# Patient Record
Sex: Male | Born: 1960 | Hispanic: No | Marital: Married | Smoking: Former smoker
Health system: Southern US, Community
[De-identification: ages and names within clinical notes are randomized; demographics above are authoritative.]

## PROBLEM LIST (undated history)

## (undated) ENCOUNTER — Emergency Department (HOSPITAL_COMMUNITY): Admission: EM | Payer: Medicare Other | Source: Home / Self Care

## (undated) ENCOUNTER — Emergency Department (HOSPITAL_COMMUNITY): Admission: EM | Payer: Self-pay | Source: Home / Self Care

## (undated) DIAGNOSIS — D649 Anemia, unspecified: Secondary | ICD-10-CM

## (undated) DIAGNOSIS — B029 Zoster without complications: Secondary | ICD-10-CM

## (undated) DIAGNOSIS — D560 Alpha thalassemia: Secondary | ICD-10-CM

## (undated) DIAGNOSIS — K2970 Gastritis, unspecified, without bleeding: Secondary | ICD-10-CM

## (undated) DIAGNOSIS — K047 Periapical abscess without sinus: Secondary | ICD-10-CM

## (undated) DIAGNOSIS — K219 Gastro-esophageal reflux disease without esophagitis: Secondary | ICD-10-CM

## (undated) DIAGNOSIS — I10 Essential (primary) hypertension: Secondary | ICD-10-CM

## (undated) DIAGNOSIS — I509 Heart failure, unspecified: Secondary | ICD-10-CM

## (undated) HISTORY — PX: NO PAST SURGERIES: SHX2092

## (undated) HISTORY — DX: Anemia, unspecified: D64.9

## (undated) HISTORY — DX: Alpha thalassemia: D56.0

## (undated) HISTORY — DX: Heart failure, unspecified: I50.9

## (undated) HISTORY — DX: Gastro-esophageal reflux disease without esophagitis: K21.9

---
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
## 2000-07-09 ENCOUNTER — Encounter: Payer: Self-pay | Admitting: Hematology & Oncology

## 2000-07-09 ENCOUNTER — Encounter: Admission: RE | Admit: 2000-07-09 | Discharge: 2000-07-09 | Payer: Self-pay | Admitting: Hematology & Oncology

## 2000-09-20 ENCOUNTER — Ambulatory Visit (HOSPITAL_COMMUNITY): Admission: RE | Admit: 2000-09-20 | Discharge: 2000-09-20 | Payer: Self-pay | Admitting: Hematology & Oncology

## 2000-09-20 ENCOUNTER — Encounter: Payer: Self-pay | Admitting: Hematology & Oncology

## 2002-07-21 ENCOUNTER — Emergency Department (HOSPITAL_COMMUNITY): Admission: EM | Admit: 2002-07-21 | Discharge: 2002-07-21 | Payer: Self-pay | Admitting: Emergency Medicine

## 2002-07-22 ENCOUNTER — Encounter: Payer: Self-pay | Admitting: Emergency Medicine

## 2003-07-05 ENCOUNTER — Encounter: Admission: RE | Admit: 2003-07-05 | Discharge: 2003-07-05 | Payer: Self-pay | Admitting: Family Medicine

## 2003-07-05 ENCOUNTER — Encounter: Payer: Self-pay | Admitting: Family Medicine

## 2003-07-09 ENCOUNTER — Encounter: Admission: RE | Admit: 2003-07-09 | Discharge: 2003-10-07 | Payer: Self-pay | Admitting: Family Medicine

## 2008-05-11 IMAGING — CR DG CHEST 2V
2 series · 2 of 2 positions shown · non-contrast
Comparison: None

CLINICAL DATA: Chest pain

CHEST - 2 VIEW

[w chest pa]
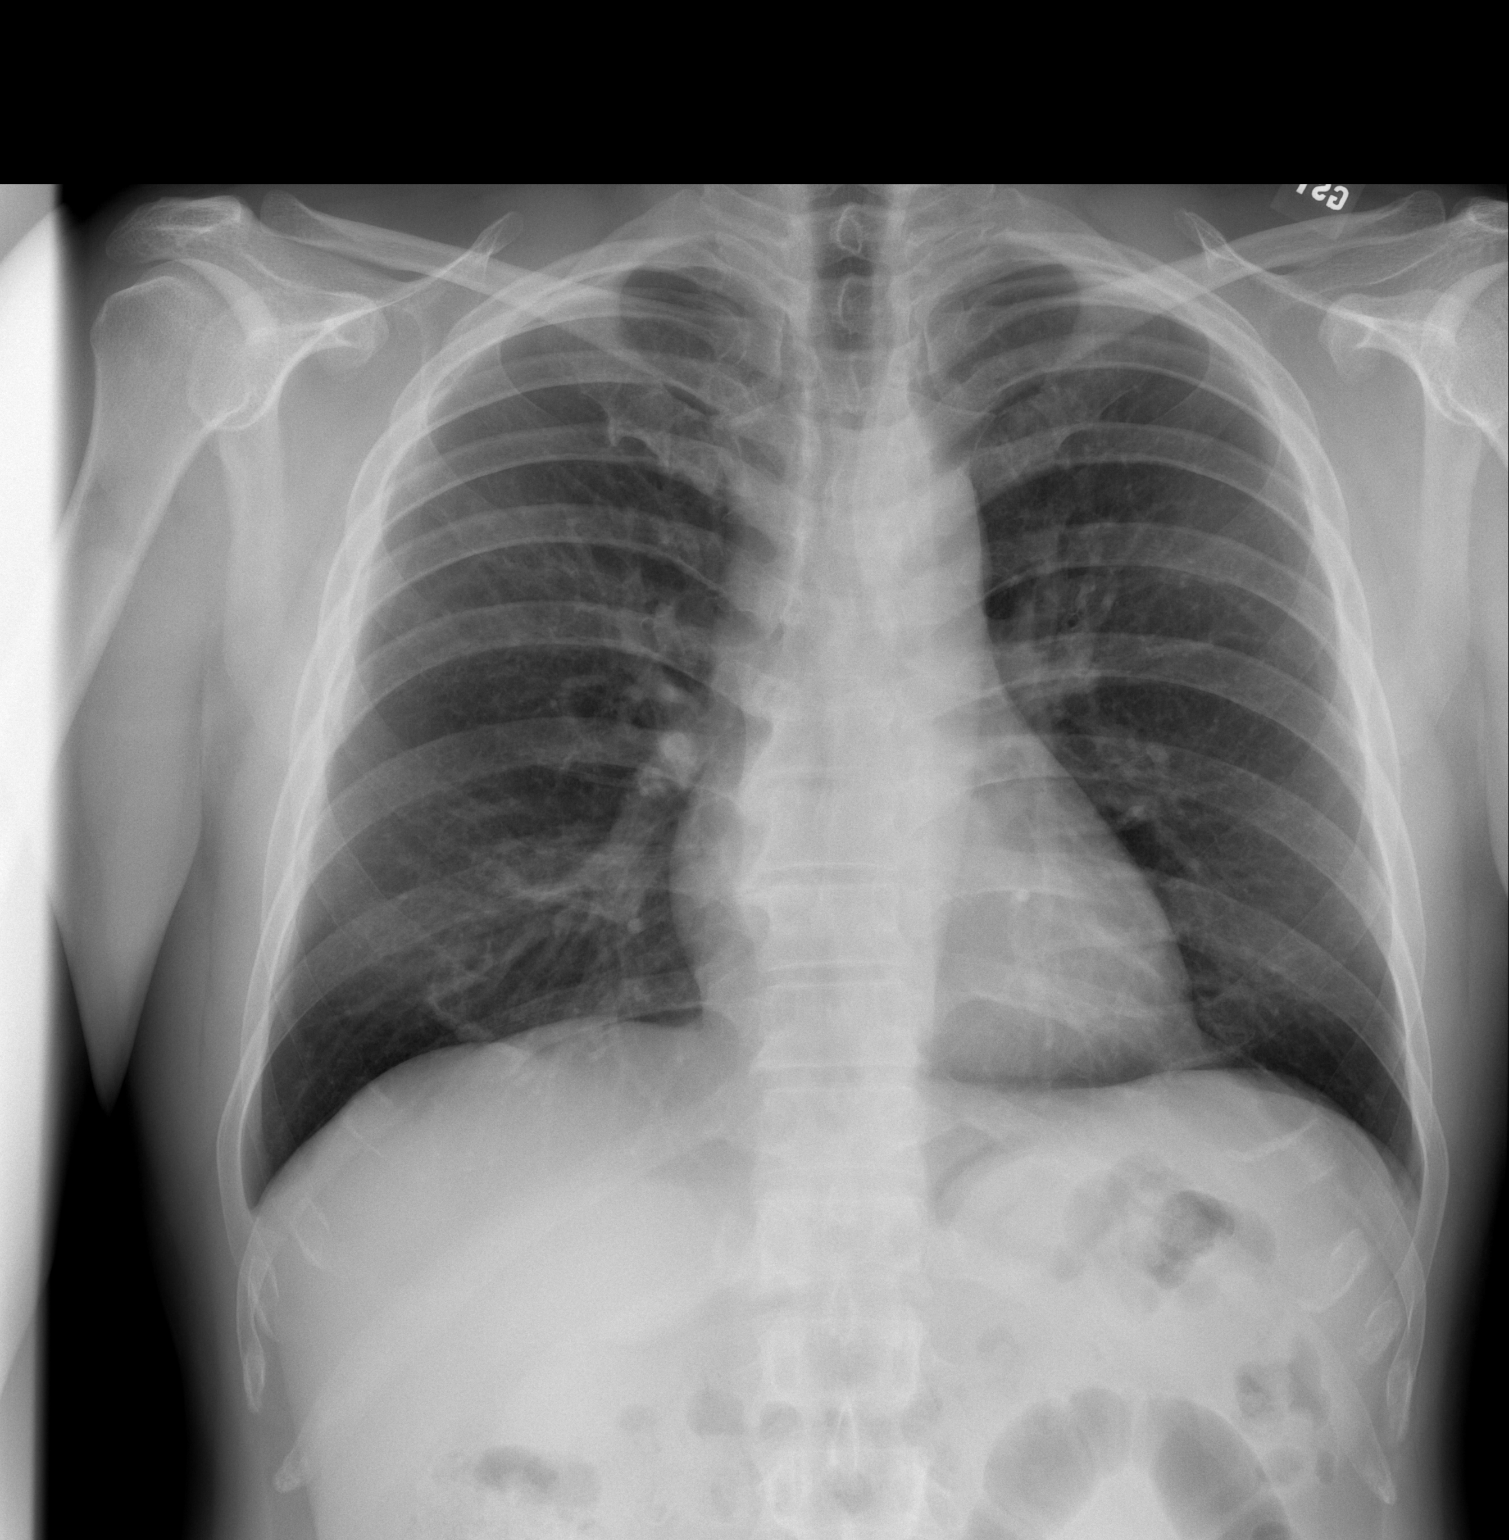

[w chest lat]
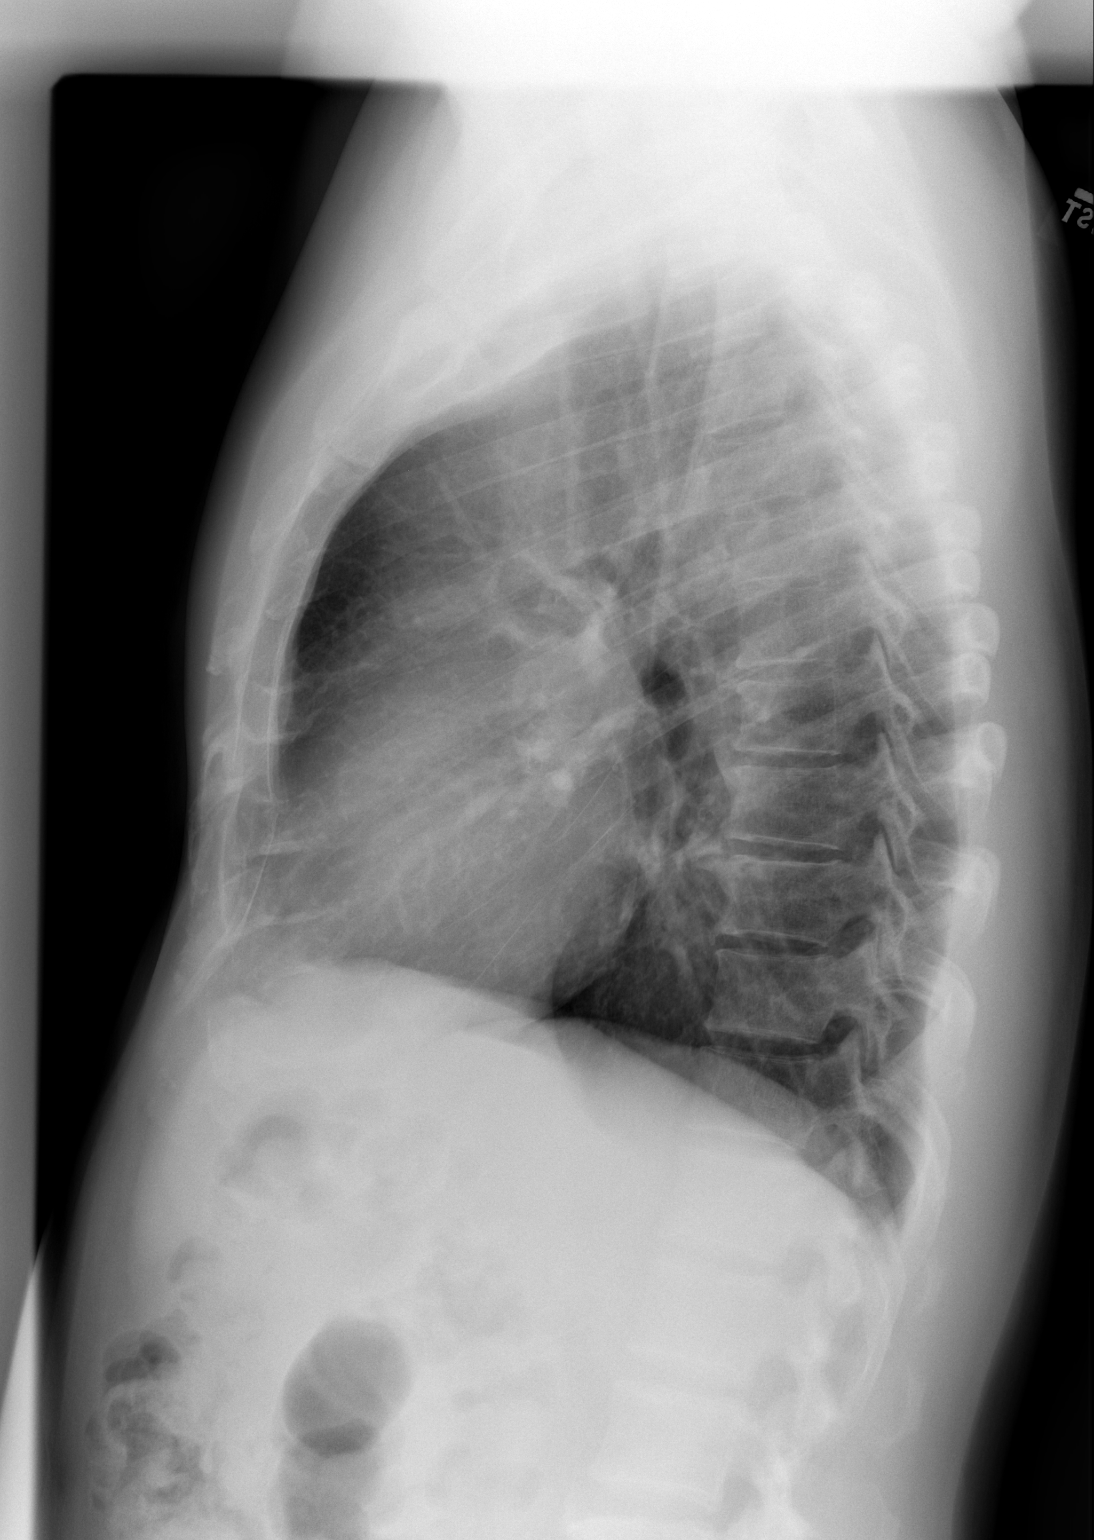

[2 of 2 positions shown; findings below may reference images not displayed]

FINDINGS: The lungs are clear.  The heart is normal.  The
mediastinum and hila are negative for adenopathy.  Mid thoracic
spine degenerative changes are noted.
IMPRESSION: Negative for acute cardiopulmonary process.

## 2008-08-12 ENCOUNTER — Emergency Department (HOSPITAL_COMMUNITY): Admission: EM | Admit: 2008-08-12 | Discharge: 2008-08-13 | Payer: Self-pay | Admitting: Emergency Medicine

## 2008-08-12 IMAGING — CR DG ABDOMEN 1V
1 series · 1 of 1 positions shown · non-contrast
Comparison: None

CLINICAL DATA: Abdominal pain with high blood pressure

ABDOMEN - 1 VIEW

[t abdomen supine]
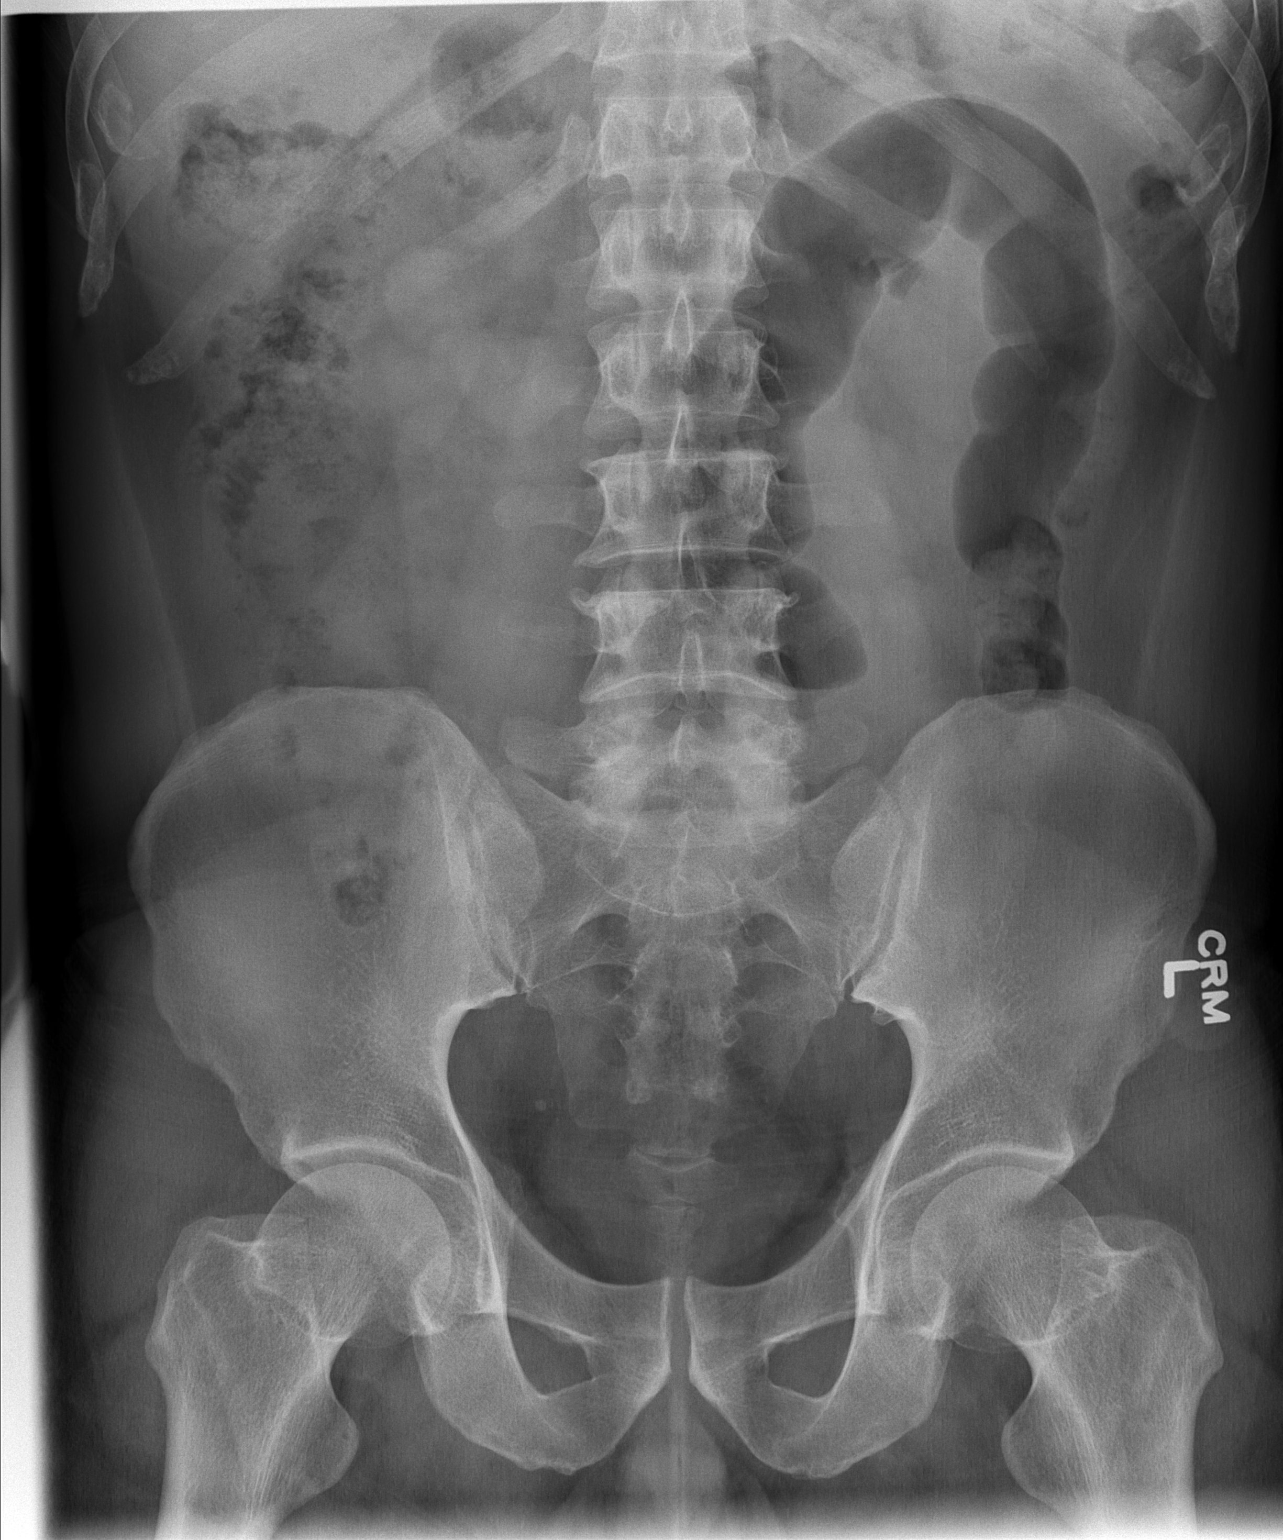

[1 of 1 positions shown; findings below may reference images not displayed]

FINDINGS: Nonobstructive bowel gas.  Calcification of the right
hemipelvis is likely a phlebolith.  Degenerative changes noted
within the lower lumbar spine, and the hips bilaterally
IMPRESSION: Normal bowel gas.

## 2010-08-22 ENCOUNTER — Emergency Department (HOSPITAL_COMMUNITY): Admission: EM | Admit: 2010-08-22 | Discharge: 2010-08-22 | Payer: Self-pay | Admitting: Emergency Medicine

## 2010-08-28 ENCOUNTER — Emergency Department (HOSPITAL_COMMUNITY): Admission: EM | Admit: 2010-08-28 | Discharge: 2010-08-28 | Payer: Self-pay | Admitting: Emergency Medicine

## 2010-08-28 IMAGING — US US ABDOMEN COMPLETE
1 series · 14 of 25 positions shown · non-contrast
Comparison: None.

CLINICAL DATA: Abdominal pain, vomiting

COMPLETE ABDOMINAL ULTRASOUND

[Series 1: us abdomen complete · 0.30mm/px · 14 of 76 slices shown]
[im 1/76]
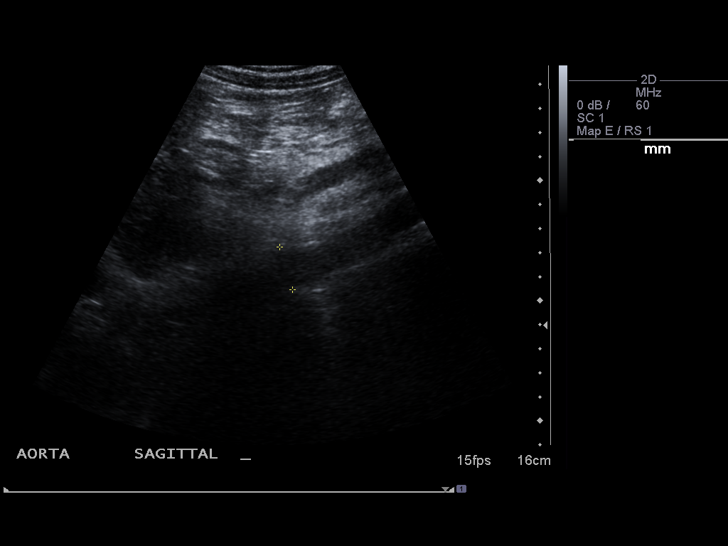
[im 7/76]
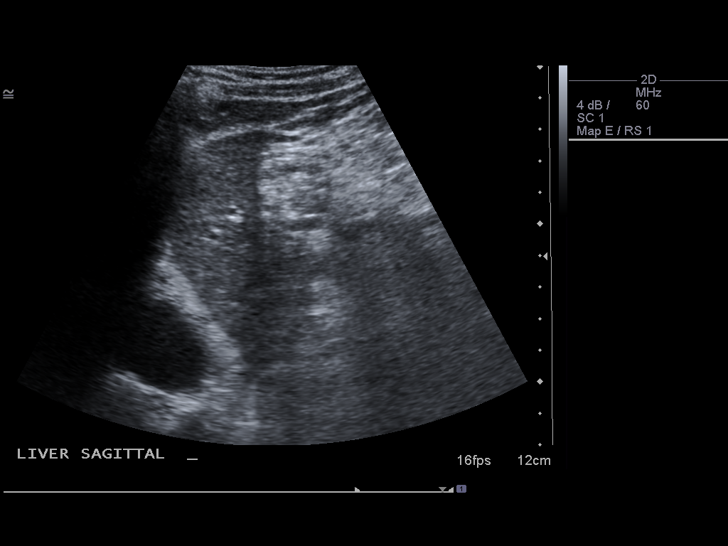
[im 13/76]
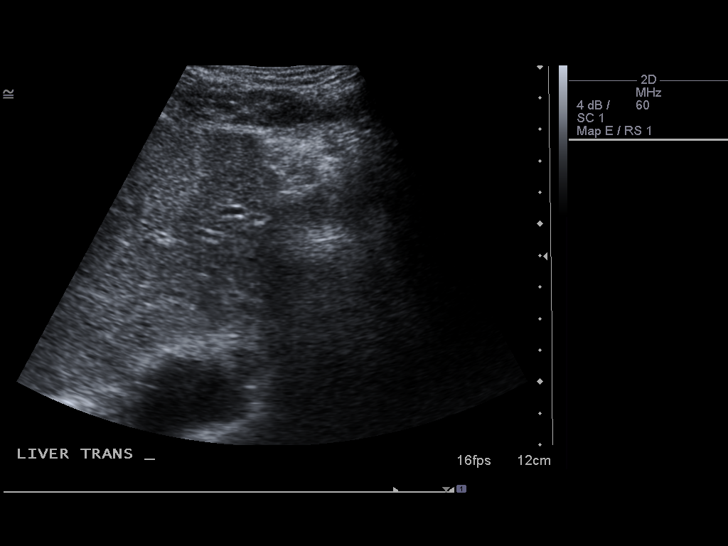
[im 19/76]
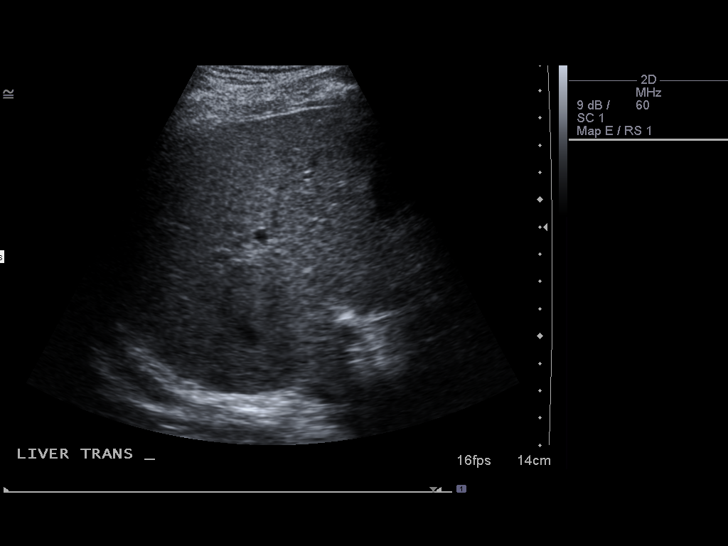
[im 26/76]
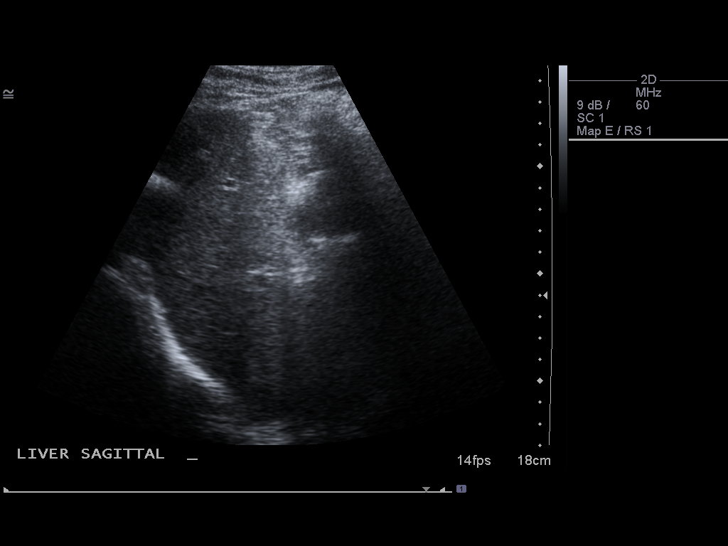
[im 29/76]
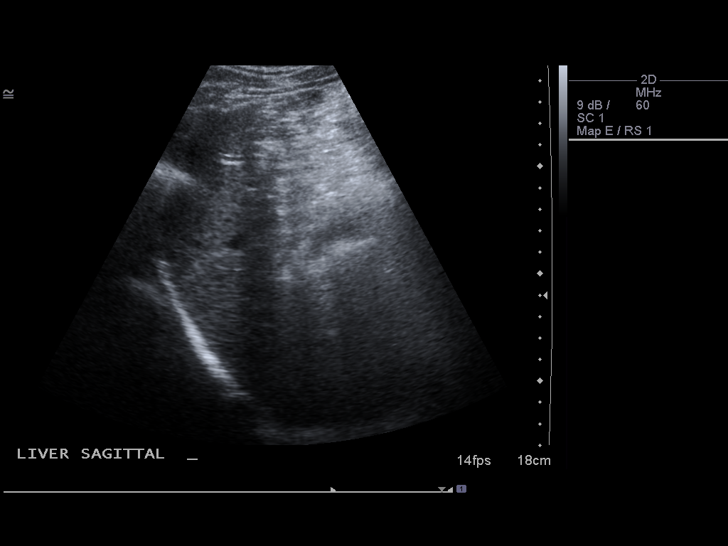
[im 35/76]
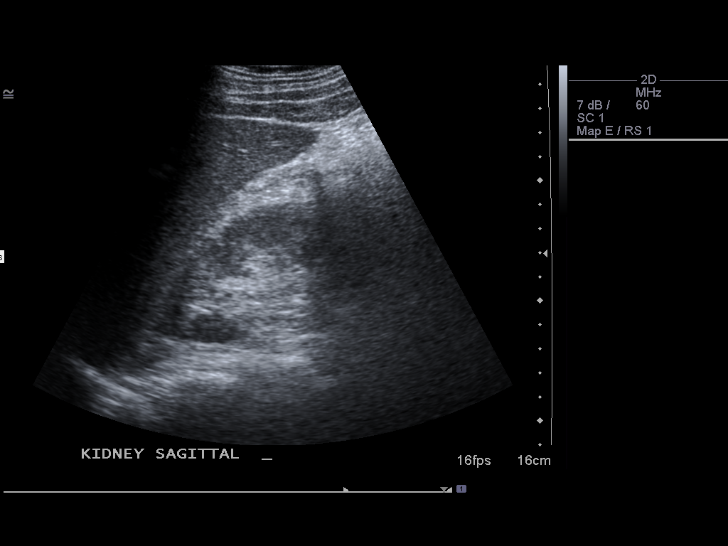
[im 41/76]
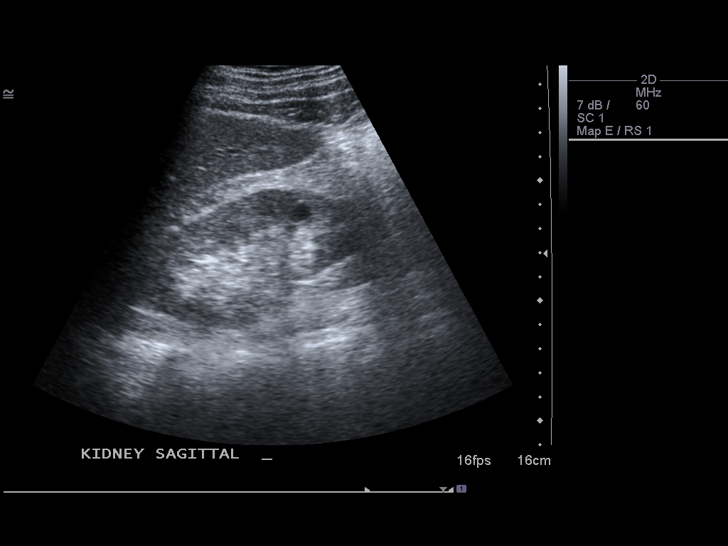
[im 47/76]
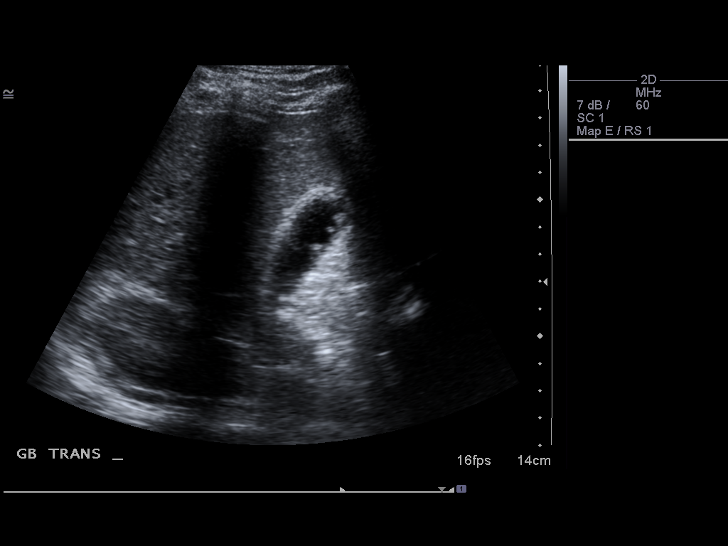
[im 51/76]
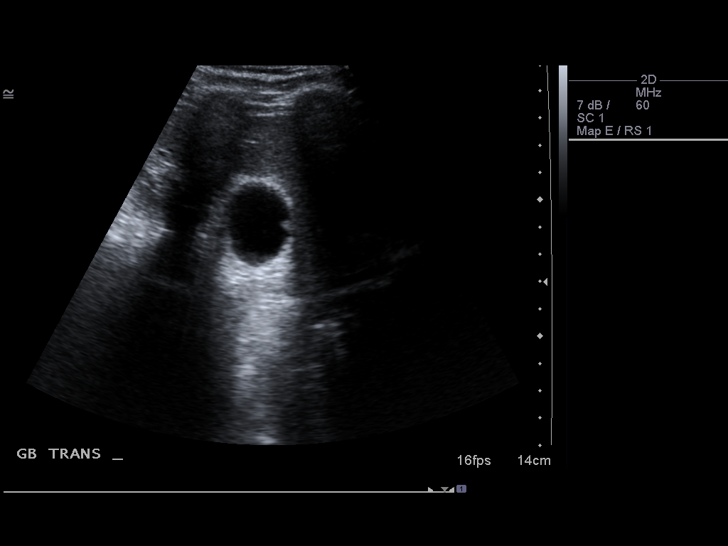
[im 57/76]
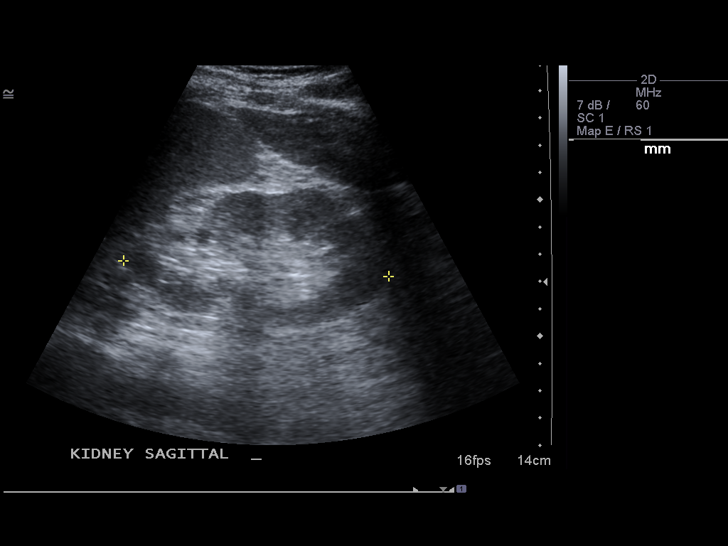
[im 63/76]
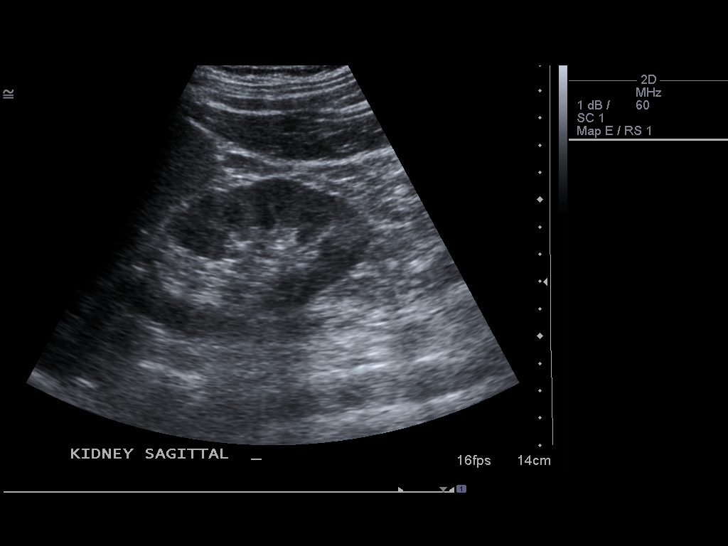
[im 69/76]
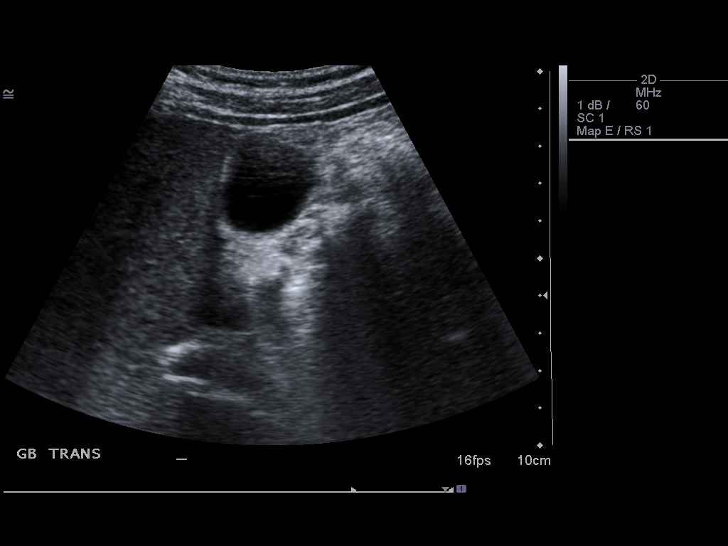
[im 76/76]
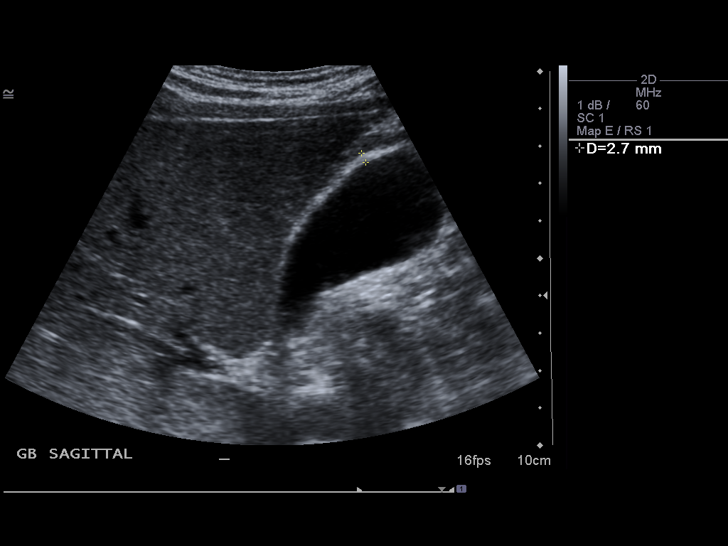

[14 of 25 positions shown; findings below may reference images not displayed]

FINDINGS: Gallbladder:  There are multiple gallbladder polyps present.  No
definite gallstones are seen and no gallbladder wall thickening or
pain is present.

Common bile duct:  The common bile duct is normal measuring 2.1 mm
in diameter.

Liver:  The liver has a normal echogenic pattern.  No ductal
dilatation is seen.

IVC:  Appears normal.

Pancreas:  No focal abnormality seen.

Spleen:  The spleen is normal measuring 7.0 cm sagittally.

Right Kidney:  No hydronephrosis is seen.  The right kidney
measures 10.0 cm sagittally.  A small cyst is noted of no more than
10 mm in diameter.

Left Kidney:  No hydronephrosis.  The left kidney measures 9.7 cm.

Abdominal aorta:  The abdominal aorta is normal in caliber.
IMPRESSION: 1.  No gallstones.  Multiple gallbladder polyps.
2.  No ductal dilatation.

## 2011-02-26 LAB — POCT CARDIAC MARKERS
CKMB, poc: 5.1 ng/mL (ref 1.0–8.0)
Myoglobin, poc: 138 ng/mL (ref 12–200)
Troponin i, poc: <0.05 ng/mL (ref 0.00–0.09)

## 2011-02-26 LAB — POCT I-STAT, CHEM 8
BUN: 19 mg/dL (ref 6–23)
Calcium, Ion: 1.14 mmol/L (ref 1.12–1.32)
Chloride: 108 meq/L (ref 96–112)
Creatinine, Ser: 1 mg/dL (ref 0.4–1.5)
Glucose, Bld: 107 mg/dL — ABNORMAL HIGH (ref 70–99)
HCT: 44 % (ref 39.0–52.0)
Hemoglobin: 15 g/dL (ref 13.0–17.0)
Potassium: 3.7 meq/L (ref 3.5–5.1)
Sodium: 139 meq/L (ref 135–145)
TCO2: 23 mmol/L (ref 0–100)

## 2011-02-26 LAB — URINALYSIS, ROUTINE W REFLEX MICROSCOPIC
Bilirubin Urine: NEGATIVE
Ketones, ur: NEGATIVE mg/dL
Nitrite: NEGATIVE
Protein, ur: NEGATIVE mg/dL
Urobilinogen, UA: 0.2 mg/dL (ref 0.0–1.0)

## 2011-02-26 LAB — COMPREHENSIVE METABOLIC PANEL
AST: 31 U/L (ref 0–37)
Albumin: 4 g/dL (ref 3.5–5.2)
Albumin: 3.3 g/dL — ABNORMAL LOW (ref 3.5–5.2)
Alkaline Phosphatase: 68 U/L (ref 39–117)
BUN: 11 mg/dL (ref 6–23)
BUN: 15 mg/dL (ref 6–23)
Calcium: 8.5 mg/dL (ref 8.4–10.5)
Chloride: 104 mEq/L (ref 96–112)
Creatinine, Ser: 0.98 mg/dL (ref 0.4–1.5)
Creatinine, Ser: 1.17 mg/dL (ref 0.4–1.5)
GFR calc Af Amer: >60 mL/min (ref >60)
Glucose, Bld: 147 mg/dL — ABNORMAL HIGH (ref 70–99)
Potassium: 4 mEq/L (ref 3.5–5.1)
Potassium: 3.9 mEq/L (ref 3.5–5.1)
Total Bilirubin: 0.9 mg/dL (ref 0.3–1.2)
Total Protein: 7.5 g/dL (ref 6.0–8.3)
Total Protein: 6.9 g/dL (ref 6.0–8.3)

## 2011-02-26 LAB — CBC
Platelets: 327 10*3/uL (ref 150–400)
RBC: 5.46 MIL/uL (ref 4.22–5.81)
RDW: 14.5 % (ref 11.5–15.5)
WBC: 14.7 10*3/uL — ABNORMAL HIGH (ref 4.0–10.5)

## 2011-02-26 LAB — PROTIME-INR
INR: 0.95 (ref 0.00–1.49)
Prothrombin Time: 12.9 seconds (ref 11.6–15.2)

## 2011-02-26 LAB — DIFFERENTIAL
Basophils Relative: 0 % (ref 0–1)
Eosinophils Absolute: 0.1 10*3/uL (ref 0.0–0.7)
Eosinophils Relative: 1 % (ref 0–5)
Lymphocytes Relative: 6 % — ABNORMAL LOW (ref 12–46)
Neutro Abs: 12.8 10*3/uL — ABNORMAL HIGH (ref 1.7–7.7)
Neutrophils Relative %: 87 % — ABNORMAL HIGH (ref 43–77)

## 2011-02-26 LAB — LACTIC ACID, PLASMA: Lactic Acid, Venous: 2.5 mmol/L — ABNORMAL HIGH (ref 0.5–2.2)

## 2011-02-26 LAB — LIPASE, BLOOD
Lipase: 43 U/L (ref 11–59)
Lipase: 20 U/L (ref 11–59)

## 2013-10-04 ENCOUNTER — Encounter (HOSPITAL_COMMUNITY): Payer: Self-pay | Admitting: Emergency Medicine

## 2013-10-04 ENCOUNTER — Emergency Department (HOSPITAL_COMMUNITY): Payer: No Typology Code available for payment source

## 2013-10-04 ENCOUNTER — Emergency Department (HOSPITAL_COMMUNITY)
Admission: EM | Admit: 2013-10-04 | Discharge: 2013-10-04 | Disposition: A | Discharge status: Home / Self Care | Payer: No Typology Code available for payment source | Attending: Emergency Medicine | Admitting: Emergency Medicine

## 2013-10-04 DIAGNOSIS — R51 Headache: Secondary | ICD-10-CM | POA: Insufficient documentation

## 2013-10-04 DIAGNOSIS — G8921 Chronic pain due to trauma: Secondary | ICD-10-CM | POA: Insufficient documentation

## 2013-10-04 DIAGNOSIS — R079 Chest pain, unspecified: Secondary | ICD-10-CM | POA: Insufficient documentation

## 2013-10-04 LAB — CBC
MCH: 22.3 pg — ABNORMAL LOW (ref 26.0–34.0)
MCHC: 33.6 g/dL (ref 30.0–36.0)
Platelets: 375 10*3/uL (ref 150–400)
RBC: 5.46 MIL/uL (ref 4.22–5.81)

## 2013-10-04 LAB — BASIC METABOLIC PANEL
BUN: 16 mg/dL (ref 6–23)
Calcium: 9 mg/dL (ref 8.4–10.5)
GFR calc Af Amer: >90 mL/min (ref >90)
GFR calc non Af Amer: >90 mL/min (ref >90)
Glucose, Bld: 122 mg/dL — ABNORMAL HIGH (ref 70–99)
Sodium: 136 mEq/L (ref 135–145)

## 2013-10-04 LAB — POCT I-STAT TROPONIN I
Troponin i, poc: 0 ng/mL (ref 0.00–0.08)
Troponin i, poc: 0 ng/mL (ref 0.00–0.08)

## 2013-10-04 IMAGING — CR DG CHEST 2V
2 series · 2 of 2 positions shown · non-contrast
Comparison: PA and lateral chest [DATE].

CLINICAL DATA: Chest pain and cough for 2 months.

EXAM:
CHEST  2 VIEW

[w chest pa]
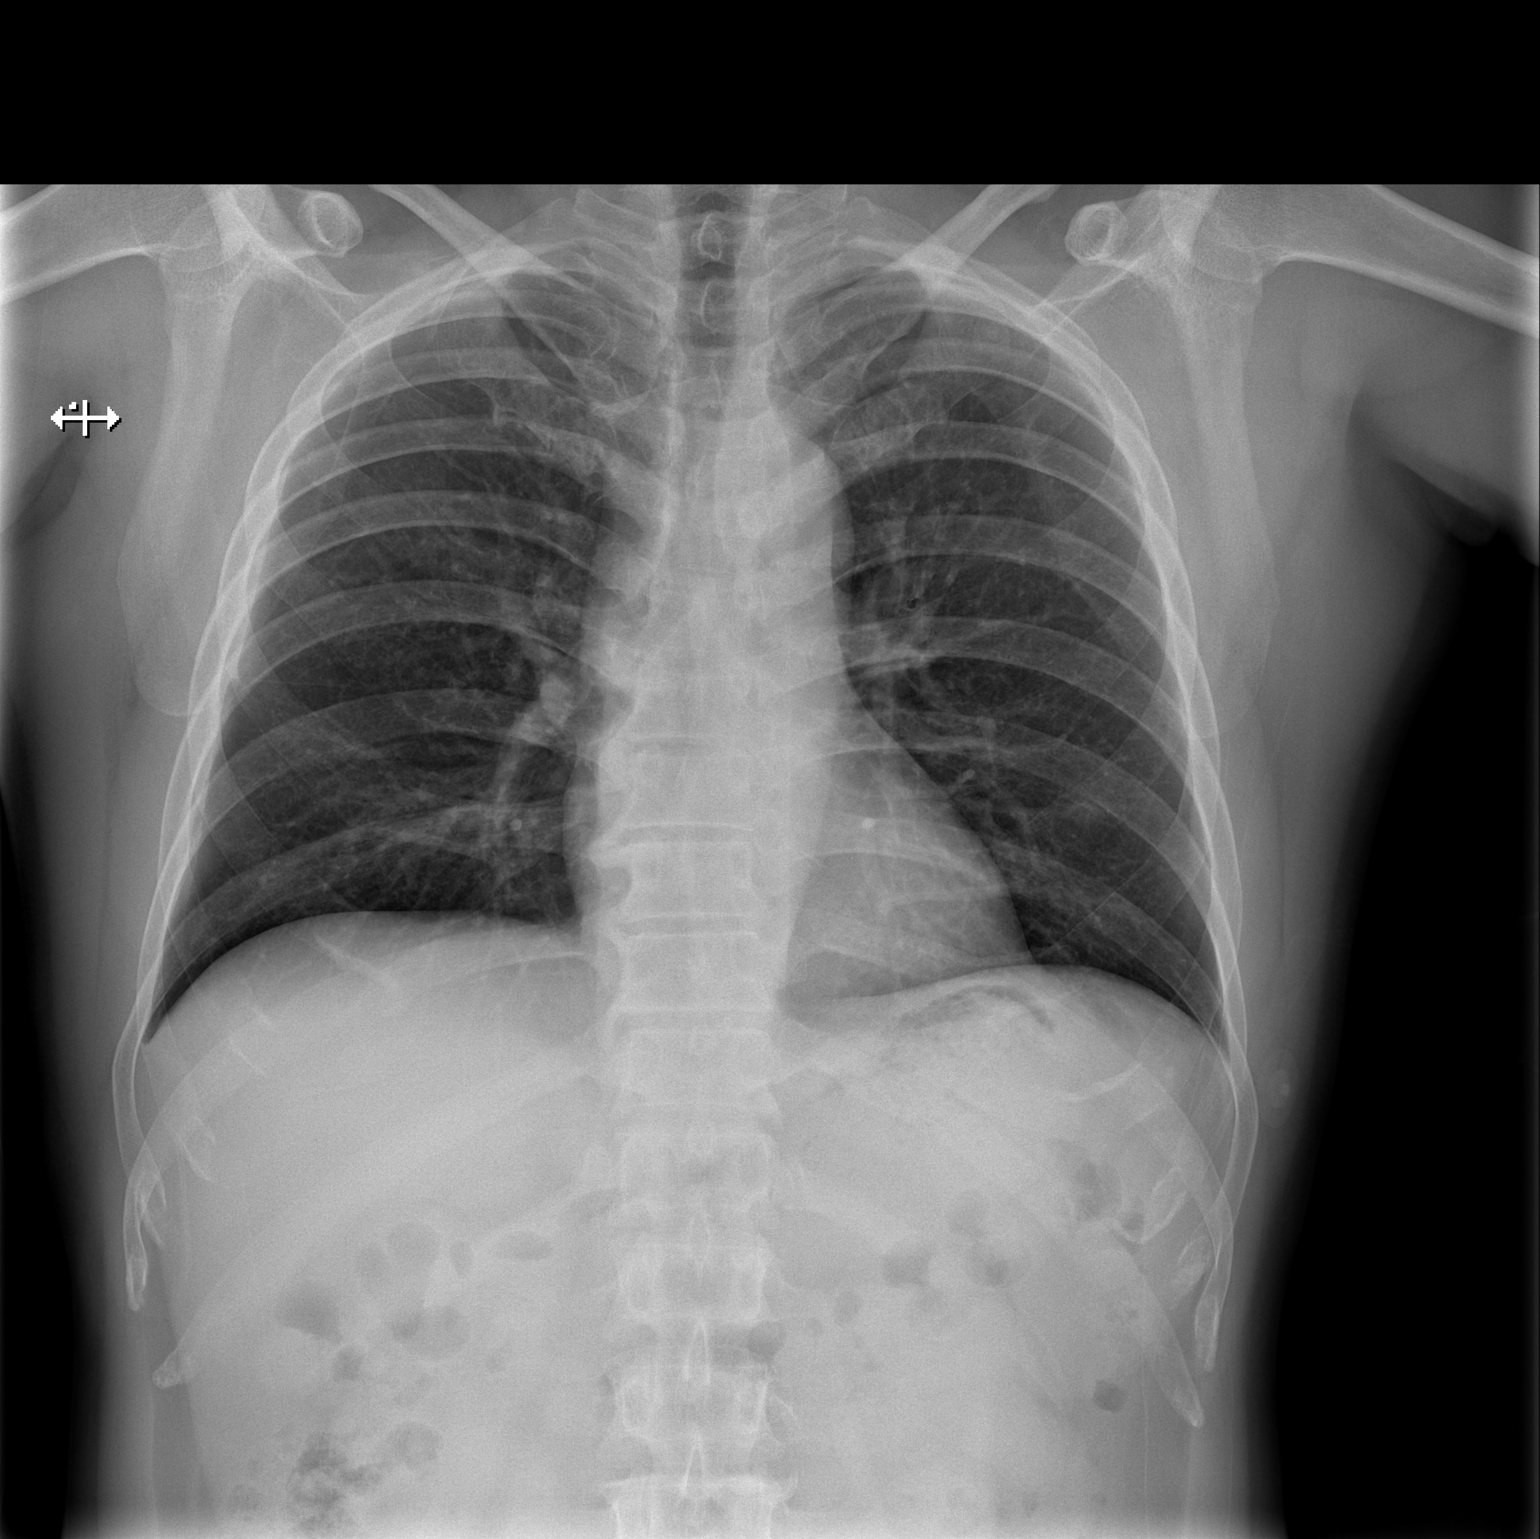

[w chest lat]
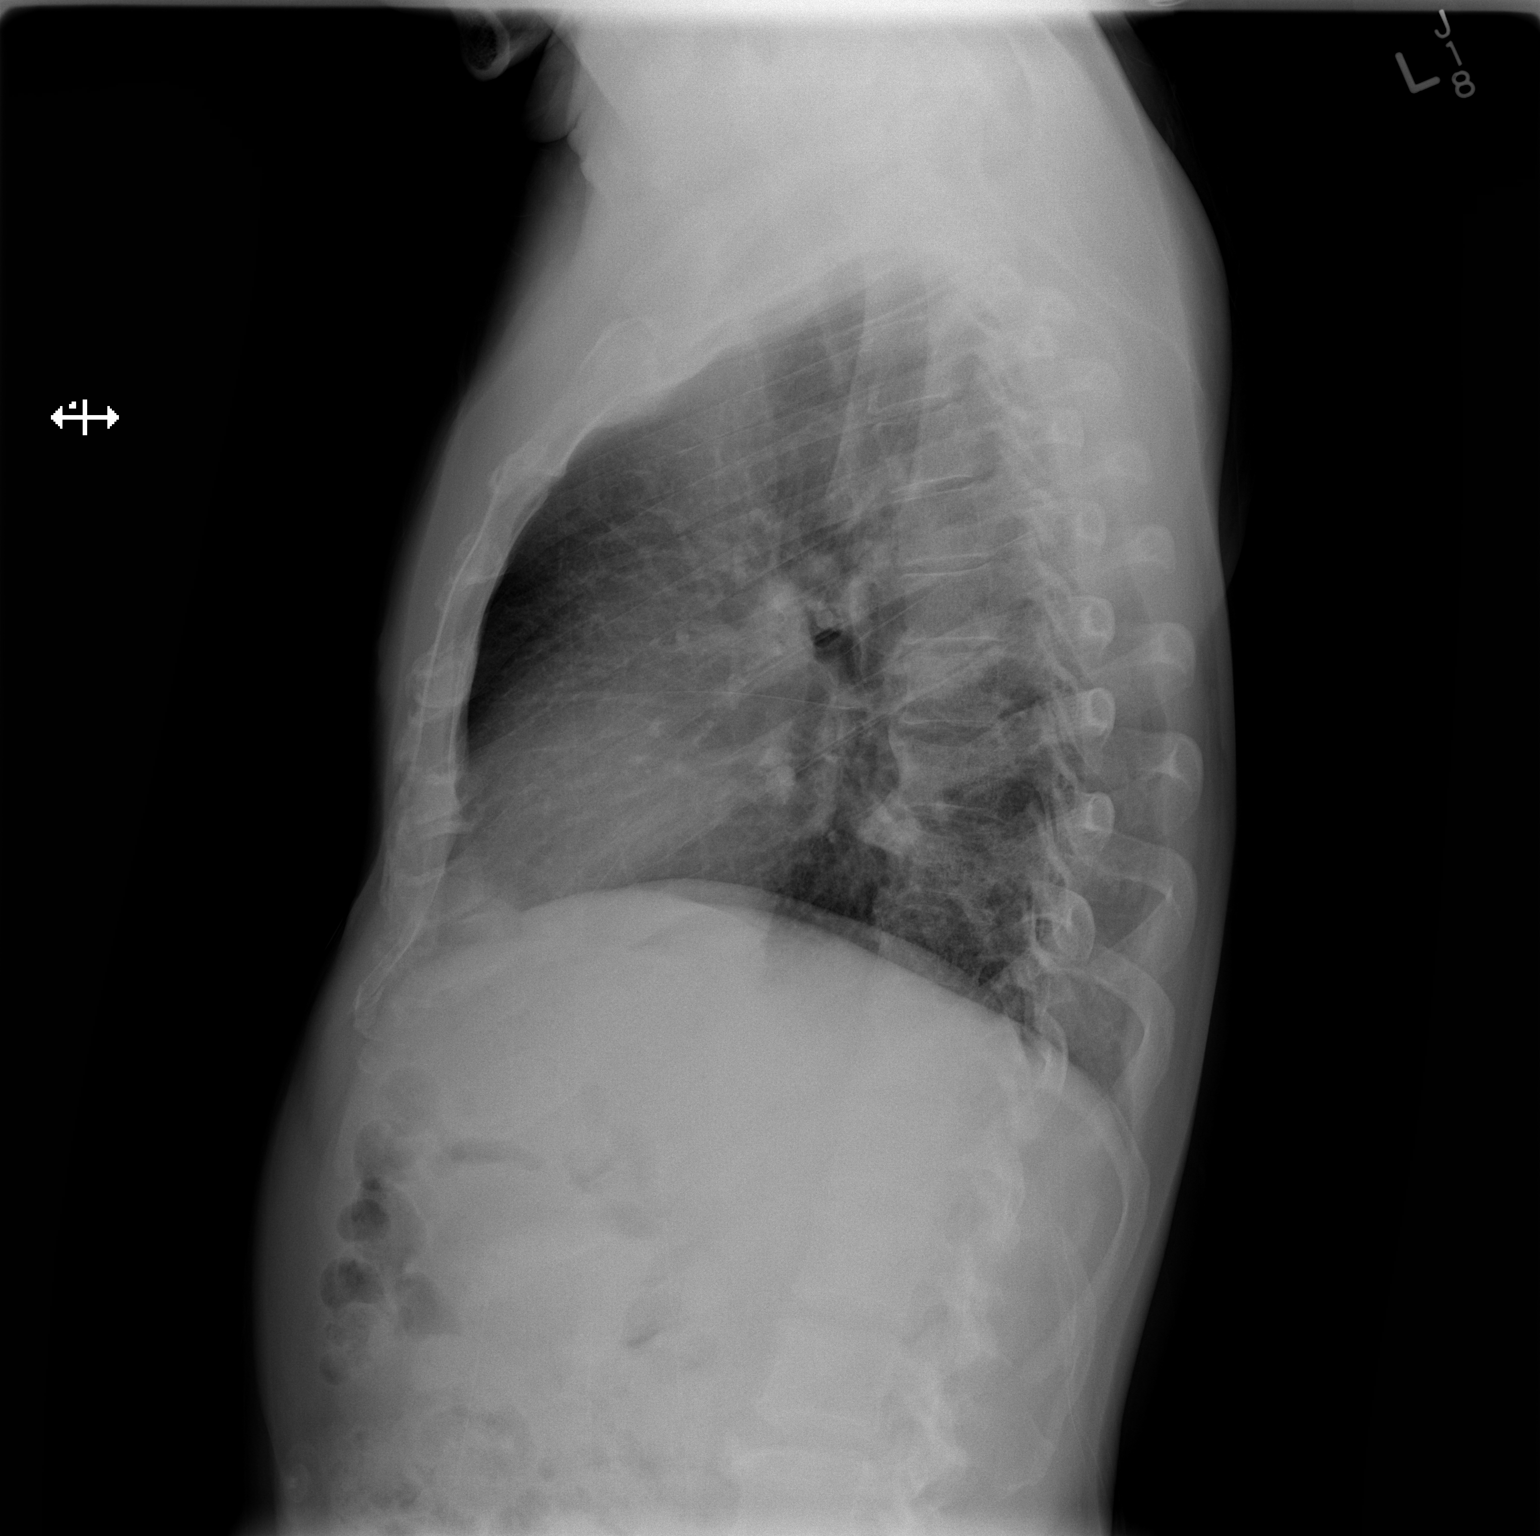

[2 of 2 positions shown; findings below may reference images not displayed]

FINDINGS: Heart size and mediastinal contours are within normal limits. Both
lungs are clear. Visualized skeletal structures are unremarkable.
IMPRESSION: Negative exam.

## 2013-10-04 MED ORDER — ACETAMINOPHEN 500 MG PO TABS
1000.0000 mg | ORAL_TABLET | Freq: Once | ORAL | Status: AC
  Administered 2013-10-04: 1000 mg via ORAL
  Filled 2013-10-04: qty 2

## 2013-10-04 MED ORDER — KETOROLAC TROMETHAMINE 30 MG/ML IJ SOLN
30.0000 mg | Freq: Once | INTRAMUSCULAR | Status: AC
  Administered 2013-10-04: 30 mg via INTRAVENOUS
  Filled 2013-10-04: qty 1

## 2013-10-04 MED ORDER — TRAMADOL HCL 50 MG PO TABS: 50.0000 mg | ORAL_TABLET | Freq: Four times a day (QID) | ORAL | Status: DC | PRN

## 2013-10-04 NOTE — ED Notes (Signed)
Pt brought back to room from triage via wheelchair; pt undressed, in gown, on monitor, continuous pulse oximetry and blood pressure cuff
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
## 2013-10-04 NOTE — Discharge Instructions (Signed)
It is very important that you obtain a primary care physician for further evaluation and management of your pain.  Please use the provided resources to obtain a follow-up visit.         RESOURCE GUIDE  Chronic Pain Problems: Contact [facility]  [phone] Patients need to be referred by their primary care doctor.  Insufficient Money for Medicine: Contact United Way:  call [phone]  No Primary Care Doctor: - Call Health Connect  [phone] - can help you locate a primary care doctor that  accepts your insurance, provides certain services, etc. - Physician Referral Service- [phone]  Agencies that provide inexpensive medical care: - [facility]  [phone] - [facility]  [phone] - [facility]  [phone] - Women's Clinic  [phone] - Planned Parenthood  [phone] - [facility]  [phone]  Medicaid-accepting [facility] Providers: - [facility]- [address]  [phone], Mon-Fri 9am-7pm, Sat 9am-1pm - [facility]- [address] [place], Suite [place]  [phone] - [facility] [place]- [address] [place]  [phone] [facility]- [address]  [phone] - Renaye Rakers- [address] [place], [address], [phone]  Only accepts [place] patients after they have their name  applied to their card  Self Pay (no insurance) in [facility]: - Sickle Cell Patients: [facility]  [address] [place], [phone] - [facility]- [address] [place]  [phone]       [facility] [place]- [address]       -     [facility]- see information above (Speak to [employer] if you do not have insurance)       -  [facility]- [address],  [phone]       -  [facility]- [address], [phone]       -  Dr Julio Sicks-   [address], [place], [phone]       -  [facility] - [address] [phone]       -  [facility]- [address], [phone], also [address], [phone]       -    [facility]- [address] [place], [phone], 1st & 3rd Saturday        every month, 10am-1pm  -    [facility]   [address], [place]   Phone:  [phone], Fax:  [phone].  Hours of Operation:  9 am - 6 pm, M-F.  -    [facility]   [address], [place]   Phone: [phone], Fax: [phone]. Hours of Operation:  8:30 am - 5:30 pm, M-F.  [facility] [place] [address] [place], [place] [address] [phone]  [facility] [address] [place], [place] [address] [phone]  1) Find a Doctor and Pay Out of Pocket Although you won't have to find out who is covered by your insurance plan, it is a good idea to ask around and get recommendations. You will then need to call the office and see if the doctor you have chosen will accept you as  a new patient and what types of options they offer for patients who are self-pay. Some doctors offer discounts or will set up payment plans for their patients who do not have insurance, but you will need to ask so you aren't surprised when you get to your appointment.  2) Contact Your Local Health Department Not all health departments have doctors that can see patients for sick visits, but many do, so it is worth a call to see if yours does. If you don't know where your local health department is, you can check in your phone book. The CDC also has a tool to help you locate your state's health department, and many state websites also have listings of all of their local health departments.  3) Find a Walk-in Clinic If your illness is not likely to be very severe or complicated, you may want to try a walk in clinic.  These are popping up all over the country in pharmacies, drugstores, and shopping centers. They're usually staffed by nurse practitioners or physician assistants that have been trained to treat common illnesses and complaints. They're usually fairly quick and inexpensive. However, if you have serious medical issues or chronic medical problems, these are probably not your best option  STD Testing - [facility] Department of [facility], STD Clinic, [address] [place], phone [phone] or [phone].  Monday - Friday, call for an appointment. [facility] Department of [employer], STD Clinic, [address], [place], phone [phone] or [phone].  Monday - Friday, call for an appointment.  Abuse/Neglect: [facility] Child Abuse Hotline [phone] [facility] Child Abuse Hotline [phone] (After Hours)  Emergency Shelter:  [facility] [phone]  Maternity Homes: - [facility] at the [place] [facility] [phone] - [facility] [phone]  MRSA Hotline #:   212-008-9975  Dental Assistance  If unable to pay or uninsured, contact:  [facility]. to become qualified for the adult dental clinic.  Patients with Medicaid: [facility] [phone] [address], [phone] [address], [phone]  If unable to pay, or uninsured, contact [facility] [phone] in [place], [phone] in [facility]) to become qualified for the adult dental clinic  [facility] [address] [place] Phone: [phone] [email or web address]  Other Proofreader Services: - [facility]- [address] [place], [place], [place], [address], [phone], Ext. [phone], 2nd and 4th Thursday of the month at 6:30am.  10 clients each day by appointment, can sometimes see walk-in patients if someone does not show for an appointment. [facility]- [address] [place], [place], [address], [phone] - [facility] [address] [place], [place], [address], [phone] - [facility]- [phone] [facility] Health Department- [phone] [facility] Health [phone]       Behavioral Health Resources in the [facility]  Intensive Outpatient Programs: [facility]      [address] [place], [place] [phone] Both a day and evening program       [facility] Outpatient     [address]        [place], [place] [address] [phone]         ADS: [facility] [address] [place] [phone]  [facility] Mental Health ACCESS LINE: [phone] or [phone]  [address] [place], [place] [address] [email or web address]  Behavioral Health Services  Substance Abuse Resources: - [facility]  6121226255 - [facility] [phone] - [facility] [phone] Floydene Flock [phone] - Residential & Outpatient Substance Abuse Program  [phone]  Psychological Services: [facility]  [phone] [facility]  [phone] - [facility], [phone] [place]. [address], [place], ACCESS LINE: [phone] or [phone], [email or web address]  Mobile Crisis Teams:                                        Therapeutic Alternatives         Mobile Crisis Care Unit [phone]             [facility] [address] [place]ette Otto [phone]                                         Interventionist [address] [place] [phone]  Self-Help/Support Groups: Mental Health Assoc. of [employer] of support groups 832-102-5211 (call for more info)  Narcotics Anonymous (NA) Caring Services [address] [place] - 2 meetings at this location  Residential Treatment Programs:  [facility]      [address]        [place]       [phone]         [facility] [address] [place] [address]18 [place], [place]  [address] [phone]  [facility]  [address] [place], [place] [address] [phone] Admissions: 8am-3pm M-F  [facility]     [address]        [place], [place] [address]       [phone]         [facility] [address] [place], [place] [phone]  [facility] [address] [place], [place] [phone]  [facility] - Intensive Outpatient      [address]     [place], [place]       [phone]         [facility] (Addiction Recovery Care [facility]     [address] [place], [place] [phone] or [phone]  Residential Treatment Services (RTS), Medicaid [address] [place], [place] [phone]  [facility] [address]                                               [address] [place] [phone]  [facility] Resources: [phone]               General Therapy                                                Angie Fava, PhD        [phone]  [place], [place] [address]         [phone]   Insurance  [facility]   [address] [place], [place] [address] [phone]  [facility] [address] [place], [place] [address] [phone] Insurance/Medicaid/sponsorship through [facility] and Families                                              [address]                                        [place], [place] [address]    Therapy/tele-psych/case         [phone]          [facility] [address], [place]  [address]  Adolescent/group home/case  management [phone]                                           Creola Corn PhD       General therapy       Insurance   [phone]         Dr. Lolly Mustache, Insurance, M-F [phone]  [facility]  [employer] [facility] Dept. [address]                 [address]         [address] [place] [address]  [place]ondell Reveal Phone:  [phone]                                  Phone:  [phone]                   Phone:  [phone]  [facility] Mental Health, [phone] - [facility] - [facility]- [phone]       -     [facility] in [place], [address]             [phone], Insurance  [facility] Child Abuse Hotline [phone] 119-7774 or [phone] (After Hours)

## 2013-10-04 NOTE — ED Notes (Signed)
MD updated pt on plan of care

## 2014-02-06 ENCOUNTER — Ambulatory Visit
Admission: RE | Admit: 2014-02-06 | Discharge: 2014-02-06 | Disposition: A | Discharge status: Home / Self Care | Payer: BC Managed Care – PPO | Source: Ambulatory Visit | Attending: Cardiovascular Disease | Admitting: Cardiovascular Disease

## 2014-02-06 ENCOUNTER — Other Ambulatory Visit: Payer: Self-pay | Admitting: Cardiovascular Disease

## 2014-02-06 DIAGNOSIS — R05 Cough: Secondary | ICD-10-CM

## 2014-02-06 DIAGNOSIS — R059 Cough, unspecified: Secondary | ICD-10-CM

## 2014-02-06 IMAGING — CR DG CHEST 2V
2 series · 2 of 2 positions shown · non-contrast
Comparison: DG CHEST 2 VIEW dated [DATE]

CLINICAL DATA: DG CHEST 2 VIEW dated [DATE]Cough, congestion

EXAM:
CHEST  2 VIEW

[w chest pa]
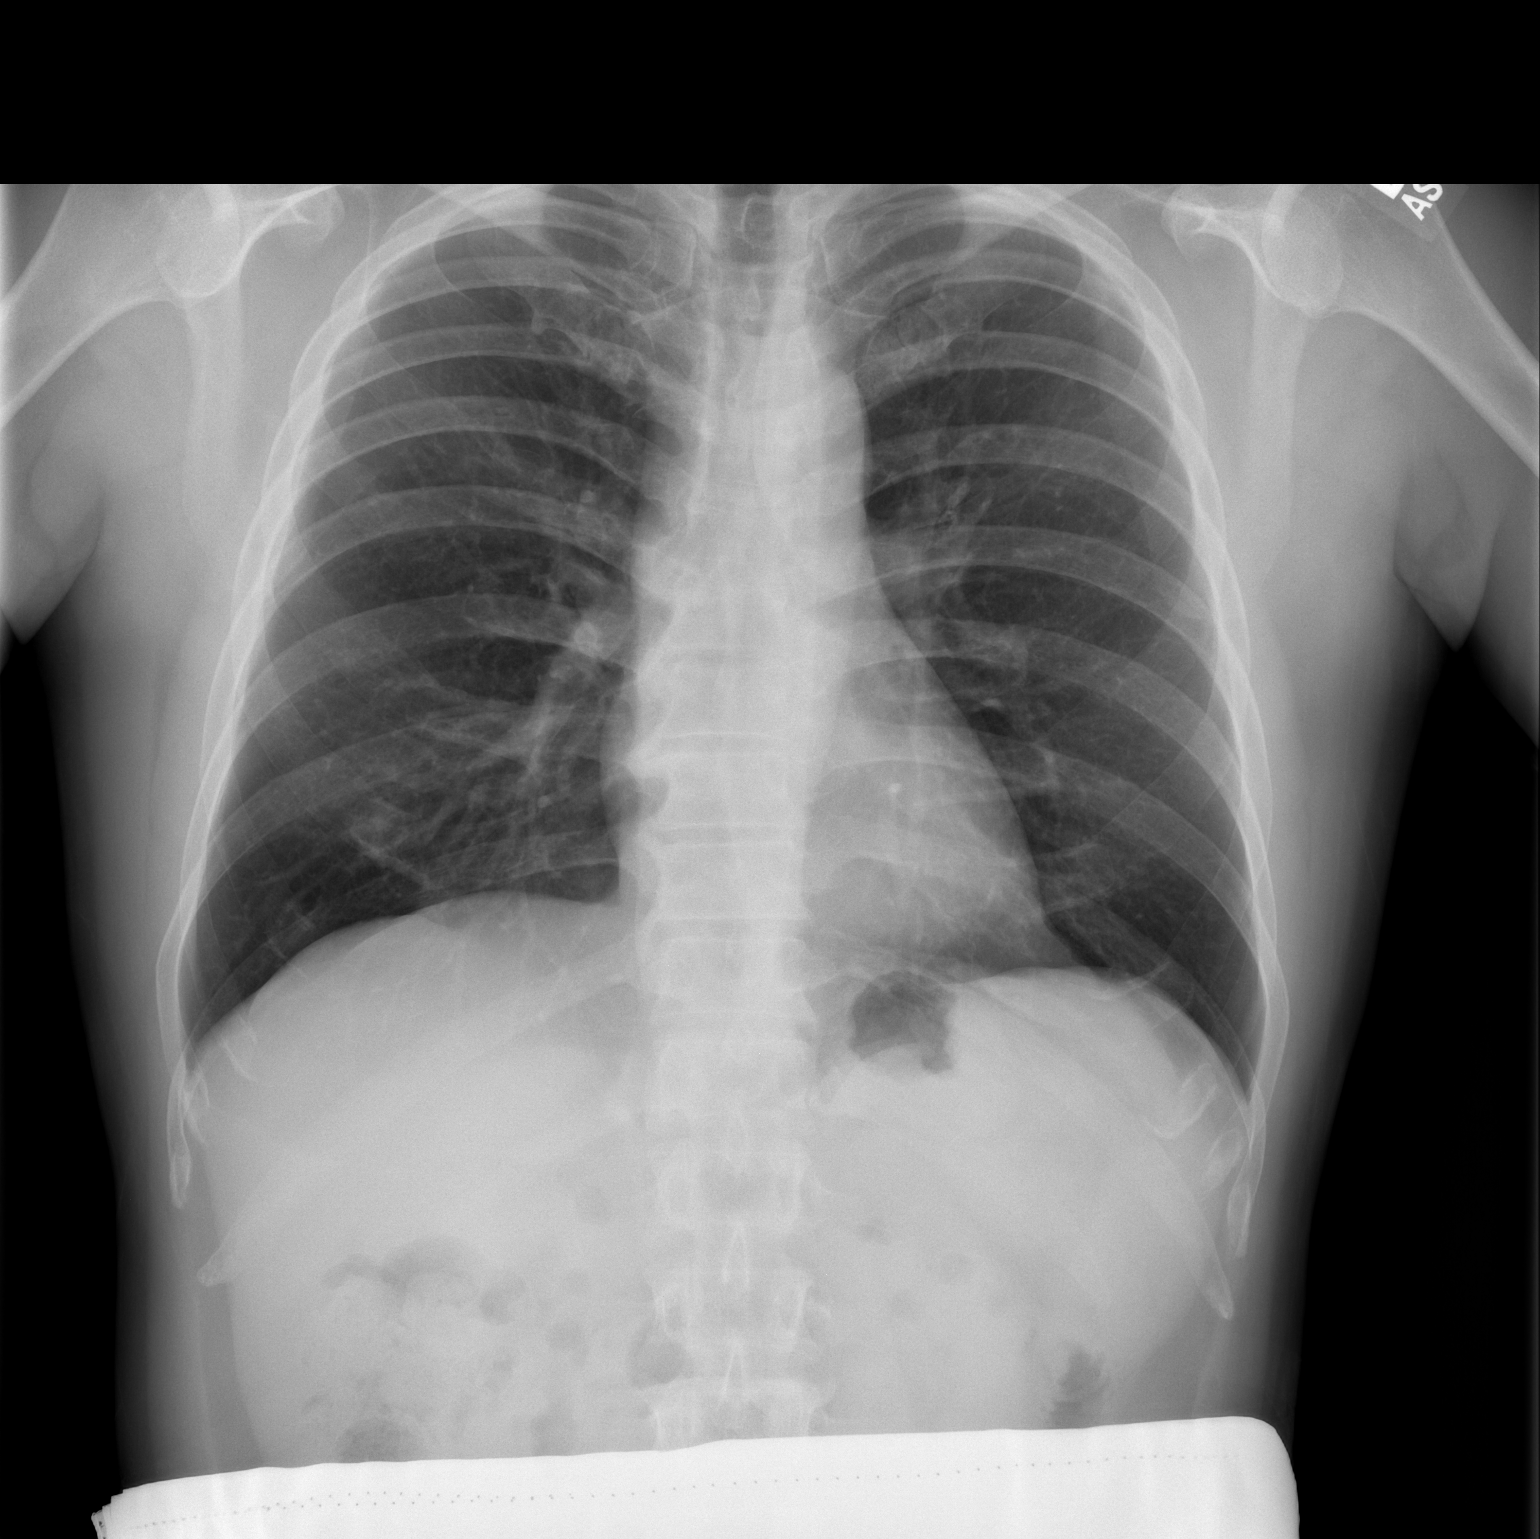

[w chest lat]
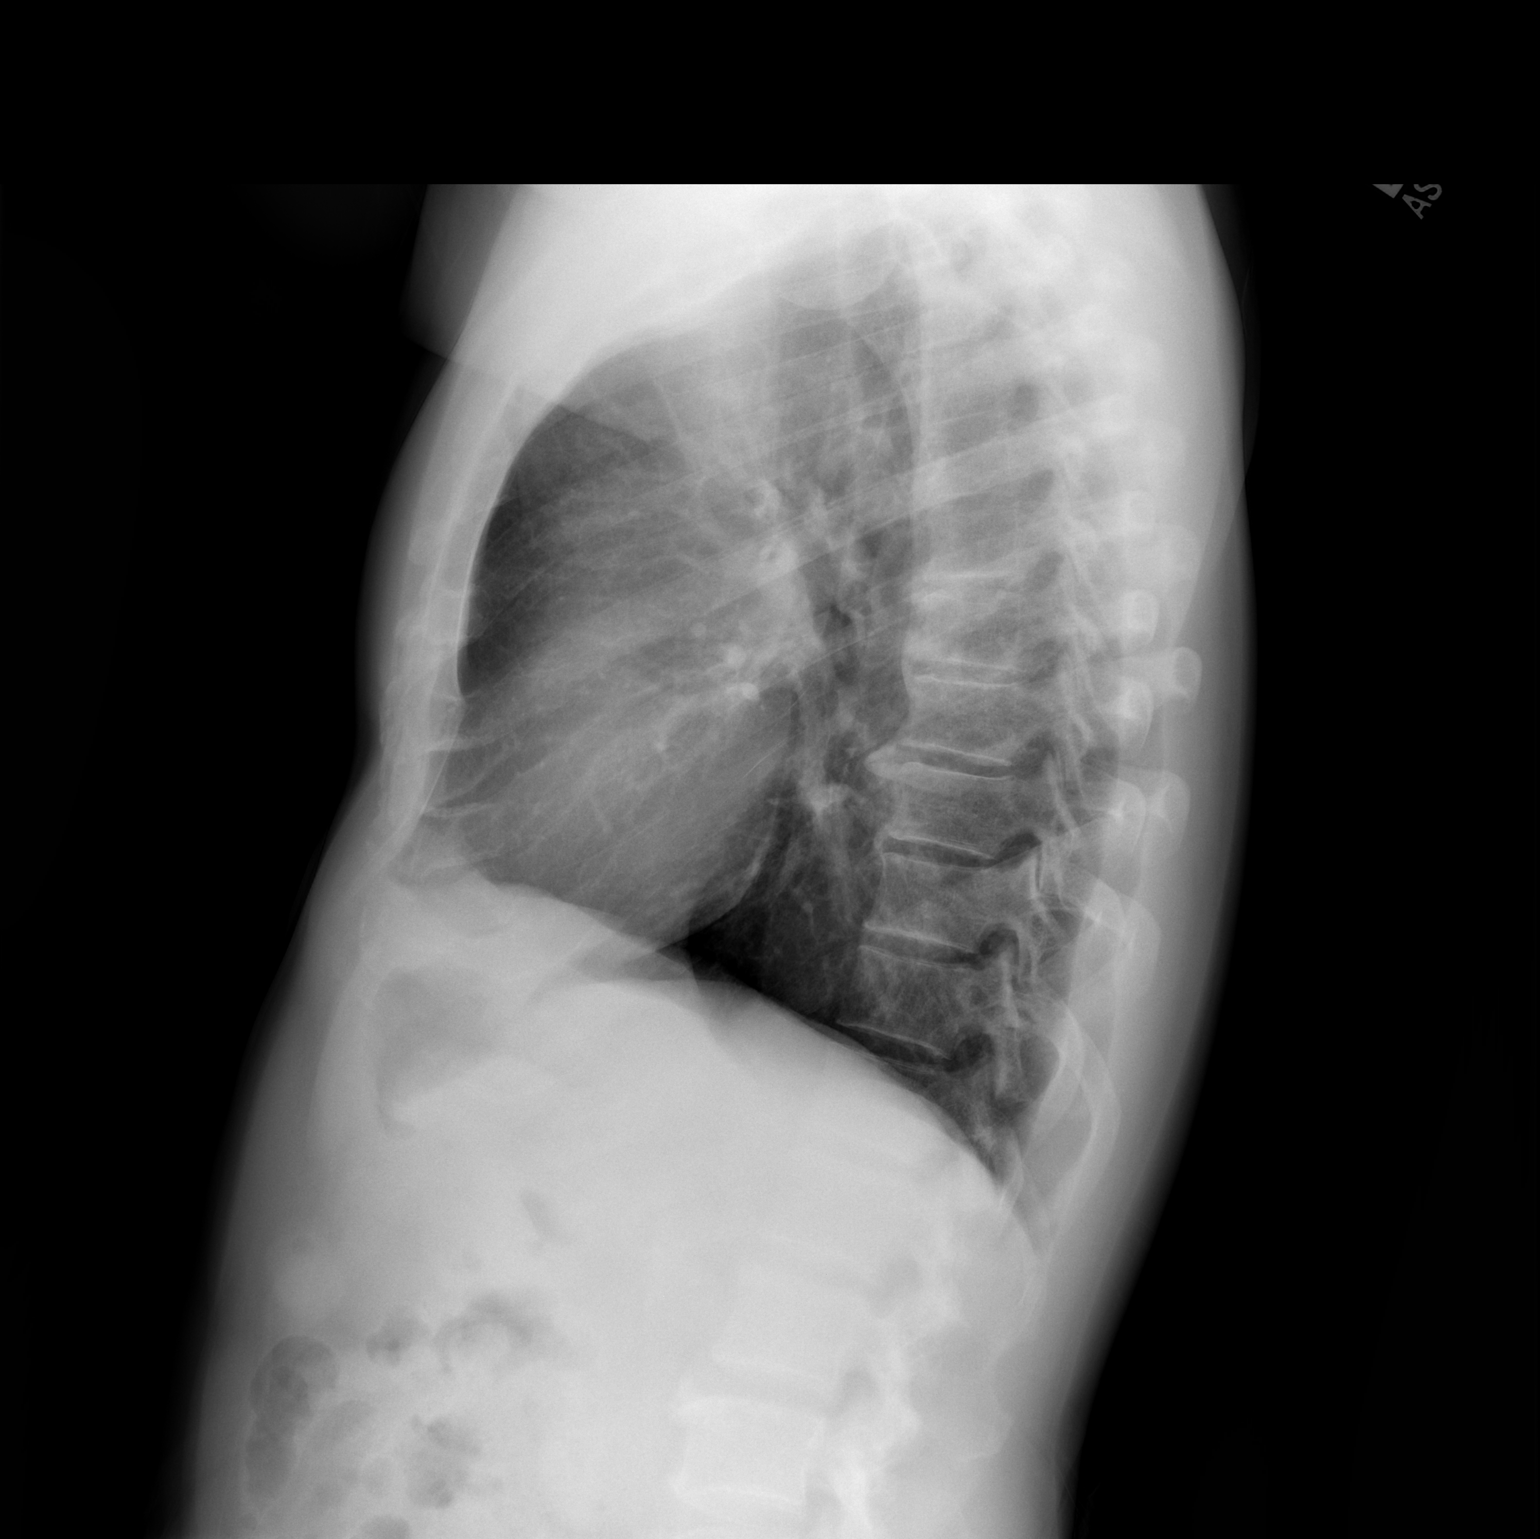

[2 of 2 positions shown; findings below may reference images not displayed]

FINDINGS: The heart size and mediastinal contours are within normal limits.
Both lungs are clear. The visualized skeletal structures are
unremarkable.
IMPRESSION: No active cardiopulmonary disease.

## 2015-07-12 ENCOUNTER — Encounter (HOSPITAL_COMMUNITY): Payer: Self-pay | Admitting: Emergency Medicine

## 2015-07-12 ENCOUNTER — Emergency Department (HOSPITAL_COMMUNITY): Payer: BLUE CROSS/BLUE SHIELD

## 2015-07-12 ENCOUNTER — Emergency Department (HOSPITAL_COMMUNITY)
Admission: EM | Admit: 2015-07-12 | Discharge: 2015-07-13 | Disposition: A | Discharge status: Home / Self Care | Payer: BLUE CROSS/BLUE SHIELD | Attending: Emergency Medicine | Admitting: Emergency Medicine

## 2015-07-12 DIAGNOSIS — J209 Acute bronchitis, unspecified: Secondary | ICD-10-CM | POA: Insufficient documentation

## 2015-07-12 DIAGNOSIS — R0981 Nasal congestion: Secondary | ICD-10-CM | POA: Diagnosis present

## 2015-07-12 IMAGING — CR DG CHEST 2V
2 series · 2 of 2 positions shown · non-contrast
Comparison: Radiograph [DATE]

CLINICAL DATA: Cough and congestion, short of breath

EXAM:
CHEST  2 VIEW

[chest pa]
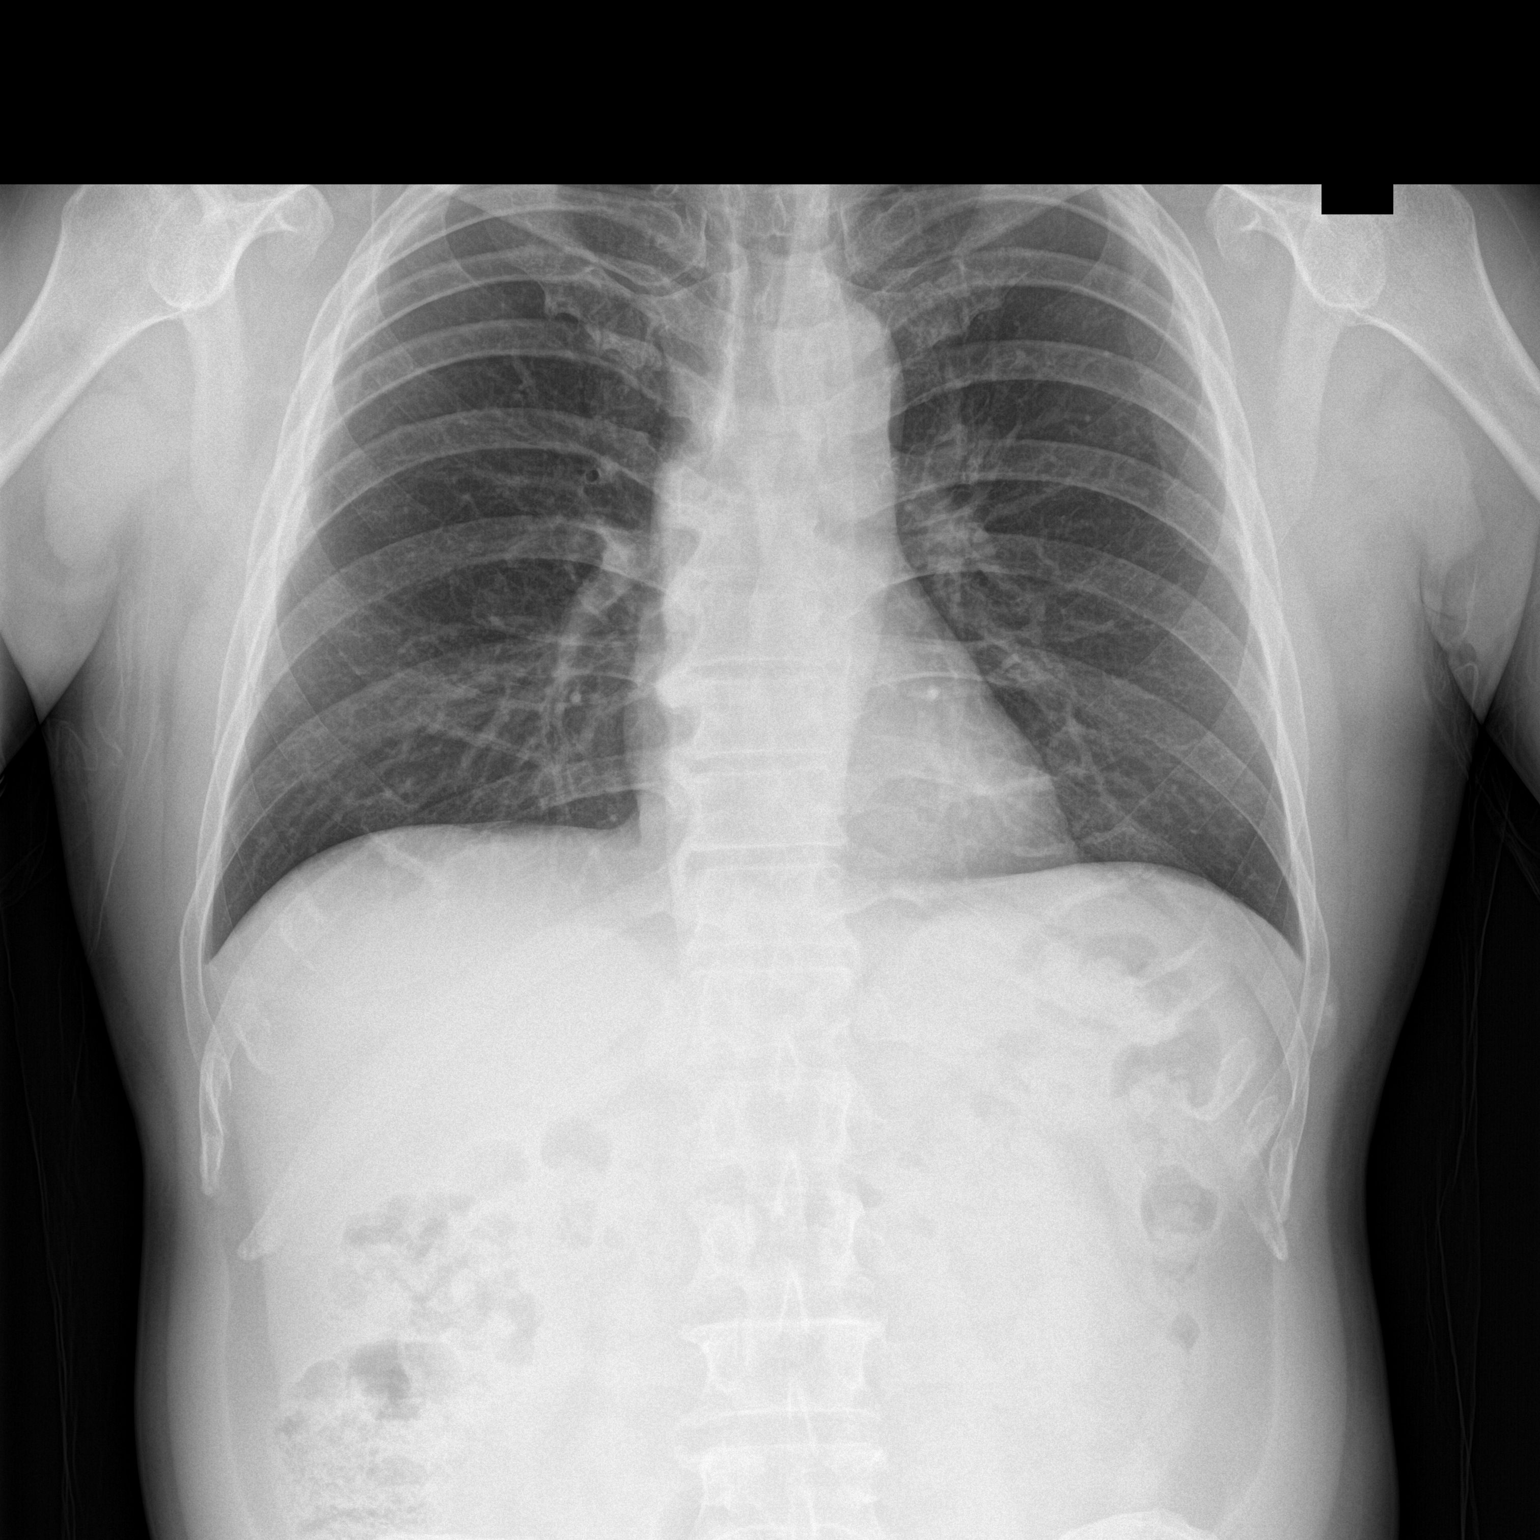

[chest lat]
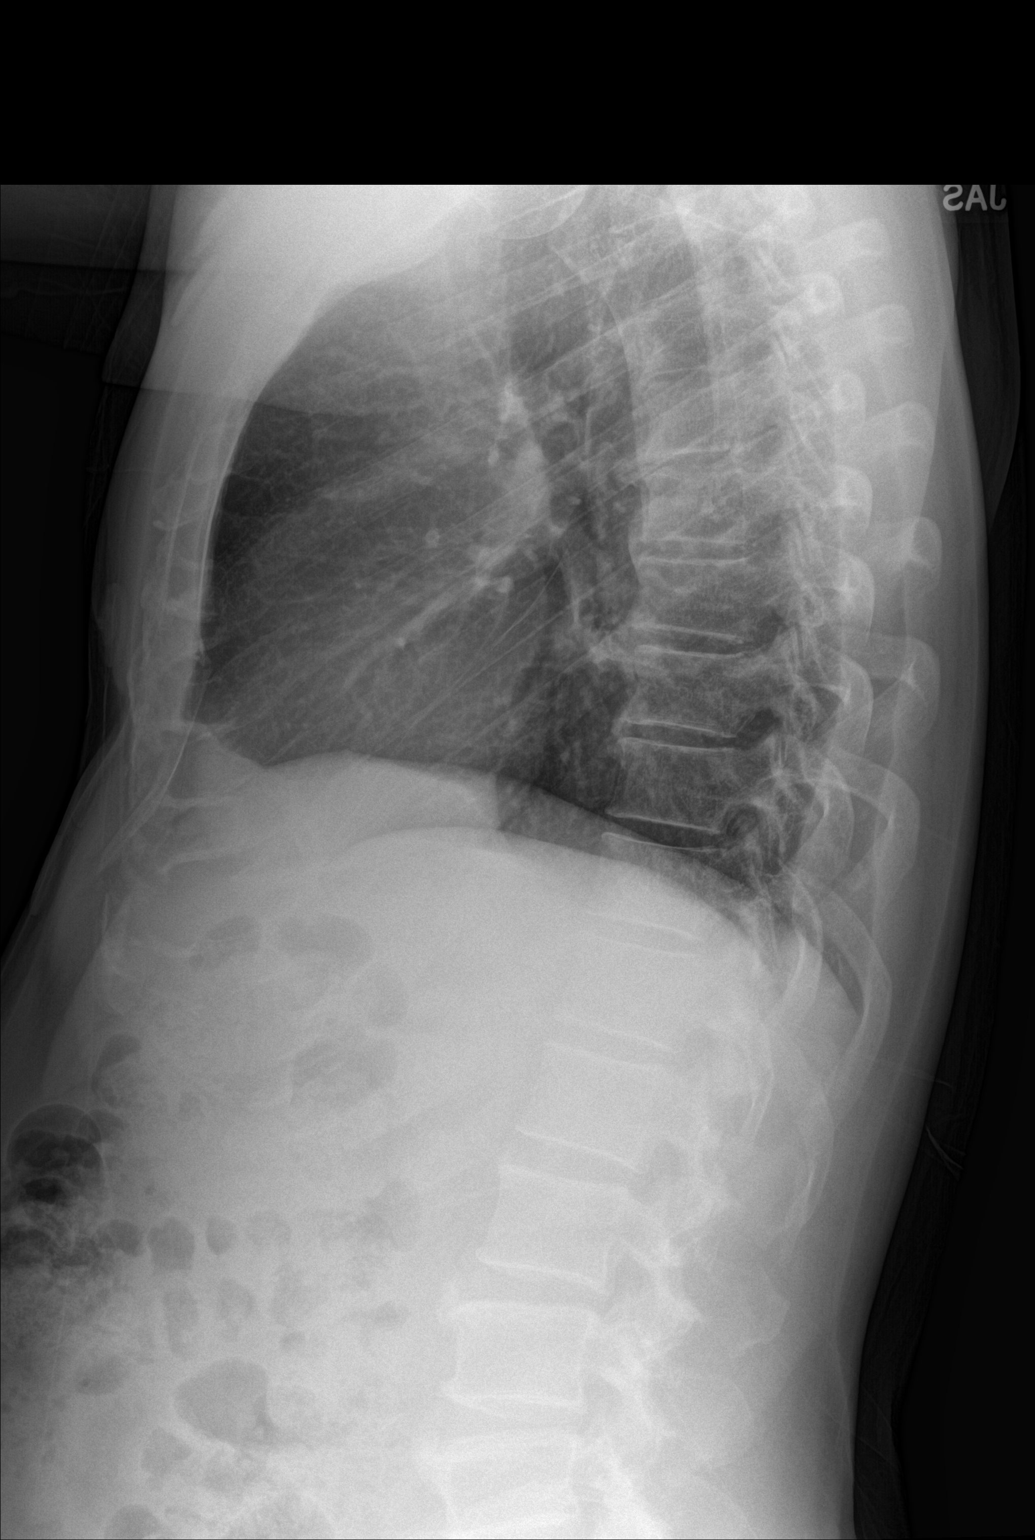

[2 of 2 positions shown; findings below may reference images not displayed]

FINDINGS: Normal mediastinum and cardiac silhouette. Normal pulmonary
vasculature. No evidence of effusion, infiltrate, or pneumothorax.
No acute bony abnormality. Degenerative osteophytosis of the
thoracic spine.
IMPRESSION: No acute cardiopulmonary process.

## 2015-07-12 MED ORDER — AZITHROMYCIN 250 MG PO TABS
500.0000 mg | ORAL_TABLET | Freq: Once | ORAL | Status: AC
  Administered 2015-07-12: 500 mg via ORAL
  Filled 2015-07-12: qty 2

## 2015-07-12 NOTE — ED Provider Notes (Signed)
CSN: [id number]     Arrival date & time 07/12/15  2040 History   First MD Initiated Contact with Patient 07/12/15 2305     Chief Complaint  Patient presents with  . Nasal Congestion  . Cough     Patient is a 54 y.o. male presenting with cough. The history is provided by the patient. No language interpreter was used.  Cough  Mr. Scipio presents for evaluation of chest tightness and cough. History is provided by the patient and his son-in-law. He reports a burning sensation in his chest with cough productive of yellow sputum. He has nasal congestion and headache. Symptoms have been ongoing for several days and worsening today. He denies any fevers, abdominal pain, shortness of breath, vomiting, hemoptysis, leg swelling or pain. He has a history of hypertension. No history of DVT or PE.  History reviewed. No pertinent past medical history. History reviewed. No pertinent past surgical history. No family history on file. History  Substance Use Topics  . Smoking status: Never Smoker   . Smokeless tobacco: Not on file  . Alcohol Use: No    Review of Systems  Respiratory: Positive for cough.   All other systems reviewed and are negative.     Allergies  Review of patient's allergies indicates no known allergies.  Home Medications   Prior to Admission medications   Medication Sig Start Date End Date Taking? Authorizing Provider  amLODipine (NORVASC) 5 MG tablet Take 5 mg by mouth daily. 07/08/15  Yes Historical Provider, MD  lisinopril (PRINIVIL,ZESTRIL) 10 MG tablet Take 10 mg by mouth daily. 07/08/15  Yes Historical Provider, MD  traMADol (ULTRAM) 50 MG tablet Take 1 tablet (50 mg total) by mouth every 6 (six) hours as needed for pain. Patient not taking: Reported on 07/12/2015 10/04/13   Carmin Muskrat, MD   BP 145/92 mmHg  Pulse 94  Temp(Src) 97.9 F (36.6 C) (Oral)  Resp 19  Wt 134 lb 8 oz (61.009 kg)  SpO2 99% Physical Exam  Constitutional: He is oriented to person, place,  and time. He appears well-developed and well-nourished.  HENT:  Head: Normocephalic and atraumatic.  Cardiovascular: Normal rate and regular rhythm.   No murmur heard. Pulmonary/Chest: Effort normal and breath sounds normal. No respiratory distress.  Abdominal: Soft. There is no tenderness. There is no rebound and no guarding.  Musculoskeletal: He exhibits no edema or tenderness.  Neurological: He is alert and oriented to person, place, and time.  Skin: Skin is warm and dry.  Psychiatric: He has a normal mood and affect. His behavior is normal.  Nursing note and vitals reviewed.   ED Course  Procedures (including critical care time) Labs Review Labs Reviewed  I-STAT CHEM 8, ED - Abnormal; Notable for the following:    Chloride 100 (*)    Glucose, Bld 103 (*)    Hemoglobin 12.2 (*)    HCT 36.0 (*)    All other components within normal limits  I-STAT TROPOININ, ED    Imaging Review Dg Chest 2 View  07/12/2015   CLINICAL DATA:  Cough and congestion, short of breath  EXAM: CHEST  2 VIEW  COMPARISON:  Radiograph 02/06/2014  FINDINGS: Normal mediastinum and cardiac silhouette. Normal pulmonary vasculature. No evidence of effusion, infiltrate, or pneumothorax. No acute bony abnormality. Degenerative osteophytosis of the thoracic spine.  IMPRESSION: No acute cardiopulmonary process.   Electronically Signed   By: Suzy Bouchard M.D.   On: 07/12/2015 21:15     EKG Interpretation  Date/Time:  Friday July 12 2015 20:50:42 EDT Ventricular Rate:  107 PR Interval:  114 QRS Duration: 84 QT Interval:  316 QTC Calculation: 421 R Axis:   73 Text Interpretation:  Sinus tachycardia Otherwise normal ECG Confirmed by  Hazle Coca [phone]) on 07/12/2015 11:30:07 PM      MDM   Final diagnoses:  Acute bronchitis, unspecified organism    Patient here for evaluation of cough, chest burning, nasal congestion, headaches. Patient is with no respiratory distress on examination and clear lungs  bilaterally. History of presentation is not consistent with CHF, ACS, PE, pneumonia, COPD. Discussed with patient's findings of bronchitis, home care, return precautions.    Quintella Reichert, MD 07/13/15 [phone]

## 2015-07-12 NOTE — ED Notes (Signed)
Pt. reports nasal congestion with productive cough , chest congestion/tightness and headache onset this week . Denies fever or chills.

## 2015-07-13 LAB — I-STAT TROPONIN, ED: Troponin i, poc: 0 ng/mL (ref 0.00–0.08)

## 2015-07-13 LAB — I-STAT CHEM 8, ED
BUN: 20 mg/dL (ref 6–20)
CALCIUM ION: 1.2 mmol/L (ref 1.12–1.23)
Chloride: 100 mmol/L — ABNORMAL LOW (ref 101–111)
Creatinine, Ser: 1.1 mg/dL (ref 0.61–1.24)
Glucose, Bld: 103 mg/dL — ABNORMAL HIGH (ref 65–99)
HEMATOCRIT: 36 % — AB (ref 39.0–52.0)
Hemoglobin: 12.2 g/dL — ABNORMAL LOW (ref 13.0–17.0)
Potassium: 4.6 mmol/L (ref 3.5–5.1)
SODIUM: 135 mmol/L (ref 135–145)
TCO2: 24 mmol/L (ref 0–100)

## 2015-07-13 MED ORDER — AZITHROMYCIN 250 MG PO TABS: 250.0000 mg | ORAL_TABLET | Freq: Every day | ORAL | Status: DC

## 2015-07-13 NOTE — Discharge Instructions (Signed)
Take a decongestant, available over the counter such as sudafed or mucinex.   Acute Bronchitis Bronchitis is inflammation of the airways that extend from the windpipe into the lungs (bronchi). The inflammation often causes mucus to develop. This leads to a cough, which is the most common symptom of bronchitis.  In acute bronchitis, the condition usually develops suddenly and goes away over time, usually in a couple weeks. Smoking, allergies, and asthma can make bronchitis worse. Repeated episodes of bronchitis may cause further lung problems.  CAUSES Acute bronchitis is most often caused by the same virus that causes a cold. The virus can spread from person to person (contagious) through coughing, sneezing, and touching contaminated objects. SIGNS AND SYMPTOMS   Cough.   Fever.   Coughing up mucus.   Body aches.   Chest congestion.   Chills.   Shortness of breath.   Sore throat.  DIAGNOSIS  Acute bronchitis is usually diagnosed through a physical exam. Your health care provider will also ask you questions about your medical history. Tests, such as chest X-rays, are sometimes done to rule out other conditions.  TREATMENT  Acute bronchitis usually goes away in a couple weeks. Oftentimes, no medical treatment is necessary. Medicines are sometimes given for relief of fever or cough. Antibiotic medicines are usually not needed but may be prescribed in certain situations. In some cases, an inhaler may be recommended to help reduce shortness of breath and control the cough. A cool mist vaporizer may also be used to help thin bronchial secretions and make it easier to clear the chest.  HOME CARE INSTRUCTIONS  Get plenty of rest.   Drink enough fluids to keep your urine clear or pale yellow (unless you have a medical condition that requires fluid restriction). Increasing fluids may help thin your respiratory secretions (sputum) and reduce chest congestion, and it will prevent  dehydration.   Take medicines only as directed by your health care provider.  If you were prescribed an antibiotic medicine, finish it all even if you start to feel better.  Avoid smoking and secondhand smoke. Exposure to cigarette smoke or irritating chemicals will make bronchitis worse. If you are a smoker, consider using nicotine gum or skin patches to help control withdrawal symptoms. Quitting smoking will help your lungs heal faster.   Reduce the chances of another bout of acute bronchitis by washing your hands frequently, avoiding people with cold symptoms, and trying not to touch your hands to your mouth, nose, or eyes.   Keep all follow-up visits as directed by your health care provider.  SEEK MEDICAL CARE IF: Your symptoms do not improve after 1 week of treatment.  SEEK IMMEDIATE MEDICAL CARE IF:  You develop an increased fever or chills.   You have chest pain.   You have severe shortness of breath.  You have bloody sputum.   You develop dehydration.  You faint or repeatedly feel like you are going to pass out.  You develop repeated vomiting.  You develop a severe headache. MAKE SURE YOU:   Understand these instructions.  Will watch your condition.  Will get help right away if you are not doing well or get worse. Document Released: 01/07/2005 Document Revised: 04/16/2014 Document Reviewed: 05/23/2013 [facility] Patient Information 2015 [place], [place]. This information is not intended to replace advice given to you by your health care provider. Make sure you discuss any questions you have with your health care provider.

## 2017-08-25 ENCOUNTER — Emergency Department (HOSPITAL_COMMUNITY)
Admission: EM | Admit: 2017-08-25 | Discharge: 2017-08-25 | Disposition: A | Discharge status: Home / Self Care | Payer: Self-pay | Attending: Emergency Medicine | Admitting: Emergency Medicine

## 2017-08-25 ENCOUNTER — Emergency Department (HOSPITAL_COMMUNITY): Payer: Self-pay

## 2017-08-25 ENCOUNTER — Encounter (HOSPITAL_COMMUNITY): Payer: Self-pay | Admitting: Emergency Medicine

## 2017-08-25 DIAGNOSIS — R071 Chest pain on breathing: Secondary | ICD-10-CM | POA: Insufficient documentation

## 2017-08-25 DIAGNOSIS — I10 Essential (primary) hypertension: Secondary | ICD-10-CM | POA: Insufficient documentation

## 2017-08-25 DIAGNOSIS — Z79899 Other long term (current) drug therapy: Secondary | ICD-10-CM | POA: Insufficient documentation

## 2017-08-25 DIAGNOSIS — M62838 Other muscle spasm: Secondary | ICD-10-CM | POA: Insufficient documentation

## 2017-08-25 DIAGNOSIS — R200 Anesthesia of skin: Secondary | ICD-10-CM | POA: Insufficient documentation

## 2017-08-25 HISTORY — DX: Essential (primary) hypertension: I10

## 2017-08-25 LAB — CBC WITH DIFFERENTIAL/PLATELET
Basophils Absolute: 0 10*3/uL (ref 0.0–0.1)
Basophils Relative: 0 %
EOS PCT: 1 %
Eosinophils Absolute: 0.1 10*3/uL (ref 0.0–0.7)
HEMATOCRIT: 36.2 % — AB (ref 39.0–52.0)
Hemoglobin: 11.9 g/dL — ABNORMAL LOW (ref 13.0–17.0)
LYMPHS ABS: 2 10*3/uL (ref 0.7–4.0)
Lymphocytes Relative: 14 %
MCH: 22.4 pg — AB (ref 26.0–34.0)
MCHC: 32.9 g/dL (ref 30.0–36.0)
MCV: 68.2 fL — AB (ref 78.0–100.0)
MONOS PCT: 4 %
Monocytes Absolute: 0.6 10*3/uL (ref 0.1–1.0)
Neutro Abs: 11.9 10*3/uL — ABNORMAL HIGH (ref 1.7–7.7)
Neutrophils Relative %: 81 %
PLATELETS: 384 10*3/uL (ref 150–400)
RBC: 5.31 MIL/uL (ref 4.22–5.81)
RDW: 14.2 % (ref 11.5–15.5)
WBC: 14.6 10*3/uL — ABNORMAL HIGH (ref 4.0–10.5)

## 2017-08-25 LAB — URINALYSIS, ROUTINE W REFLEX MICROSCOPIC
BILIRUBIN URINE: NEGATIVE
GLUCOSE, UA: NEGATIVE mg/dL
Hgb urine dipstick: NEGATIVE
KETONES UR: NEGATIVE mg/dL
Leukocytes, UA: NEGATIVE
NITRITE: NEGATIVE
PH: 8 (ref 5.0–8.0)
Protein, ur: NEGATIVE mg/dL
Specific Gravity, Urine: 1.012 (ref 1.005–1.030)

## 2017-08-25 LAB — I-STAT TROPONIN, ED
Troponin i, poc: 0 ng/mL (ref 0.00–0.08)
Troponin i, poc: 0 ng/mL (ref 0.00–0.08)

## 2017-08-25 LAB — BASIC METABOLIC PANEL
Anion gap: 10 (ref 5–15)
BUN: 19 mg/dL (ref 6–20)
CO2: 24 mmol/L (ref 22–32)
CREATININE: 1.11 mg/dL (ref 0.61–1.24)
Calcium: 9 mg/dL (ref 8.9–10.3)
Chloride: 98 mmol/L — ABNORMAL LOW (ref 101–111)
GFR calc Af Amer: >60 mL/min (ref >60)
GFR calc non Af Amer: >60 mL/min (ref >60)
GLUCOSE: 101 mg/dL — AB (ref 65–99)
POTASSIUM: 4 mmol/L (ref 3.5–5.1)
Sodium: 132 mmol/L — ABNORMAL LOW (ref 135–145)

## 2017-08-25 LAB — CBG MONITORING, ED: Glucose-Capillary: 112 mg/dL — ABNORMAL HIGH (ref 65–99)

## 2017-08-25 LAB — RAPID URINE DRUG SCREEN, HOSP PERFORMED
Amphetamines: NOT DETECTED
BARBITURATES: NOT DETECTED
Benzodiazepines: NOT DETECTED
COCAINE: NOT DETECTED
Opiates: NOT DETECTED
Tetrahydrocannabinol: NOT DETECTED

## 2017-08-25 LAB — D-DIMER, QUANTITATIVE (NOT AT ARMC): D DIMER QUANT: 0.37 ug{FEU}/mL (ref 0.00–0.50)

## 2017-08-25 IMAGING — CT CT HEAD W/O CM
3 series · 16 of 47 positions shown, 19 images · non-contrast
Comparison: None.

CLINICAL DATA: 56 ANY. Presents with altered mental status. Poor
historian, slept through exam

EXAM:
CT HEAD WITHOUT CONTRAST
TECHNIQUE: Contiguous axial images were obtained from the base of the skull
through the vertex without intravenous contrast.

[Series 3: head 5.0 h30s · axial · 0.47mm/px · z∈[-147,-17]mm · 10 of 32 slices shown, 13 images]
[im 3/32  brain]
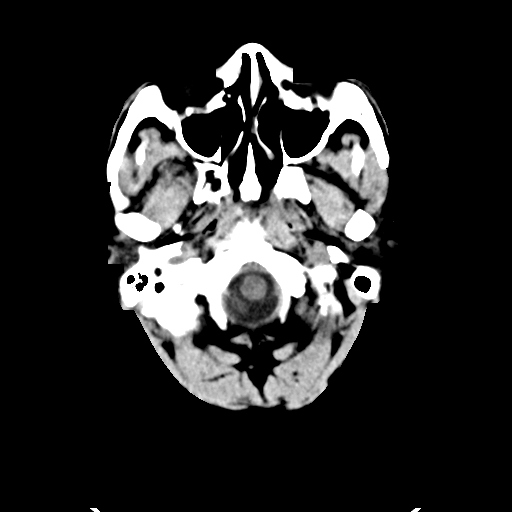
[im 3/32  bone]
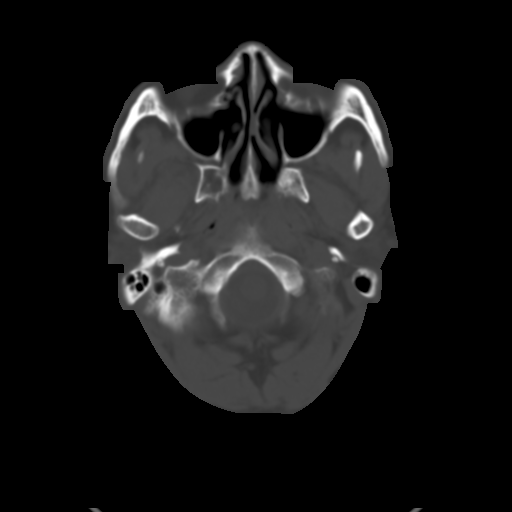
[im 6/32  brain]
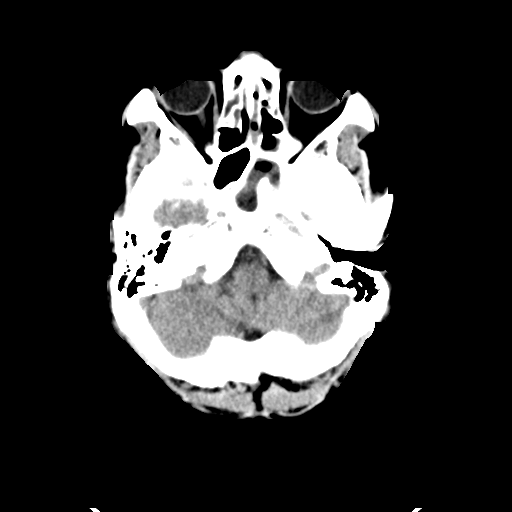
[im 9/32  brain]
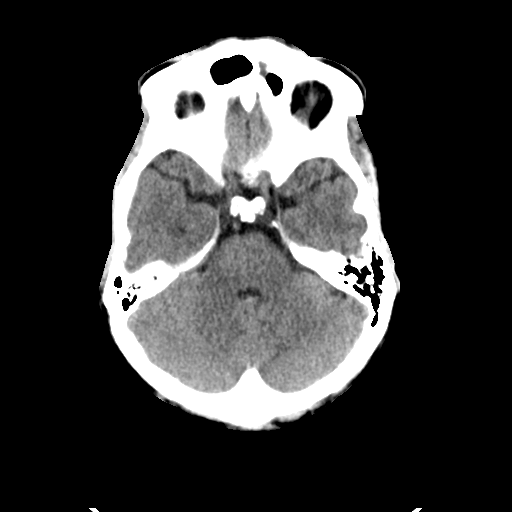
[im 11/32  brain]
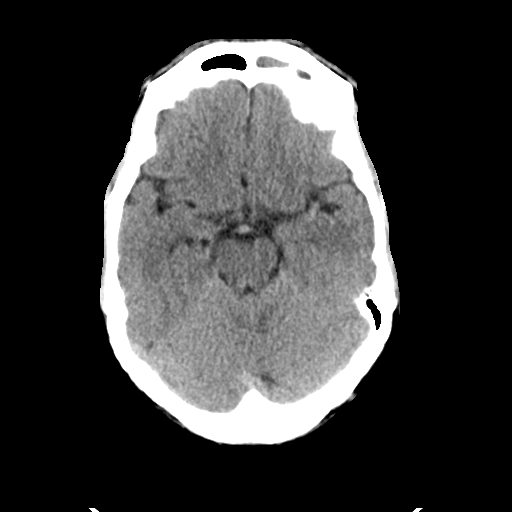
[im 14/32  brain]
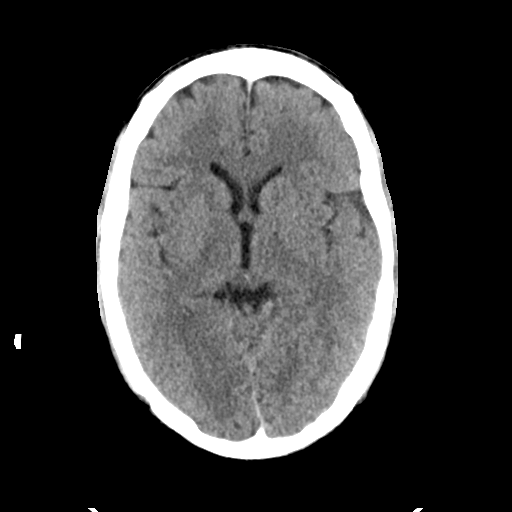
[im 14/32  bone]
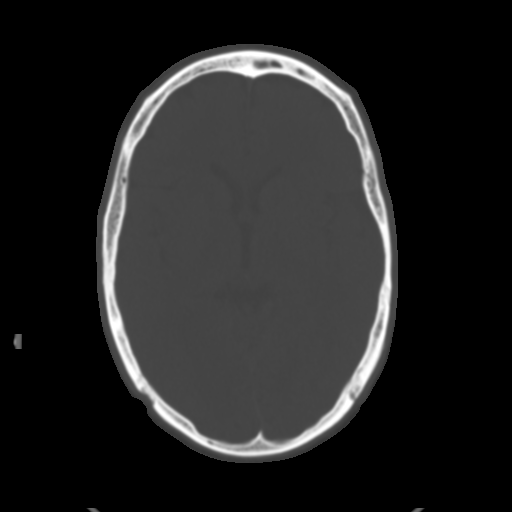
[im 18/32  brain]
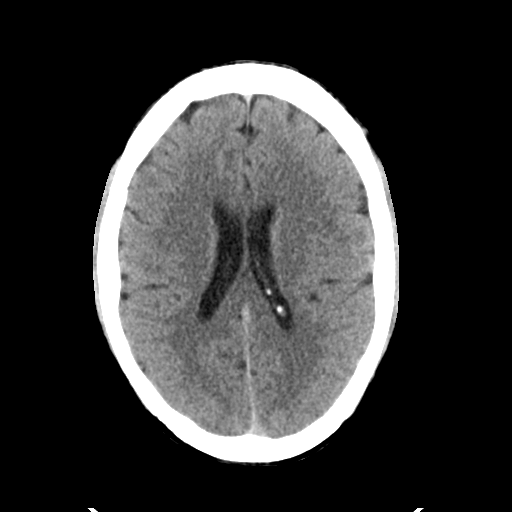
[im 21/32  brain]
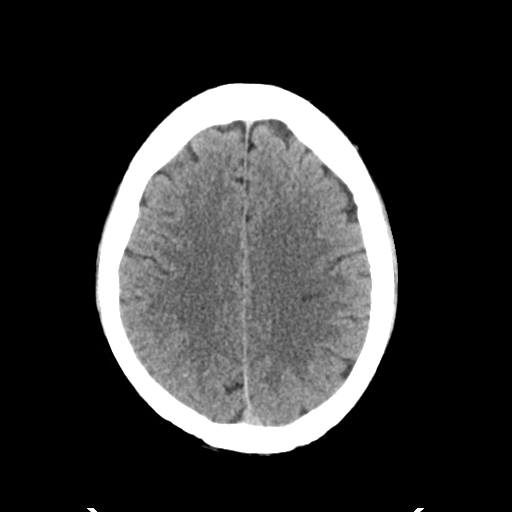
[im 24/32  brain]
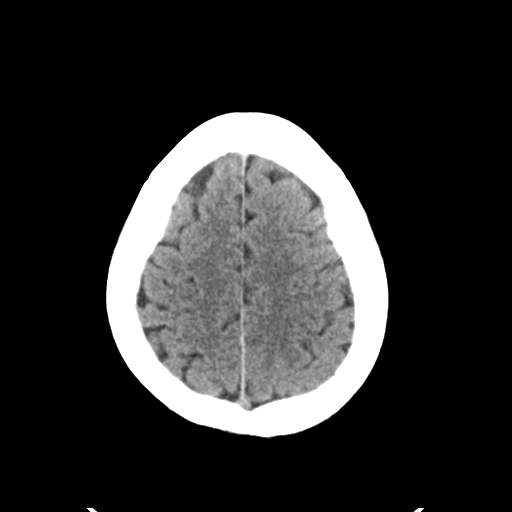
[im 26/32  brain]
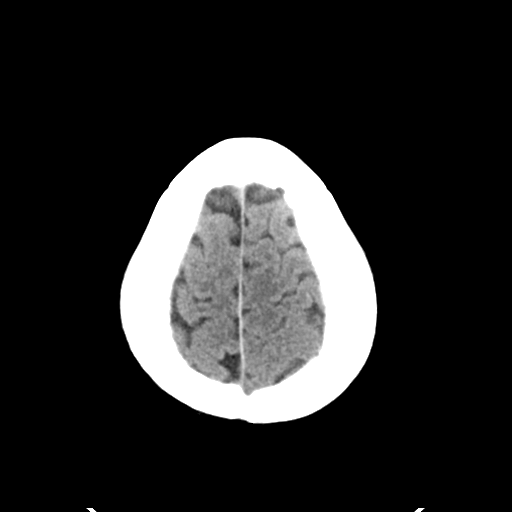
[im 26/32  bone]
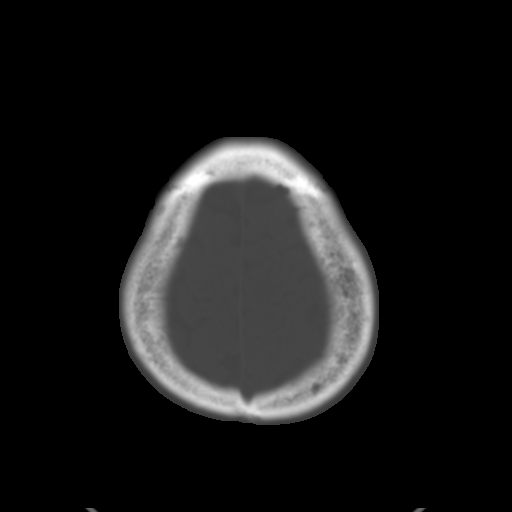
[im 29/32  brain]
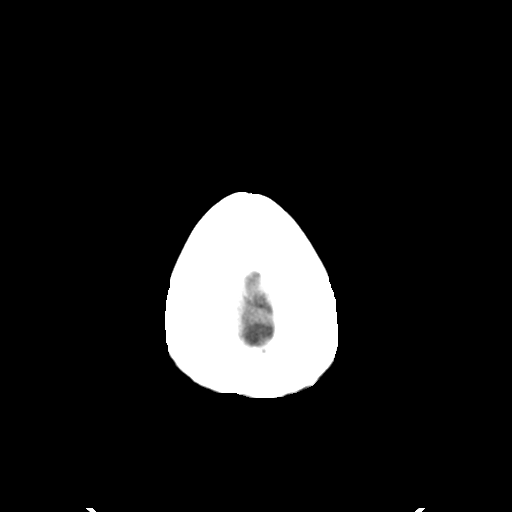

[Series 5: head 3.0 mpr cor · coronal · 0.32mm/px · 3 of 65 slices shown]
[im 24/65  brain]
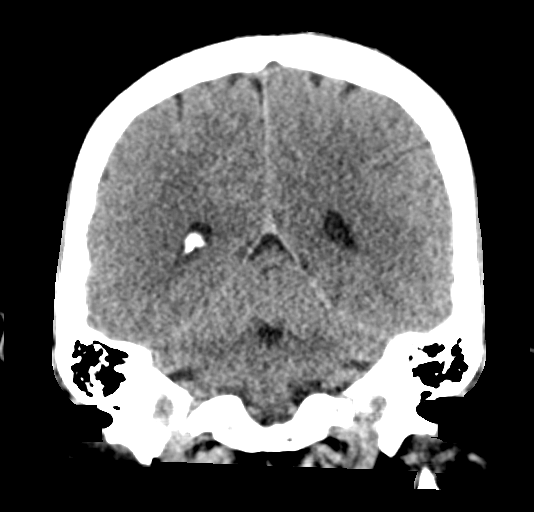
[im 30/65  brain]
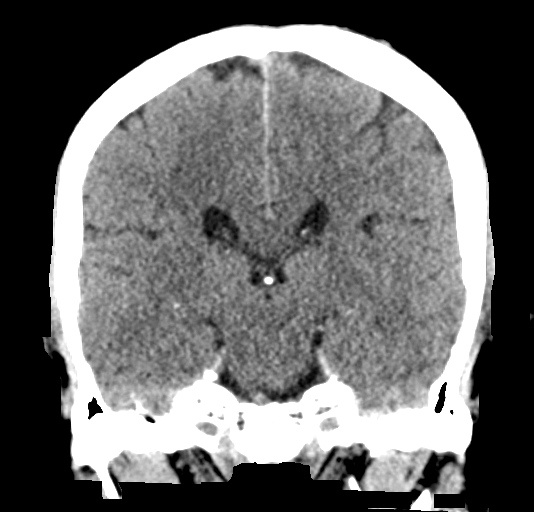
[im 35/65  brain]
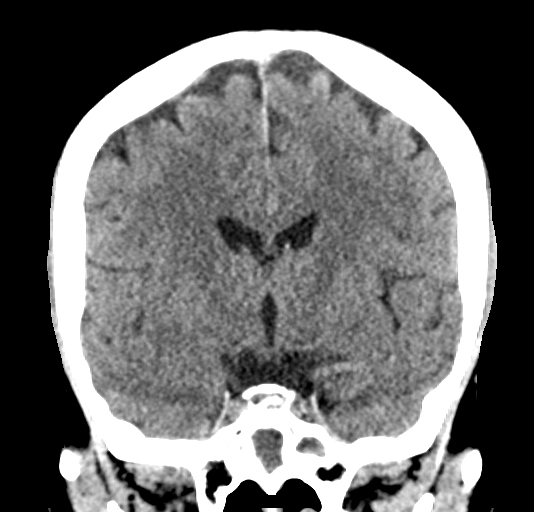

[Series 6: head 3.0 mpr sag · sagittal · 0.35mm/px · 3 of 61 slices shown]
[im 21/61  brain]
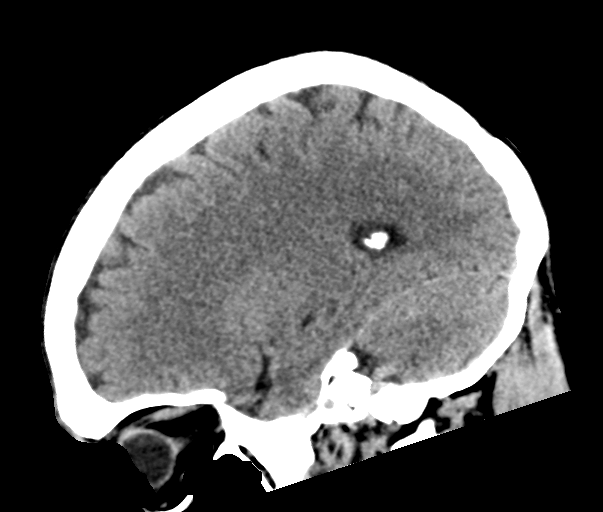
[im 31/61  brain]
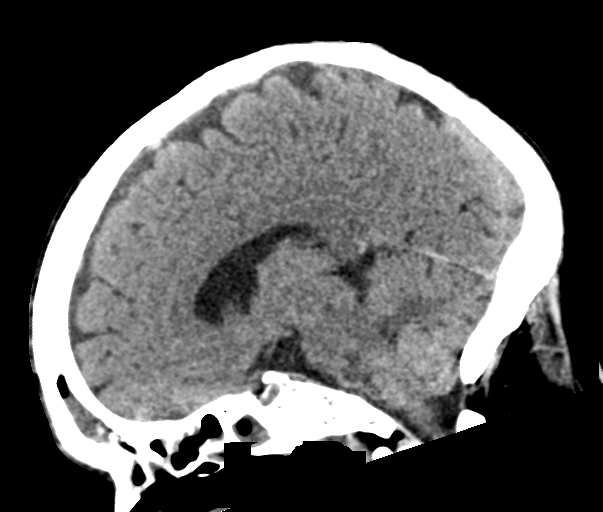
[im 41/61  brain]
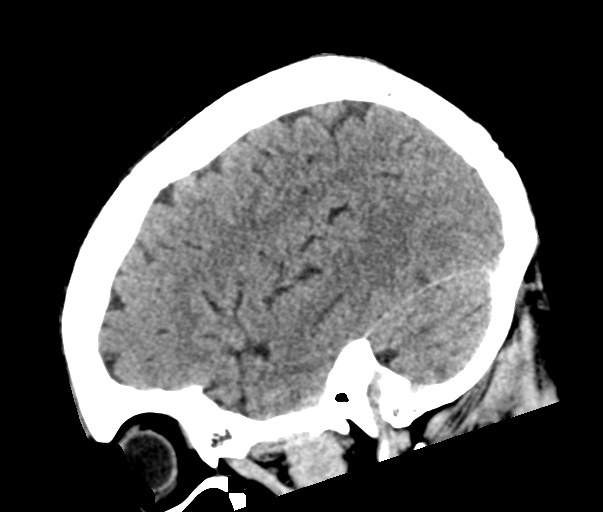

[16 of 47 positions shown; findings below may reference images not displayed]

FINDINGS: Brain: No evidence of acute infarction, hemorrhage, hydrocephalus,
extra-axial collection or mass lesion/mass effect.

Vascular: No hyperdense vessel or unexpected calcification.

Skull: Normal. Negative for fracture or focal lesion.

Sinuses/Orbits: Near complete opacification of left frontal,
sphenoid, and bilateral ethmoid sinuses. Previous bilateral
maxillary antrectomy. Mastoid air cells are normally aerated. Orbits
unremarkable.

Other: None.
IMPRESSION: 1. Negative for bleed or other acute intracranial process.
2. Paranasal sinus disease as above.

## 2017-08-25 IMAGING — CR DG CHEST 2V
2 series · 2 of 2 positions shown · non-contrast
Comparison: Prior radiograph from [DATE].

CLINICAL DATA: Initial evaluation for acute pleuritic chest pain.

EXAM:
CHEST  2 VIEW

[chest lat]
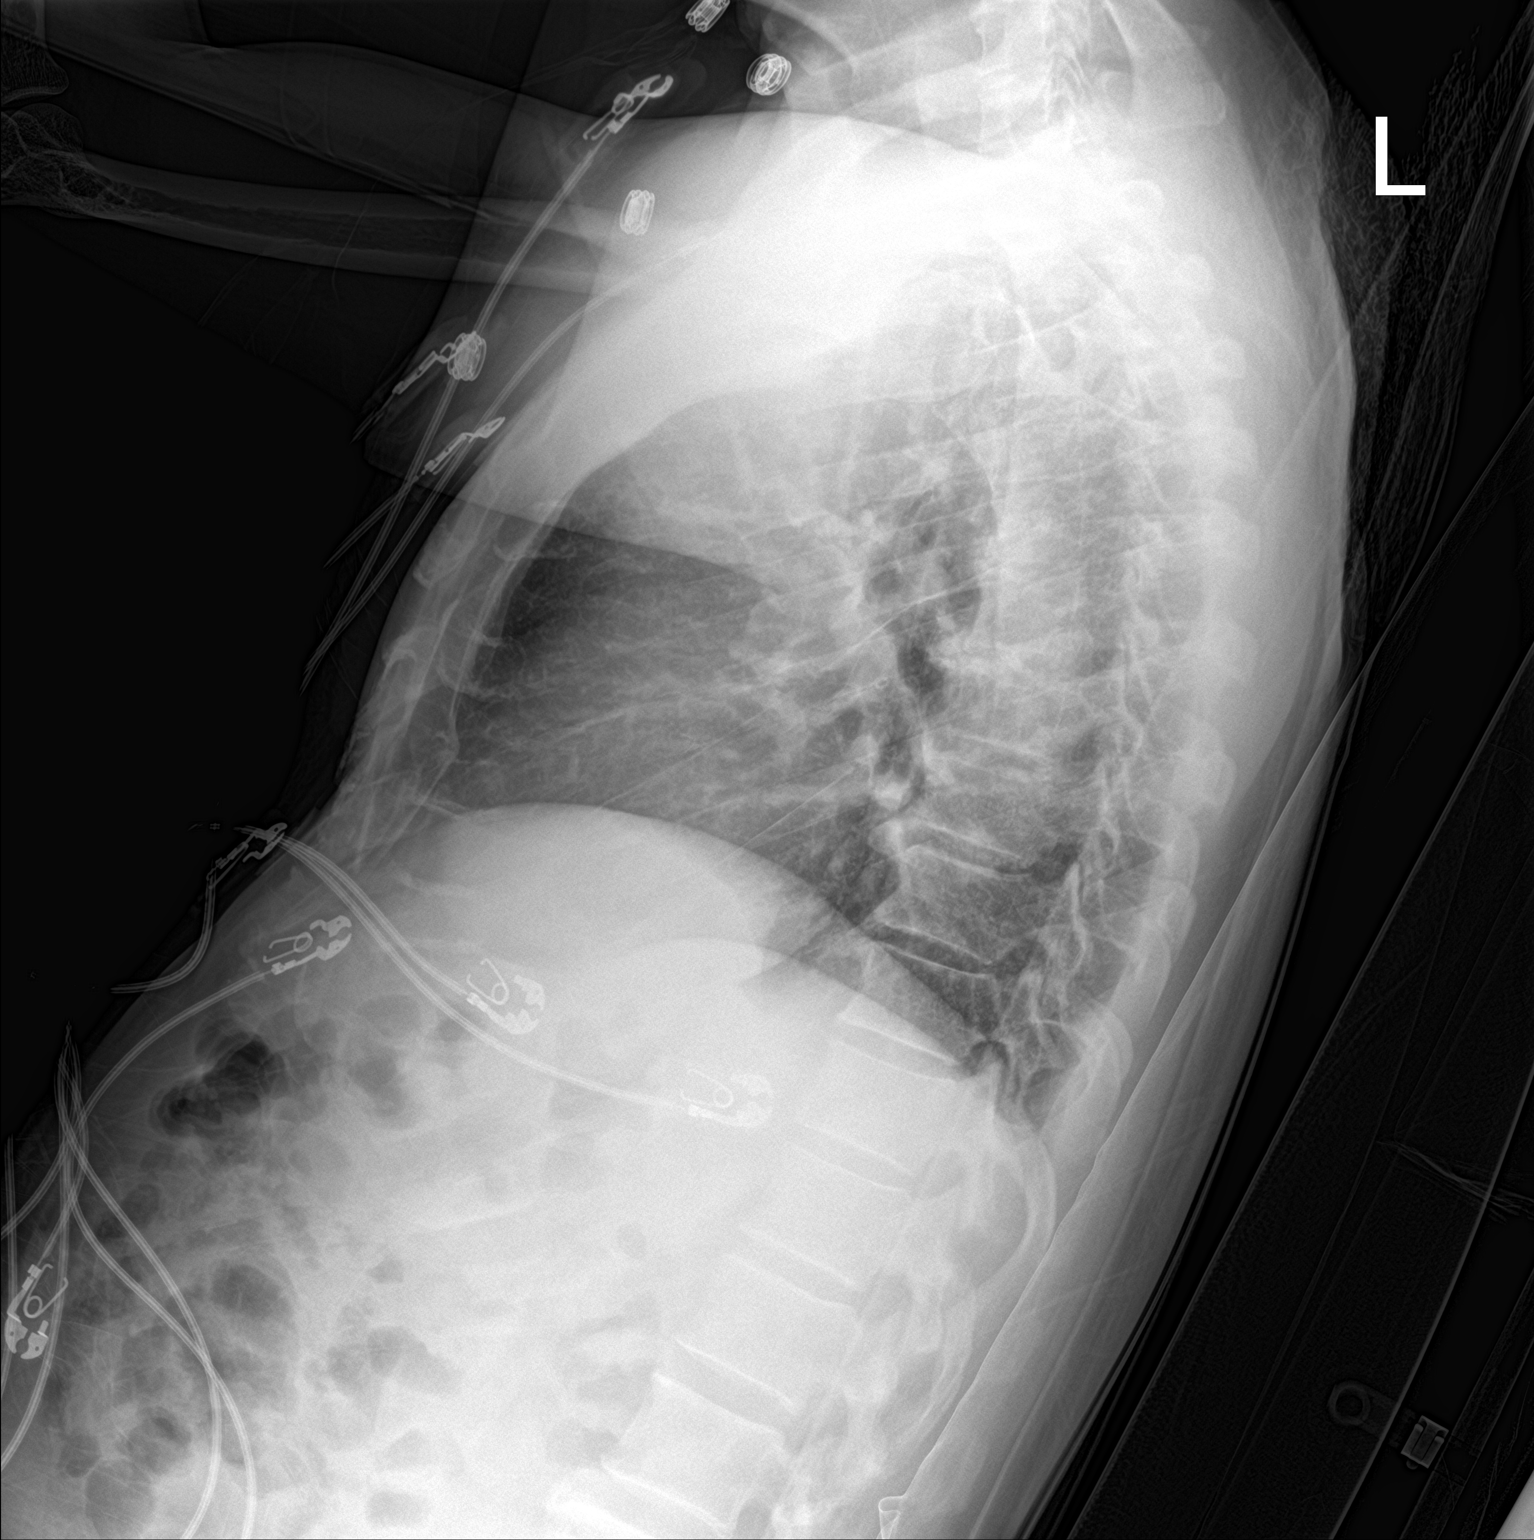

[chest ap]
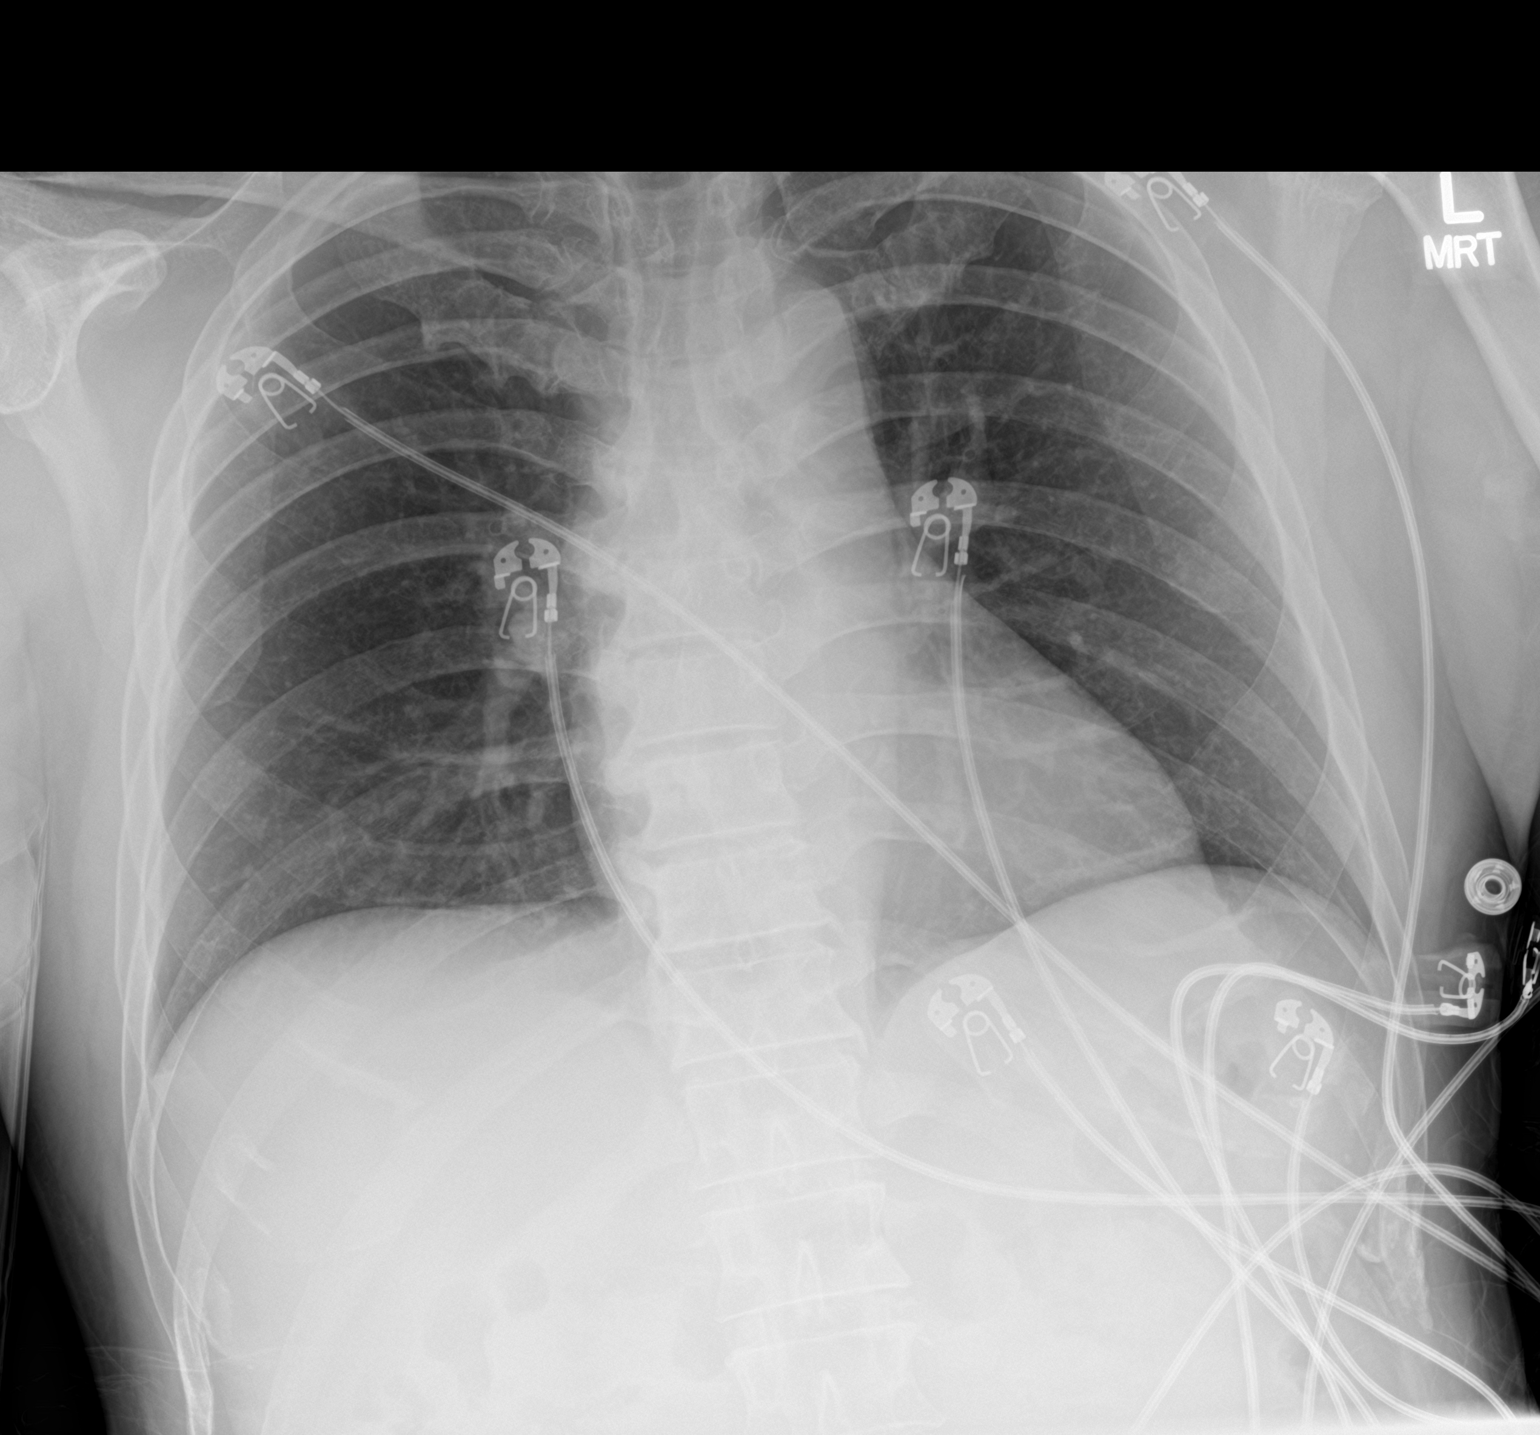

[2 of 2 positions shown; findings below may reference images not displayed]

FINDINGS: The cardiac and mediastinal silhouettes are stable in size and
contour, and remain within normal limits.

The lungs are normally inflated. No airspace consolidation, pleural
effusion, or pulmonary edema is identified. There is no
pneumothorax.

No acute osseous abnormality identified.
IMPRESSION: No active cardiopulmonary disease.

## 2017-08-25 IMAGING — CT CT ANGIO CHEST-ABD-PELV FOR DISSECTION W/ AND WO/W CM
2 of 7 series · 13 of 46 positions shown, 15 images · IV contrast (isovue)
Comparison: None.

CLINICAL DATA: Chest and back pain. Evaluate for aortic dissection.

EXAM:
CT ANGIOGRAPHY CHEST, ABDOMEN AND PELVIS
TECHNIQUE: Multidetector CT imaging through the chest, abdomen and pelvis was
performed using the standard protocol during bolus administration of
intravenous contrast. Multiplanar reconstructed images and MIPs were
obtained and reviewed to evaluate the vascular anatomy.
CONTRAST:  100 cc Isovue 370 intravenous contrast.

[Series 8: dissection 2mm · axial · 0.68mm/px · z∈[-690,-136]mm · 10 of 311 slices shown, 12 images]
[im 17/311  soft-tissue]
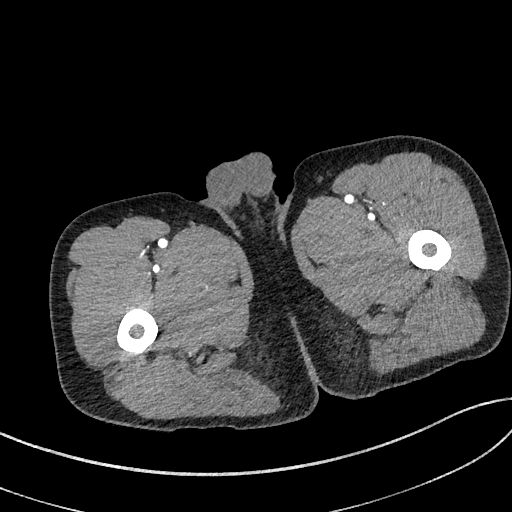
[im 17/311  bone]
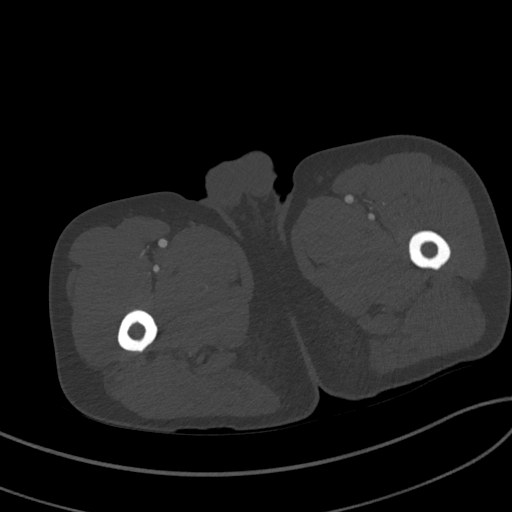
[im 49/311  soft-tissue]
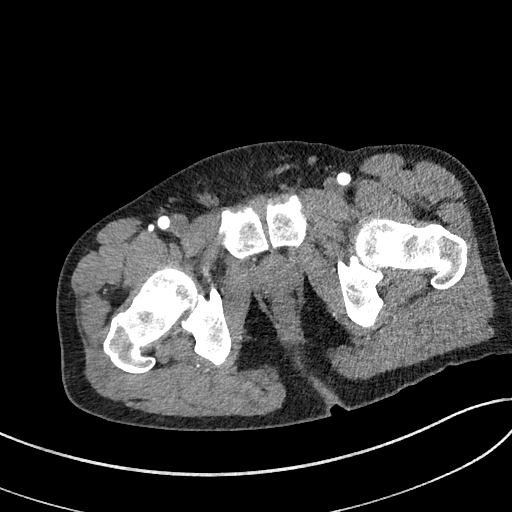
[im 82/311  soft-tissue]
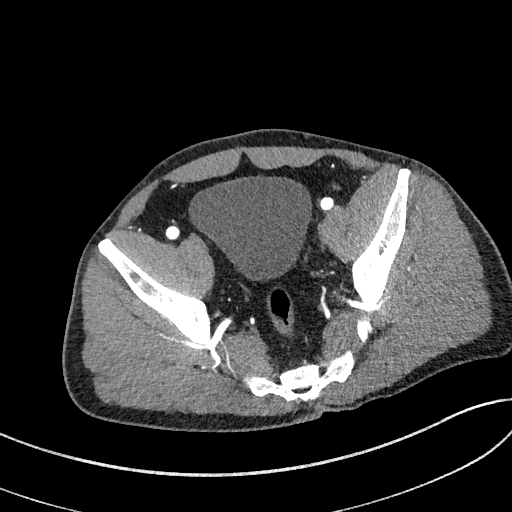
[im 115/311  soft-tissue]
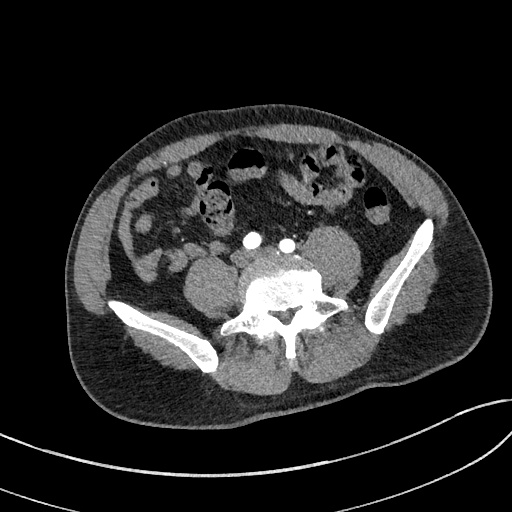
[im 147/311  soft-tissue]
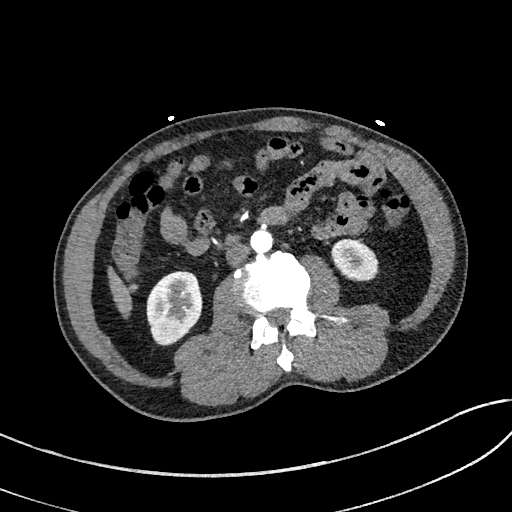
[im 164/311  soft-tissue]
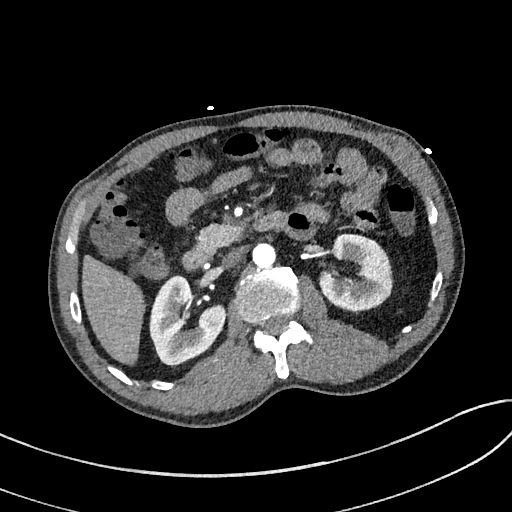
[im 196/311  soft-tissue]
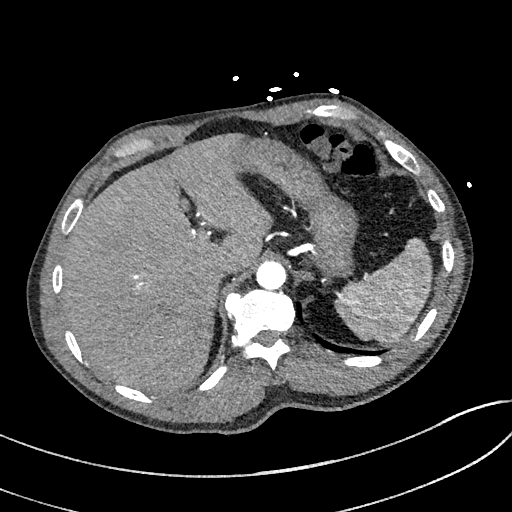
[im 229/311  soft-tissue]
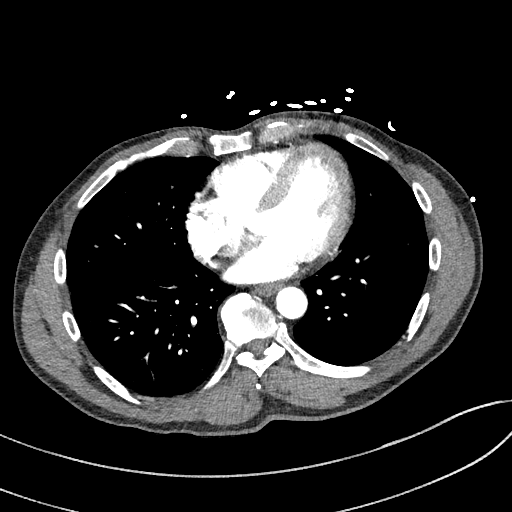
[im 262/311  soft-tissue]
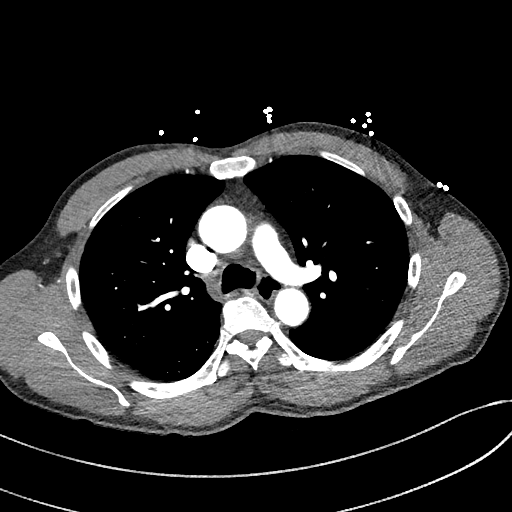
[im 262/311  bone]
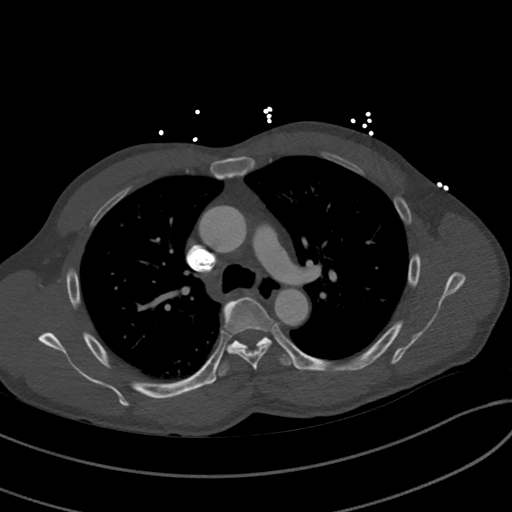
[im 294/311  soft-tissue]
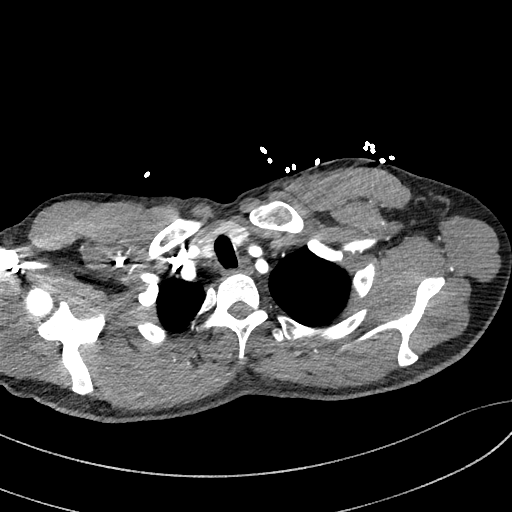

[Series 11: dissection 2mm cor · coronal · 0.69mm/px · 3 of 117 slices shown]
[im 30/117  soft-tissue]
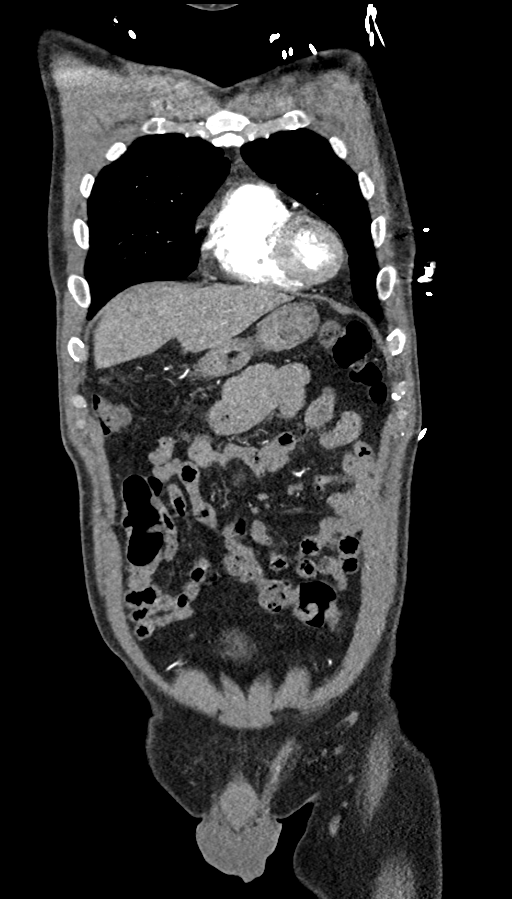
[im 59/117  soft-tissue]
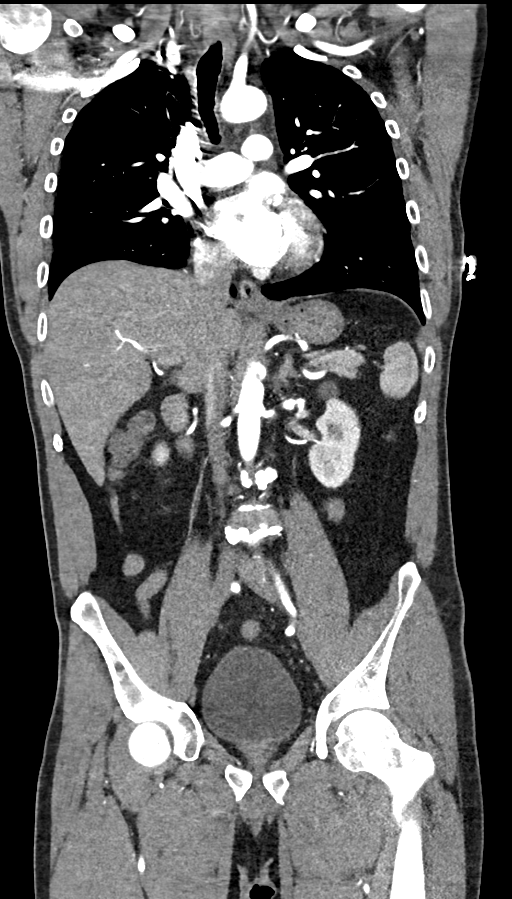
[im 88/117  soft-tissue]
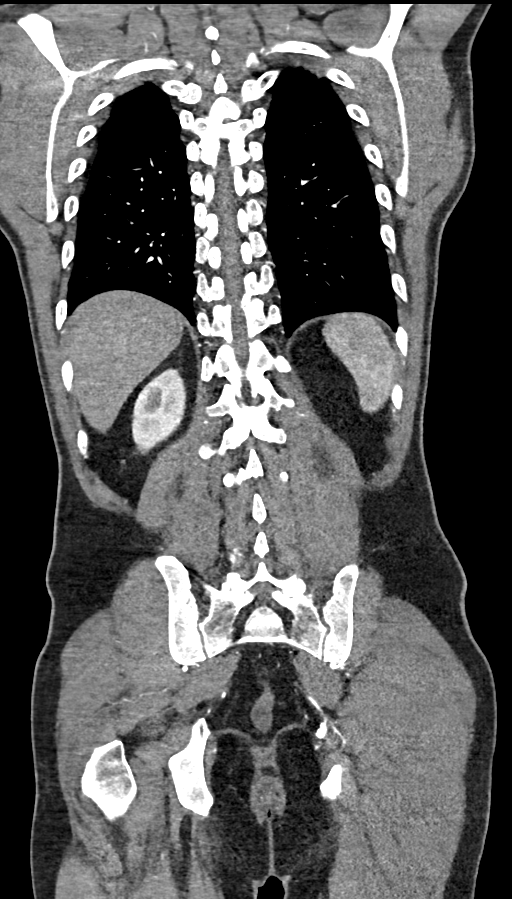

[13 of 46 positions shown; findings below may reference images not displayed]

FINDINGS: CTA CHEST FINDINGS

Cardiovascular: Preferential opacification of the thoracic aorta. No
evidence of thoracic aortic aneurysm or dissection. Normal heart
size. No pericardial effusion. No pulmonary embolism.

Mediastinum/Nodes: No enlarged mediastinal, hilar, or axillary lymph
nodes. Thyroid gland and demonstrate no significant findings.
Possible mid esophageal diverticulum.

Lungs/Pleura: Lungs are clear. No pleural effusion or pneumothorax.

Musculoskeletal: No chest wall abnormality. No acute or significant
osseous findings.

Review of the MIP images confirms the above findings.

CTA ABDOMEN AND PELVIS FINDINGS

VASCULAR

Aorta: Normal caliber aorta without aneurysm, dissection, vasculitis
or significant stenosis.

Celiac: Patent without evidence of aneurysm, dissection, vasculitis
or significant stenosis.

SMA: Patent without evidence of aneurysm, dissection, vasculitis or
significant stenosis.

Renals: Both renal arteries are patent without evidence of aneurysm,
dissection, vasculitis, fibromuscular dysplasia or significant
stenosis.

IMA: Patent without evidence of aneurysm, dissection, vasculitis or
significant stenosis.

Inflow: Patent without evidence of aneurysm, dissection, vasculitis
or significant stenosis.

Veins: No obvious venous abnormality within the limitations of this
arterial phase study.

Review of the MIP images confirms the above findings.

NON-VASCULAR

Hepatobiliary: No focal liver abnormality is seen. No gallstones,
gallbladder wall thickening, or biliary dilatation.

Pancreas: Unremarkable. No pancreatic ductal dilatation or
surrounding inflammatory changes.

Spleen: Normal in size without focal abnormality.

Adrenals/Urinary Tract: The adrenal glands are unremarkable. Focal
cortical scarring and thinning in the upper pole of the left kidney.
Exophytic 1.8 cm simple cyst arising from the upper pole of the left
kidney. No other focal renal lesion. No hydronephrosis. No renal
calculi. The bladder is unremarkable.

Stomach/Bowel: Stomach is within normal limits. Appendix appears
normal. No evidence of bowel wall thickening, distention, or
inflammatory changes.

Lymphatic: No abdominal or pelvic lymphadenopathy.

Reproductive: Prostate is unremarkable.

Other: No free fluid or pneumoperitoneum. Small fat containing left
inguinal hernia.

Musculoskeletal: No acute or significant osseous findings.

Review of the MIP images confirms the above findings.
IMPRESSION: 1. No evidence of acute aortic syndrome in the chest, abdomen, or
pelvis.
2. No acute intrathoracic or intra-abdominal process.

## 2017-08-25 MED ORDER — METHOCARBAMOL 500 MG PO TABS: 500.0000 mg | ORAL_TABLET | Freq: Every evening | ORAL | 0 refills | Status: DC | PRN

## 2017-08-25 MED ORDER — AMLODIPINE BESYLATE 5 MG PO TABS
5.0000 mg | ORAL_TABLET | Freq: Once | ORAL | Status: AC
  Administered 2017-08-25: 5 mg via ORAL
  Filled 2017-08-25: qty 1

## 2017-08-25 MED ORDER — AMLODIPINE BESYLATE 5 MG PO TABS: 5.0000 mg | ORAL_TABLET | Freq: Every day | ORAL | 0 refills | Status: DC

## 2017-08-25 MED ORDER — IOPAMIDOL (ISOVUE-370) INJECTION 76%
INTRAVENOUS | Status: AC
  Administered 2017-08-25: 100 mL
  Filled 2017-08-25: qty 100

## 2017-08-25 MED ORDER — ACETAMINOPHEN 325 MG PO TABS
650.0000 mg | ORAL_TABLET | Freq: Once | ORAL | Status: AC
  Administered 2017-08-25: 650 mg via ORAL
  Filled 2017-08-25: qty 2

## 2017-08-25 MED ORDER — ACETAMINOPHEN 325 MG PO TABS: 650.0000 mg | ORAL_TABLET | Freq: Four times a day (QID) | ORAL | 0 refills | Status: DC | PRN

## 2017-08-25 NOTE — ED Triage Notes (Signed)
Pt arrives from home with son.  Pt's son states, "I don't really know what's wrong with him."  Pt has eyes closed, slumped in wheelchair, responsive to pain.  Pt moaning.  Assessment hampered by language and cultural barrier.

## 2017-08-25 NOTE — Discharge Instructions (Signed)
As discussed, apply heat to your neck and take muscle relaxant only as needed at night time. Do not drive or operate machinery if you take this medication. Tylenol as needed for pain.  It is very important that you follow up with a primary care provider to manage your blood pressure.  Return if you experience worsening, bad headache, chest pain, nausea, vomiting, visual disturbances, dizziness or other new concerning symptoms in the meantime.

## 2017-08-25 NOTE — ED Notes (Signed)
Called lab to add D-dimer on

## 2017-08-25 NOTE — ED Provider Notes (Signed)
[facility] DEPT Provider Note   CSN: [id number] Arrival date & time: 08/25/17  1319     History   Chief Complaint Chief Complaint  Patient presents with  . Altered Mental Status    HPI Eric Ingram is a 56 y.o. male presenting with elevated blood pressure. Patient does not speak english and son at bedside stating that he was last seen completely normal 2 days ago. He is keeping his eyes closed and initially not obeying commands. Although resisting eye opening and jaw opening. Patient opened his eyes to verbal and tracking Dr. Maryan Rued. Son in law explains that he has been feeling unwell for the last 2 days. Reporting that he complained of chest pain and numbness in his legs yesterday and was sweaty.  No interpreter available for patient's language and family does not have very much information on his medical history and specific events leading to today.  HPI  Past Medical History:  Diagnosis Date  . Hypertension     There are no active problems to display for this patient.   History reviewed. No pertinent surgical history.     Home Medications    Prior to Admission medications   Medication Sig Start Date End Date Taking? Authorizing Provider  aspirin-sod bicarb-citric acid (ALKA-SELTZER) 325 MG TBEF tablet Take 325 mg by mouth every 6 (six) hours as needed (for stomach ache).   Yes [provider]  Chlorpheniramine-APAP (CORICIDIN) 2-325 MG TABS Take 1 tablet by mouth daily.   Yes [provider]  acetaminophen (TYLENOL) 325 MG tablet Take 2 tablets (650 mg total) by mouth every 6 (six) hours as needed. 08/25/17   Avie Echevaria B, PA-C  amLODipine (NORVASC) 5 MG tablet Take 1 tablet (5 mg total) by mouth daily. 08/25/17 09/24/17  Avie Echevaria B, PA-C  azithromycin (ZITHROMAX Z-PAK) 250 MG tablet Take 1 tablet (250 mg total) by mouth daily. 07/13/15   Quintella Reichert, MD  lisinopril (PRINIVIL,ZESTRIL) 10 MG tablet Take 10 mg by mouth daily. 07/08/15    [provider]  methocarbamol (ROBAXIN) 500 MG tablet Take 1 tablet (500 mg total) by mouth at bedtime as needed for muscle spasms. 08/25/17   Emeline General, PA-C  traMADol (ULTRAM) 50 MG tablet Take 1 tablet (50 mg total) by mouth every 6 (six) hours as needed for pain. Patient not taking: Reported on 07/12/2015 10/04/13   Carmin Muskrat, MD    Family History No family history on file.  Social History Social History  Substance Use Topics  . Smoking status: Never Smoker  . Smokeless tobacco: Not on file  . Alcohol use No     Allergies   Patient has no known allergies.   Review of Systems Review of Systems  Unable to perform ROS: Mental status change  Cardiovascular: Positive for chest pain.  Neurological: Positive for numbness.     Physical Exam Updated Vital Signs BP (!) 166/111   Pulse 73   Resp 10   Ht 5\' 3"  (1.6 m)   Wt 61.2 kg (135 lb)   SpO2 99%   BMI 23.91 kg/m   Physical Exam  Constitutional: He is oriented to person, place, and time. He appears well-developed and well-nourished. No distress.  HENT:  Head: Normocephalic and atraumatic.  Eyes: Pupils are equal, round, and reactive to light. Conjunctivae and EOM are normal. Right eye exhibits no discharge. Left eye exhibits no discharge. No scleral icterus.  Neck: Normal range of motion. Neck supple.  Cardiovascular: Normal rate, regular  rhythm, normal heart sounds and intact distal pulses.   No murmur heard. Pulmonary/Chest: Effort normal and breath sounds normal. No respiratory distress. He has no wheezes. He has no rales. He exhibits no tenderness.  Abdominal: Soft. He exhibits no distension and no mass. There is no tenderness. There is no rebound and no guarding.  Musculoskeletal: Normal range of motion. He exhibits no edema, tenderness or deformity.  No midline ttp of spine. ttp of trapezius muscle.  Neurological: He is alert and oriented to person, place, and time. He has normal  strength. No cranial nerve deficit or sensory deficit. He exhibits normal muscle tone. Coordination normal. GCS eye subscore is 4. GCS verbal subscore is 5. GCS motor subscore is 6.  Neurologic Exam:  - Mental status: Patient is alert and cooperative. Fluent speech and words are clear. Coherent thought processes and insight is good. Patient is oriented x 4 to person, place, time and event.  - Cranial nerves:  CN III, IV, VI: pupils equally round, reactive to light both direct and conscensual. Full extra-ocular movement. CN V: motor temporalis and masseter strength intact. CN VII : muscles of facial expression intact. CN X :  midline uvula. XI strength of sternocleidomastoid and trapezius muscles 5/5, XII: tongue is midline when protruded. - Motor: No involuntary movements. Muscle tone and bulk normal throughout. Muscle strength is 5/5 in bilateral shoulder abduction, elbow flexion and extension, grip, hip extension, flexion, leg flexion and extension, ankle dorsiflexion and plantar flexion.  - Sensory: Proprioception, light tough sensation intact in all extremities.  - Cerebellar: rapid alternating movements and point to point movement intact in upper and lower extremities. Normal stance and gait.  Skin: Skin is warm and dry. No rash noted. He is not diaphoretic. No erythema. No pallor.  Psychiatric: He has a normal mood and affect.  Nursing note and vitals reviewed.    ED Treatments / Results  Labs (all labs ordered are listed, but only abnormal results are displayed) Labs Reviewed  CBC WITH DIFFERENTIAL/PLATELET - Abnormal; Notable for the following:       Result Value   WBC 14.6 (*)    Hemoglobin 11.9 (*)    HCT 36.2 (*)    MCV 68.2 (*)    MCH 22.4 (*)    Neutro Abs 11.9 (*)    All other components within normal limits  BASIC METABOLIC PANEL - Abnormal; Notable for the following:    Sodium 132 (*)    Chloride 98 (*)    Glucose, Bld 101 (*)    All other components within normal limits   URINALYSIS, ROUTINE W REFLEX MICROSCOPIC - Abnormal; Notable for the following:    Color, Urine STRAW (*)    All other components within normal limits  CBG MONITORING, ED - Abnormal; Notable for the following:    Glucose-Capillary 112 (*)    All other components within normal limits  D-DIMER, QUANTITATIVE (NOT AT [facility])  RAPID URINE DRUG SCREEN, HOSP PERFORMED  ETHANOL  I-STAT TROPONIN, ED  I-STAT TROPONIN, ED    EKG  EKG Interpretation  Date/Time:  Wednesday August 25 2017 13:42:00 EDT Ventricular Rate:  77 PR Interval:    QRS Duration: 88 QT Interval:  378 QTC Calculation: 428 R Axis:   82 Text Interpretation:  Sinus rhythm Borderline ST elevation, anterior leads Normal ECG Confirmed by Blanchie Dessert ([id number]) on 08/25/2017 3:57:00 PM       Radiology Dg Chest 2 View  Result Date: 08/25/2017 CLINICAL DATA:  Initial  evaluation for acute pleuritic chest pain. EXAM: CHEST  2 VIEW COMPARISON:  Prior radiograph from 07/12/2015. FINDINGS: The cardiac and mediastinal silhouettes are stable in size and contour, and remain within normal limits. The lungs are normally inflated. No airspace consolidation, pleural effusion, or pulmonary edema is identified. There is no pneumothorax. No acute osseous abnormality identified. IMPRESSION: No active cardiopulmonary disease. Electronically Signed   By: Jeannine Boga M.D.   On: 08/25/2017 14:42   Ct Head Wo Contrast  Result Date: 08/25/2017 CLINICAL DATA:  35 YOM. Presents with altered mental status. Poor historian, slept through exam EXAM: CT HEAD WITHOUT CONTRAST TECHNIQUE: Contiguous axial images were obtained from the base of the skull through the vertex without intravenous contrast. COMPARISON:  None. FINDINGS: Brain: No evidence of acute infarction, hemorrhage, hydrocephalus, extra-axial collection or mass lesion/mass effect. Vascular: No hyperdense vessel or unexpected calcification. Skull: Normal. Negative for fracture or focal  lesion. Sinuses/Orbits: Near complete opacification of left frontal, sphenoid, and bilateral ethmoid sinuses. Previous bilateral maxillary antrectomy. Mastoid air cells are normally aerated. Orbits unremarkable. Other: None. IMPRESSION: 1. Negative for bleed or other acute intracranial process. 2. Paranasal sinus disease as above. Electronically Signed   By: Lucrezia Europe M.D.   On: 08/25/2017 14:24   Ct Angio Chest/abd/pel For Dissection W And/or Wo Contrast  Result Date: 08/25/2017 CLINICAL DATA:  Chest and back pain. Evaluate for aortic dissection. EXAM: CT ANGIOGRAPHY CHEST, ABDOMEN AND PELVIS TECHNIQUE: Multidetector CT imaging through the chest, abdomen and pelvis was performed using the standard protocol during bolus administration of intravenous contrast. Multiplanar reconstructed images and MIPs were obtained and reviewed to evaluate the vascular anatomy. CONTRAST:  100 cc Isovue 370 intravenous contrast. COMPARISON:  None. FINDINGS: CTA CHEST FINDINGS Cardiovascular: Preferential opacification of the thoracic aorta. No evidence of thoracic aortic aneurysm or dissection. Normal heart size. No pericardial effusion. No pulmonary embolism. Mediastinum/Nodes: No enlarged mediastinal, hilar, or axillary lymph nodes. Thyroid gland and demonstrate no significant findings. Possible mid esophageal diverticulum. Lungs/Pleura: Lungs are clear. No pleural effusion or pneumothorax. Musculoskeletal: No chest wall abnormality. No acute or significant osseous findings. Review of the MIP images confirms the above findings. CTA ABDOMEN AND PELVIS FINDINGS VASCULAR Aorta: Normal caliber aorta without aneurysm, dissection, vasculitis or significant stenosis. Celiac: Patent without evidence of aneurysm, dissection, vasculitis or significant stenosis. SMA: Patent without evidence of aneurysm, dissection, vasculitis or significant stenosis. Renals: Both renal arteries are patent without evidence of aneurysm, dissection,  vasculitis, fibromuscular dysplasia or significant stenosis. IMA: Patent without evidence of aneurysm, dissection, vasculitis or significant stenosis. Inflow: Patent without evidence of aneurysm, dissection, vasculitis or significant stenosis. Veins: No obvious venous abnormality within the limitations of this arterial phase study. Review of the MIP images confirms the above findings. NON-VASCULAR Hepatobiliary: No focal liver abnormality is seen. No gallstones, gallbladder wall thickening, or biliary dilatation. Pancreas: Unremarkable. No pancreatic ductal dilatation or surrounding inflammatory changes. Spleen: Normal in size without focal abnormality. Adrenals/Urinary Tract: The adrenal glands are unremarkable. Focal cortical scarring and thinning in the upper pole of the left kidney. Exophytic 1.8 cm simple cyst arising from the upper pole of the left kidney. No other focal renal lesion. No hydronephrosis. No renal calculi. The bladder is unremarkable. Stomach/Bowel: Stomach is within normal limits. Appendix appears normal. No evidence of bowel wall thickening, distention, or inflammatory changes. Lymphatic: No abdominal or pelvic lymphadenopathy. Reproductive: Prostate is unremarkable. Other: No free fluid or pneumoperitoneum. Small fat containing left inguinal hernia. Musculoskeletal: No acute or significant  osseous findings. Review of the MIP images confirms the above findings. IMPRESSION: 1. No evidence of acute aortic syndrome in the chest, abdomen, or pelvis. 2. No acute intrathoracic or intra-abdominal process. Electronically Signed   By: Titus Dubin M.D.   On: 08/25/2017 17:30    Procedures Procedures (including critical care time)  Medications Ordered in ED Medications  iopamidol (ISOVUE-370) 76 % injection (100 mLs  Contrast Given 08/25/17 1702)  amLODipine (NORVASC) tablet 5 mg (5 mg Oral Given 08/25/17 1642)  acetaminophen (TYLENOL) tablet 650 mg (650 mg Oral Given 08/25/17 1805)      Initial Impression / Assessment and Plan / ED Course  I have reviewed the triage vital signs and the nursing notes.  Pertinent labs & imaging results that were available during my care of the patient were reviewed by me and considered in my medical decision making (see chart for details).     Patient presented unresponsive to verbal stimuli.  On reassessment 5 minutes later, Patient was alert and oriented and answering questions appropriately. Obeying commands and normal neuro exam. Normal CBG  He is complaining of chest pain worse with breathing and feeling tired.  On reassessment and further history gathering from accompanying family members translating. He reported that he felt better than when he came in but described his chest pain as a sharp pain going through his chest to his back. He states that he used to smoke more than 18 years ago when he was back in  [place] but hasn't smoked since.   He denies any history of cardiac disease. He has not been compliant with antihypertensive medications for some time now after running out of his meds.   Given elevated BP and pain radiating to his back, ordered CTa to rule out dissection  Delta trop negative No hx DVT/PE, malignancy, calf pain, swelling, hemoptysis, prolonged immobilization, recent surgery. DIMER negative  CTa negative  Urged patient to follow up with PCP and emphasized the importance of hypertension management and primary care visits.  Blood pressure elevated and patient given home dose amlodipine. Will dc home with amlodipine and PCP follow up. On multiple reassessment, patient reported improvement. He reported a mild nagging headache still and accepted pain medication. Patient reported tightness in his upper back and pain along the trapezius muscle. No midline ttp.  Patient significantly improved while in ED and workup unremarkable. Ambulatory with normal stance and gate in ED. When patient was asked to stand up  and walk, he quickly jumped up out of bed and reported feeling slightly dizzy. Stable and normal stance and gait. Symptoms subsided when he stopped moving rapidly and stood still. Advised oral hydration.  Discussed strict return precautions and advised to return to the emergency department if experiencing any new or worsening symptoms. Instructions were understood and patient agreed with discharge plan. Final Clinical Impressions(s) / ED Diagnoses   Final diagnoses:  Chest pain on breathing  Muscle spasm    New Prescriptions Discharge Medication List as of 08/25/2017  6:29 PM    START taking these medications   Details  acetaminophen (TYLENOL) 325 MG tablet Take 2 tablets (650 mg total) by mouth every 6 (six) hours as needed., Starting Wed 08/25/2017, Print    methocarbamol (ROBAXIN) 500 MG tablet Take 1 tablet (500 mg total) by mouth at bedtime as needed for muscle spasms., Starting Wed 08/25/2017, Print         Emeline General, PA-C 08/25/17 1933    Blanchie Dessert, MD 08/26/17  2101  

## 2017-08-25 NOTE — ED Notes (Signed)
Gave pt heat pack for neck pain. Pt ambulated independently in hall. Gait was steady, pt states he feels slightly "dizzy headed" while walking.

## 2017-09-29 NOTE — Congregational Nurse Program (Signed)
Congregational Nurse Program Note  Date of Encounter: 09/29/2017  Past Medical History: Past Medical History:  Diagnosis Date  . Hypertension     Encounter Details: CN office visit with interpreter Diu Hartshorn assisting. [employer] intern Rayford Halsted assisted patient in completing a Medicaid application.  Jake Michaelis RN, Congregational Nurse [phone]     CNP Questionnaire - 09/29/17 1925      Patient Demographics   Is this a new or existing patient? New   Patient is considered a/an Refugee   Race Asian     Patient Assistance   Location of Patient Assistance Not Applicable   Patient's financial/insurance status Self-Pay (Uninsured)   Uninsured Patient ([facility]) No   Patient referred to apply for the following financial assistance Medicaid   Food insecurities addressed Not Applicable   Transportation assistance No   Assistance securing medications No   Educational health offerings Not Applicable     Encounter Details   Primary purpose of visit Other   Was an Emergency Department visit averted? Not Applicable   Does patient have a medical provider? Yes   Patient referred to Not Applicable   Was a mental health screening completed? (GAINS tool) No   Does patient have dental issues? No   Does patient have vision issues? No   Does your patient have an abnormal blood pressure today? No   Since previous encounter, have you referred patient for abnormal blood pressure that resulted in a new diagnosis or medication change? No   Does your patient have an abnormal blood glucose today? No   Since previous encounter, have you referred patient for abnormal blood glucose that resulted in a new diagnosis or medication change? No   Was there a life-saving intervention made? No

## 2017-10-06 NOTE — Congregational Nurse Program (Signed)
Congregational Nurse Program Note  Date of Encounter: 10/06/2017  Past Medical History: Past Medical History:  Diagnosis Date  . Hypertension     Encounter Details:  CN office visit with interpreter Diu Hartshorn assisting.  Patient brought letter from [facility] which he needed interpreted. We advised patient it stated he has been awarded a 100% [facility] discount.  [employer] intern Rayford Halsted called [facility] and verified that he currently has a zero balance with [facility].  Patient does not have a PCP.  He does have a cardiologist but cannot afford to see him for BP checks because he is not part of the [facility] system.  BP today 158/96.  States he still has BP medicine which he took today.  Referred to [facility] for BP follow-up.  Jake Michaelis RN, Congregational nurse [phone]     CNP Questionnaire - 10/06/17 1956      Patient Demographics   Is this a new or existing patient? Existing   Patient is considered a/an Refugee   Race Asian     Patient Assistance   Location of Patient Assistance Not Applicable   Patient's financial/insurance status [facility]   Uninsured Patient ([facility] Card/Care Connects) No   Patient referred to apply for the following financial assistance Not Applicable   Food insecurities addressed Not Applicable   Transportation assistance No   Assistance securing medications No   Doctor, hospital the healthcare system;Not Applicable;Hypertension;Nutrition     Encounter Details   Primary purpose of visit Other;Navigating the Healthcare System   Was an Emergency Department visit averted? Not Applicable   Does patient have a medical provider? Yes   Patient referred to Establish PCP   Was a mental health screening completed? (GAINS tool) No   Does patient have dental issues? No   Does patient have vision issues? No   Does your patient have an abnormal blood pressure today? No   Since previous encounter, have you referred patient for  abnormal blood pressure that resulted in a new diagnosis or medication change? No   Does your patient have an abnormal blood glucose today? No   Since previous encounter, have you referred patient for abnormal blood glucose that resulted in a new diagnosis or medication change? No   Was there a life-saving intervention made? No

## 2017-10-13 NOTE — Congregational Nurse Program (Signed)
Congregational Nurse Program Note  Date of Encounter: 10/13/2017  Past Medical History: Past Medical History:  Diagnosis Date  . Hypertension     Encounter Details:  CN office visit with interpreter Diu Hartshorn assisting.  Patient needed written and verbal instructions regarding his appointment tomorrow at [facility] at 2:15 pm.  He agreed to pay a $25 fee and will schedule appointment with Bonna Gains, financial counselor since he has no insurance.  Jake Michaelis RN, Congregational Nurse [phone]     CNP Questionnaire - 10/13/17 1936      Patient Demographics   Is this a new or existing patient? Existing   Patient is considered a/an Refugee   Race Asian     Patient Assistance   Location of Patient Assistance Not Applicable   Patient's financial/insurance status [facility]   Uninsured Patient ([facility] Card/Care Connects) No   Patient referred to apply for the following financial assistance Not Applicable   Food insecurities addressed Not Applicable   Transportation assistance No   Assistance securing medications No   Educational health offerings Navigating the healthcare system     Encounter Details   Primary purpose of visit [place]   Was an Emergency Department visit averted? Not Applicable   Does patient have a medical provider? Yes   Patient referred to Not Applicable   Was a mental health screening completed? (GAINS tool) No   Does patient have dental issues? No   Does patient have vision issues? No   Does your patient have an abnormal blood pressure today? No   Since previous encounter, have you referred patient for abnormal blood pressure that resulted in a new diagnosis or medication change? No   Does your patient have an abnormal blood glucose today? No   Since previous encounter, have you referred patient for abnormal blood glucose that resulted in a new diagnosis or medication change? No   Was there a  life-saving intervention made? No

## 2017-10-14 ENCOUNTER — Ambulatory Visit (INDEPENDENT_AMBULATORY_CARE_PROVIDER_SITE_OTHER): Payer: Self-pay | Admitting: Internal Medicine

## 2017-10-14 ENCOUNTER — Encounter (INDEPENDENT_AMBULATORY_CARE_PROVIDER_SITE_OTHER): Payer: Self-pay

## 2017-10-14 ENCOUNTER — Encounter: Payer: Self-pay | Admitting: Internal Medicine

## 2017-10-14 VITALS — BP 151/94 | HR 80 | Temp 98.2°F | Wt 130.6 lb

## 2017-10-14 DIAGNOSIS — M25512 Pain in left shoulder: Secondary | ICD-10-CM

## 2017-10-14 DIAGNOSIS — R093 Abnormal sputum: Secondary | ICD-10-CM

## 2017-10-14 DIAGNOSIS — R509 Fever, unspecified: Secondary | ICD-10-CM

## 2017-10-14 DIAGNOSIS — H9190 Unspecified hearing loss, unspecified ear: Secondary | ICD-10-CM

## 2017-10-14 DIAGNOSIS — M549 Dorsalgia, unspecified: Secondary | ICD-10-CM

## 2017-10-14 DIAGNOSIS — I10 Essential (primary) hypertension: Secondary | ICD-10-CM

## 2017-10-14 DIAGNOSIS — Z6823 Body mass index (BMI) 23.0-23.9, adult: Secondary | ICD-10-CM

## 2017-10-14 DIAGNOSIS — M898X1 Other specified disorders of bone, shoulder: Secondary | ICD-10-CM

## 2017-10-14 DIAGNOSIS — Z0189 Encounter for other specified special examinations: Secondary | ICD-10-CM

## 2017-10-14 DIAGNOSIS — R634 Abnormal weight loss: Secondary | ICD-10-CM

## 2017-10-14 DIAGNOSIS — R05 Cough: Secondary | ICD-10-CM

## 2017-10-14 DIAGNOSIS — M542 Cervicalgia: Secondary | ICD-10-CM

## 2017-10-14 DIAGNOSIS — D509 Iron deficiency anemia, unspecified: Secondary | ICD-10-CM

## 2017-10-14 MED ORDER — AMLODIPINE BESYLATE 5 MG PO TABS: 5.0000 mg | ORAL_TABLET | Freq: Every day | ORAL | 1 refills | Status: DC

## 2017-10-14 MED ORDER — IBUPROFEN 600 MG PO TABS: 600.0000 mg | ORAL_TABLET | Freq: Four times a day (QID) | ORAL | 0 refills | Status: DC | PRN

## 2017-10-14 NOTE — Progress Notes (Signed)
CC: Left shoulder pain  HPI:  Eric Ingram is a 56 y.o. male with a past medical history of HTN here today to establish care. Patient speaks [employer]. History was taken with the assistance of a translator.   Complaints of left shoulder pain for 2 years but acutely worsened over the past 2 weeks. Denies any injuries or falls. Does not recall any inciting events. No heavy lifting or otherwise. Reports a burning/aching pain. Reports it feels hot to touch. Pain radiates to his neck and causes headaches. Has not tried anything for the pain at home. Nothing makes the pain worse but it has progressively worsened. Pain has been constant over the past 2 years. Previously worked at a [employer] but has had to stop work due to pain. Pain is worse with coughing. Reports cough with yellow sputum production. Cough also started 2 years ago as well and has progressively worsened as well. Also notes subjective fevers at home during these times. Reports 4-5 lb weight loss over the last month. Reports shortness of breath associated with fevers and cough. No night sweats, nausea/vomiting, or hemoptysis. Recent CXR on 08/25/17 without any active cardiopulmonary disease. CTA chest obtained at that time (chest pain, concern for dissection) was unremarkable. Lives with his children at home. No recent travel. No sick contacts.  Has Rx for amlodipine 5 mg daily and methocarbamol 500 mg prescribed by the ED on 08/25/17 visit. Appears to have run out over a month ago.  Past Medical History:  Diagnosis Date  . Hypertension    Past Surgical History:  Procedure Laterality Date  . NO PAST SURGERIES     History reviewed. No pertinent family history. Patient reports he is unaware of any family history as his family lives in [place] and does not have access to health care.   Social History   Social History  . Marital status: Married    Spouse name: N/A  . Number of children: N/A  . Years of education: N/A    Occupational History  . Not on file.   Social History Main Topics  . Smoking status: Never Smoker  . Smokeless tobacco: Never Used  . Alcohol use No  . Drug use: No  . Sexual activity: Not on file   Other Topics Concern  . Not on file   Social History Narrative  . No narrative on file   Review of Systems:   Review of Systems  Constitutional: Positive for fever and weight loss. Negative for chills.  HENT: Positive for hearing loss. Negative for ear pain.   Respiratory: Positive for cough and sputum production. Negative for hemoptysis and shortness of breath.   Cardiovascular: Negative for chest pain and palpitations.  Gastrointestinal: Negative for abdominal pain, diarrhea, nausea and vomiting.  Musculoskeletal: Positive for back pain and neck pain. Negative for falls.  Skin: Negative for rash.  Neurological: Negative for dizziness.  Psychiatric/Behavioral: Negative for depression. Nervous/anxious:      Physical Exam:  Vitals:   10/14/17 1425  BP: (!) 151/94  Pulse: 80  Temp: 98.2 F (36.8 C)  TempSrc: Oral  SpO2: 98%  Weight: 130 lb 9.6 oz (59.2 kg)   General: alert, well-developed, and cooperative to examination.  Head: normocephalic and atraumatic.  Eyes: vision grossly intact, pupils equal, pupils round, pupils reactive to light, no injection and anicteric.  Mouth: pharynx pink and moist, no erythema, and no exudates.  Neck: supple, full ROM, no thyromegaly, no JVD, and no carotid bruits.  Lungs: normal respiratory effort, no accessory muscle use, normal breath sounds, no crackles, and no wheezes. Heart: normal rate, regular rhythm, no murmur, no gallop, and no rub.  Abdomen: soft, non-tender, normal bowel sounds, no distention, no guarding, no rebound tenderness, no hepatomegaly, and no splenomegaly.  Msk: no joint swelling, no joint warmth, and no redness over joints. Tenderness to palpation over left scapula. Normal ROM.  Pulses: 2+ DP/PT pulses  bilaterally Extremities: No cyanosis, clubbing, edema Neurologic: alert & oriented X3, no focal deficits Skin: turgor normal and no rashes.  Psych: normal mood and affect   Assessment & Plan:   See Encounters Tab for problem based charting.  Patient discussed with Dr. Evette Doffing

## 2017-10-14 NOTE — Patient Instructions (Signed)
Mr. Ottey,  I would like you to try taking ibuprofen and tylenol for your shoulder pain.  Please take the amlodipine for your blood pressure.   Please meet with Marlana Latus to try to get the [facility].  Follow up with me in 6 weeks.

## 2017-10-15 LAB — FERRITIN: Ferritin: 20 ng/mL — ABNORMAL LOW (ref 30–400)

## 2017-10-16 DIAGNOSIS — M898X1 Other specified disorders of bone, shoulder: Secondary | ICD-10-CM | POA: Insufficient documentation

## 2017-10-16 DIAGNOSIS — I10 Essential (primary) hypertension: Secondary | ICD-10-CM | POA: Insufficient documentation

## 2017-10-16 DIAGNOSIS — D509 Iron deficiency anemia, unspecified: Secondary | ICD-10-CM | POA: Insufficient documentation

## 2017-10-16 NOTE — Assessment & Plan Note (Addendum)
Patient with 2 year history of pain over his left scapula. Shoulder strength intact with good ROM; no evidence of rotator cuff injury. No tenderness to palpation over cervical spine. Patient does not describe radicular type pain and has intact strength and sensation.   Most likely arthritic pain. Will do trial with conservative therapies with NSAIDs, ice/heat. Consider PT in the future. Could consider imaging with c-spine MRI if no improvement thought cost considerations will likely be constraining.

## 2017-10-16 NOTE — Assessment & Plan Note (Signed)
BP Readings from Last 3 Encounters:  10/14/17 (!) 151/94  10/06/17 (!) 158/96  08/25/17 (!) 166/111    Lab Results  Component Value Date   NA 132 (L) 08/25/2017   K 4.0 08/25/2017   CREATININE 1.11 08/25/2017   Patient with hypertension at several previous ED visits. BP 151/94 today off any anti-hypertensives. Had been started on amlodipine 5 mg daily by ED at last visit but has run out of the medication due to not having additional refills prescribed. Reports no side effects from the medication.   A/P: Will restart amlodipine 5 mg day. Follow up 4-6 weeks for re-check.

## 2017-10-16 NOTE — Assessment & Plan Note (Signed)
Patient with microcytic anemia on prior labs from ED visits. Denies any hemoptysis, hematuria, hematochezia or melena.   A/P Will check ferrtin today to assess for iron deficiency anemia. If suggestives of iron deficiency, would need diagnostic colonoscopy. If ferritin is normal, would likely suggestive alpha thalassemia and could consider hgb electrophoresis for confirmation once patient is able to get orange card to afford additional testing.  Addendum: Ferritin 10 suggestive of iron deficiency anemia. Will need urgent referral for diagnostic colonoscopy.

## 2017-10-18 NOTE — Progress Notes (Signed)
Internal Medicine Clinic Attending  Case discussed with Dr. Boswell at the time of the visit.  We reviewed the resident's history and exam and pertinent patient test results.  I agree with the assessment, diagnosis, and plan of care documented in the resident's note.  

## 2017-10-28 ENCOUNTER — Ambulatory Visit: Payer: Self-pay

## 2017-11-11 ENCOUNTER — Ambulatory Visit: Payer: Self-pay

## 2017-12-14 HISTORY — PX: COLONOSCOPY: SHX174

## 2017-12-22 NOTE — Congregational Nurse Program (Signed)
Congregational Nurse Program Note  Date of Encounter: 12/22/2017  Past Medical History: Past Medical History:  Diagnosis Date  . Hypertension     Encounter Details:  CN office visit with interpreter Diu Hartshorn assisting.  Patient states he is having dental pain as well as chronic left ear and nasal problems.  He is concerned that he does not have health insurance.  Phone call to Bonna Gains, financial counselor who saw patient in Nov. 2018.  She states she still needs the following information from patient:  2017 state and federal tax returns,  2018 earnings thru June 2018 when patient stopped working, and a Personnel officer from his daughter stating how much rent and utilities he helps her pay each month. CN and interpreter called his daughter and advised her of this.  Jake Michaelis, RN, Congregational Nurse [phone] CNP Questionnaire - 12/22/17 1937      Questionnaire   Patient Status  Refugee    Race  Asian    Location Patient Served At  Not Applicable    Insurance  Not Applicable    Uninsured  Uninsured (Subsequent visits/quarter)    Food  No food insecurities    Housing/Utilities  Yes, have permanent housing    Transportation  No transportation needs    Interpersonal Safety  Yes, feel physically and emotionally safe where you currently live    Medication  No medication insecurities    Medical Provider  Yes    Referrals  Not Applicable    ED Visit Averted  Not Applicable

## 2018-01-05 ENCOUNTER — Ambulatory Visit (INDEPENDENT_AMBULATORY_CARE_PROVIDER_SITE_OTHER): Payer: Self-pay | Admitting: Internal Medicine

## 2018-01-05 ENCOUNTER — Encounter: Payer: Self-pay | Admitting: Internal Medicine

## 2018-01-05 ENCOUNTER — Other Ambulatory Visit: Payer: Self-pay

## 2018-01-05 ENCOUNTER — Telehealth: Payer: Self-pay | Admitting: *Deleted

## 2018-01-05 VITALS — BP 134/82 | HR 77 | Temp 97.9°F | Wt 140.1 lb

## 2018-01-05 DIAGNOSIS — Z0000 Encounter for general adult medical examination without abnormal findings: Secondary | ICD-10-CM | POA: Insufficient documentation

## 2018-01-05 DIAGNOSIS — Z7289 Other problems related to lifestyle: Secondary | ICD-10-CM

## 2018-01-05 DIAGNOSIS — D509 Iron deficiency anemia, unspecified: Secondary | ICD-10-CM

## 2018-01-05 DIAGNOSIS — Z79899 Other long term (current) drug therapy: Secondary | ICD-10-CM

## 2018-01-05 DIAGNOSIS — J329 Chronic sinusitis, unspecified: Secondary | ICD-10-CM | POA: Insufficient documentation

## 2018-01-05 DIAGNOSIS — I10 Essential (primary) hypertension: Secondary | ICD-10-CM

## 2018-01-05 DIAGNOSIS — Z23 Encounter for immunization: Secondary | ICD-10-CM

## 2018-01-05 MED ORDER — SALINE SPRAY 0.65 % NA SOLN: 1.0000 | NASAL | 0 refills | Status: DC | PRN

## 2018-01-05 MED ORDER — AMLODIPINE BESYLATE 5 MG PO TABS: 5.0000 mg | ORAL_TABLET | Freq: Every day | ORAL | 1 refills | Status: DC

## 2018-01-05 MED ORDER — FLUTICASONE PROPIONATE 50 MCG/ACT NA SUSP: 1.0000 | Freq: Every day | NASAL | 0 refills | Status: DC

## 2018-01-05 MED ORDER — AMOXICILLIN-POT CLAVULANATE 875-125 MG PO TABS: 1.0000 | ORAL_TABLET | Freq: Two times a day (BID) | ORAL | 0 refills | Status: AC

## 2018-01-05 NOTE — Progress Notes (Signed)
   CC: For follow-up of his hypertension.  HPI:  Mr.Marton Lathon is a 57 y.o. with past medical history as listed below came to the clinic for follow-up of his blood pressure. Patient speaks [place] and an interpreter was used for communication.  He was complaining of left-sided nasal congestion and facial pain for more than a month.  He is having yellowish mucus with occasional streaking of blood.  He was also complaining of headache and left ear pain.  He did had some sore throat and upper respiratory symptoms a month ago which has been resolved.  He was also complaining of some hearing loss more on the left with a feeling of fullness in left ear.  He denies any fever or chills.  He denies any change in his appetite or weight. He denies any reflux symptoms. He denies any hematuria or blood in stool. He was diagnosed with iron deficiency anemia during previous clinic visit and was referred for colonoscopy, patient was not really aware of that, most likely due to very poor health literacy and communication barrier.  Past Medical History:  Diagnosis Date  . Hypertension    Review of Systems:  As per HPI.  Physical Exam:  Vitals:   01/05/18 1404  BP: 134/82  Pulse: 77  Temp: 97.9 F (36.6 C)  TempSrc: Oral  SpO2: 99%  Weight: 140 lb 1.6 oz (63.5 kg)    General: Vital signs reviewed.  Patient is well-developed and well-nourished, in no acute distress and cooperative with exam.  HEENT: Normocephalic and atraumatic, moderately severe erythema and edema of left nasal turbinate.  Tympanic membrane with good reflex bilaterally, mild left maxillary sinus tenderness, no pharyngeal exudate or erythema. Neck: Supple, trachea midline, normal ROM, no JVD, masses, thyromegaly, or carotid bruit present.  Cardiovascular: RRR, S1 normal, S2 normal, no murmurs, gallops, or rubs. Pulmonary/Chest: Clear to auscultation bilaterally, no wheezes, rales, or rhonchi. Abdominal: Soft, non-tender,  non-distended, BS +, no masses, organomegaly, or guarding present.  Extremities: No lower extremity edema bilaterally,  pulses symmetric and intact bilaterally. No cyanosis or clubbing. Skin: Warm, dry and intact. No rashes or erythema. Psychiatric: Normal mood and affect. speech and behavior is normal. Cognition and memory are normal.  Assessment & Plan:   See Encounters Tab for problem based charting.  Patient discussed with Dr. Daryll Drown.

## 2018-01-05 NOTE — Assessment & Plan Note (Signed)
He was given a referral for colonoscopy, apparently he did not know about that and never made an appointment.  It appears that he has very low health literacy and a language barrier also plays a role.  We will follow-up on that referral. If unable to make an appointment, or cost issues because of lack of insurance we can try FOBT.

## 2018-01-05 NOTE — Assessment & Plan Note (Signed)
BP Readings from Last 3 Encounters:  01/05/18 134/82  12/22/17 128/90  10/14/17 (!) 151/94   His blood pressure was within goal today. His headache was most likely related to his sinusitis.  Continue amlodipine 5 mg daily.

## 2018-01-05 NOTE — Telephone Encounter (Signed)
[facility] states there were no scripts rec'd by them today, checked medlist, sent to [facility], changed pharm, gave scripts verbally to teresa at [facility]

## 2018-01-05 NOTE — Assessment & Plan Note (Signed)
His symptoms and exam were more consistent with left sided sinusitis, but because of the length of his symptoms, we will try a course of antibiotic.  If his symptoms fail to resolved he will get benefit with referral to ENT.  -Augmentin for 10 days. -Flonase nasal spray -Normal saline nasal spray.

## 2018-01-05 NOTE — Assessment & Plan Note (Signed)
We will check for HIV and hep C today. Colonoscopy referral was provided in November 2018-we will follow-up on that.

## 2018-01-05 NOTE — Patient Instructions (Addendum)
Thank you for visiting clinic today. I am giving you a 10-day course of Augmentin, it is an antibiotic for your sinus problem, please take it as directed. I am also giving you 2 types of nasal sprays, please use them as directed. Your blood pressure seems good today, I am sending another refill request for your blood pressure medicine to your pharmacy, please continue using your amlodipine 5 mg daily. If your hearing and sinus problem does not improve please let us know and we can refer you to a specialist. We will follow-up with your colonoscopy and will call you with appointment. Please follow-up in 1 month or earlier if your symptoms fail to resolve.

## 2018-01-06 LAB — HEPATITIS C ANTIBODY: Hep C Virus Ab: <0.1 s/co ratio (ref 0.0–0.9)

## 2018-01-06 LAB — HIV ANTIBODY (ROUTINE TESTING W REFLEX): HIV SCREEN 4TH GENERATION: NONREACTIVE

## 2018-01-06 NOTE — Progress Notes (Signed)
Internal Medicine Clinic Attending  Case discussed with Dr. Amin at the time of the visit.  We reviewed the resident's history and exam and pertinent patient test results.  I agree with the assessment, diagnosis, and plan of care documented in the resident's note.    

## 2018-01-22 ENCOUNTER — Emergency Department (HOSPITAL_COMMUNITY): Payer: Self-pay

## 2018-01-22 ENCOUNTER — Encounter (HOSPITAL_COMMUNITY): Payer: Self-pay

## 2018-01-22 ENCOUNTER — Other Ambulatory Visit: Payer: Self-pay

## 2018-01-22 ENCOUNTER — Emergency Department (HOSPITAL_COMMUNITY)
Admission: EM | Admit: 2018-01-22 | Discharge: 2018-01-22 | Disposition: A | Discharge status: Home / Self Care | Payer: Self-pay | Attending: Emergency Medicine | Admitting: Emergency Medicine

## 2018-01-22 DIAGNOSIS — R072 Precordial pain: Secondary | ICD-10-CM | POA: Insufficient documentation

## 2018-01-22 DIAGNOSIS — I10 Essential (primary) hypertension: Secondary | ICD-10-CM | POA: Insufficient documentation

## 2018-01-22 DIAGNOSIS — R197 Diarrhea, unspecified: Secondary | ICD-10-CM | POA: Insufficient documentation

## 2018-01-22 DIAGNOSIS — R112 Nausea with vomiting, unspecified: Secondary | ICD-10-CM | POA: Insufficient documentation

## 2018-01-22 DIAGNOSIS — Z79899 Other long term (current) drug therapy: Secondary | ICD-10-CM | POA: Insufficient documentation

## 2018-01-22 DIAGNOSIS — R1084 Generalized abdominal pain: Secondary | ICD-10-CM | POA: Insufficient documentation

## 2018-01-22 LAB — URINALYSIS, ROUTINE W REFLEX MICROSCOPIC
Bilirubin Urine: NEGATIVE
Glucose, UA: NEGATIVE mg/dL
Hgb urine dipstick: NEGATIVE
KETONES UR: NEGATIVE mg/dL
LEUKOCYTES UA: NEGATIVE
NITRITE: NEGATIVE
PROTEIN: NEGATIVE mg/dL
Specific Gravity, Urine: 1.009 (ref 1.005–1.030)
pH: 8 (ref 5.0–8.0)

## 2018-01-22 LAB — I-STAT TROPONIN, ED: Troponin i, poc: 0 ng/mL (ref 0.00–0.08)

## 2018-01-22 LAB — COMPREHENSIVE METABOLIC PANEL
ALT: 15 U/L — AB (ref 17–63)
AST: 32 U/L (ref 15–41)
Albumin: 4.3 g/dL (ref 3.5–5.0)
Alkaline Phosphatase: 73 U/L (ref 38–126)
Anion gap: 14 (ref 5–15)
BILIRUBIN TOTAL: 0.9 mg/dL (ref 0.3–1.2)
BUN: 12 mg/dL (ref 6–20)
CALCIUM: 9.5 mg/dL (ref 8.9–10.3)
CHLORIDE: 98 mmol/L — AB (ref 101–111)
CO2: 28 mmol/L (ref 22–32)
CREATININE: 1.27 mg/dL — AB (ref 0.61–1.24)
Glucose, Bld: 106 mg/dL — ABNORMAL HIGH (ref 65–99)
Potassium: 3.4 mmol/L — ABNORMAL LOW (ref 3.5–5.1)
Sodium: 140 mmol/L (ref 135–145)
TOTAL PROTEIN: 8.9 g/dL — AB (ref 6.5–8.1)

## 2018-01-22 LAB — CBC
HCT: 40.3 % (ref 39.0–52.0)
Hemoglobin: 13.6 g/dL (ref 13.0–17.0)
MCH: 23.1 pg — ABNORMAL LOW (ref 26.0–34.0)
MCHC: 33.7 g/dL (ref 30.0–36.0)
MCV: 68.5 fL — AB (ref 78.0–100.0)
PLATELETS: 362 10*3/uL (ref 150–400)
RBC: 5.88 MIL/uL — AB (ref 4.22–5.81)
RDW: 14.3 % (ref 11.5–15.5)
WBC: 8.3 10*3/uL (ref 4.0–10.5)

## 2018-01-22 LAB — LIPASE, BLOOD: LIPASE: 43 U/L (ref 11–51)

## 2018-01-22 IMAGING — DX DG CHEST 2V
2 series · 2 of 2 positions shown · non-contrast
Comparison: Chest CT and chest radiograph [DATE]

CLINICAL DATA: Nausea and vomiting with pain

EXAM:
CHEST  2 VIEW

[chest pa]
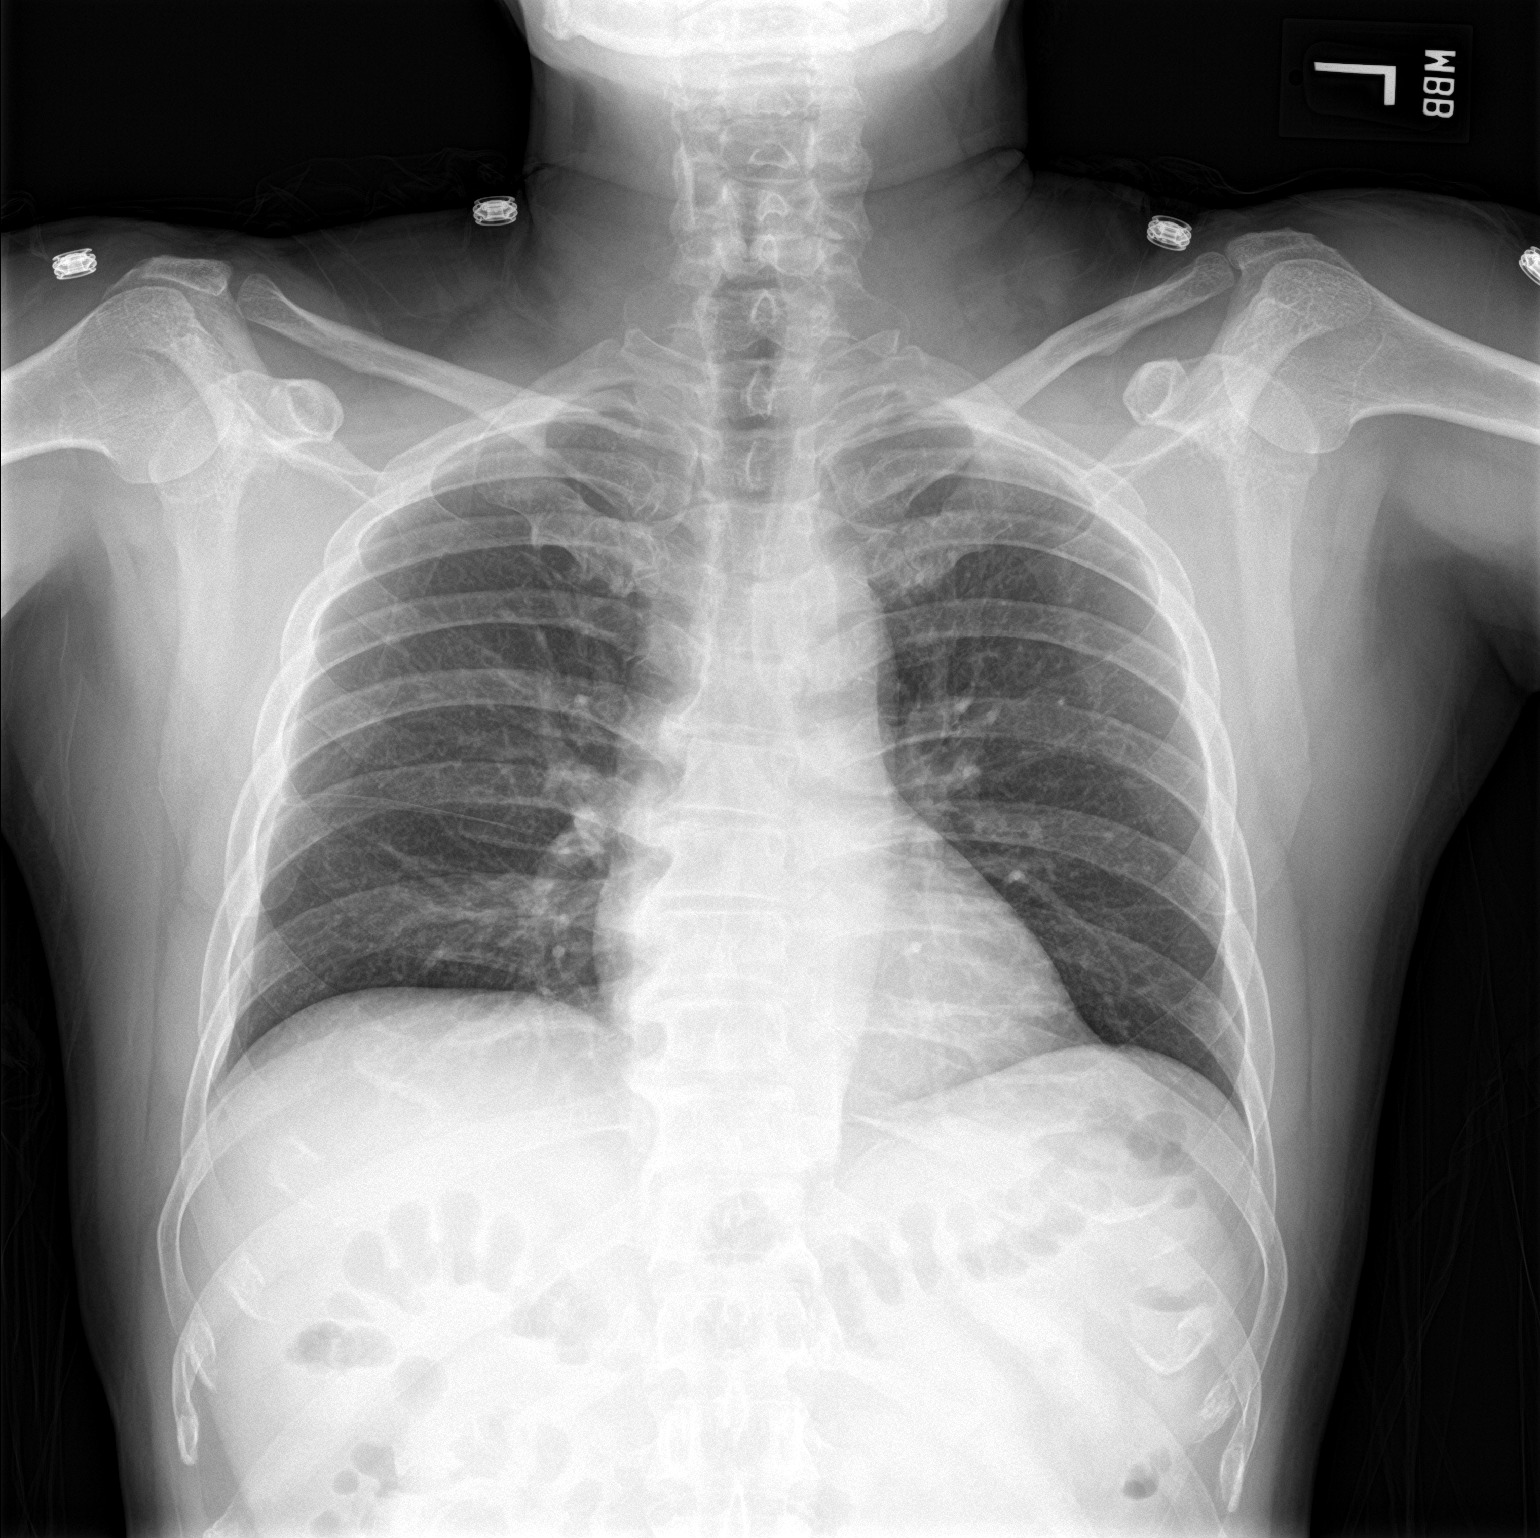

[chest lat]
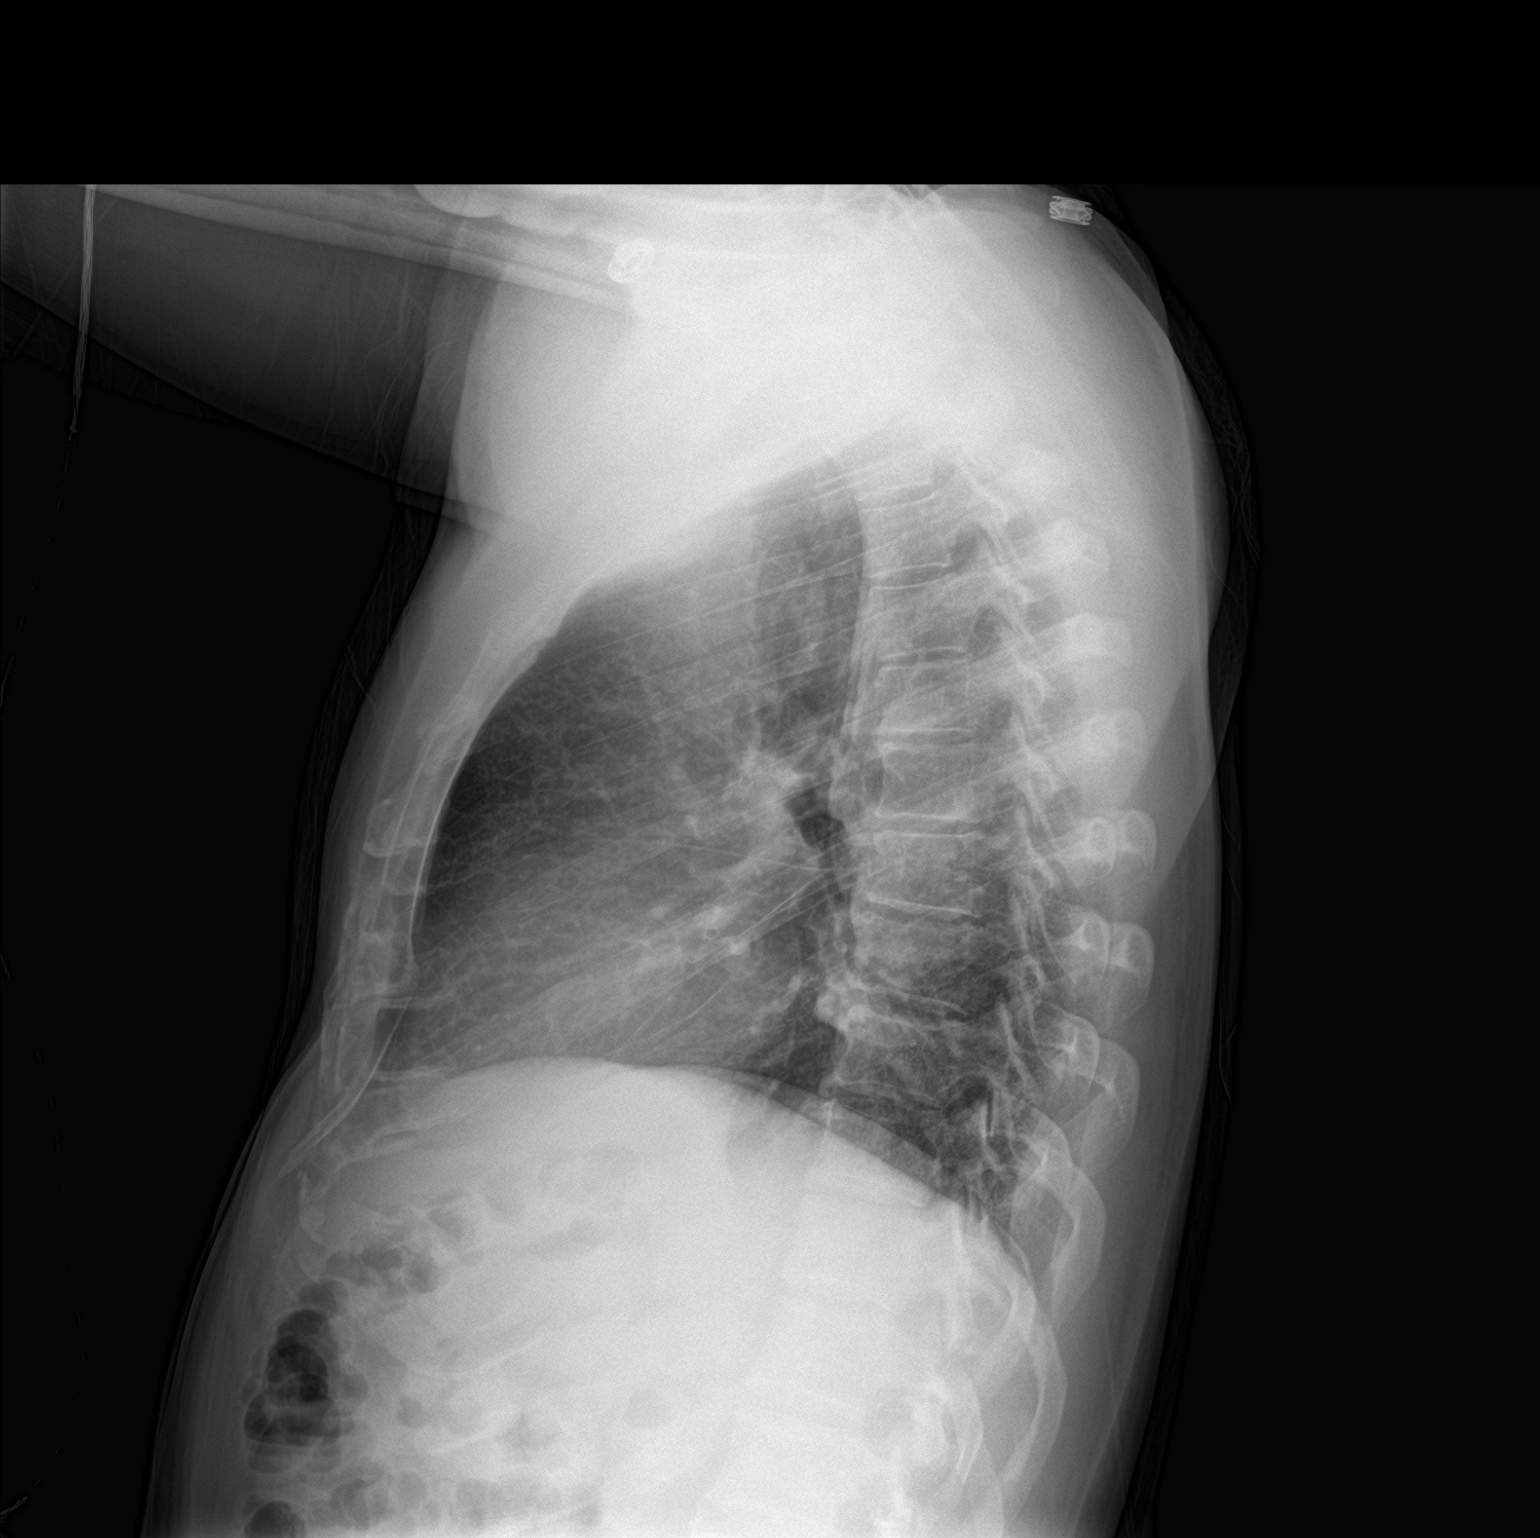

[2 of 2 positions shown; findings below may reference images not displayed]

FINDINGS: Lungs are clear. Heart size and pulmonary vascularity are normal. No
adenopathy. There are foci of degenerative change in the thoracic
spine. No pneumothorax.
IMPRESSION: No edema or consolidation.

## 2018-01-22 MED ORDER — ONDANSETRON 4 MG PO TBDP
4.0000 mg | ORAL_TABLET | Freq: Once | ORAL | Status: AC | PRN
  Administered 2018-01-22: 4 mg via ORAL
  Filled 2018-01-22: qty 1

## 2018-01-22 MED ORDER — DICYCLOMINE HCL 20 MG PO TABS: 20.0000 mg | ORAL_TABLET | Freq: Two times a day (BID) | ORAL | 0 refills | Status: DC

## 2018-01-22 MED ORDER — DICYCLOMINE HCL 10 MG PO CAPS
10.0000 mg | ORAL_CAPSULE | Freq: Once | ORAL | Status: AC
  Administered 2018-01-22: 10 mg via ORAL
  Filled 2018-01-22: qty 1

## 2018-01-22 MED ORDER — ONDANSETRON 4 MG PO TBDP: 4.0000 mg | ORAL_TABLET | Freq: Three times a day (TID) | ORAL | 0 refills | Status: DC | PRN

## 2018-01-22 MED ORDER — SODIUM CHLORIDE 0.9 % IV BOLUS (SEPSIS)
1000.0000 mL | Freq: Once | INTRAVENOUS | Status: AC
  Administered 2018-01-22: 1000 mL via INTRAVENOUS

## 2018-01-22 MED ORDER — LOPERAMIDE HCL 2 MG PO CAPS: 2.0000 mg | ORAL_CAPSULE | Freq: Four times a day (QID) | ORAL | 0 refills | Status: DC | PRN

## 2018-01-22 NOTE — Discharge Instructions (Signed)

## 2018-01-22 NOTE — ED Provider Notes (Signed)
Emergency Department Provider Note   I have reviewed the triage vital signs and the nursing notes.   HISTORY  Chief Complaint Abdominal Pain   HPI Demaryius Imran is a 57 y.o. male with PMH of HTN presents to the emergency department with 3 days of generalized abdominal discomfort with nausea, vomiting, diarrhea.  No blood in the stool or emesis.  The patient has some radiation of symptoms up into the chest and around the back.  No modifying factors other than eating food which causes vomiting.  Chest pain is not pleuritic or exertional.  No productive cough, hemoptysis, or difficulty breathing.  No sick contacts or recent travels.   Past Medical History:  Diagnosis Date  . Hypertension     Patient Active Problem List   Diagnosis Date Noted  . Chronic sinusitis 01/05/2018  . Health care maintenance 01/05/2018  . Microcytic anemia 10/16/2017  . Pain of left scapula 10/16/2017  . Hypertension 10/16/2017    Past Surgical History:  Procedure Laterality Date  . NO PAST SURGERIES      Current Outpatient Rx  . Order #: [id number] Class: Normal  . Order #: [id number] Class: Print  . Order #: [id number] Class: Normal  . Order #: [id number] Class: Normal  . Order #: [id number] Class: Print  . Order #: [id number] Class: Print  . Order #: [id number] Class: Normal    Allergies Patient has no known allergies.  Family History  Family history unknown: Yes    Social History Social History   Tobacco Use  . Smoking status: Never Smoker  . Smokeless tobacco: Never Used  Substance Use Topics  . Alcohol use: No  . Drug use: No    Review of Systems  Constitutional: No fever/chills Eyes: No visual changes. ENT: No sore throat. Cardiovascular: Positive chest pain. Respiratory: Denies shortness of breath. Gastrointestinal: Positive abdominal pain. Positive nausea, vomiting, and diarrhea.  No constipation. Genitourinary: Negative for dysuria. Musculoskeletal: Negative for back  pain. Skin: Negative for rash. Neurological: Negative for headaches, focal weakness or numbness.  10-point ROS otherwise negative.  ____________________________________________   PHYSICAL EXAM:  VITAL SIGNS: ED Triage Vitals [01/22/18 1140]  Enc Vitals Group     BP (!) 161/101     Pulse Rate 69     Resp 18     Temp (!) 97.4 F (36.3 C)     Temp Source Oral     SpO2 100 %   Constitutional: Alert and oriented. Well appearing and in no acute distress. Eyes: Conjunctivae are normal. Head: Atraumatic. Nose: No congestion/rhinnorhea. Mouth/Throat: Mucous membranes are moist.  Neck: No stridor.   Cardiovascular: Normal rate, regular rhythm. Good peripheral circulation. Grossly normal heart sounds.   Respiratory: Normal respiratory effort.  No retractions. Lungs CTAB. Gastrointestinal: Soft and nontender. No distention.  Musculoskeletal: No lower extremity tenderness nor edema. No gross deformities of extremities. Neurologic:  Normal speech and language. No gross focal neurologic deficits are appreciated.  Skin:  Skin is warm, dry and intact. No rash noted.  ____________________________________________   LABS (all labs ordered are listed, but only abnormal results are displayed)  Labs Reviewed  COMPREHENSIVE METABOLIC PANEL - Abnormal; Notable for the following components:      Result Value   Potassium 3.4 (*)    Chloride 98 (*)    Glucose, Bld 106 (*)    Creatinine, Ser 1.27 (*)    Total Protein 8.9 (*)    ALT 15 (*)    All other components within normal limits  CBC - Abnormal;  Notable for the following components:   RBC 5.88 (*)    MCV 68.5 (*)    MCH 23.1 (*)    All other components within normal limits  URINALYSIS, ROUTINE W REFLEX MICROSCOPIC - Abnormal; Notable for the following components:   APPearance CLOUDY (*)    All other components within normal limits  LIPASE, BLOOD  I-STAT TROPONIN, ED   ____________________________________________  EKG   EKG  Interpretation  Date/Time:  Saturday January 22 2018 14:54:11 EST Ventricular Rate:  58 PR Interval:    QRS Duration: 89 QT Interval:  405 QTC Calculation: 398 R Axis:   73 Text Interpretation:  Sinus rhythm Borderline T abnormalities, inferior leads No significant change since last tracing Confirmed by Virgel Manifold [phone]) on 01/22/2018 3:13:08 PM       ____________________________________________  RADIOLOGY  Dg Chest 2 View  Result Date: 01/22/2018 CLINICAL DATA:  Nausea and vomiting with pain EXAM: CHEST  2 VIEW COMPARISON:  Chest CT and chest radiograph August 25, 2017 FINDINGS: Lungs are clear. Heart size and pulmonary vascularity are normal. No adenopathy. There are foci of degenerative change in the thoracic spine. No pneumothorax. IMPRESSION: No edema or consolidation. Electronically Signed   By: Lowella Grip III M.D.   On: 01/22/2018 14:04    ____________________________________________   PROCEDURES  Procedure(s) performed:   Procedures  None ____________________________________________   INITIAL IMPRESSION / ASSESSMENT AND PLAN / ED COURSE  Pertinent labs & imaging results that were available during my care of the patient were reviewed by me and considered in my medical decision making (see chart for details).  Patient presents to the emergency department for evaluation of generalized abdominal discomfort with nausea, vomiting, diarrhea.  Abdomen is soft and nontender to palpation.  Suspect viral etiology.  Some radiation of symptoms up into the chest.  Labs from triage reviewed.  Will add troponin and chest x-ray along with EKG but suspect GI etiology.  On re-evaluation the patient's troponin is negative. EKG reviewed along with CXR. Feeling better after IVF and meds. Plan for discharge with PCP follow up.   At this time, I do not feel there is any life-threatening condition present. I have reviewed and discussed all results (EKG, imaging, lab, urine as  appropriate), exam findings with patient. I have reviewed nursing notes and appropriate previous records.  I feel the patient is safe to be discharged home without further emergent workup. Discussed usual and customary return precautions. Patient and family (if present) verbalize understanding and are comfortable with this plan.  Patient will follow-up with their primary care provider. If they do not have a primary care provider, information for follow-up has been provided to them. All questions have been answered.  ____________________________________________  FINAL CLINICAL IMPRESSION(S) / ED DIAGNOSES  Final diagnoses:  Nausea vomiting and diarrhea  Precordial chest pain     MEDICATIONS GIVEN DURING THIS VISIT:  Medications  ondansetron (ZOFRAN-ODT) disintegrating tablet 4 mg (4 mg Oral Given 01/22/18 1146)  sodium chloride 0.9 % bolus 1,000 mL (0 mLs Intravenous Stopped 01/22/18 1604)  dicyclomine (BENTYL) capsule 10 mg (10 mg Oral Given 01/22/18 1512)     NEW OUTPATIENT MEDICATIONS STARTED DURING THIS VISIT:  Discharge Medication List as of 01/22/2018  3:21 PM    START taking these medications   Details  dicyclomine (BENTYL) 20 MG tablet Take 1 tablet (20 mg total) by mouth 2 (two) times daily., Starting Sat 01/22/2018, Print    loperamide (IMODIUM) 2 MG capsule Take 1  capsule (2 mg total) by mouth 4 (four) times daily as needed for diarrhea or loose stools., Starting Sat 01/22/2018, Print    ondansetron (ZOFRAN ODT) 4 MG disintegrating tablet Take 1 tablet (4 mg total) by mouth every 8 (eight) hours as needed for nausea or vomiting., Starting Sat 01/22/2018, Print        Note:  This document was prepared using Dragon voice recognition software and may include unintentional dictation errors.  Nanda Quinton, MD Emergency Medicine    Morgann Woodburn, [facility], MD 01/22/18 [phone]

## 2018-01-22 NOTE — ED Triage Notes (Addendum)
Pt presents with 3 day h/o generalized abdominal pain with nausea, vomiting and diarrhea.  Pt reports pain radiates up into his chest and around to his back.

## 2018-01-26 NOTE — Congregational Nurse Program (Signed)
Congregational Nurse Program Note  Date of Encounter: 01/26/2018  Past Medical History: Past Medical History:  Diagnosis Date  . Hypertension     Encounter Details:  CN office visit with interpreter Diu Hartshorn assisting.  He brought a bill from [facility]. We faxed a copy of his [facility] discount letter along with the bill to [facility]. Since they honor the [facility] discount his bill should be adjusted to 0 balance.  Patient states he is feeling better since being treated at [facility].  He expressed his appreciation for our help.  Jake Michaelis RN, Congregational Nurse [phone]  CNP Questionnaire - 01/26/18 1931      Questionnaire   Patient Status  Refugee    Race  Asian    Location Patient Served At  Not Applicable    Insurance  Not Applicable    Uninsured  Uninsured (Subsequent visits/quarter)    Food  No food insecurities    Housing/Utilities  Yes, have permanent housing    Transportation  No transportation needs    Interpersonal Safety  Yes, feel physically and emotionally safe where you currently live    Medication  No medication insecurities    Medical Provider  Yes    Referrals  Not Applicable    ED Visit Averted  Not Applicable    Life-Saving Intervention Made  Not Applicable      Clinical Intake - 01/05/18 1405      Pre-visit preparation   Pre-visit preparation completed  Yes      Pain   Pain   No/denies pain      Nutrition Screen   BMI - recorded  24.82    Nutritional Status  BMI of 19-24  Normal    Nutritional Risks  None    Diabetes  No      Functional Status   Activities of Daily Living  Independent    Ambulation  Independent    Medication Administration  Independent    Home Management  Independent      Risk/Barriers   Barriers to Care Management & Learning  Language montagnard rhade   montagnard rhade     Abuse/Neglect   Do you feel unsafe in your current relationship?  No    Do you feel physically threatened by others?  No    Anyone hurting you at home, work, or school?  No    Unable to ask?  No      Patient Literacy   How often do you need to have someone help you when you read instructions, pamphlets, or other written materials from your doctor or pharmacy?  5 - Always      Language Assistant   Interpreter Needed?  Yes    Interpreter Agency  language resources    Interpreter Name  Adah Perl    Patient Declined Interpreter   No    Patient signed [facility] waiver  No      Comments   Information entered by :  Modena Morrow 01/05/2018 1406

## 2018-01-28 ENCOUNTER — Other Ambulatory Visit: Payer: Self-pay

## 2018-01-28 ENCOUNTER — Encounter: Payer: Self-pay | Admitting: Internal Medicine

## 2018-01-28 ENCOUNTER — Ambulatory Visit (INDEPENDENT_AMBULATORY_CARE_PROVIDER_SITE_OTHER): Payer: Self-pay | Admitting: Internal Medicine

## 2018-01-28 VITALS — BP 138/97 | HR 96 | Temp 98.1°F | Wt 137.7 lb

## 2018-01-28 DIAGNOSIS — R140 Abdominal distension (gaseous): Secondary | ICD-10-CM

## 2018-01-28 DIAGNOSIS — M898X1 Other specified disorders of bone, shoulder: Secondary | ICD-10-CM

## 2018-01-28 DIAGNOSIS — K5900 Constipation, unspecified: Secondary | ICD-10-CM

## 2018-01-28 DIAGNOSIS — Z5189 Encounter for other specified aftercare: Secondary | ICD-10-CM

## 2018-01-28 DIAGNOSIS — R6881 Early satiety: Secondary | ICD-10-CM

## 2018-01-28 DIAGNOSIS — M25512 Pain in left shoulder: Secondary | ICD-10-CM

## 2018-01-28 DIAGNOSIS — Z79899 Other long term (current) drug therapy: Secondary | ICD-10-CM

## 2018-01-28 DIAGNOSIS — Z8719 Personal history of other diseases of the digestive system: Secondary | ICD-10-CM

## 2018-01-28 DIAGNOSIS — I10 Essential (primary) hypertension: Secondary | ICD-10-CM

## 2018-01-28 DIAGNOSIS — D509 Iron deficiency anemia, unspecified: Secondary | ICD-10-CM

## 2018-01-28 MED ORDER — AMLODIPINE BESYLATE 10 MG PO TABS: 10.0000 mg | ORAL_TABLET | Freq: Every day | ORAL | 1 refills | Status: DC

## 2018-01-28 NOTE — Progress Notes (Signed)
   CC: For his emergency room visit follow-up because of nausea, vomiting and diarrhea.  HPI:  Eric Ingram is a 57 y.o. past medical history significant for hypertension came to the clinic today for his emergency room visit follow-up. Patient speaks [employer]. History was taken with the assistance of a translator.   He was seen in emergency room on January 22, 2018 with complaint of nausea vomiting and diarrhea, diagnosed with viral gastroenteritis, got some IV fluid and supportive care.  His acute symptoms which include nausea, vomiting and diarrhea has been improved.  He was complaining of excessive bloatedness and early satiety.  He is now constipated most of the time.  He denies any melena, hematochezia or hematuria.  He is not sure about weight loss, he has a documented weight loss of about 3 pound in 3 weeks.  He was found to have microcytic anemia during his previous office visit in November 2019, with ferritin level of 20, was given a referral to see a gastroenterologist, patient has not made an appointment yet.  He has a orange card-he was provided with a new urgent referral and we will try to make an appointment as soon as possible.  Past Medical History:  Diagnosis Date  . Hypertension    Review of Systems: Negative except mentioned in HPI.  Physical Exam:  Vitals:   01/28/18 1507  BP: (!) 138/97  Pulse: 96  Temp: 98.1 F (36.7 C)  TempSrc: Oral  SpO2: 99%  Weight: 137 lb 11.2 oz (62.5 kg)    General: Vital signs reviewed.  Patient is well-developed, in no acute distress and cooperative with exam.  Head: Normocephalic and atraumatic. Eyes: EOMI, conjunctivae normal, no scleral icterus.  Cardiovascular: RRR, S1 normal, S2 normal, no murmurs, gallops, or rubs. Pulmonary/Chest: Clear to auscultation bilaterally, no wheezes, rales, or rhonchi. Abdominal: Soft, mild diffuse tenderness, non-distended, BS+  Musculoskeletal.  Mild tenderness at left scapular region,  range of motion nonrestricted and normal. Extremities: No lower extremity edema bilaterally,  pulses symmetric and intact bilaterally. No cyanosis or clubbing. Skin: Warm, dry and intact. No rashes or erythema. Psychiatric: Normal mood and affect. speech and behavior is normal. Cognition and memory are normal.  Assessment & Plan:   See Encounters Tab for problem based charting.  Patient discussed with Dr. Daryll Drown.

## 2018-01-28 NOTE — Assessment & Plan Note (Signed)
Continue to get intermittent left scapular pain.  No sign of any radiculopathy, no restricted movement.  Patient was asking for a refill of ibuprofen as directed to help with his pain, because of his epigastric and abdominal symptoms I did not provide him with any more refill of ibuprofen.  He was told to use Tylenol if needed for pain. Ibuprofen can be resumed if he has a clearance from GI.

## 2018-01-28 NOTE — Patient Instructions (Addendum)
Thank you for visiting clinic today. As we discussed please visit stomach Dr. for further management of your not being hungry and complaining of being bloated most of the time. Your blood levels are low-you will need a colonoscopy and a possible endoscopy by them. As we discussed I am increasing the dose of your blood pressure medicine called amlodipine, now you will take it 10 mg daily. As ibuprofen can also cause pain in your stomach, you can take Tylenol for your shoulder pain if needed. Please follow-up in 1 month for your blood pressure check.

## 2018-01-28 NOTE — Assessment & Plan Note (Signed)
BP Readings from Last 3 Encounters:  01/28/18 (!) 138/97  01/22/18 127/85  01/05/18 134/82   His blood pressure was elevated, it was elevated during his ED visit too. According to patient he is compliant with his amlodipine 5 mg daily and he did not took his meds before coming to clinic today.  -Increase amlodipine to 10 mg daily. -Follow-up in 4 weeks to reassess.

## 2018-01-28 NOTE — Assessment & Plan Note (Signed)
His symptoms of early satiety, constipation and abdominal bloatedness along with his microcytic anemia with low ferritin levels are worrisome. He needs an urgent evaluation with GI.  He was provided with a new urgent referral for gastroenterology.

## 2018-01-29 NOTE — Progress Notes (Signed)
Internal Medicine Clinic Attending  Case discussed with Dr. Amin at the time of the visit.  We reviewed the resident's history and exam and pertinent patient test results.  I agree with the assessment, diagnosis, and plan of care documented in the resident's note.    

## 2018-02-01 ENCOUNTER — Ambulatory Visit (HOSPITAL_COMMUNITY)
Admission: RE | Admit: 2018-02-01 | Discharge: 2018-02-01 | Disposition: A | Discharge status: Home / Self Care | Payer: Self-pay | Source: Ambulatory Visit | Attending: Internal Medicine | Admitting: Internal Medicine

## 2018-02-01 ENCOUNTER — Other Ambulatory Visit: Payer: Self-pay

## 2018-02-01 ENCOUNTER — Ambulatory Visit (INDEPENDENT_AMBULATORY_CARE_PROVIDER_SITE_OTHER): Payer: Self-pay | Admitting: Internal Medicine

## 2018-02-01 ENCOUNTER — Encounter: Payer: Self-pay | Admitting: Internal Medicine

## 2018-02-01 ENCOUNTER — Encounter (HOSPITAL_COMMUNITY): Payer: Self-pay

## 2018-02-01 VITALS — BP 122/73 | HR 102 | Temp 98.0°F | Ht 62.0 in | Wt 141.0 lb

## 2018-02-01 DIAGNOSIS — D509 Iron deficiency anemia, unspecified: Secondary | ICD-10-CM

## 2018-02-01 DIAGNOSIS — R1084 Generalized abdominal pain: Secondary | ICD-10-CM | POA: Insufficient documentation

## 2018-02-01 DIAGNOSIS — N281 Cyst of kidney, acquired: Secondary | ICD-10-CM | POA: Insufficient documentation

## 2018-02-01 DIAGNOSIS — N289 Disorder of kidney and ureter, unspecified: Secondary | ICD-10-CM | POA: Insufficient documentation

## 2018-02-01 DIAGNOSIS — R1031 Right lower quadrant pain: Secondary | ICD-10-CM

## 2018-02-01 DIAGNOSIS — R6881 Early satiety: Secondary | ICD-10-CM

## 2018-02-01 DIAGNOSIS — R140 Abdominal distension (gaseous): Secondary | ICD-10-CM

## 2018-02-01 DIAGNOSIS — K5900 Constipation, unspecified: Secondary | ICD-10-CM

## 2018-02-01 DIAGNOSIS — R1032 Left lower quadrant pain: Secondary | ICD-10-CM

## 2018-02-01 IMAGING — CT CT ABD-PELV W/ CM
2 of 5 series · 15 of 46 positions shown, 17 images · IV contrast (Omni 300)
Comparison: Abdominal ultrasound [DATE], body CT [DATE]

CLINICAL DATA: Abdominal pain, nausea which has increased over the
course of the last week.

EXAM:
CT ABDOMEN AND PELVIS WITH CONTRAST
TECHNIQUE: Multidetector CT imaging of the abdomen and pelvis was performed
using the standard protocol following bolus administration of
intravenous contrast.
CONTRAST:  100mL [HM] IOPAMIDOL ([HM]) INJECTION 61%

[Series 3: a/p w/ 5mm · axial · 0.71mm/px · z∈[+778,+1182]mm · 12 of 91 slices shown, 14 images]
[im 5/91  soft-tissue]
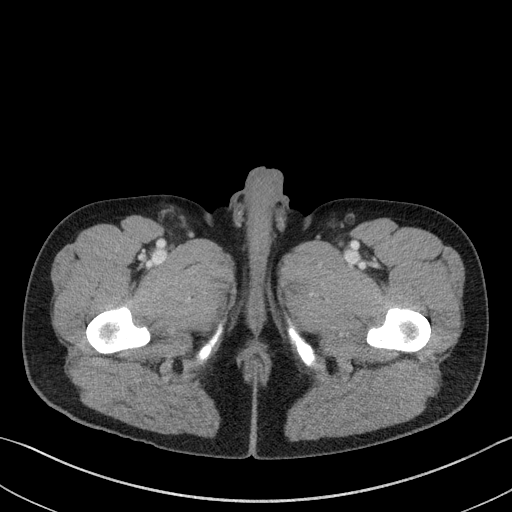
[im 5/91  bone]
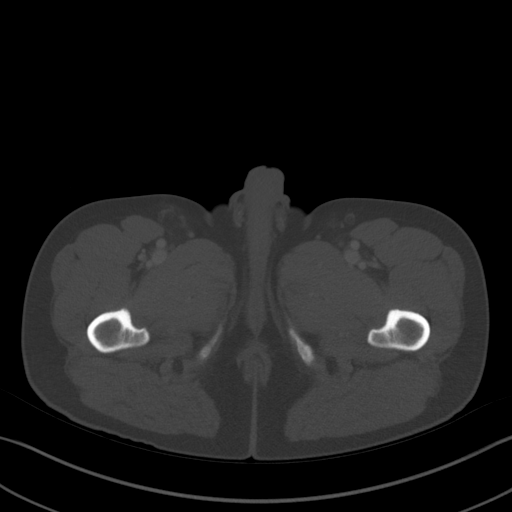
[im 14/91  soft-tissue]
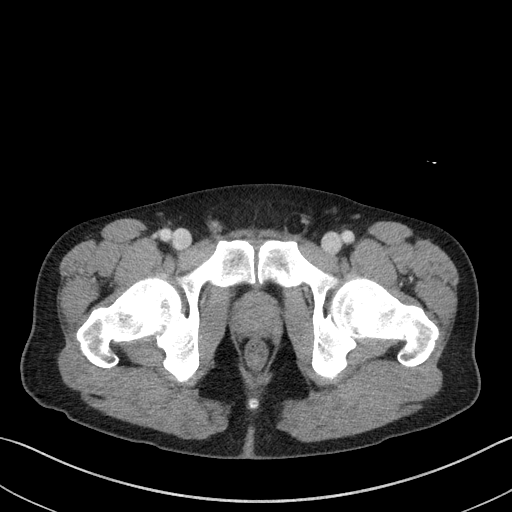
[im 19/91  soft-tissue]
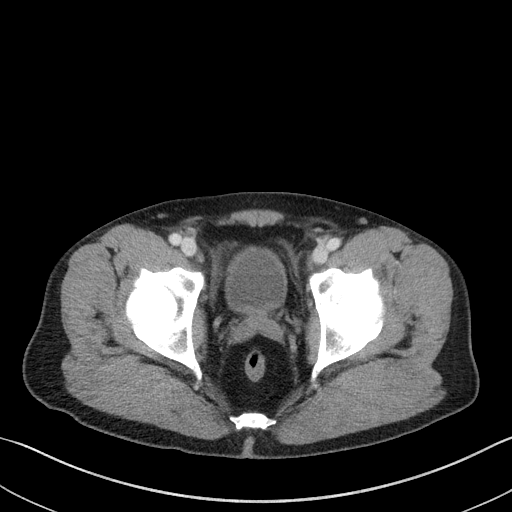
[im 28/91  soft-tissue]
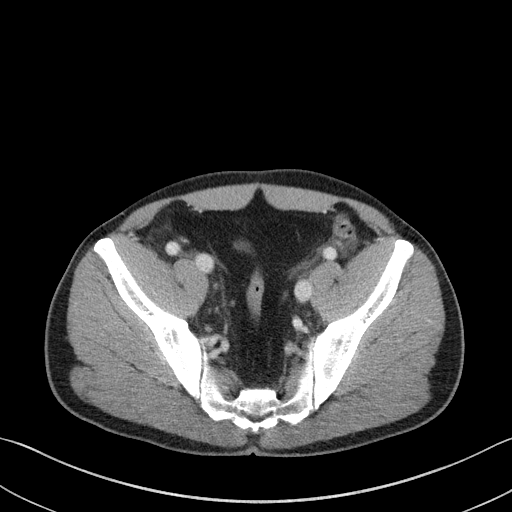
[im 37/91  soft-tissue]
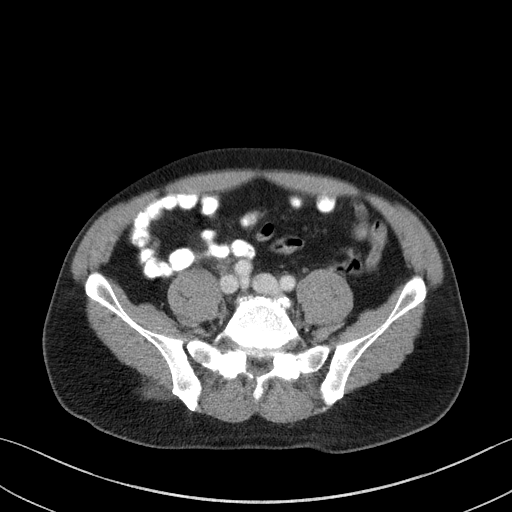
[im 41/91  soft-tissue]
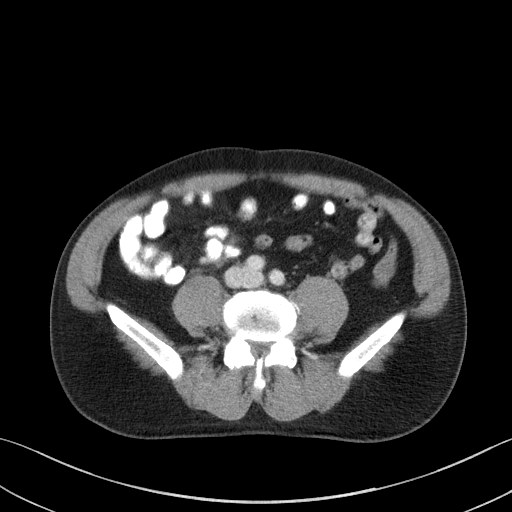
[im 50/91  soft-tissue]
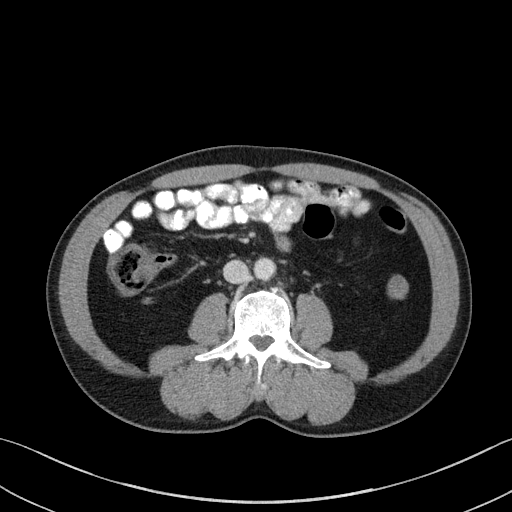
[im 55/91  soft-tissue]
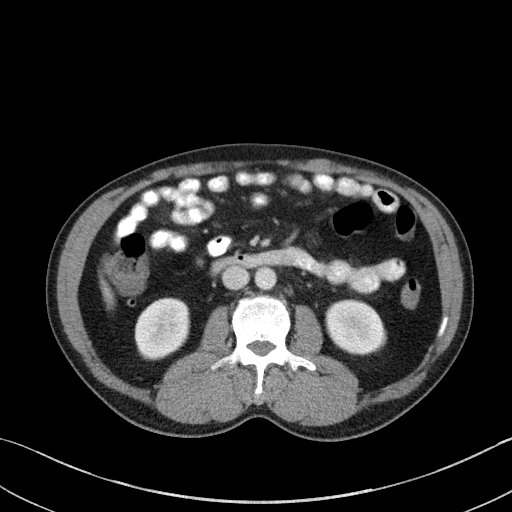
[im 64/91  soft-tissue]
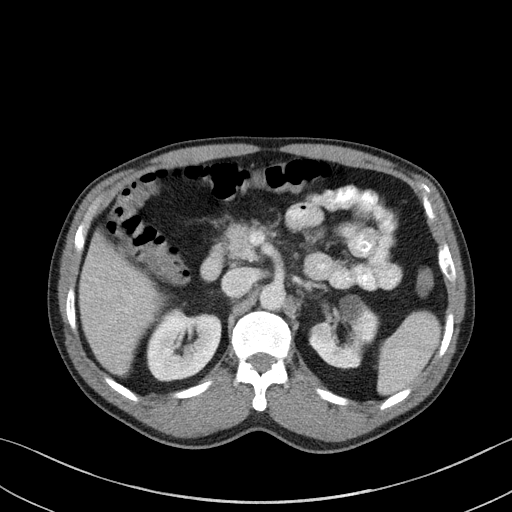
[im 64/91  bone]
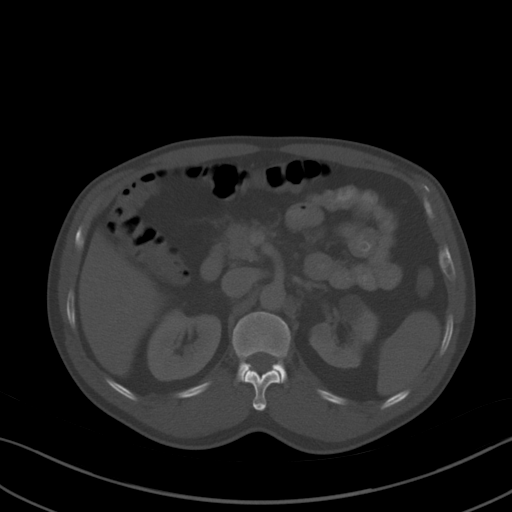
[im 73/91  soft-tissue]
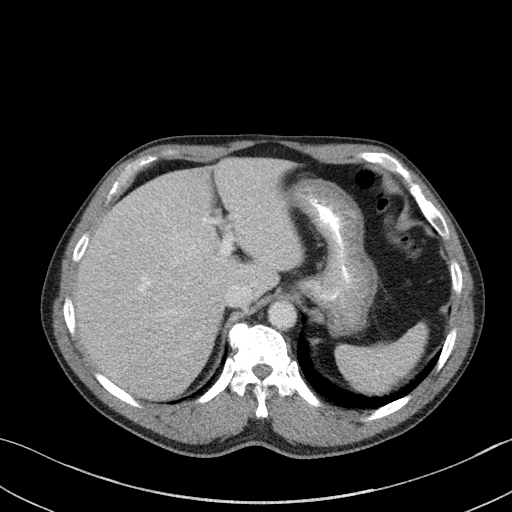
[im 77/91  soft-tissue]
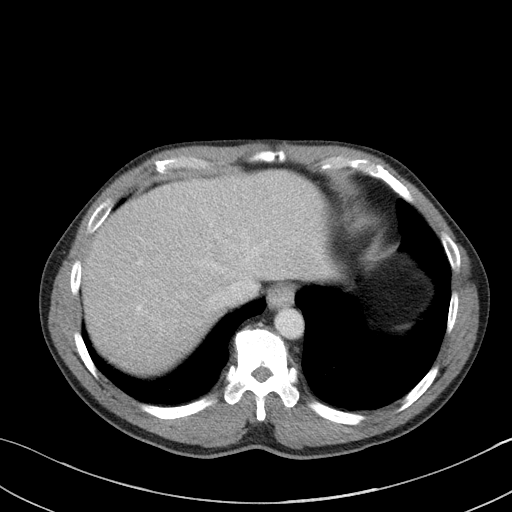
[im 86/91  soft-tissue]
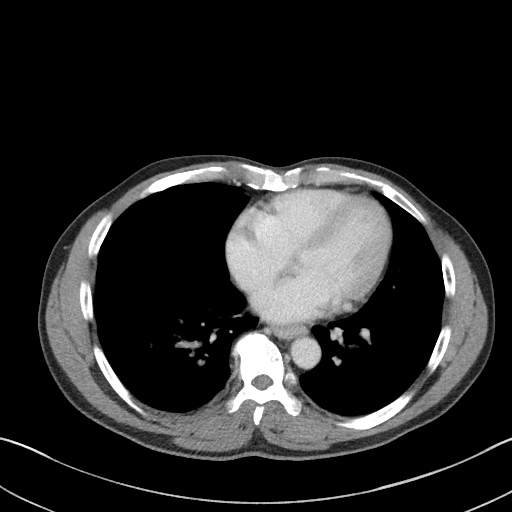

[Series 6: a/p w/ cor · coronal · 0.68mm/px · 3 of 125 slices shown]
[im 42/125  soft-tissue]
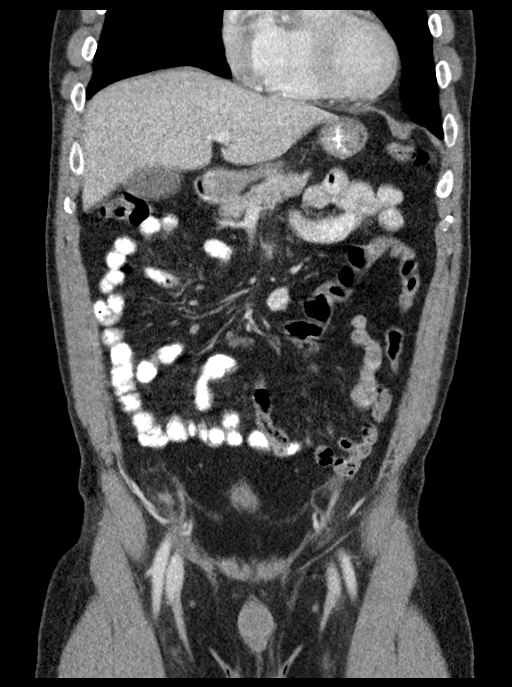
[im 56/125  soft-tissue]
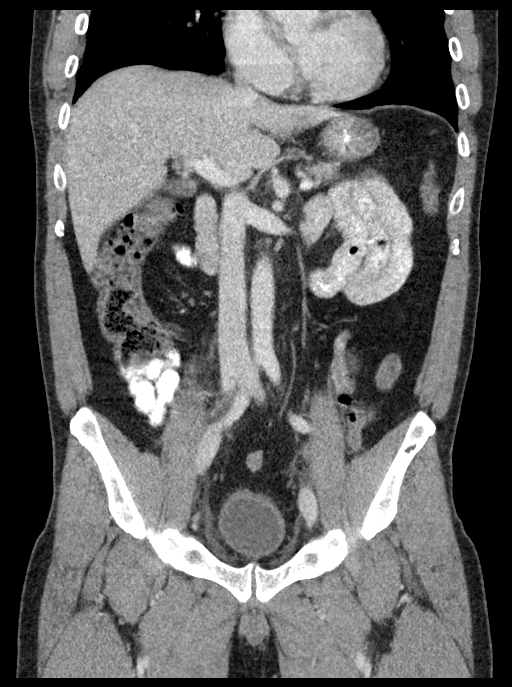
[im 69/125  soft-tissue]
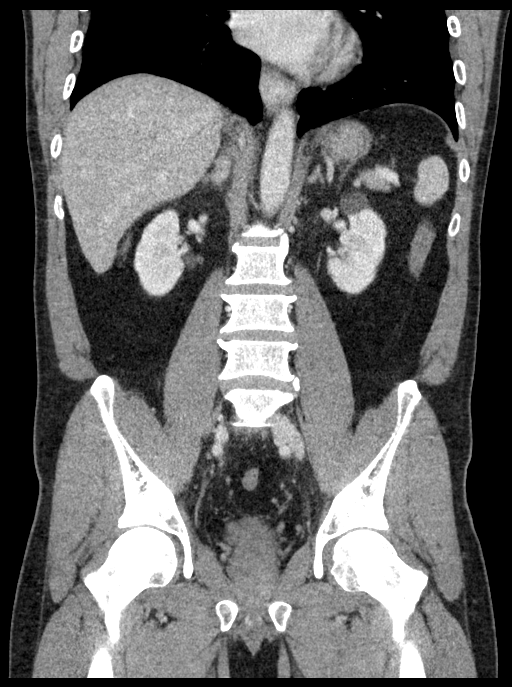

[15 of 46 positions shown; findings below may reference images not displayed]

FINDINGS: Lower chest: No acute abnormality.

Hepatobiliary: No focal liver abnormality is seen. No gallstones,
gallbladder wall thickening, or biliary dilatation.

Pancreas: Unremarkable. No pancreatic ductal dilatation or
surrounding inflammatory changes.

Spleen: Normal in size without focal abnormality.

Adrenals/Urinary Tract: Normal adrenal glands. Exophytic cyst off of
the lower pole of the right kidney, and upper pole of the left
kidney. Two on the right and 1 on the left other too small to be
actually characterize hypoattenuated nodules. Cortical scarring of
the upper pole of the left kidney. No nephrolithiasis or
hydronephrosis. Mild thickening of the urinary bladder wall.

Stomach/Bowel: Stomach is within normal limits. Appendix appears
normal. No evidence of bowel wall thickening, distention, or
inflammatory changes.

Vascular/Lymphatic: No significant vascular findings are present. No
enlarged abdominal or pelvic lymph nodes. Numerous sub pathologic by
CT criteria mesenteric and retroperitoneal lymph nodes. Nonspecific
fat stranding along the bilateral iliac chains.

Reproductive: Prostate is unremarkable.

Other: Fat containing left inguinal hernia. No abdominopelvic
ascites.

Musculoskeletal: No acute osseous findings. Mild posterior facet
arthropathy of the lower lumbosacral spine.
IMPRESSION: Numerous sub pathologic by CT criteria mesenteric and
retroperitoneal lymph nodes, with nonspecific fat stranding along
the bilateral iliac chains. Clinical correlation is recommended.
Follow-up with abdominal CT in 3-6 months may be considered to
assure resolution of these nonspecific findings, if no clinical
reason could be established.

Mild thickening of the urinary bladder wall. Please correlate to
urinalysis.

Left upper pole renal scarring.

Bilateral renal cysts with additional too small to be actually
characterized hypoattenuated lesions in bilateral kidneys.

## 2018-02-01 MED ORDER — IOPAMIDOL (ISOVUE-300) INJECTION 61%
INTRAVENOUS | Status: AC
  Administered 2018-02-01: 100 mL
  Filled 2018-02-01: qty 100

## 2018-02-01 NOTE — Assessment & Plan Note (Signed)
The patient is a 57 year old male presenting to the clinic with continued abdominal pain, bloating, and early satiety. He does have a G.I. appointment scheduled on 2/28. Red flag symptoms include iron deficieny anemia. Although he does not have rash, stools consistent with fat malabsorption, weight loss, hematochezia, or hematemesis further investigation is warranted. At this point I do feel that a CT abdomen and pelvis with would be of benefit to evaluate for signs of colitis or bowel obstruction. If CT abdomen and pelvis is unrevealing we will proceed with stool studies.   Discussed the plan with the patient and he agrees. Discussed that he feels his abdominal pain is getting worse where he begins to experience red flag symptoms to seek further medical attention immediately. He will stop taking the Imodium and bentyl at this point.  Plan: - CT abdomen/[elvis to evaluate for colitis/bowel obstruction  - Urgent GI referral  - Will call patient with results.

## 2018-02-01 NOTE — Patient Instructions (Signed)
Thank you for allowing [place] to provide your care. Today we will be getting a CT abdomen to evaluate for other causes of your abdominal pain. I will call you with your results. Please follow-up with GI on 2/28 and I will call you if we need to do anything else before that time.

## 2018-02-01 NOTE — Progress Notes (Signed)
   CC: Abdominal pain  HPI:  [name] is a 57 y.o. presented to the acute care clinic with complaints of abdominal pain.  Patient was evaluated in the emergency department on 2/9 with three days of generalized abdominal pain, nausea/vomiting, and diarrhea. Labs at the time including LFTs and lipase were unremarkable. That point it was felt that the patient was likely suffering from viral gastroenteritis and discharged home.  Present evaluated in the clinic on 2/15 with complaints of continued abdominal pain, bloating, and early satiety. Given the patient's iron deficiency anemia, he was urgently referral to gastroenterology for further evaluation.  Today he continues to have constipation, abdominal pain, bloating, and early satiety. He states that he is having difficulty tolerating PO intake. He is no longer taking Imodium or bentyl. No one in his household has similar symptoms. He endorses chills, myalgias, tenesmus. He denies hematochezia, hematemesis, rash, foul-smelling stools, arthralgias. Does have a history of similar symptoms that have occurred on and off for the past two years. In between these episodes he does have normal bowel movements with no abdominal pain. He has never been screened for diabetes and does not know if he had as a history of polio. He is unsure about family history, his mother is still a [place] but he does know that she has some sort of bowel issues.  Review of patient's medical chart indicates that he is not lost weight over the past 12 months.   Past Medical History:  Diagnosis Date  . Hypertension    Review of Systems:   12 point ROS preformed. All negative aside from those mentioned in the HPI.  Physical Exam: There were no vitals filed for this visit. General: Well nourished male in no acute distress HENT: Normocephalic, atraumatic, moist mucus membranes  Pulm: Good air movement with no wheezing or crackles  CV: RRR, no murmurs, no rubs  Abdomen:  Active bowel sounds, soft, distended, no fluid wave, some tenderness to palpation of the R and LLQ. No hepatosplenomegaly.  Extremities: Pulses palpable in the upper extremities bilaterally, no LE edema  Skin: Warm and dry  Neuro: Alert and oriented  Assessment & Plan:   See Encounters Tab for problem based charting.  Patient discussed with Dr. Lynnae January

## 2018-02-01 NOTE — Progress Notes (Signed)
Internal Medicine Clinic Attending  Case discussed with Dr. Helberg at the time of the visit.  We reviewed the resident's history and exam and pertinent patient test results.  I agree with the assessment, diagnosis, and plan of care documented in the resident's note.    

## 2018-02-02 ENCOUNTER — Telehealth: Payer: Self-pay

## 2018-02-02 NOTE — Telephone Encounter (Signed)
Phone call from [place] [facility]. She stated patient was referred by [facility] due to anemia.  Appointment scheduled for February 15, 2018 at 8:45 am.  She stated patient must bring his own interpreter and that I need to call her back to tell her who will interpret for him.  CN called interpreter Diu Hartshorn who will contact patient.Jake Michaelis RN, Congregational Nurse [phone]

## 2018-02-04 ENCOUNTER — Other Ambulatory Visit: Payer: Self-pay

## 2018-02-04 ENCOUNTER — Emergency Department (HOSPITAL_COMMUNITY)
Admission: EM | Admit: 2018-02-04 | Discharge: 2018-02-04 | Disposition: A | Discharge status: Home / Self Care | Payer: Self-pay | Attending: Emergency Medicine | Admitting: Emergency Medicine

## 2018-02-04 ENCOUNTER — Emergency Department (HOSPITAL_COMMUNITY): Payer: Self-pay

## 2018-02-04 ENCOUNTER — Encounter (HOSPITAL_COMMUNITY): Payer: Self-pay

## 2018-02-04 DIAGNOSIS — Z79899 Other long term (current) drug therapy: Secondary | ICD-10-CM | POA: Insufficient documentation

## 2018-02-04 DIAGNOSIS — R109 Unspecified abdominal pain: Secondary | ICD-10-CM | POA: Insufficient documentation

## 2018-02-04 DIAGNOSIS — I10 Essential (primary) hypertension: Secondary | ICD-10-CM | POA: Insufficient documentation

## 2018-02-04 LAB — CBC
HEMATOCRIT: 35.7 % — AB (ref 39.0–52.0)
HEMOGLOBIN: 12.3 g/dL — AB (ref 13.0–17.0)
MCH: 22.7 pg — AB (ref 26.0–34.0)
MCHC: 34.5 g/dL (ref 30.0–36.0)
MCV: 65.7 fL — ABNORMAL LOW (ref 78.0–100.0)
Platelets: 459 10*3/uL — ABNORMAL HIGH (ref 150–400)
RBC: 5.43 MIL/uL (ref 4.22–5.81)
RDW: 14.6 % (ref 11.5–15.5)
WBC: 14.3 10*3/uL — ABNORMAL HIGH (ref 4.0–10.5)

## 2018-02-04 LAB — BASIC METABOLIC PANEL
ANION GAP: 13 (ref 5–15)
BUN: 15 mg/dL (ref 6–20)
CALCIUM: 9.2 mg/dL (ref 8.9–10.3)
CO2: 21 mmol/L — ABNORMAL LOW (ref 22–32)
Chloride: 101 mmol/L (ref 101–111)
Creatinine, Ser: 1.34 mg/dL — ABNORMAL HIGH (ref 0.61–1.24)
GFR calc Af Amer: >60 mL/min (ref >60)
GFR, EST NON AFRICAN AMERICAN: 58 mL/min — AB (ref >60)
GLUCOSE: 133 mg/dL — AB (ref 65–99)
Potassium: 3.3 mmol/L — ABNORMAL LOW (ref 3.5–5.1)
Sodium: 135 mmol/L (ref 135–145)

## 2018-02-04 LAB — I-STAT TROPONIN, ED: TROPONIN I, POC: 0 ng/mL (ref 0.00–0.08)

## 2018-02-04 IMAGING — DX DG CHEST 2V
2 series · 2 of 2 positions shown · non-contrast
Comparison: [DATE]

CLINICAL DATA: Chest pain and vomiting

EXAM:
CHEST  2 VIEW

[chest pa]
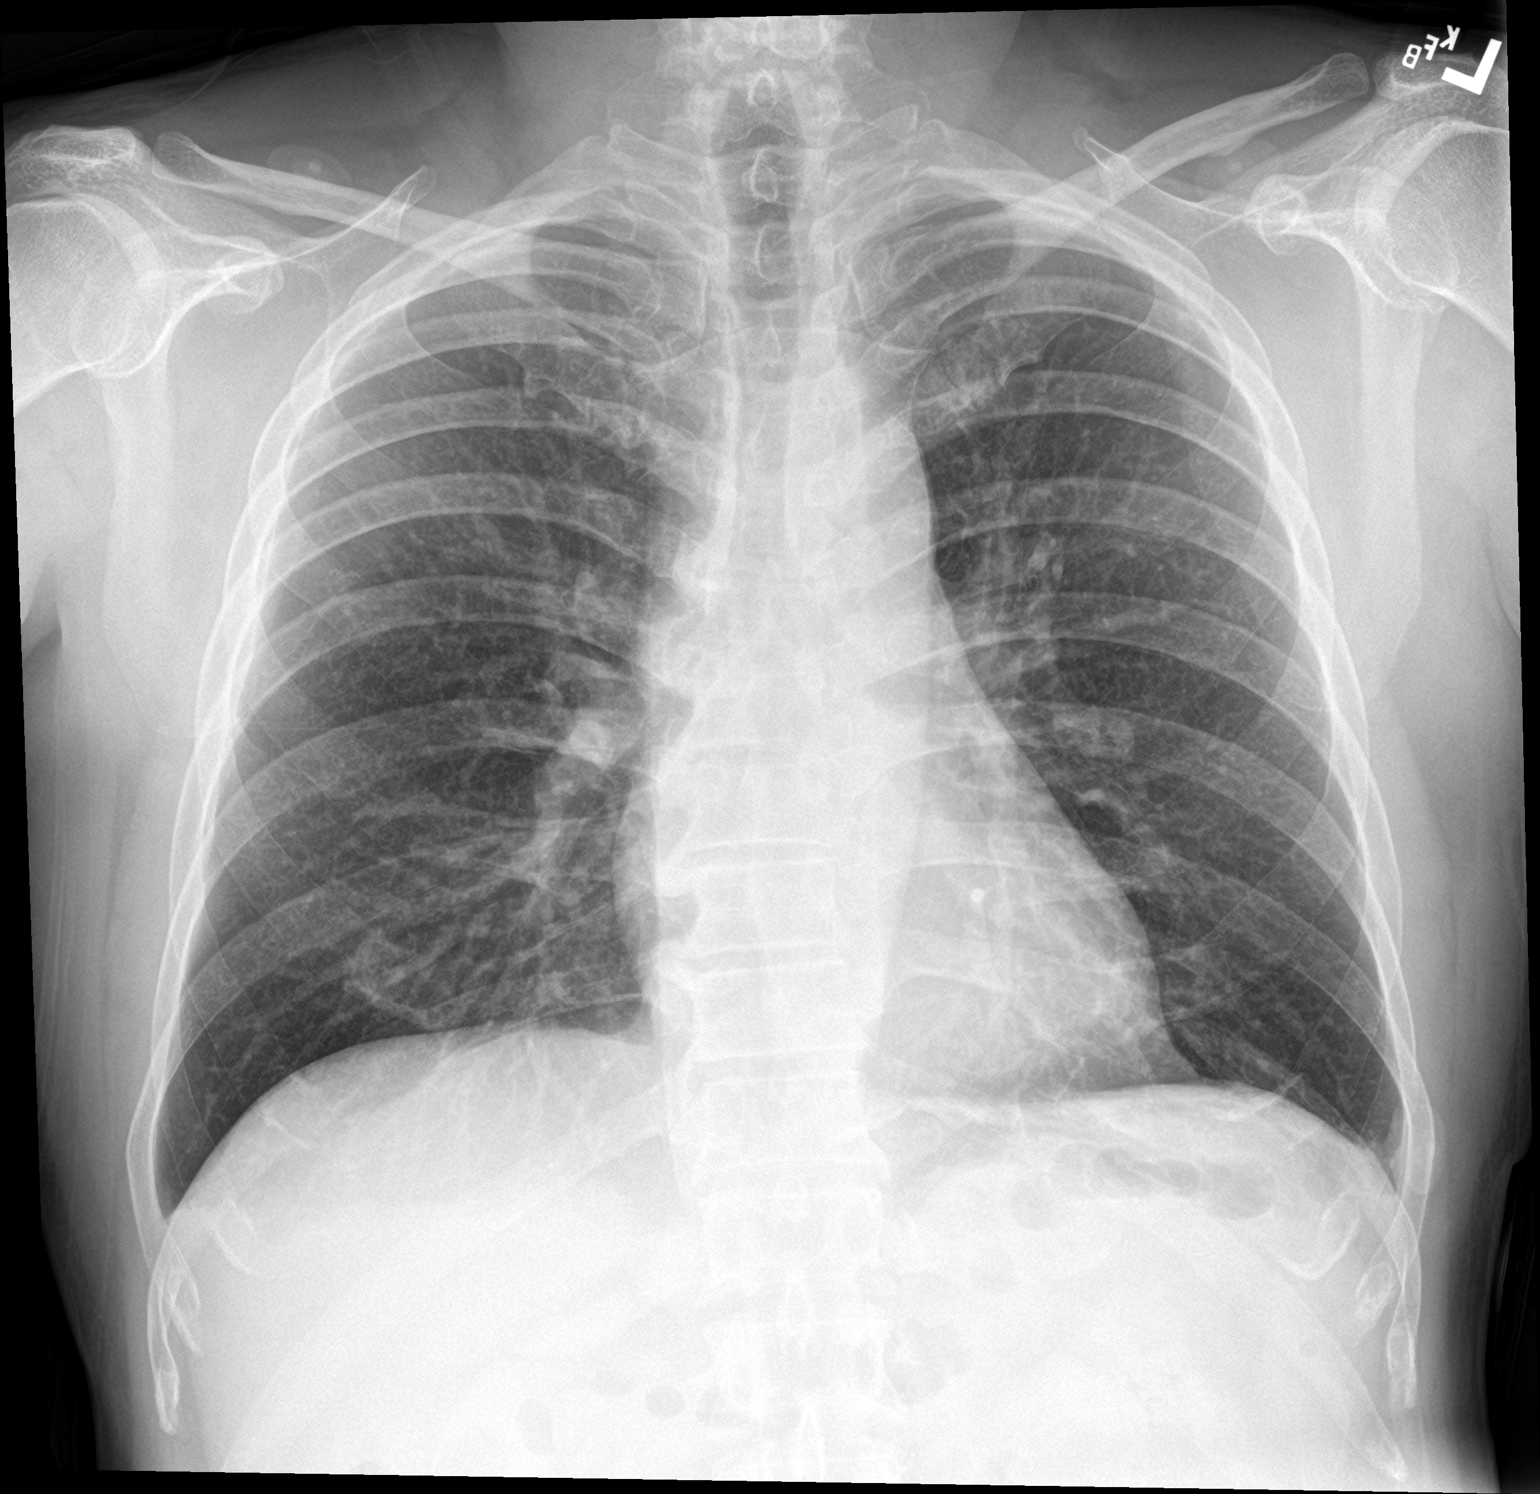

[chest lat]
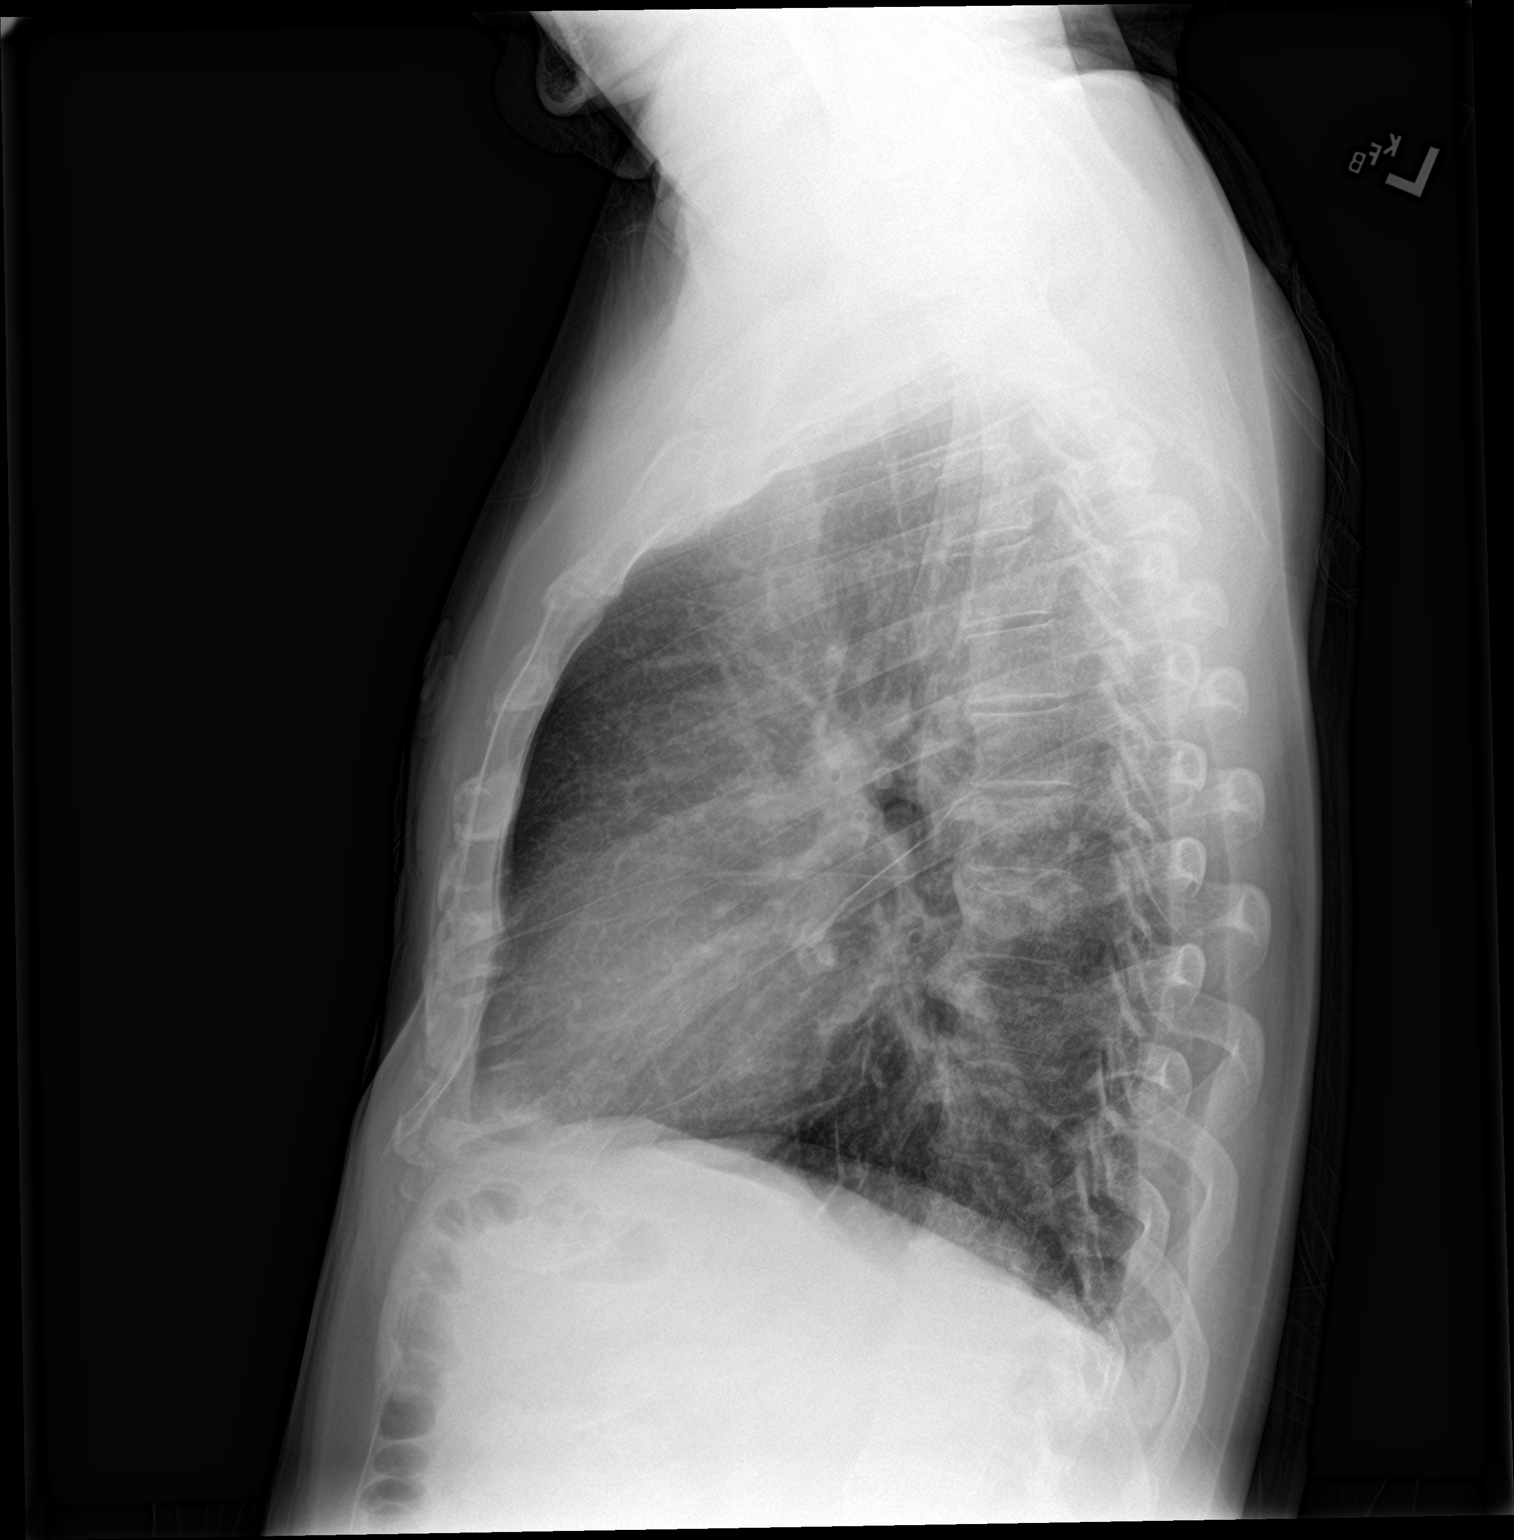

[2 of 2 positions shown; findings below may reference images not displayed]

FINDINGS: Lungs are clear. Heart size and pulmonary vascularity are normal. No
adenopathy. There is degenerative change in the lower thoracic
spine. No pneumothorax.
IMPRESSION: No edema or consolidation.

## 2018-02-04 NOTE — ED Notes (Signed)
Pt and family given update on care.

## 2018-02-04 NOTE — ED Provider Notes (Signed)
[facility] DEPARTMENT Provider Note   CSN: [id number] Arrival date & time: 02/04/18  1303     History   Chief Complaint Chief Complaint  Patient presents with  . multiple complaints    HPI Eric Ingram is a 57 y.o. male.  HPI  57 year old male history of hypertension presents the emergency department with abdominal pain proximally 3 weeks had been evaluated most recently 2 days ago with a CT abdomen pelvis with no acute findings.  Patient has persistent intermittent crampy abdominal pain along with nausea vomiting diarrhea.  Today the patient states having no bowel movement in the past 4 days with lower extremity edema, sore throat mild chest discomfort.  Patient is pending evaluation from gastroenterology on 28 February.    Past Medical History:  Diagnosis Date  . Hypertension     Patient Active Problem List   Diagnosis Date Noted  . Generalized abdominal pain 02/01/2018  . Health care maintenance 01/05/2018  . Microcytic anemia 10/16/2017  . Pain of left scapula 10/16/2017  . Hypertension 10/16/2017    Past Surgical History:  Procedure Laterality Date  . NO PAST SURGERIES         Home Medications    Prior to Admission medications   Medication Sig Start Date End Date Taking? Authorizing Provider  amLODipine (NORVASC) 10 MG tablet Take 1 tablet (10 mg total) by mouth daily. 01/28/18 02/27/18  Lorella Nimrod, MD  fluticasone (FLONASE) 50 MCG/ACT nasal spray Place 1 spray into both nostrils daily. 01/05/18 01/05/19  Lorella Nimrod, MD  ibuprofen (ADVIL,MOTRIN) 600 MG tablet Take 1 tablet (600 mg total) by mouth every 6 (six) hours as needed. 10/14/17   Maryellen Pile, MD  ondansetron (ZOFRAN ODT) 4 MG disintegrating tablet Take 1 tablet (4 mg total) by mouth every 8 (eight) hours as needed for nausea or vomiting. 01/22/18   Long, Wonda Olds, MD  sodium chloride (OCEAN) 0.65 % SOLN nasal spray Place 1 spray into both nostrils as needed for  congestion. 01/05/18   Lorella Nimrod, MD    Family History Family History  Family history unknown: Yes    Social History Social History   Tobacco Use  . Smoking status: Never Smoker  . Smokeless tobacco: Never Used  Substance Use Topics  . Alcohol use: No  . Drug use: No     Allergies   Patient has no known allergies.   Review of Systems Review of Systems  Review of Systems  Constitutional: Negative for fever and chills.  HENT: Negative for ear pain; + ST; neg difficulty swallowing.  Eyes: Negative for pain and visual disturbance.  Respiratory: Negative for cough and shortness of breath.   Cardiovascular: Negative for chest pain and leg swelling.  Gastrointestinal: see HPI Genitourinary: Negative for dysuria, urgency and frequency.  Musculoskeletal: Negative for back pain ; see HPI Skin: Negative for rash and wound.  Neurological: Negative for dizziness, syncope, speech difficulty, weakness and numbness.   Physical Exam Updated Vital Signs BP 135/72   Pulse (!) 107   Temp 98.2 F (36.8 C)   Resp 18   Wt 64 kg (141 lb)   SpO2 99%   BMI 25.79 kg/m   Physical Exam  Physical Exam Vitals:   02/04/18 1311 02/04/18 1605  BP: 121/69 135/72  Pulse: (!) 108 (!) 107  Resp: 18 18  Temp: 98.2 F (36.8 C)   SpO2: 100% 99%   Constitutional: Patient is in no acute distress Head: Normocephalic and atraumatic.  Eyes: Extraocular motion intact, no scleral icterus Neck: Supple without meningismus, mass, or overt JVD Respiratory: Effort normal and breath sounds normal. No respiratory distress. CV: Heart regular rate and rhythm, no obvious murmurs.  Pulses +2 and symmetric Abdomen: Soft, non-tender, non-distended; neg peritoneal signs MSK: Extremities are atraumatic without deformity, ROM intact; neg LE swelling Skin: Warm, dry, intact Neuro: Alert and oriented, no motor deficit noted Psychiatric: Mood and affect are normal.  ED Treatments / Results  Labs (all  labs ordered are listed, but only abnormal results are displayed) Labs Reviewed  BASIC METABOLIC PANEL - Abnormal; Notable for the following components:      Result Value   Potassium 3.3 (*)    CO2 21 (*)    Glucose, Bld 133 (*)    Creatinine, Ser 1.34 (*)    GFR calc non Af Amer 58 (*)    All other components within normal limits  CBC - Abnormal; Notable for the following components:   WBC 14.3 (*)    Hemoglobin 12.3 (*)    HCT 35.7 (*)    MCV 65.7 (*)    MCH 22.7 (*)    Platelets 459 (*)    All other components within normal limits  I-STAT TROPONIN, ED    EKG  EKG Interpretation  Date/Time:  Friday February 04 2018 13:13:00 EST Ventricular Rate:  99 PR Interval:  118 QRS Duration: 88 QT Interval:  356 QTC Calculation: 456 R Axis:   80 Text Interpretation:  Normal sinus rhythm Possible Left atrial enlargement T wave abnormality, consider inferior ischemia Abnormal ECG No significant change since last tracing Confirmed by Orlie Dakin [phone]) on 02/04/2018 4:04:02 PM       Radiology Dg Chest 2 View  Result Date: 02/04/2018 CLINICAL DATA:  Chest pain and vomiting EXAM: CHEST  2 VIEW COMPARISON:  January 22, 2018 FINDINGS: Lungs are clear. Heart size and pulmonary vascularity are normal. No adenopathy. There is degenerative change in the lower thoracic spine. No pneumothorax. IMPRESSION: No edema or consolidation. Electronically Signed   By: Lowella Grip III M.D.   On: 02/04/2018 13:38    Procedures Procedures (including critical care time)  Medications Ordered in ED Medications - No data to display   Initial Impression / Assessment and Plan / ED Course  I have reviewed the triage vital signs and the nursing notes.  Pertinent labs & imaging results that were available during my care of the patient were reviewed by me and considered in my medical decision making (see chart for details).     57 year old male history of hypertension presents the emergency  department with abdominal pain proximally 3 weeks had been evaluated most recently 2 days ago with a CT abdomen pelvis with no acute findings.  Patient has persistent intermittent crampy abdominal pain along with nausea vomiting diarrhea.  Today the patient states having no bowel movement in the past 4 days with lower extremity edema, sore throat mild chest discomfort.  Patient is pending evaluation from gastroenterology on 28 February.   Patient arrived minimally tachycardic but otherwise well-appearing.  Physical exam as annotated above was unremarkable for any acute abdomen and there was no appreciated lower extremity swelling/ankle injury.  Patient had a bowel movement while in the emergency department had interval improvement with discomfort.  Patient labs and physical exam is reassuring and patient has close follow-up with gastroenterology within the next 6 days.  Patient likely had change in motility from recent diarrheal illness that is resolved the  patient.  Do not suspect acute abdomen or additional imaging indicated.  Plan for discharge home follow precautions to primary care provider as needed.  Of note review of EKG and trop x 1 neg, pt low risk suspicion for ACS.    Final Clinical Impressions(s) / ED Diagnoses   Final diagnoses:  Abdominal discomfort    ED Discharge Orders    None       Willette Alma, DO 02/04/18 2011    Orlie Dakin, MD 02/04/18 2342

## 2018-02-04 NOTE — ED Provider Notes (Signed)
History is obtained from family member acting as interpreter.  Patient speaks no [place].  There is no professional interpreter available.  Patient complained of constipation and vomited once earlier today.  And vague abdominal discomfort.  He is presently asymptomatic since he had a bowel movement in the emergency department.  And hungry.  Exam alert no distress HEENT exam no facial asymmetry neck supple trachea midline no lymphadenopathy lungs clear to auscultation abdomen nondistended normal active bowel sounds soft nontender genitalia normal male extremities without edema.  Gait normal.   Eric Dakin, MD 02/04/18 2032

## 2018-02-04 NOTE — ED Triage Notes (Signed)
Pt presents to the ed with multiple complaints, headache, chest pain, abdominal pain, vomiting, insomnia, and ankle swelling x 2 days.

## 2018-02-08 ENCOUNTER — Observation Stay (HOSPITAL_COMMUNITY)
Admission: EM | Admit: 2018-02-08 | Discharge: 2018-02-09 | Disposition: A | Discharge status: Home / Self Care | Payer: Self-pay | Attending: Internal Medicine | Admitting: Internal Medicine

## 2018-02-08 ENCOUNTER — Encounter (HOSPITAL_COMMUNITY): Payer: Self-pay

## 2018-02-08 ENCOUNTER — Emergency Department (HOSPITAL_COMMUNITY): Payer: Self-pay

## 2018-02-08 ENCOUNTER — Other Ambulatory Visit: Payer: Self-pay

## 2018-02-08 ENCOUNTER — Ambulatory Visit (HOSPITAL_COMMUNITY)
Admission: RE | Admit: 2018-02-08 | Discharge: 2018-02-08 | Disposition: A | Discharge status: Home / Self Care | Payer: Self-pay | Source: Ambulatory Visit | Attending: Family Medicine | Admitting: Family Medicine

## 2018-02-08 ENCOUNTER — Ambulatory Visit (INDEPENDENT_AMBULATORY_CARE_PROVIDER_SITE_OTHER): Payer: Self-pay | Admitting: Internal Medicine

## 2018-02-08 VITALS — BP 127/71 | HR 106 | Temp 97.5°F | Ht 62.0 in | Wt 139.3 lb

## 2018-02-08 DIAGNOSIS — D509 Iron deficiency anemia, unspecified: Secondary | ICD-10-CM | POA: Insufficient documentation

## 2018-02-08 DIAGNOSIS — R9431 Abnormal electrocardiogram [ECG] [EKG]: Secondary | ICD-10-CM | POA: Insufficient documentation

## 2018-02-08 DIAGNOSIS — R0789 Other chest pain: Principal | ICD-10-CM | POA: Insufficient documentation

## 2018-02-08 DIAGNOSIS — R042 Hemoptysis: Secondary | ICD-10-CM | POA: Insufficient documentation

## 2018-02-08 DIAGNOSIS — I5030 Unspecified diastolic (congestive) heart failure: Secondary | ICD-10-CM | POA: Insufficient documentation

## 2018-02-08 DIAGNOSIS — I10 Essential (primary) hypertension: Secondary | ICD-10-CM

## 2018-02-08 DIAGNOSIS — D500 Iron deficiency anemia secondary to blood loss (chronic): Secondary | ICD-10-CM

## 2018-02-08 DIAGNOSIS — R079 Chest pain, unspecified: Secondary | ICD-10-CM | POA: Diagnosis present

## 2018-02-08 DIAGNOSIS — R000 Tachycardia, unspecified: Secondary | ICD-10-CM

## 2018-02-08 DIAGNOSIS — D560 Alpha thalassemia: Secondary | ICD-10-CM

## 2018-02-08 DIAGNOSIS — I5033 Acute on chronic diastolic (congestive) heart failure: Secondary | ICD-10-CM

## 2018-02-08 DIAGNOSIS — K219 Gastro-esophageal reflux disease without esophagitis: Secondary | ICD-10-CM | POA: Insufficient documentation

## 2018-02-08 DIAGNOSIS — I110 Hypertensive heart disease with heart failure: Secondary | ICD-10-CM | POA: Insufficient documentation

## 2018-02-08 DIAGNOSIS — I878 Other specified disorders of veins: Secondary | ICD-10-CM

## 2018-02-08 DIAGNOSIS — R0989 Other specified symptoms and signs involving the circulatory and respiratory systems: Secondary | ICD-10-CM

## 2018-02-08 DIAGNOSIS — Z79899 Other long term (current) drug therapy: Secondary | ICD-10-CM | POA: Insufficient documentation

## 2018-02-08 LAB — BASIC METABOLIC PANEL
ANION GAP: 14 (ref 5–15)
Anion gap: 14 (ref 5–15)
BUN: 16 mg/dL (ref 6–20)
BUN: 16 mg/dL (ref 6–20)
CO2: 19 mmol/L — ABNORMAL LOW (ref 22–32)
CO2: 18 mmol/L — AB (ref 22–32)
CREATININE: 1.24 mg/dL (ref 0.61–1.24)
Calcium: 8.9 mg/dL (ref 8.9–10.3)
Calcium: 8.6 mg/dL — ABNORMAL LOW (ref 8.9–10.3)
Chloride: 101 mmol/L (ref 101–111)
Chloride: 101 mmol/L (ref 101–111)
Creatinine, Ser: 1.25 mg/dL — ABNORMAL HIGH (ref 0.61–1.24)
GFR calc Af Amer: >60 mL/min (ref >60)
GFR calc non Af Amer: >60 mL/min (ref >60)
GLUCOSE: 111 mg/dL — AB (ref 65–99)
Glucose, Bld: 110 mg/dL — ABNORMAL HIGH (ref 65–99)
POTASSIUM: 3 mmol/L — AB (ref 3.5–5.1)
Potassium: 3 mmol/L — ABNORMAL LOW (ref 3.5–5.1)
SODIUM: 134 mmol/L — AB (ref 135–145)
Sodium: 133 mmol/L — ABNORMAL LOW (ref 135–145)

## 2018-02-08 LAB — I-STAT TROPONIN, ED
TROPONIN I, POC: 0 ng/mL (ref 0.00–0.08)
Troponin i, poc: 0 ng/mL (ref 0.00–0.08)

## 2018-02-08 LAB — CBC
HEMATOCRIT: 33.6 % — AB (ref 39.0–52.0)
Hemoglobin: 11.7 g/dL — ABNORMAL LOW (ref 13.0–17.0)
MCH: 22.8 pg — ABNORMAL LOW (ref 26.0–34.0)
MCHC: 34.8 g/dL (ref 30.0–36.0)
MCV: 65.4 fL — AB (ref 78.0–100.0)
PLATELETS: 391 10*3/uL (ref 150–400)
RBC: 5.14 MIL/uL (ref 4.22–5.81)
RDW: 15 % (ref 11.5–15.5)
WBC: 12.1 10*3/uL — AB (ref 4.0–10.5)

## 2018-02-08 LAB — TROPONIN I
Troponin I: <0.03 ng/mL (ref <0.03)
Troponin I: <0.03 ng/mL (ref <0.03)

## 2018-02-08 LAB — BRAIN NATRIURETIC PEPTIDE
B Natriuretic Peptide: 210.9 pg/mL — ABNORMAL HIGH (ref 0.0–100.0)
B Natriuretic Peptide: 204.8 pg/mL — ABNORMAL HIGH (ref 0.0–100.0)

## 2018-02-08 LAB — D-DIMER, QUANTITATIVE (NOT AT ARMC): D DIMER QUANT: 1.06 ug{FEU}/mL — AB (ref 0.00–0.50)

## 2018-02-08 IMAGING — DX DG CHEST 2V
2 series · 2 of 2 positions shown · non-contrast
Comparison: [DATE].

CLINICAL DATA: Chest pain.  Shortness of breath.

EXAM:
CHEST  2 VIEW

[chest lat]
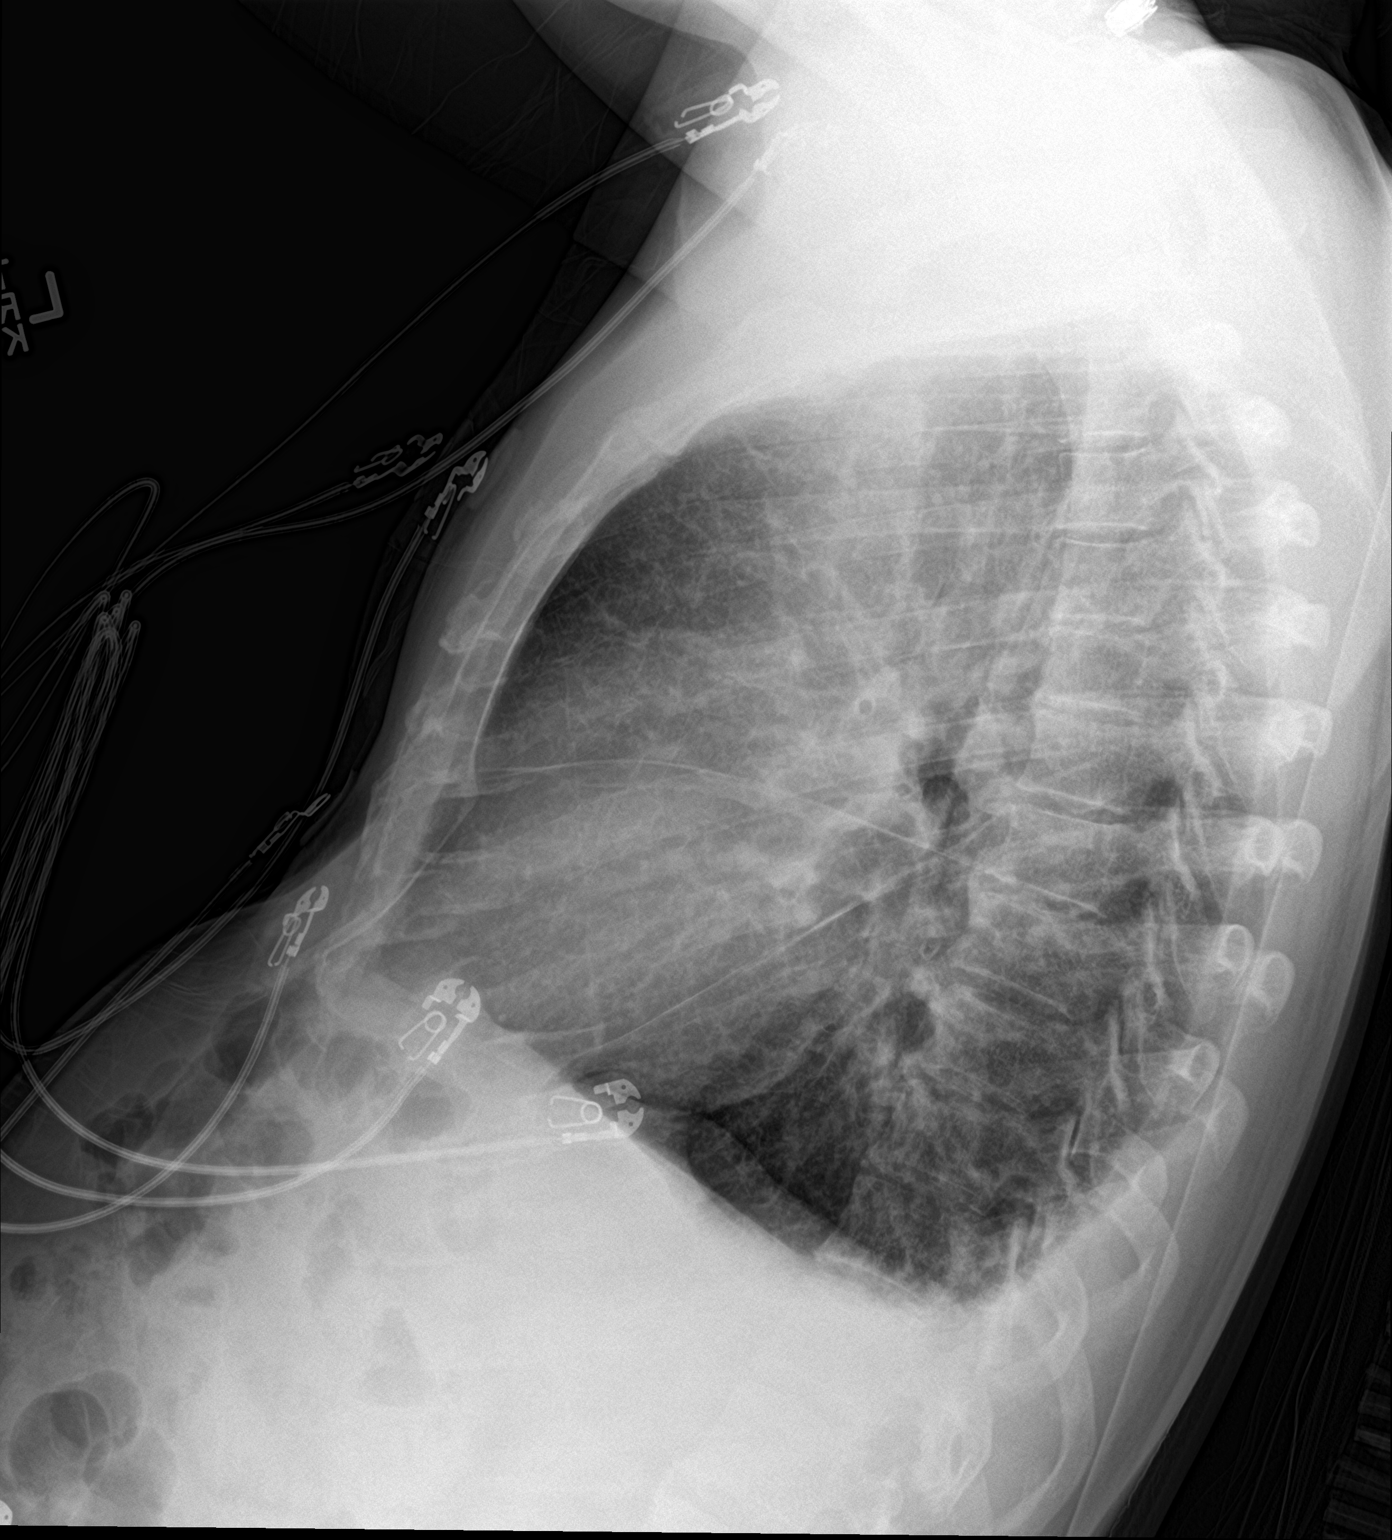

[chest ap]
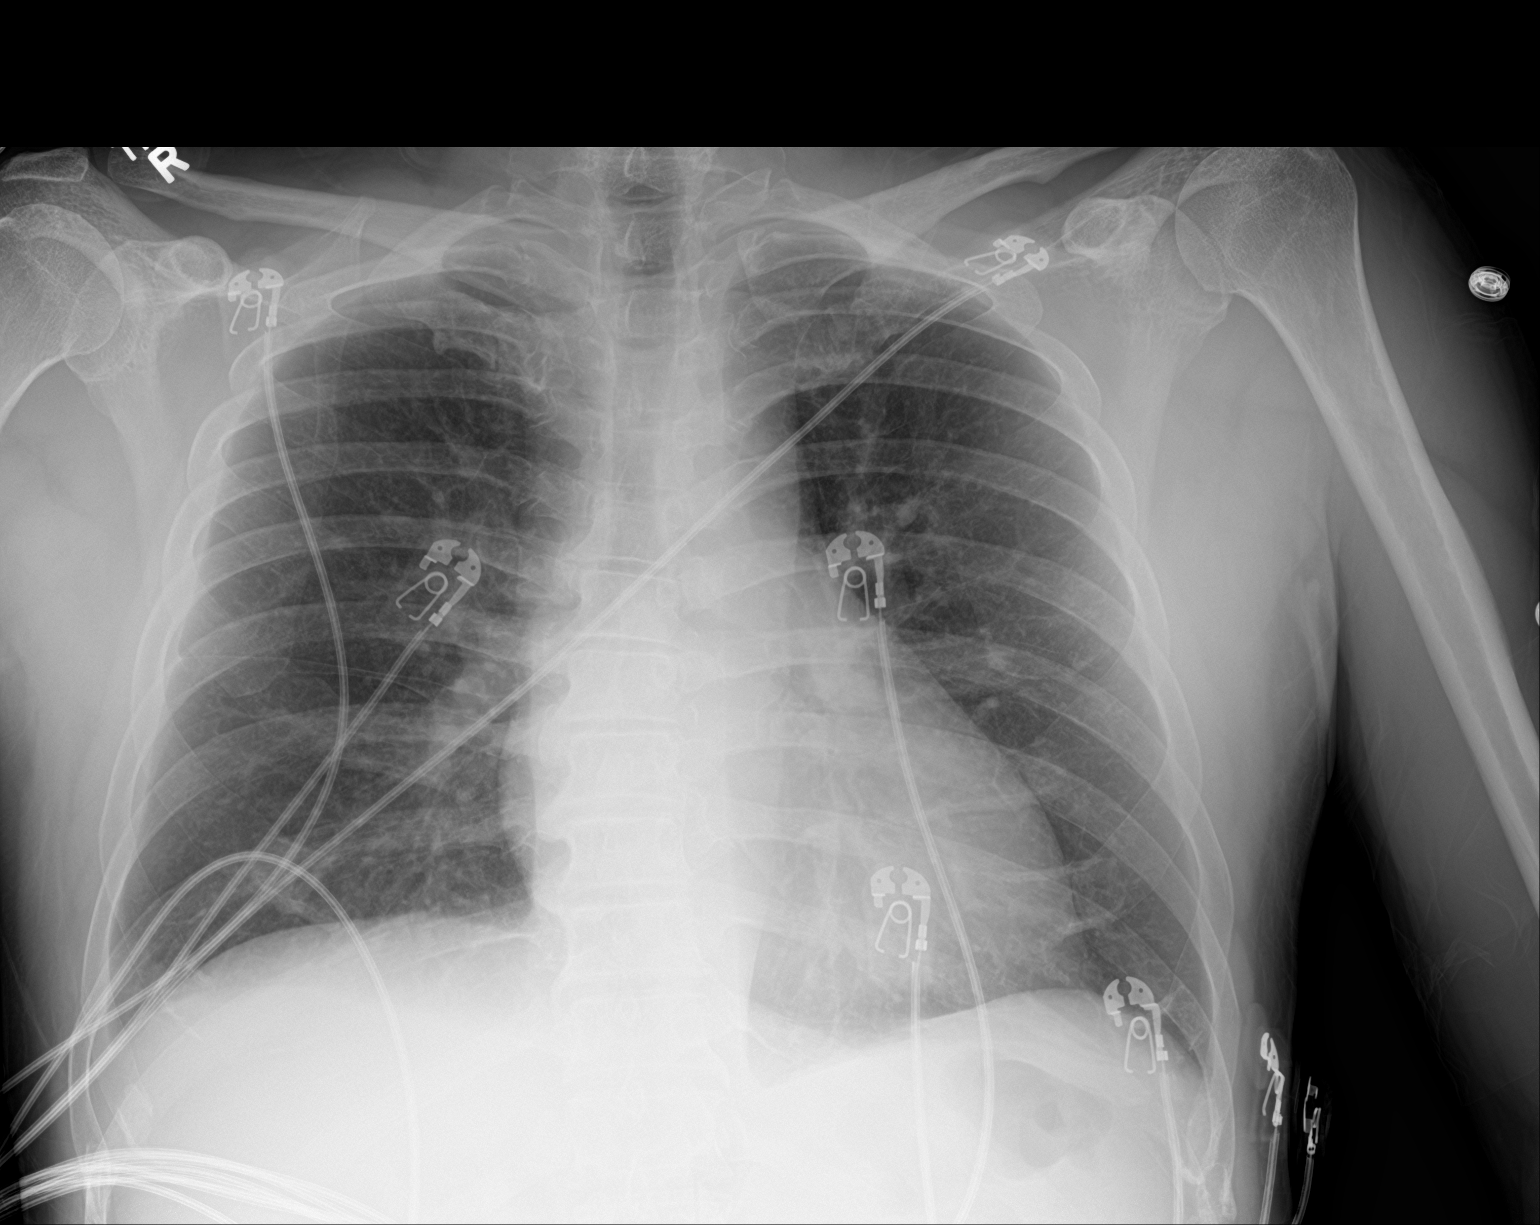

[2 of 2 positions shown; findings below may reference images not displayed]

FINDINGS: Mediastinum and hilar structures are normal. Low lung volumes. Small
bilateral pleural effusions. No pneumothorax.
IMPRESSION: Low lung volumes.  Tiny bilateral pleural effusions.

## 2018-02-08 IMAGING — CT CT ANGIO CHEST
2 of 6 series · 18 of 36 positions shown · IV contrast (Omni 300)
Comparison: [DATE]

CLINICAL DATA: Hemoptysis yesterday. Shortness of breath. Evaluate
for pulmonary embolism.

EXAM:
CT ANGIOGRAPHY CHEST WITH CONTRAST
TECHNIQUE: Multidetector CT imaging of the chest was performed using the
standard protocol during bolus administration of intravenous
contrast. Multiplanar CT image reconstructions and MIPs were
obtained to evaluate the vascular anatomy.
CONTRAST:  100mL [HM] IOPAMIDOL ([HM]) INJECTION 76%

[Series 7: pe thins · axial · 0.71mm/px · z∈[+910,+1184]mm · 17 of 310 slices shown]
[im 18/310  lung]
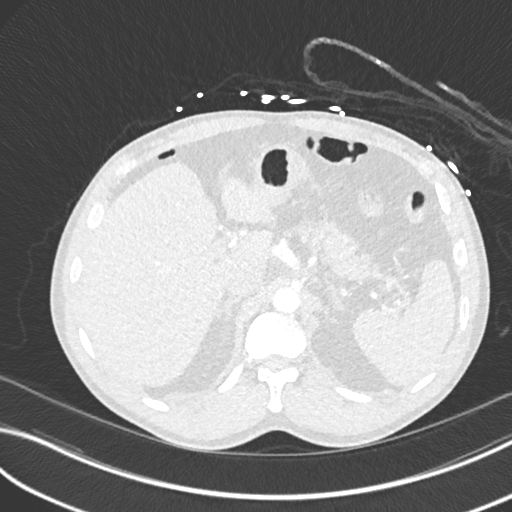
[im 35/310  mediastinal]
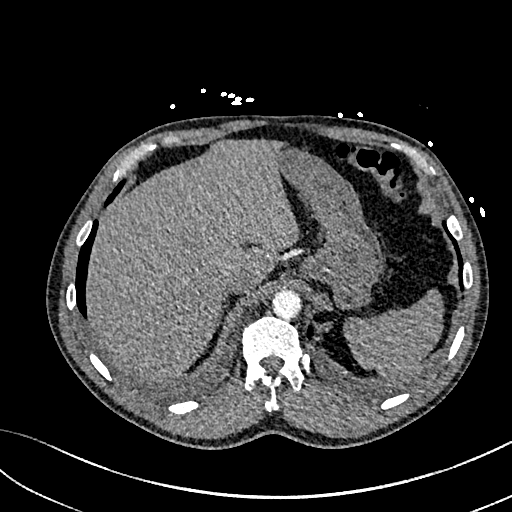
[im 52/310  lung]
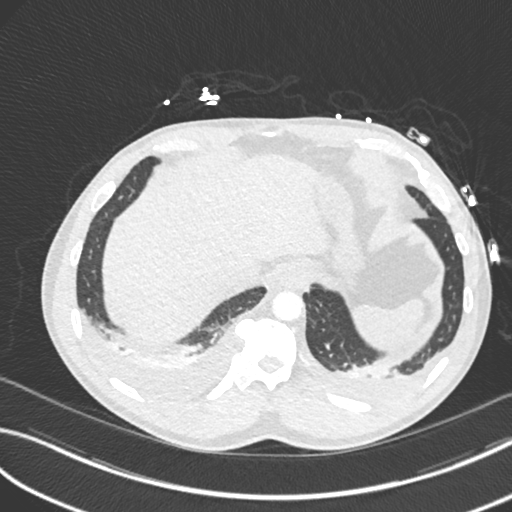
[im 69/310  mediastinal]
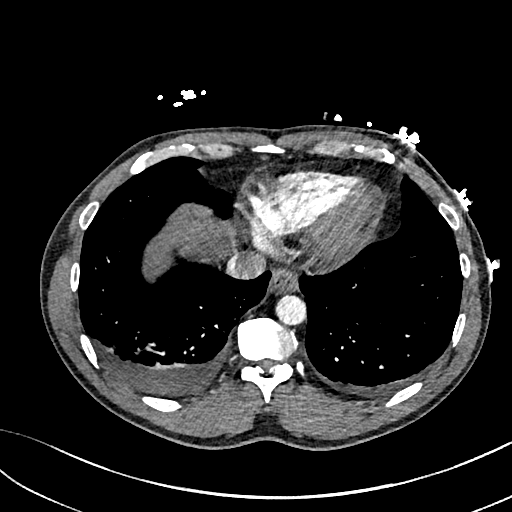
[im 86/310  lung]
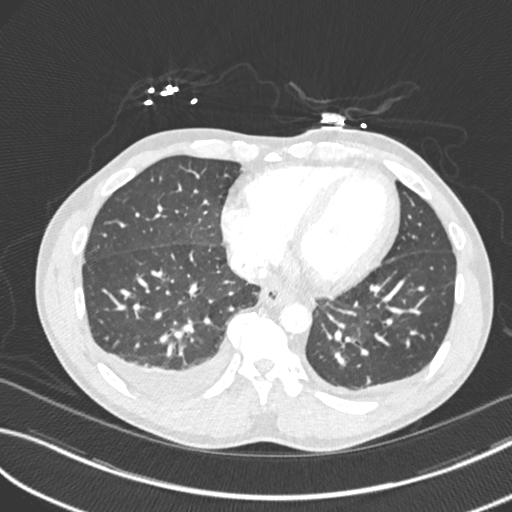
[im 104/310  mediastinal]
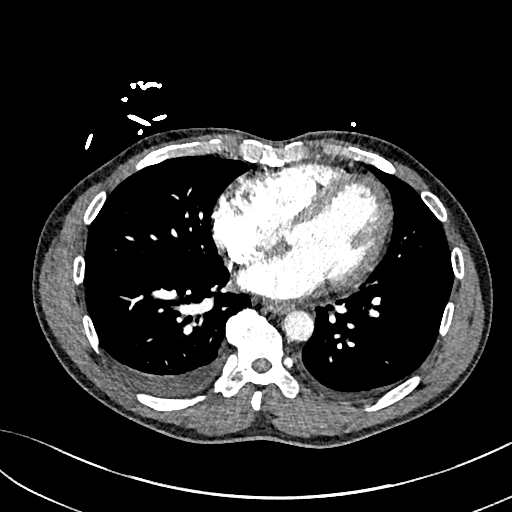
[im 121/310  lung]
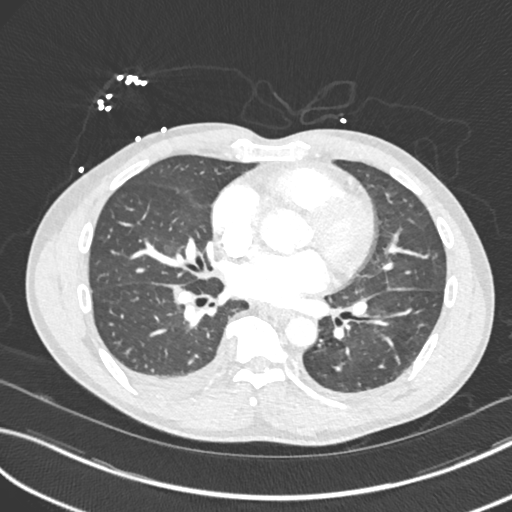
[im 138/310  mediastinal]
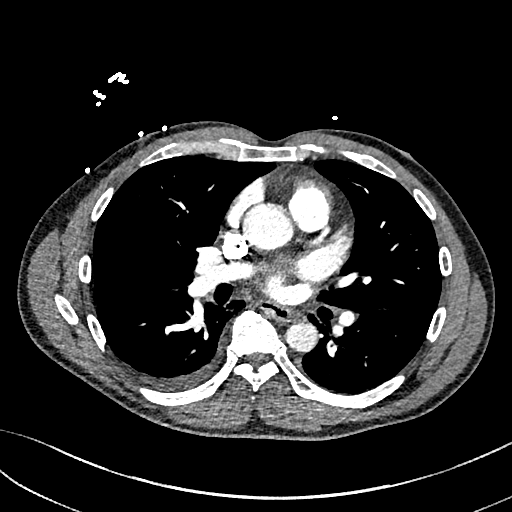
[im 155/310  lung]
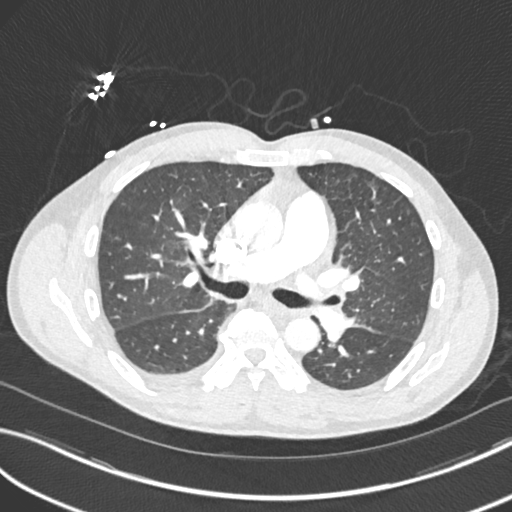
[im 172/310  mediastinal]
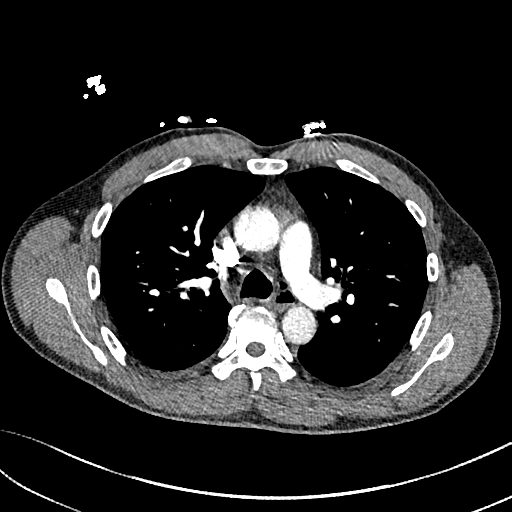
[im 189/310  lung]
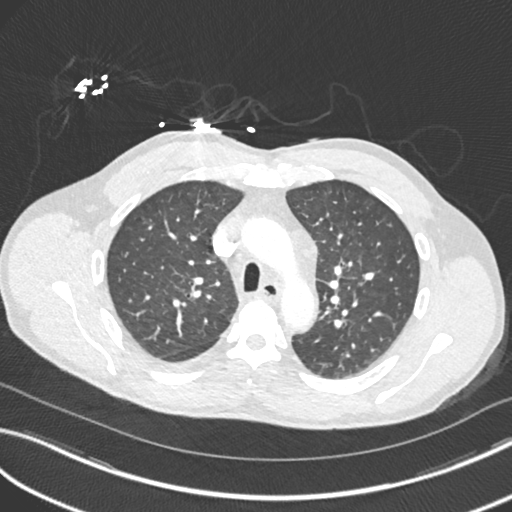
[im 207/310  mediastinal]
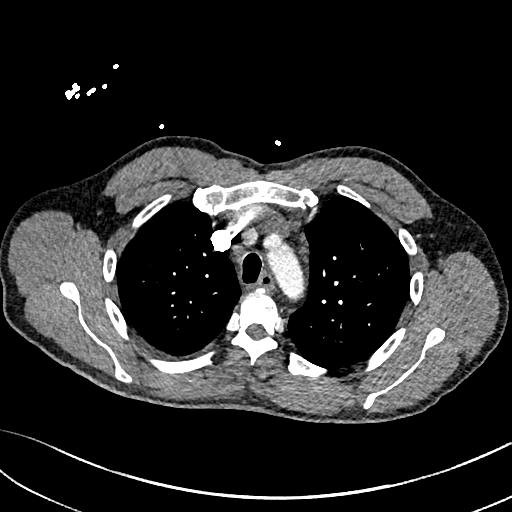
[im 224/310  lung]
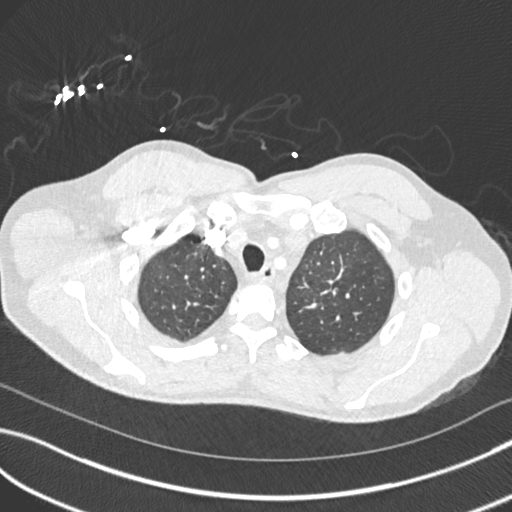
[im 241/310  mediastinal]
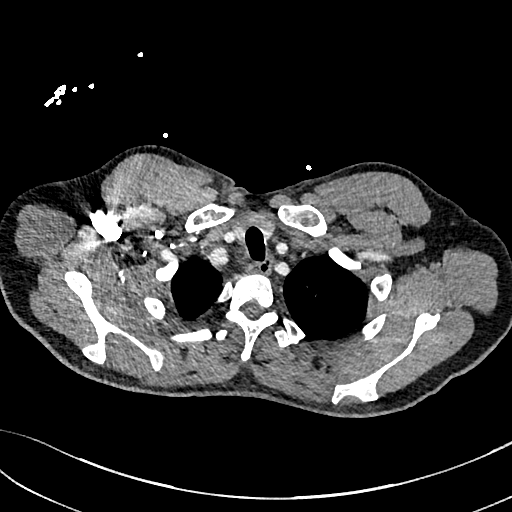
[im 258/310  lung]
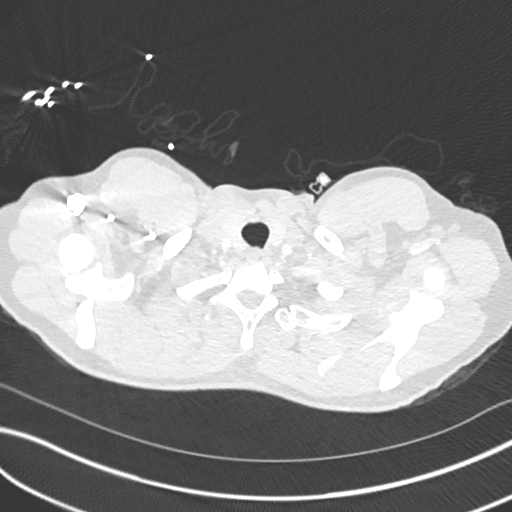
[im 275/310  mediastinal]
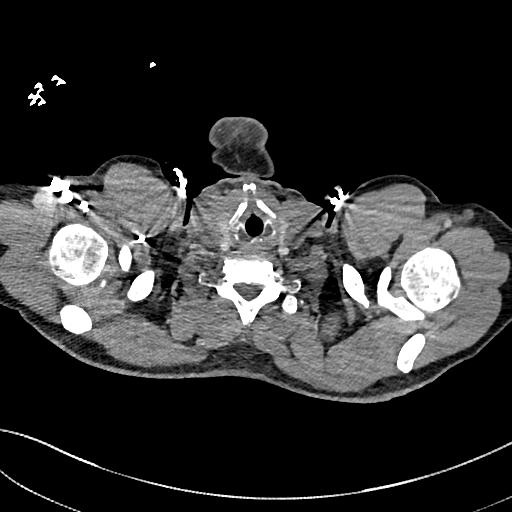
[im 292/310  lung]
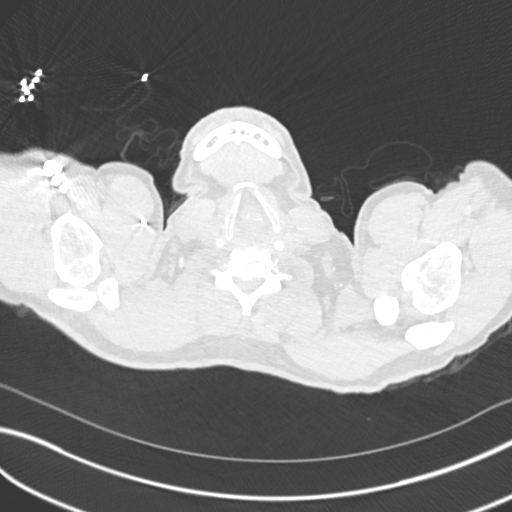

[Series 8: pe 2mm cor · coronal · 0.62mm/px · 1 of 131 slices shown]
[im 66/131  mediastinal]
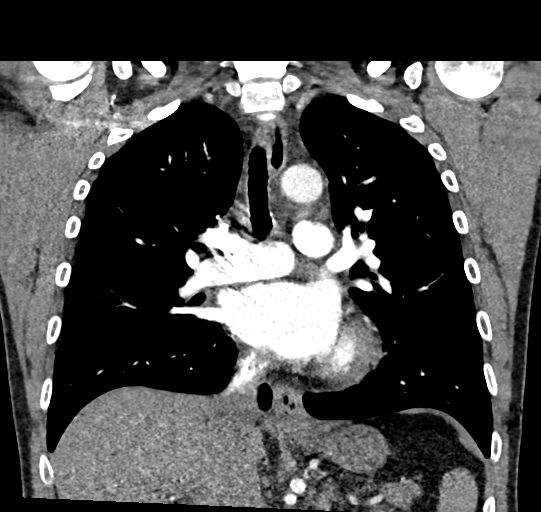

[18 of 36 positions shown; findings below may reference images not displayed]

FINDINGS: Cardiovascular: Satisfactory opacification of the pulmonary arteries
to the segmental level. No evidence of pulmonary embolism. Normal
heart size. No pericardial effusion.

Mediastinum/Nodes: Negative for adenopathy or mass.

Lungs/Pleura: Small pleural effusions, right greater than left, with
mild atelectasis. Centrally there is fissure thickening and on
coronal reformats there is occasional interlobular septal
thickening. 2-3 mm right upper lobe pulmonary nodule, below size
threshold for imaging follow-up in this patient listed as never
smoker.

Upper Abdomen: Negative

Musculoskeletal: Spondylosis.  No acute or aggressive finding.

Review of the MIP images confirms the above findings.
IMPRESSION: 1. Negative for pulmonary embolism.
2. Small pleural effusions and trace edema.
3. Mild atelectasis in the lower lobes.

## 2018-02-08 MED ORDER — POTASSIUM CHLORIDE CRYS ER 20 MEQ PO TBCR
40.0000 meq | EXTENDED_RELEASE_TABLET | Freq: Two times a day (BID) | ORAL | Status: DC
  Administered 2018-02-08: 40 meq via ORAL
  Filled 2018-02-08: qty 2

## 2018-02-08 MED ORDER — PANTOPRAZOLE SODIUM 40 MG PO TBEC
40.0000 mg | DELAYED_RELEASE_TABLET | Freq: Every day | ORAL | Status: DC
  Administered 2018-02-08 – 2018-02-09 (×2): 40 mg via ORAL
  Filled 2018-02-08 (×2): qty 1

## 2018-02-08 MED ORDER — ACETAMINOPHEN 650 MG RE SUPP: 650.0000 mg | Freq: Four times a day (QID) | RECTAL | Status: DC | PRN

## 2018-02-08 MED ORDER — POLYETHYLENE GLYCOL 3350 17 G PO PACK: 17.0000 g | PACK | Freq: Every day | ORAL | Status: DC | PRN

## 2018-02-08 MED ORDER — AMLODIPINE BESYLATE 5 MG PO TABS: 10.0000 mg | ORAL_TABLET | Freq: Every day | ORAL | Status: DC

## 2018-02-08 MED ORDER — ONDANSETRON HCL 4 MG PO TABS: 4.0000 mg | ORAL_TABLET | Freq: Four times a day (QID) | ORAL | Status: DC | PRN

## 2018-02-08 MED ORDER — FUROSEMIDE 10 MG/ML IJ SOLN
40.0000 mg | Freq: Every day | INTRAMUSCULAR | Status: DC
  Administered 2018-02-08 – 2018-02-09 (×2): 40 mg via INTRAVENOUS
  Filled 2018-02-08 (×2): qty 4

## 2018-02-08 MED ORDER — ENOXAPARIN SODIUM 40 MG/0.4ML ~~LOC~~ SOLN
40.0000 mg | SUBCUTANEOUS | Status: DC
  Administered 2018-02-08: 40 mg via SUBCUTANEOUS
  Filled 2018-02-08: qty 0.4

## 2018-02-08 MED ORDER — ACETAMINOPHEN 325 MG PO TABS
650.0000 mg | ORAL_TABLET | Freq: Four times a day (QID) | ORAL | Status: DC | PRN
  Administered 2018-02-08 – 2018-02-09 (×2): 650 mg via ORAL
  Filled 2018-02-08 (×2): qty 2

## 2018-02-08 MED ORDER — LISINOPRIL 10 MG PO TABS
10.0000 mg | ORAL_TABLET | Freq: Every day | ORAL | Status: DC
  Administered 2018-02-08 – 2018-02-09 (×2): 10 mg via ORAL
  Filled 2018-02-08 (×2): qty 1

## 2018-02-08 MED ORDER — METOPROLOL TARTRATE 25 MG PO TABS
25.0000 mg | ORAL_TABLET | Freq: Two times a day (BID) | ORAL | Status: DC
  Administered 2018-02-08 – 2018-02-09 (×2): 25 mg via ORAL
  Filled 2018-02-08 (×2): qty 1

## 2018-02-08 MED ORDER — ASPIRIN 81 MG PO CHEW
81.0000 mg | CHEWABLE_TABLET | Freq: Every day | ORAL | Status: DC
  Administered 2018-02-09: 81 mg via ORAL
  Filled 2018-02-08: qty 1

## 2018-02-08 MED ORDER — FUROSEMIDE 10 MG/ML IJ SOLN: 40.0000 mg | Freq: Every day | INTRAMUSCULAR | Status: DC

## 2018-02-08 MED ORDER — ASPIRIN 81 MG PO CHEW
324.0000 mg | CHEWABLE_TABLET | Freq: Once | ORAL | Status: AC
  Administered 2018-02-08: 324 mg via ORAL
  Filled 2018-02-08: qty 4

## 2018-02-08 MED ORDER — IOPAMIDOL (ISOVUE-370) INJECTION 76%
INTRAVENOUS | Status: AC
  Administered 2018-02-08: 100 mL
  Filled 2018-02-08: qty 100

## 2018-02-08 MED ORDER — ONDANSETRON HCL 4 MG/2ML IJ SOLN: 4.0000 mg | Freq: Four times a day (QID) | INTRAMUSCULAR | Status: DC | PRN

## 2018-02-08 NOTE — Consult Note (Signed)
Reason for Consult: Chest pain/minor EKG changes Referring Physician: Teaching service  Eric Ingram is an 57 y.o. male.  HPI: Patient is 57 year old male with past medical history significant for hypertension, history of microcytic anemia, GERD, was transferred from outpatient clinic to [facility] ED for evaluation of chest pain. Patient states he had chest pain last night radiating to both shoulders chest pain or increases with coughing and movement. EKG done in the clinic showed normal sinus rhythm with LVH with strain pattern versus inferolateral ischemia which were new as compared to prior EKG. Patient denies any exertional chest pain. Denies PND orthopnea but was noted to have minimal ankle swelling. Patient was noted to have mildly elevated BNP and troponin I has been negative. Patient denies any palpitation lightheadedness or syncope. Patient also was noted to have minimally elevated BNP subsequently had CT and 2 which was negative for pulmonary embolism. Patient denies any fever or chills. Denies any history of tobacco abuse or family history of coronary artery disease. Denies any cardiac workup in the past.  Past Medical History:  Diagnosis Date  . Hypertension     Past Surgical History:  Procedure Laterality Date  . NO PAST SURGERIES      Family History  Family history unknown: Yes    Social History:  reports that  has never smoked. he has never used smokeless tobacco. He reports that he does not drink alcohol or use drugs.  Allergies: No Known Allergies  Medications: I have reviewed the patient's current medications.  Results for orders placed or performed during the hospital encounter of 02/08/18 (from the past 48 hour(s))  Brain natriuretic peptide (order if patient c/o SOB ONLY)     Status: Abnormal   Collection Time: 02/08/18  1:27 PM  Result Value Ref Range   B Natriuretic Peptide 204.8 (H) 0.0 - 100.0 pg/mL    Comment: Performed at [facility], [address], [place], [place] [address]  Basic metabolic panel     Status: Abnormal   Collection Time: 02/08/18  1:30 PM  Result Value Ref Range   Sodium 133 (L) 135 - 145 mmol/L   Potassium 3.0 (L) 3.5 - 5.1 mmol/L   Chloride 101 101 - 111 mmol/L   CO2 18 (L) 22 - 32 mmol/L   Glucose, Bld 111 (H) 65 - 99 mg/dL   BUN 16 6 - 20 mg/dL   Creatinine, Ser 1.25 (H) 0.61 - 1.24 mg/dL   Calcium 8.6 (L) 8.9 - 10.3 mg/dL   GFR calc non Af Amer >60 >60 mL/min   GFR calc Af Amer >60 >60 mL/min    Comment: (NOTE) The eGFR has been calculated using the CKD EPI equation. This calculation has not been validated in all clinical situations. eGFR's persistently <60 mL/min signify possible Chronic Kidney Disease.    Anion gap 14 5 - 15    Comment: Performed at [facility] [address], [place], [place] [address]  CBC     Status: Abnormal   Collection Time: 02/08/18  1:30 PM  Result Value Ref Range   WBC 12.1 (H) 4.0 - 10.5 K/uL   RBC 5.14 4.22 - 5.81 MIL/uL   Hemoglobin 11.7 (L) 13.0 - 17.0 g/dL   HCT 33.6 (L) 39.0 - 52.0 %   MCV 65.4 (L) 78.0 - 100.0 fL   MCH 22.8 (L) 26.0 - 34.0 pg   MCHC 34.8 30.0 - 36.0 g/dL   RDW 15.0 11.5 - 15.5 %  Platelets 391 150 - 400 K/uL    Comment: Performed at [facility], [place] [address], [place], [place] [address]  D-dimer, quantitative (not at [facility])     Status: Abnormal   Collection Time: 02/08/18  1:30 PM  Result Value Ref Range   D-Dimer, Quant 1.06 (H) 0.00 - 0.50 ug/mL-FEU    Comment: (NOTE) At the manufacturer cut-off of 0.50 ug/mL FEU, this assay has been documented to exclude PE with a sensitivity and negative predictive value of 97 to 99%.  At this time, this assay has not been approved by the FDA to exclude DVT/VTE. Results should be correlated with clinical presentation. Performed at [facility], [place] [address], [place], [place] [address]   I-stat troponin, ED (0, 3)     Status: None   Collection Time: 02/08/18  1:52 PM  Result Value  Ref Range   Troponin i, poc 0.00 0.00 - 0.08 ng/mL   Comment 3            Comment: Due to the release kinetics of cTnI, a negative result within the first hours of the onset of symptoms does not rule out myocardial infarction with certainty. If myocardial infarction is still suspected, repeat the test at appropriate intervals.   I-stat troponin, ED (0, 3)     Status: None   Collection Time: 02/08/18  5:35 PM  Result Value Ref Range   Troponin i, poc 0.00 0.00 - 0.08 ng/mL   Comment 3            Comment: Due to the release kinetics of cTnI, a negative result within the first hours of the onset of symptoms does not rule out myocardial infarction with certainty. If myocardial infarction is still suspected, repeat the test at appropriate intervals.     Dg Chest 2 View  Result Date: 02/08/2018 CLINICAL DATA:  Chest pain.  Shortness of breath. EXAM: CHEST  2 VIEW COMPARISON:  02/04/2017. FINDINGS: Mediastinum and hilar structures are normal. Low lung volumes. Small bilateral pleural effusions. No pneumothorax. IMPRESSION: Low lung volumes.  Tiny bilateral pleural effusions. Electronically Signed   By: Marcello Moores  Register   On: 02/08/2018 14:49   Ct Angio Chest Pe W And/or Wo Contrast  Result Date: 02/08/2018 CLINICAL DATA:  Hemoptysis yesterday. Shortness of breath. Evaluate for pulmonary embolism. EXAM: CT ANGIOGRAPHY CHEST WITH CONTRAST TECHNIQUE: Multidetector CT imaging of the chest was performed using the standard protocol during bolus administration of intravenous contrast. Multiplanar CT image reconstructions and MIPs were obtained to evaluate the vascular anatomy. CONTRAST:  143m ISOVUE-370 IOPAMIDOL (ISOVUE-370) INJECTION 76% COMPARISON:  08/25/2017 FINDINGS: Cardiovascular: Satisfactory opacification of the pulmonary arteries to the segmental level. No evidence of pulmonary embolism. Normal heart size. No pericardial effusion. Mediastinum/Nodes: Negative for adenopathy or mass.  Lungs/Pleura: Small pleural effusions, right greater than left, with mild atelectasis. Centrally there is fissure thickening and on coronal reformats there is occasional interlobular septal thickening. 2-3 mm right upper lobe pulmonary nodule, below size threshold for imaging follow-up in this patient listed as never smoker. Upper Abdomen: Negative Musculoskeletal: Spondylosis.  No acute or aggressive finding. Review of the MIP images confirms the above findings. IMPRESSION: 1. Negative for pulmonary embolism. 2. Small pleural effusions and trace edema. 3. Mild atelectasis in the lower lobes. Electronically Signed   By: JMonte FantasiaM.D.   On: 02/08/2018 15:41    Review of Systems  Constitutional: Negative for chills and fever.  Respiratory: Positive for cough.  Cardiovascular: Positive for chest pain and leg swelling. Negative for palpitations and orthopnea.  Gastrointestinal: Negative for abdominal pain and vomiting.  Genitourinary: Negative for dysuria.  Neurological: Negative for dizziness.   Blood pressure 135/72, pulse (!) 102, resp. rate 18, SpO2 97 %. Physical Exam  Constitutional: He is oriented to person, place, and time.  HENT:  Head: Normocephalic and atraumatic.  Eyes: Conjunctivae are normal. Pupils are equal, round, and reactive to light. Left eye exhibits no discharge.  Neck: Normal range of motion. Neck supple. No JVD present. No tracheal deviation present. No thyromegaly present.  Cardiovascular:  Tachycardic S1 and S2 soft  Respiratory:   decreased breath sound at bases  GI: Soft. Bowel sounds are normal. He exhibits no distension. There is no tenderness. There is no rebound.  Musculoskeletal:  No clubbing cyanosis 1+ ankle edema noted  Neurological: He is alert and oriented to person, place, and time.    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 Replace K  Charolette Forward 02/08/2018, 6:06 PM

## 2018-02-08 NOTE — H&P (Addendum)
Date: 02/08/2018               Patient Name:  Eric Ingram MRN: [id number]  DOB: 08-24-1961 Age / Sex: 57 y.o., male   PCP: Lorella Nimrod, MD         Medical Service: Internal Medicine Teaching Service         Attending Physician: Dr. Oval Linsey, MD    First Contact: Dr. [name] Pager: [phone]  Second Contact: Dr. Lorella Nimrod Pager: [phone]       After Hours (After 5p/  First Contact Pager: [phone]  weekends / holidays): Second Contact Pager: [phone]   Chief Complaint: Midline chest and upper abdominal pain  History of Present Illness: 57 year old male with history of hypertension, microcytic anemia . patient describes what sounds like chest discomfort occurring in 3-hour intervals beginning on Sunday.  This pain was considered to be pleuritic, associated with cough.  Was also described as a burning type pain that did not radiate, was not exertional, was not associated with eating.  Patient does report frequent vomiting sometimes multiple times per day.  In some instances he reports a small amount of blood.  He has had no recent illness, he denies any fevers chills or night sweats.  He denies orthopnea or paroxysmal nocturnal dyspnea.  He has noticed recent swelling in his lower extremities.  This patient was seen in the clinic and the EKG was concerning for heart failure with LVH strain pattern.  He also had an elevated BNP.  ED course: He arrived tachycardic mildly elevated blood pressure, no fever saturating well on room air.  He had a mildly elevated BNP at 204.8, a d-dimer that was elevated at 1.06, he had a mild leukocytosis, hemoglobin was 11.7 with an MCV of 65.4 with normal platelet count.  His hypokalemic with a creatinine of 1.25 which seems to be close to his baseline may be slightly higher.  He has had 3- troponins so far, he had a CT angios that was negative for PE did show small bilateral pleural effusions and mild edema.  He had 3 troponins that were  negative.    Meds:  Current Meds  Medication Sig  . amLODipine (NORVASC) 10 MG tablet Take 1 tablet (10 mg total) by mouth daily.  . [DISCONTINUED] fluticasone (FLONASE) 50 MCG/ACT nasal spray Place 1 spray into both nostrils daily.  . [DISCONTINUED] ibuprofen (ADVIL,MOTRIN) 600 MG tablet Take 1 tablet (600 mg total) by mouth every 6 (six) hours as needed.     Allergies: Allergies as of 02/08/2018  . (No Known Allergies)   Past Medical History:  Diagnosis Date  . Hypertension     Family History:  Family History  Family history unknown: Yes    Social History:  Social History   Socioeconomic History  . Marital status: Married    Spouse name: Not on file  . Number of children: Not on file  . Years of education: Not on file  . Highest education level: Not on file  Social Needs  . Financial resource strain: Not on file  . Food insecurity - worry: Not on file  . Food insecurity - inability: Not on file  . Transportation needs - medical: Not on file  . Transportation needs - non-medical: Not on file  Occupational History  . Not on file  Tobacco Use  . Smoking status: Never Smoker  . Smokeless tobacco: Never Used  Substance and Sexual Activity  . Alcohol use: No  .  Drug use: No  . Sexual activity: Not on file  Other Topics Concern  . Not on file  Social History Narrative  . Not on file    Review of Systems: A complete ROS was negative except as per HPI.   Physical Exam: Blood pressure 135/77, pulse (!) 102, resp. rate (!) 24, SpO2 96 %.  Physical Exam  Constitutional: He is oriented to person, place, and time. He appears well-developed and well-nourished.  Eyes: Right eye exhibits no discharge. Left eye exhibits no discharge. No scleral icterus.  Neck: JVD (4cm above clavicle) present.  Cardiovascular: Regular rhythm, normal heart sounds and intact distal pulses. Tachycardia present. PMI is displaced (displaced to right). Exam reveals no gallop and no friction  rub.  No murmur heard. Pulmonary/Chest: Effort normal. No respiratory distress. He has no wheezes. He has rales (mild bibasilar).  Abdominal: Soft. Bowel sounds are normal. He exhibits no distension and no mass. There is no tenderness. There is no guarding.  Musculoskeletal: He exhibits edema (bilateral LE 2+).  Neurological: He is alert and oriented to person, place, and time.    EKG: personally reviewed my interpretation is  LVH sinus tachycardia, t wave changes 2/2 LH strain  CXR: personally reviewed my interpretation is mild interstitial edema, bilateral mild pleural effusions  Assessment & Plan by Problem: Active Problems:   Chest pain  Chest pain Symptoms suggestive of GERD, also a cough and pleuritic pain suspicious for worsening heart failure  -cardiology consulted -ECHO ordered -Trending 3x more troponins -continuous cardiac monitoring -strict I's and O's -lasix 40mg  IV beginning tomorrow -asa 81mg    GERD Pt with symptoms concerning for GERD, currently has been taking no medications to help with this  -started pantoprazole 40mg   HTN Chronic essential  -lisinopril 10mg  -metoprolol tartrate 25mg  BID  Iron deficiency anemia Suspected GI blood loss chronic, Has not followed up with GI as directed  -monitor CBC -FOBT  Dispo: Admit patient to Observation with expected length of stay less than 2 midnights.  Signed: Katherine Roan, MD 02/08/2018, 7:53 PM  [name] MD PGY-1 Internal Medicine Pager # [phone]

## 2018-02-08 NOTE — ED Notes (Signed)
Pt called X 4. Will try to call again.

## 2018-02-08 NOTE — Assessment & Plan Note (Addendum)
Today he developed symptoms of squeezing substernal chest pain associated with palpitations and diaphoresis, shortness of breath and orthopnea, and lower extremity edema.  The chest pain has been constant since the onset, it is not worse with exertion and it is non radiating. The pain is also associated with a cough productive of blood tinged sputum, odynophasia and nausea, and decreased energy. He denies fever, dysphasia, metallic taste in the mouth, vomiting or diarrhea. He's never had symptoms like this in the past. He has no history of tobacco or alcohol use, he does not drink high amounts of warm liquids.  Was seen in clinic about 1 week ago with generalized abdominal pain associated with bloating, he says thatthat pain that he is having today is different. EKG showed left ventricular hypertrophy and T wave inversions in lateral leads which are a change from EKG earlier this month. This history and EKG changes are concerning for acute heart failure and NSTEMI. He will need to be admitted for serial troponins and Echo should be considered. If cardiac cause is ruled out symptomatic anemia and gastroesophageal reflux should be considered.  - Stat troponin and BNP  - Transferred straight to the ED, he will bypass the waiting room and go straight to an ED room  - will need to check a CBC and give nitro and aspirin

## 2018-02-08 NOTE — ED Provider Notes (Addendum)
Eric Ingram Provider Note   CSN: [id number] Arrival date & time: 02/08/18  1240     History   Chief Complaint Chief Complaint  Patient presents with  . Hemoptysis    HPI Eric Ingram is a 57 y.o. male with h/o HTN and anemia here from internal medicine clinic for central, sharp, non radiating, constant chest pain that began yesterday while at rest. Worse with taking deep breaths and coughing which takes his breath away. Associated with subjective fevers, "mucus in chest", bilateral ankle swelling, cough with blood streaks in it, nasal congestion. He feels more tired and cold. Denies sick contacts, exertional CP, abdominal pain, diarrhea, constipation, sore throat. No cardiac history. No use of ETOH or tobacco. Per IM provider pt has new TWI in lateral leads and LVH changed from last EKG. There is a concern for NSTEMI and HF. History limited by language barrier, obtained through [facility] Hme from IM clinic at bedside.   HPI  Past Medical History:  Diagnosis Date  . Hypertension     Patient Active Problem List   Diagnosis Date Noted  . Chest pain 02/08/2018  . Generalized abdominal pain 02/01/2018  . Health care maintenance 01/05/2018  . Microcytic anemia 10/16/2017  . Pain of left scapula 10/16/2017  . Hypertension 10/16/2017    Past Surgical History:  Procedure Laterality Date  . NO PAST SURGERIES         Home Medications    Prior to Admission medications   Medication Sig Start Date End Date Taking? Authorizing Provider  amLODipine (NORVASC) 10 MG tablet Take 1 tablet (10 mg total) by mouth daily. 01/28/18 02/27/18  Lorella Nimrod, MD  fluticasone (FLONASE) 50 MCG/ACT nasal spray Place 1 spray into both nostrils daily. 01/05/18 01/05/19  Lorella Nimrod, MD  ibuprofen (ADVIL,MOTRIN) 600 MG tablet Take 1 tablet (600 mg total) by mouth every 6 (six) hours as needed. 10/14/17   Maryellen Pile, MD  ondansetron (ZOFRAN ODT) 4 MG disintegrating  tablet Take 1 tablet (4 mg total) by mouth every 8 (eight) hours as needed for nausea or vomiting. 01/22/18   Long, Wonda Olds, MD  sodium chloride (OCEAN) 0.65 % SOLN nasal spray Place 1 spray into both nostrils as needed for congestion. 01/05/18   Lorella Nimrod, MD    Family History Family History  Family history unknown: Yes    Social History Social History   Tobacco Use  . Smoking status: Never Smoker  . Smokeless tobacco: Never Used  Substance Use Topics  . Alcohol use: No  . Drug use: No     Allergies   Patient has no known allergies.   Review of Systems Review of Systems  Constitutional: Positive for chills, fatigue and fever (subjective).  HENT: Positive for congestion.   Respiratory: Positive for cough and shortness of breath.   Cardiovascular: Positive for chest pain and palpitations.  All other systems reviewed and are negative.    Physical Exam Updated Vital Signs BP 125/77   Pulse (!) 105   Resp (!) 21   SpO2 97%   Physical Exam  Constitutional: He appears well-developed and well-nourished.  NAD. Non toxic.   HENT:  Head: Normocephalic and atraumatic.  Nose: Nose normal.  Moist mucous membranes. Tonsils and oropharynx normal  Eyes: Conjunctivae, EOM and lids are normal.  Neck: Trachea normal and normal range of motion.  Neck is supple Trachea midline No cervical adenopathy  Cardiovascular: Normal rate, regular rhythm, S1 normal, S2 normal and  normal heart sounds.  Pulses:      Carotid pulses are 2+ on the right side, and 2+ on the left side.      Radial pulses are 2+ on the right side, and 2+ on the left side.       Dorsalis pedis pulses are 2+ on the right side, and 2+ on the left side.  HR 105-110. Symmetric 1+ pitting edema to ankles.  No LE edema or calf tenderness.   Pulmonary/Chest: Effort normal. No respiratory distress. He has decreased breath sounds in the right lower field and the left lower field. He has no rhonchi.  No reproducible  chest wall tenderness. CP not reproducible with AROM of upper extremities. No rales or wheezing.   Abdominal: Soft. Bowel sounds are normal. There is no tenderness.  No epigastric tenderness. No distention.   Neurological: He is alert. GCS eye subscore is 4. GCS verbal subscore is 5. GCS motor subscore is 6.  Skin: Skin is warm and dry. Capillary refill takes less than 2 seconds.  No rash to chest wall  Psychiatric: He has a normal mood and affect. His speech is normal and behavior is normal. Judgment and thought content normal. Cognition and memory are normal.     ED Treatments / Results  Labs (all labs ordered are listed, but only abnormal results are displayed) Labs Reviewed  BASIC METABOLIC PANEL - Abnormal; Notable for the following components:      Result Value   Sodium 133 (*)    Potassium 3.0 (*)    CO2 18 (*)    Glucose, Bld 111 (*)    Creatinine, Ser 1.25 (*)    Calcium 8.6 (*)    All other components within normal limits  CBC - Abnormal; Notable for the following components:   WBC 12.1 (*)    Hemoglobin 11.7 (*)    HCT 33.6 (*)    MCV 65.4 (*)    MCH 22.8 (*)    All other components within normal limits  BRAIN NATRIURETIC PEPTIDE - Abnormal; Notable for the following components:   B Natriuretic Peptide 204.8 (*)    All other components within normal limits  D-DIMER, QUANTITATIVE (NOT AT [facility]) - Abnormal; Notable for the following components:   D-Dimer, Quant 1.06 (*)    All other components within normal limits  I-STAT TROPONIN, ED  CBG MONITORING, ED  I-STAT TROPONIN, ED    EKG  EKG Interpretation  Date/Time:  Tuesday February 08 2018 13:11:43 EST Ventricular Rate:  107 PR Interval:    QRS Duration: 82 QT Interval:  314 QTC Calculation: 419 R Axis:   85 Text Interpretation:  Sinus tachycardia Ventricular premature complex Abnormal R-wave progression, early transition Nonspecific repol abnormality, diffuse leads Abnormal ekg Confirmed by Carmin Muskrat  [phone]) on 02/08/2018 1:30:16 PM       Radiology Dg Chest 2 View  Result Date: 02/08/2018 CLINICAL DATA:  Chest pain.  Shortness of breath. EXAM: CHEST  2 VIEW COMPARISON:  02/04/2017. FINDINGS: Mediastinum and hilar structures are normal. Low lung volumes. Small bilateral pleural effusions. No pneumothorax. IMPRESSION: Low lung volumes.  Tiny bilateral pleural effusions. Electronically Signed   By: Marcello Moores  Register   On: 02/08/2018 14:49   Ct Angio Chest Pe W And/or Wo Contrast  Result Date: 02/08/2018 CLINICAL DATA:  Hemoptysis yesterday. Shortness of breath. Evaluate for pulmonary embolism. EXAM: CT ANGIOGRAPHY CHEST WITH CONTRAST TECHNIQUE: Multidetector CT imaging of the chest was performed using the standard protocol during bolus  administration of intravenous contrast. Multiplanar CT image reconstructions and MIPs were obtained to evaluate the vascular anatomy. CONTRAST:  166mL ISOVUE-370 IOPAMIDOL (ISOVUE-370) INJECTION 76% COMPARISON:  08/25/2017 FINDINGS: Cardiovascular: Satisfactory opacification of the pulmonary arteries to the segmental level. No evidence of pulmonary embolism. Normal heart size. No pericardial effusion. Mediastinum/Nodes: Negative for adenopathy or mass. Lungs/Pleura: Small pleural effusions, right greater than left, with mild atelectasis. Centrally there is fissure thickening and on coronal reformats there is occasional interlobular septal thickening. 2-3 mm right upper lobe pulmonary nodule, below size threshold for imaging follow-up in this patient listed as never smoker. Upper Abdomen: Negative Musculoskeletal: Spondylosis.  No acute or aggressive finding. Review of the MIP images confirms the above findings. IMPRESSION: 1. Negative for pulmonary embolism. 2. Small pleural effusions and trace edema. 3. Mild atelectasis in the lower lobes. Electronically Signed   By: Monte Fantasia M.D.   On: 02/08/2018 15:41    Procedures Procedures (including critical care  time)  Medications Ordered in ED Medications  aspirin chewable tablet 324 mg (324 mg Oral Given 02/08/18 1544)  iopamidol (ISOVUE-370) 76 % injection (100 mLs  Contrast Given 02/08/18 1450)     Initial Impression / Assessment and Plan / ED Course  I have reviewed the triage vital signs and the nursing notes.  Pertinent labs & imaging results that were available during my care of the patient were reviewed by me and considered in my medical decision making (see chart for details).  Clinical Course as of Feb 09 1644  Tue Feb 08, 2018  1453 IMPRESSION: Low lung volumes. Tiny bilateral pleural effusions.   DG Chest 2 View [CG]  1453 WBC: (!) 12.1 [CG]  1453 Potassium: (!) 3.0 [CG]  1453 Creatinine: (!) 1.25 [CG]  1453 GFR, Est Non African American: >60 [CG]  1453 D-Dimer, Quant: (!) 1.06 [CG]  1453 B Natriuretic Peptide: (!) 204.8 [CG]    Clinical Course User Index [CG] Carmon Sails J, PA-C   CP sounds atypical. HEART score is 2-3. New TWI concerning however. Will evaluate for ACS, PNA, influenza, PE, CHF. EKG in ED with TWI in lead 4 only. EKG done at clinic reviewed shows TWI to lead 4 and 5, new.   Final Clinical Impressions(s) / ED Diagnoses   CTA without PE. Tiny pleural effusions. BNP 204. Will consult IM for admission. Dr. Terrence Dupont agrees to see pt if admitted.   1645: Spoke to IM admitting attending who will se pt in ED. Disposition to be determined by IM team.  Final diagnoses:  None    ED Discharge Orders    None         Arlean Hopping 02/08/18 1645    Carmin Muskrat, MD 02/09/18 [phone]

## 2018-02-08 NOTE — Plan of Care (Signed)
Pt and family oriented to room, plan of care, and method of reporting concerns. Verbalizes understanding of education. Call light and bedside table within reach

## 2018-02-08 NOTE — ED Triage Notes (Signed)
Pt brought from internal medicine clinic with multiple complaints. He was coughing up blood yesterday, reports pain when he swallows, fatigue, and shortness of breath. Pt in no acute distress. Pt speaks [place].

## 2018-02-08 NOTE — ED Notes (Signed)
Internal medicine at bedside

## 2018-02-08 NOTE — Progress Notes (Signed)
   CC: chest pain, hemoptysis, lower extremity edema, decreased energy   HPI:  [name] is a 57 y.o. with PMH hypertension and microcytic anemia who presents for acute concern of chest pain, hemoptysis, lower extremity edema, decreased energy which all began yesterday. Please see the assessment and plans for the status of the patient chronic medical problems.   Past Medical History:  Diagnosis Date  . Hypertension    Review of Systems:  Refer to history of present illness and assessment and plans for pertinent review of systems, all others reviewed and negative  Physical Exam:  Vitals:   02/08/18 1038  BP: 127/71  Pulse: (!) 106  Temp: (!) 97.5 F (36.4 C)  TempSrc: Oral  SpO2: 100%  Weight: 139 lb 4.8 oz (63.2 kg)  Height: 5\' 2"  (1.575 m)   General: uncomfortable appearing, diaphoretic   Cardiac: tachycardic, regular rhythm, JVD to the lower ear lobe, 1+ bilateral lower extremity edema  Pulm: no tenderness to palpation of the chest wall, no respiratory distress, lungs are clear to auscultation Gastrointestinal: BS +, soft, non tender    Assessment & Plan:   Chest pain  Today he developed symptoms of squeezing substernal chest pain associated with palpitations and diaphoresis, shortness of breath and orthopnea, and lower extremity edema.  The chest pain has been constant since the onset, it is not worse with exertion and it is non radiating. The pain is also associated with a cough productive of blood tinged sputum, odynophasia and nausea, and decreased energy. He denies fever, dysphasia, metallic taste in the mouth, vomiting or diarrhea. He's never had symptoms like this in the past. He has no history of tobacco or alcohol use, he does not drink high amounts of warm liquids.  Was seen in clinic about 1 week ago with generalized abdominal pain associated with bloating, he says thatthat pain that he is having today is different. EKG showed left ventricular hypertrophy and T  wave inversions in lateral leads which are a change from EKG earlier this month. This history and EKG changes are concerning for acute heart failure and NSTEMI. He will need to be admitted for serial troponins and Echo should be considered. If cardiac cause is ruled out symptomatic anemia and gastroesophageal reflux should be considered.  - Stat troponin and BNP  - Transferred straight to the ED, he will bypass the waiting room and go straight to an ED room  - will need to check a CBC and give nitro and aspirin   See Encounters Tab for problem based charting.  Patient seen with Dr. Evette Doffing

## 2018-02-09 ENCOUNTER — Observation Stay (HOSPITAL_COMMUNITY): Payer: Self-pay

## 2018-02-09 ENCOUNTER — Telehealth: Payer: Self-pay | Admitting: Internal Medicine

## 2018-02-09 ENCOUNTER — Other Ambulatory Visit: Payer: Self-pay

## 2018-02-09 ENCOUNTER — Observation Stay (HOSPITAL_BASED_OUTPATIENT_CLINIC_OR_DEPARTMENT_OTHER): Payer: Self-pay

## 2018-02-09 DIAGNOSIS — D560 Alpha thalassemia: Secondary | ICD-10-CM

## 2018-02-09 DIAGNOSIS — D500 Iron deficiency anemia secondary to blood loss (chronic): Secondary | ICD-10-CM

## 2018-02-09 DIAGNOSIS — I5033 Acute on chronic diastolic (congestive) heart failure: Secondary | ICD-10-CM

## 2018-02-09 DIAGNOSIS — R0789 Other chest pain: Secondary | ICD-10-CM

## 2018-02-09 DIAGNOSIS — I110 Hypertensive heart disease with heart failure: Secondary | ICD-10-CM

## 2018-02-09 LAB — TROPONIN I
Troponin I: <0.03 ng/mL (ref <0.03)
Troponin I: <0.03 ng/mL (ref <0.03)

## 2018-02-09 LAB — LIPID PANEL
Cholesterol: 139 mg/dL (ref 0–200)
HDL: 36 mg/dL — AB (ref >40)
LDL Cholesterol: 79 mg/dL (ref 0–99)
TRIGLYCERIDES: 119 mg/dL (ref <150)
Total CHOL/HDL Ratio: 3.9 RATIO
VLDL: 24 mg/dL (ref 0–40)

## 2018-02-09 LAB — COMPREHENSIVE METABOLIC PANEL
ALBUMIN: 3.1 g/dL — AB (ref 3.5–5.0)
ALT: 15 U/L — AB (ref 17–63)
AST: 31 U/L (ref 15–41)
Alkaline Phosphatase: 70 U/L (ref 38–126)
Anion gap: 12 (ref 5–15)
BILIRUBIN TOTAL: 1 mg/dL (ref 0.3–1.2)
BUN: 13 mg/dL (ref 6–20)
CO2: 21 mmol/L — ABNORMAL LOW (ref 22–32)
CREATININE: 1.2 mg/dL (ref 0.61–1.24)
Calcium: 8.2 mg/dL — ABNORMAL LOW (ref 8.9–10.3)
Chloride: 102 mmol/L (ref 101–111)
GFR calc Af Amer: >60 mL/min (ref >60)
GFR calc non Af Amer: >60 mL/min (ref >60)
Glucose, Bld: 88 mg/dL (ref 65–99)
POTASSIUM: 3.3 mmol/L — AB (ref 3.5–5.1)
Sodium: 135 mmol/L (ref 135–145)
TOTAL PROTEIN: 6.5 g/dL (ref 6.5–8.1)

## 2018-02-09 LAB — CBC
HEMATOCRIT: 32.1 % — AB (ref 39.0–52.0)
HEMATOCRIT: 32.7 % — AB (ref 39.0–52.0)
HEMOGLOBIN: 10.8 g/dL — AB (ref 13.0–17.0)
Hemoglobin: 11.4 g/dL — ABNORMAL LOW (ref 13.0–17.0)
MCH: 22.8 pg — ABNORMAL LOW (ref 26.0–34.0)
MCH: 22.1 pg — ABNORMAL LOW (ref 26.0–34.0)
MCHC: 34.9 g/dL (ref 30.0–36.0)
MCHC: 33.6 g/dL (ref 30.0–36.0)
MCV: 65.8 fL — ABNORMAL LOW (ref 78.0–100.0)
MCV: 65.3 fL — AB (ref 78.0–100.0)
PLATELETS: 372 10*3/uL (ref 150–400)
Platelets: 327 10*3/uL (ref 150–400)
RBC: 4.88 MIL/uL (ref 4.22–5.81)
RBC: 5.01 MIL/uL (ref 4.22–5.81)
RDW: 15.6 % — ABNORMAL HIGH (ref 11.5–15.5)
RDW: 15.4 % (ref 11.5–15.5)
WBC: 11.3 10*3/uL — AB (ref 4.0–10.5)
WBC: 8.8 10*3/uL (ref 4.0–10.5)

## 2018-02-09 LAB — CREATININE, SERUM
Creatinine, Ser: 1.22 mg/dL (ref 0.61–1.24)
GFR calc non Af Amer: >60 mL/min (ref >60)

## 2018-02-09 LAB — ECHOCARDIOGRAM COMPLETE
Height: 63 in
Weight: 2169.33 oz

## 2018-02-09 MED ORDER — ASPIRIN 81 MG PO CHEW: 81.0000 mg | CHEWABLE_TABLET | Freq: Every day | ORAL | 2 refills | Status: DC

## 2018-02-09 MED ORDER — POTASSIUM CHLORIDE CRYS ER 20 MEQ PO TBCR
40.0000 meq | EXTENDED_RELEASE_TABLET | Freq: Two times a day (BID) | ORAL | Status: DC
  Administered 2018-02-09: 40 meq via ORAL
  Filled 2018-02-09: qty 2

## 2018-02-09 MED ORDER — METOPROLOL TARTRATE 25 MG PO TABS: 12.5000 mg | ORAL_TABLET | Freq: Two times a day (BID) | ORAL | 0 refills | Status: DC

## 2018-02-09 MED ORDER — REGADENOSON 0.4 MG/5ML IV SOLN
INTRAVENOUS | Status: AC
  Administered 2018-02-09: 0.4 mg via INTRAVENOUS
  Filled 2018-02-09: qty 5

## 2018-02-09 MED ORDER — PANTOPRAZOLE SODIUM 40 MG PO TBEC: 40.0000 mg | DELAYED_RELEASE_TABLET | Freq: Every day | ORAL | 0 refills | Status: DC

## 2018-02-09 MED ORDER — TECHNETIUM TC 99M TETROFOSMIN IV KIT
10.0000 | PACK | Freq: Once | INTRAVENOUS | Status: AC | PRN
  Administered 2018-02-09: 10 via INTRAVENOUS

## 2018-02-09 MED ORDER — TECHNETIUM TC 99M TETROFOSMIN IV KIT
30.0000 | PACK | Freq: Once | INTRAVENOUS | Status: AC | PRN
  Administered 2018-02-09: 30 via INTRAVENOUS

## 2018-02-09 MED ORDER — REGADENOSON 0.4 MG/5ML IV SOLN
0.4000 mg | Freq: Once | INTRAVENOUS | Status: AC
Start: 2018-02-09 — End: 2018-02-09
  Administered 2018-02-09: 0.4 mg via INTRAVENOUS

## 2018-02-09 MED ORDER — LISINOPRIL 10 MG PO TABS: 10.0000 mg | ORAL_TABLET | Freq: Every day | ORAL | 2 refills | Status: DC

## 2018-02-09 NOTE — Discharge Summary (Addendum)
Name: Eric Ingram MRN: [id number] DOB: 1961/07/11 57 y.o. PCP: Lorella Nimrod, MD  Date of Admission: 02/08/2018  1:05 PM Date of Discharge: 02/09/18 Attending Physician: Oval Linsey, MD  Discharge Diagnosis: 1.  Active Problems:   Chest pain   Discharge Medications: Allergies as of 02/09/2018   No Known Allergies     Medication List    STOP taking these medications   amLODipine 10 MG tablet Commonly known as:  NORVASC     TAKE these medications   lisinopril 10 MG tablet Commonly known as:  PRINIVIL,ZESTRIL Take 1 tablet (10 mg total) by mouth daily. Start taking on:  02/10/2018   metoprolol tartrate 25 MG tablet Commonly known as:  LOPRESSOR Take 0.5 tablets (12.5 mg total) by mouth 2 (two) times daily.   pantoprazole 40 MG tablet Commonly known as:  PROTONIX Take 1 tablet (40 mg total) by mouth daily. Start taking on:  02/10/2018       Disposition and follow-up:   [name] was discharged from [facility] in good condition.  At the hospital follow up visit please address:  Chest pan HFpEF HTN  -check volume status, assess need for further diuretic therapy -see how pt tolerating lisinopril and metoprolol adjust as needed based on bp -review negative myoview study and results of ECHO with patient  GERD Iron deficiency anemia  -likely 2/2 GI blood loss with concern for PUD vs esophageal/gastric adenocarcinoma -has GI appointment will get EGD and colonoscopy  2.  Labs / imaging needed at time of follow-up: cbc, bmp  3.  Pending labs/ test needing follow-up: myoview, ECHO, EGD, Colonoscopy  Follow-up Appointments: Follow-up Information    Lorella Nimrod, MD. Go on 02/16/2018.   Specialty:  Internal Medicine Why:  At 1:45PM Contact information: [place] [address] [place] Hospital Course by problem list: Active Problems:   Chest pain   Chest pan HFpEF HTN  Patient arrived hypertensive  as he is chronically,with atypical chest pain, he was found to be mildly fluid overloaded and his EKG was suggestive of left ventricular hypertrophy.  He had an extensive ACS workup which was negative including 6- troponins, and a Myoview nuclear stress test that was negative.  He had no shortness of breath but did have increased JVD and lower extremity edema.  He was given a one-time dose of Lasix 40 mg IV and had good urine output from this and resolution of lower extremity edema.  Ultimately it was felt that the patient has developed diastolic heart failure due to years of uncontrolled hypertension.  As he was only slightly volume overloaded the major goal was getting good control of the patient's blood pressure.  His volume status will be reevaluated in the outpatient setting and a decision will then be made low-dose oral diuretic therapy is necessary or not.  GERD Iron deficiency anemia Additionally the patient was found to be mildly anemic with an iron deficiency anemia.  With his GERD symptoms there has been's concern for quite a while about GI blood loss likely upper.  Several attempts have been made to get the patient appointment with GI for EGD and colonoscopy by his outpatient physician.  Having no insurance, compliance, and language barrier has been an issue.  luckily we were able to expedite the patient's cardiac workup as he did have an appointment with a GI doctor at [place] the following day.  Discharge Vitals:   BP 109/66 (BP Location: Left Arm)   Pulse 80   Temp 98 F (36.7 C) (Oral)   Resp 18   Ht 5\' 3"  (1.6 m)   Wt 135 lb 9.3 oz (61.5 kg)   SpO2 95%   BMI 24.02 kg/m   Pertinent Labs, Studies, and Procedures:  CBC Latest Ref Rng & Units 02/09/2018 02/09/2018 02/08/2018  WBC 4.0 - 10.5 K/uL 8.8 11.3(H) 12.1(H)  Hemoglobin 13.0 - 17.0 g/dL 10.8(L) 11.4(L) 11.7(L)  Hematocrit 39.0 - 52.0 % 32.1(L) 32.7(L) 33.6(L)  Platelets 150 - 400 K/uL 327 372 391   BMP Latest Ref Rng  & Units 02/09/2018 02/09/2018 02/08/2018  Glucose 65 - 99 mg/dL 88 - 111(H)  BUN 6 - 20 mg/dL 13 - 16  Creatinine 0.61 - 1.24 mg/dL 1.20 1.22 1.25(H)  Sodium 135 - 145 mmol/L 135 - 133(L)  Potassium 3.5 - 5.1 mmol/L 3.3(L) - 3.0(L)  Chloride 101 - 111 mmol/L 102 - 101  CO2 22 - 32 mmol/L 21(L) - 18(L)  Calcium 8.9 - 10.3 mg/dL 8.2(L) - 8.6(L)               *[facility]*                         [address], [place] [address]                            [phone]   ------------------------------------------------------------------- Transthoracic Echocardiography   Patient:    Eric Ingram, Eric Ingram MR #:       [id number] Study Date: 02/09/2018 Gender:     M Age:        35 Height:     160 cm Weight:     61.5 kg BSA:        1.66 m^2 Pt. Status: Room:       6E23C    Malvin Johns, MD  [place], MD  [facility] [place], [place]  PERFORMING   [facility], Inpatient  REFERRING    Lorella Nimrod  SONOGRAPHER  Cardell Peach, RDCS   cc:   ------------------------------------------------------------------- LV EF: 60% -   65%   ------------------------------------------------------------------- History:   PMH:  chest pain   Risk factors:  Hypertension.   ------------------------------------------------------------------- Study Conclusions   - Left ventricle: The cavity size was normal. There was mild focal   basal hypertrophy of the septum. Systolic function was normal.   The estimated ejection fraction was in the range of 60% to 65%.   Wall motion was normal; there were no regional wall motion   abnormalities. Doppler parameters are consistent with abnormal   left ventricular relaxation (grade 1 diastolic dysfunction).   Doppler parameters are consistent with high ventricular filling   pressure. - Aortic valve: Transvalvular velocity was within  the normal range.   There was no stenosis. There was trivial regurgitation. - Mitral valve: Transvalvular velocity was within the normal range.   There was no evidence for stenosis. There was trivial   regurgitation. Valve area by  pressure half-time: 2.24 cm^2. - Right ventricle: The cavity size was normal. Wall thickness was   normal. Systolic function was normal. - Atrial septum: No defect or patent foramen ovale was identified   by color flow Doppler. - Tricuspid valve: There was trivial regurgitation. - Pulmonary arteries: Systolic pressure was within the normal   range. PA peak pressure: 21 mm Hg (S). - Pericardium, extracardiac: A trivial pericardial effusion was   identified.   ------------------------------------------------------------------- Study data:  No prior study was available for comparison.  Study status:  Routine.  Procedure:  Transthoracic echocardiography. Image quality was adequate.  Study completion:  There were no complications.          Transthoracic echocardiography.  M-mode, complete 2D, spectral Doppler, and color Doppler.  Birthdate: Patient birthdate: 1961-05-11.  Age:  Patient is 57 yr old.  Sex: Gender: male.    BMI: 24 kg/m^2.  Blood pressure:     105/70 Patient status:  Inpatient.  Study date:  Study date: 02/09/2018. Study time: 01:11 PM.  Location:  Bedside.   -------------------------------------------------------------------   ------------------------------------------------------------------- Left ventricle:  The cavity size was normal. There was mild focal basal hypertrophy of the septum. Systolic function was normal. The estimated ejection fraction was in the range of 60% to 65%. Wall motion was normal; there were no regional wall motion abnormalities. Doppler parameters are consistent with abnormal left ventricular relaxation (grade 1 diastolic dysfunction). Doppler parameters are consistent with high ventricular filling pressure.     ------------------------------------------------------------------- Aortic valve:   Trileaflet; mildly thickened, mildly calcified leaflets. Mobility was not restricted.  Doppler:  Transvalvular velocity was within the normal range. There was no stenosis. There was trivial regurgitation.   ------------------------------------------------------------------- Aorta:  Aortic root: The aortic root was normal in size.   ------------------------------------------------------------------- Mitral valve:   Structurally normal valve.   Mobility was not restricted.  Doppler:  Transvalvular velocity was within the normal range. There was no evidence for stenosis. There was trivial regurgitation.    Valve area by pressure half-time: 2.24 cm^2. Indexed valve area by pressure half-time: 1.35 cm^2/m^2.    Peak gradient (D): 4 mm Hg.   ------------------------------------------------------------------- Left atrium:  The atrium was normal in size.   ------------------------------------------------------------------- Atrial septum:  No defect or patent foramen ovale was identified by color flow Doppler.   ------------------------------------------------------------------- Right ventricle:  The cavity size was normal. Wall thickness was normal. Systolic function was normal.   ------------------------------------------------------------------- Pulmonic valve:    Structurally normal valve.   Cusp separation was normal.  Doppler:  Transvalvular velocity was within the normal range. There was no evidence for stenosis. There was trivial regurgitation.   ------------------------------------------------------------------- Tricuspid valve:   Structurally normal valve.    Doppler: Transvalvular velocity was within the normal range. There was trivial regurgitation.   ------------------------------------------------------------------- Pulmonary artery:   The main pulmonary artery was  normal-sized. Systolic pressure was within the normal range.   ------------------------------------------------------------------- Right atrium:  The atrium was normal in size.   ------------------------------------------------------------------- Pericardium:  A trivial pericardial effusion was identified.   ------------------------------------------------------------------- Systemic veins: Inferior vena cava: The vessel was normal in size. The respirophasic diameter changes were in the normal range (>= 50%), consistent with normal central venous pressure.   ------------------------------------------------------------------- Measurements    Left ventricle                           Value           Reference  LV ID, ED, PLAX chordal                  47     mm       43 - 52  LV ID, ES, PLAX chordal          (L)     22     mm       23 - 38  LV fx shortening, PLAX chordal           53     %        >=29  LV PW thickness, ED                      9      mm       ---------  IVS/LV PW ratio, ED                      1.22            <=1.3  Stroke volume, 2D                        49     ml       ---------  Stroke volume/bsa, 2D                    29     ml/m^2   ---------  LV e&', lateral                           12.9   cm/s     ---------  LV E/e&', lateral                         7.98            ---------  LV e&', medial                            6.42   cm/s     ---------  LV E/e&', medial                          16.04           ---------  LV e&', average                           9.66   cm/s     ---------  LV E/e&', average                         10.66           ---------    Ventricular septum                       Value           Reference  IVS thickness, ED                        11     mm       ---------    LVOT  Value           Reference  LVOT ID, S                               17     mm       ---------  LVOT area                                 2.27   cm^2     ---------  LVOT peak velocity, S                    128    cm/s     ---------  LVOT mean velocity, S                    79     cm/s     ---------  LVOT VTI, S                              21.6   cm       ---------  LVOT peak gradient, S                    7      mm Hg    ---------    Aorta                                    Value           Reference  Aortic root ID, ED                       26     mm       ---------    Left atrium                              Value           Reference  LA ID, A-P, ES                           33     mm       ---------  LA ID/bsa, A-P                           1.99   cm/m^2   <=2.2  LA volume, S                             39     ml       ---------  LA volume/bsa, S                         23.5   ml/m^2   ---------  LA volume, ES, 1-p A4C                   35.6   ml       ---------  LA volume/bsa, ES, 1-p A4C  21.4   ml/m^2   ---------  LA volume, ES, 1-p A2C                   41.4   ml       ---------  LA volume/bsa, ES, 1-p A2C               24.9   ml/m^2   ---------    Mitral valve                             Value           Reference  Mitral E-wave peak velocity              103    cm/s     ---------  Mitral deceleration time                 183    ms       150 - 230  Mitral pressure half-time                54     ms       ---------  Mitral peak gradient, D                  4      mm Hg    ---------  Mitral valve area, PHT, DP               2.24   cm^2     ---------  Mitral valve area/bsa, PHT, DP           1.35   cm^2/m^2 ---------    Pulmonary arteries                       Value           Reference  PA pressure, S, DP                       21     mm Hg    <=30    Tricuspid valve                          Value           Reference  Tricuspid regurg peak velocity           212.01 cm/s     ---------  Tricuspid peak RV-RA gradient            18     mm Hg    ---------  Tricuspid maximal regurg                 212.01 cm/s      ---------  velocity, PISA    Systemic veins                           Value           Reference  Estimated CVP                            3      mm Hg    ---------    Right ventricle  Value           Reference  RV ID, minor axis, ED, A4C base          36     mm       ---------  TAPSE                                    21.2   mm       ---------  RV pressure, S, DP                       21     mm Hg    <=30  RV s&', lateral, S                        17.9   cm/s     ---------   Legend: (L)  and  (H)  mark values outside specified reference range.   ------------------------------------------------------------------- Prepared and Electronically Authenticated by   Skeet Latch, MD 2019-02-27T16:16:09   CLINICAL DATA:  57 year old male with a history of chest pain.   Cardiovascular risk factors include hypertension   EXAM: MYOCARDIAL IMAGING WITH SPECT (REST AND PHARMACOLOGIC-STRESS)   GATED LEFT VENTRICULAR WALL MOTION STUDY   LEFT VENTRICULAR EJECTION FRACTION   TECHNIQUE: Standard myocardial SPECT imaging was performed after resting intravenous injection of 10 mCi Tc-14m tetrofosmin. Subsequently, intravenous infusion of Lexiscan was performed under the supervision of the Cardiology staff. At peak effect of the drug, 30 mCi Tc-63m tetrofosmin was injected intravenously and standard myocardial SPECT imaging was performed. Quantitative gated imaging was also performed to evaluate left ventricular wall motion, and estimate left ventricular ejection fraction.   COMPARISON:  None.   FINDINGS: Perfusion: No decreased activity in the left ventricle on stress imaging to suggest reversible ischemia or infarction.   Wall Motion: Normal left ventricular wall motion. No left ventricular dilation.   Left Ventricular Ejection Fraction: 69 %   End diastolic volume 76 ml   End systolic volume 23 ml   IMPRESSION: 1. No reversible ischemia or  infarction.   2. Normal left ventricular wall motion.   3. Left ventricular ejection fraction 69%   4. Non invasive risk stratification*: Low   *2012 Appropriate Use Criteria for Coronary Revascularization Focused Update: J Am Coll Cardiol. 3818;29(9):371-696. http://content.airportbarriers.com.aspx?articleid=1201161   CT angio  CLINICAL DATA:  Hemoptysis yesterday. Shortness of breath. Evaluate for pulmonary embolism.   EXAM: CT ANGIOGRAPHY CHEST WITH CONTRAST   TECHNIQUE: Multidetector CT imaging of the chest was performed using the standard protocol during bolus administration of intravenous contrast. Multiplanar CT image reconstructions and MIPs were obtained to evaluate the vascular anatomy.   CONTRAST:  160mL ISOVUE-370 IOPAMIDOL (ISOVUE-370) INJECTION 76%   COMPARISON:  08/25/2017   FINDINGS: Cardiovascular: Satisfactory opacification of the pulmonary arteries to the segmental level. No evidence of pulmonary embolism. Normal heart size. No pericardial effusion.   Mediastinum/Nodes: Negative for adenopathy or mass.   Lungs/Pleura: Small pleural effusions, right greater than left, with mild atelectasis. Centrally there is fissure thickening and on coronal reformats there is occasional interlobular septal thickening. 2-3 mm right upper lobe pulmonary nodule, below size threshold for imaging follow-up in this patient listed as never smoker.   Upper Abdomen: Negative   Musculoskeletal: Spondylosis.  No acute or aggressive finding.   Review of the MIP images confirms the above findings.  IMPRESSION: 1. Negative for pulmonary embolism. 2. Small pleural effusions and trace edema. 3. Mild atelectasis in the lower lobes.     Electronically Signed   By: Monte Fantasia M.D.   On: 02/08/2018 15:41   Discharge Instructions: Discharge Instructions    Diet - low sodium heart healthy   Complete by:  As directed    Discharge instructions   Complete by:  As  directed    It was pleasure taking care of you. We are making some changes to your medication, stop taking your amlodipine. Now you will take lisinopril 10 mg daily, and metoprolol 12.5 mg twice a day. Sometimes lisinopril can cause cough or swelling of your face or tongue, if that happen please stop taking that medicine and let us know.  We will assess your fluid status during your appointment on Wednesday and see if you need further medications.  Please follow-up in the clinic on February 16, 2018 at 1:45 PM. Please follow-up with your stomach doctor tomorrow according to your appointment.   Increase activity slowly   Complete by:  As directed       Signed: Katherine Roan, MD 02/09/2018, 4:15 PM

## 2018-02-09 NOTE — Addendum Note (Signed)
Addended by: Lalla Brothers T on: 02/09/2018 08:46 AM   Modules accepted: Level of Service

## 2018-02-09 NOTE — Progress Notes (Signed)
Internal Medicine Clinic Attending  I saw and evaluated the patient.  I personally confirmed the key portions of the history and exam documented by Dr. Hetty Ely and I reviewed pertinent patient test results.  The assessment, diagnosis, and plan were formulated together and I agree with the documentation in the resident's note.  57 year old [place] speaking man presenting with new chest pain and feeling generally unwell. He told me this was present for only 1-2 days, and is different from the abdominal pain that took him to the ED last week. On exam he was ill appearing, diaphoretic, tachycardic. He had jugular vein pulsations to the level of the mandible, his apical pulse was not displaced. He had no murmurs or extra heart sounds and equal pulses in his extremities. I reviewed his ECG which had new TWI in V4-6 and was sinus tachycardia with signs of LVH. I think his symptoms and exam are consistent with a new acute heart failure with mild volume overload today. I agree with Dr. Fredrik Cove plan to send him to the ED for serial cardiac enzymes and PE rule out, and greatly appreciate IMTS admission for careful diuresis and further investigation.

## 2018-02-09 NOTE — Telephone Encounter (Signed)
Hospital follow-up per Dr.Amin; pt appt 03/06 145pm/ [place]

## 2018-02-09 NOTE — Progress Notes (Addendum)
Subjective: Eric Ingram has reported no chest pain overnight or this morning.  He has noticed decreased swelling in his bilateral lower extremities.  Objective:  Vital signs in last 24 hours: Vitals:   02/08/18 1930 02/08/18 1945 02/08/18 2053 02/09/18 0523  BP: 130/84 127/80 133/81 103/61  Pulse: (!) 102 (!) 102 (!) 106 84  Resp: (!) 25 (!) 23  18  Temp:   98.4 F (36.9 C) 98.3 F (36.8 C)  TempSrc:   Oral Oral  SpO2: 95% 94% 97% 95%  Weight:   136 lb 6.4 oz (61.9 kg) 135 lb 9.3 oz (61.5 kg)  Height:   5\' 3"  (1.6 m)    Physical Exam  Constitutional: He is oriented to person, place, and time. He appears well-developed and well-nourished.  Eyes: Right eye exhibits no discharge. Left eye exhibits no discharge. No scleral icterus.  Neck: JVD (2cm above clavicle) present.  Cardiovascular: Regular rhythm, normal heart sounds and intact distal pulses. Tachycardia present. PMI is displaced (displaced to right). Exam reveals no gallop and no friction rub.  No murmur heard. Pulmonary/Chest: Effort normal. No respiratory distress. He has no wheezes. He has no rales today which is an improvement from yesterday Abdominal: Soft. Bowel sounds are normal. He exhibits no distension and no mass. There is no tenderness. There is no guarding.  Musculoskeletal: He exhibits edema (bilateral LE trace-1+) interval improvement from yesterday.  Neurological: He is alert and oriented to person, place, and time.    Intake/Output Summary (Last 24 hours) at 02/09/2018 1600 Last data filed at 02/09/2018 1541 Gross per 24 hour  Intake 240 ml  Output 1200 ml  Net -960 ml    Assessment/Plan:  Active Problems:   Chest pain  Chest pain Symptoms suggestive of GERD, also a cough and pleuritic pain suspicious for worsening heart failure  -no further symptoms since admission  -cardiology consulted -ECHO ordered result pending -myoview ordered results pending -Troponins neg x6 -repeat EKG unchanged from  admission -good urine output overnight with improvement in physical exam, some output not documented -will discharge metoprolol tartrate 12.5mg  BID, lisinopril 10mg  daily, will assess fluid status after good bp control next week in our clinic along with reviewing with the patient his ECHO and myoview results     GERD Pt with symptoms concerning for GERD, currently has been taking no medications to help with this   -started pantoprazole 40mg    HTN Chronic essential   -lisinopril 10mg  -metoprolol tartrate 12.5mg  BID   Iron deficiency anemia Suspected GI blood loss chronic, Has not followed up with GI as directed   -has appointment with GI tomorrow morning, it is imperative that he make this appointment, and we discussed this in length today and he does not have any barriers to making it. -monitor CBC -FOBT   Dispo: Anticipated discharge this afternoon.   Eric Roan, MD 02/09/2018, 10:55 AM Eric Muff MD PGY-1 Internal Medicine Pager # [phone]  Internal Medicine Attending  Date: 02/09/2018  Patient name: Eric Ingram Medical record number: [id number] Date of birth: [birth year] Age: 57 y.o. Gender: male  I saw and evaluated the patient. I reviewed the resident's note by Dr. Shan Ingram and I agree with the resident's findings and plans as documented in his progress note.  Please see my H&P dated 02/09/2018 and attached to Dr. Maudie Ingram H&P dated 02/08/2018 for the specifics of my evaluation, assessment, and plan from earlier in the day. Of note, the nuclear perfusion scan was unremarkable with no  evidence of reversible ischemia. The echocardiogram was also unremarkable with the exception of grade 1 diastolic dysfunction. This likely explains the mild volume overload seen on examination. Management of his hypertension will be important to control the progression of his diastolic dysfunction.

## 2018-02-09 NOTE — Progress Notes (Signed)
  Echocardiogram 2D Echocardiogram has been performed.  [name] 02/09/2018, 1:36 PM

## 2018-02-10 ENCOUNTER — Ambulatory Visit (INDEPENDENT_AMBULATORY_CARE_PROVIDER_SITE_OTHER): Payer: Self-pay | Admitting: Physician Assistant

## 2018-02-10 ENCOUNTER — Encounter: Payer: Self-pay | Admitting: Physician Assistant

## 2018-02-10 VITALS — BP 110/70 | HR 96 | Ht 63.0 in | Wt 132.6 lb

## 2018-02-10 DIAGNOSIS — K219 Gastro-esophageal reflux disease without esophagitis: Secondary | ICD-10-CM

## 2018-02-10 DIAGNOSIS — D509 Iron deficiency anemia, unspecified: Secondary | ICD-10-CM

## 2018-02-10 DIAGNOSIS — R140 Abdominal distension (gaseous): Secondary | ICD-10-CM

## 2018-02-10 NOTE — Progress Notes (Signed)
Chief Complaint: Microcytic anemia, bloating, GERD  HPI:    Eric Ingram is a 57 year old male who presents to clinic today with an interpreter, who was referred to me by Lorella Nimrod, MD for a complaint of new microcytic anemia, bloating and reflux.      Patient seen in the hospital 02/08/18-02/09/18 for workup of chest pain.  Cardiac origin ruled out.  Finding of a new microcytic anemia.  CBC 12/08/18 with a hemoglobin 11.7 and MCV low at 65.4.  BMP with minimally elevated creatinine at 1.25.    Today, presents to clinic with wife, history is hard to obtain.  Very minimal answers to questioning.  Describes off and on gas and bloating for 4 years as well as a generalized abdominal discomfort at times.  Also with increased gas and bloating anytime that he eats and early satiety.  Almost daily reflux symptoms.  Does battle with constipation per him, but had a normal bowel movement this morning and " feels better".  Has not picked up his medications including Pantoprazole yet after recent hospital stay.    Denies fever, chills, nausea, vomiting, weight loss, change in bowel habits, blood in his stool or melena.  Past Medical History:  Diagnosis Date  . Hypertension     Past Surgical History:  Procedure Laterality Date  . NO PAST SURGERIES      Current Outpatient Medications  Medication Sig Dispense Refill  . lisinopril (PRINIVIL,ZESTRIL) 10 MG tablet Take 1 tablet (10 mg total) by mouth daily. (Patient not taking: Reported on 02/10/2018) 30 tablet 2  . metoprolol tartrate (LOPRESSOR) 25 MG tablet Take 0.5 tablets (12.5 mg total) by mouth 2 (two) times daily. (Patient not taking: Reported on 02/10/2018) 30 tablet 0  . pantoprazole (PROTONIX) 40 MG tablet Take 1 tablet (40 mg total) by mouth daily. (Patient not taking: Reported on 02/10/2018) 30 tablet 0   No current facility-administered medications for this visit.     Allergies as of 02/10/2018  . (No Known Allergies)    Family History    Family history unknown: Yes    Social History   Socioeconomic History  . Marital status: Married    Spouse name: Not on file  . Number of children: Not on file  . Years of education: Not on file  . Highest education level: Not on file  Social Needs  . Financial resource strain: Not on file  . Food insecurity - worry: Not on file  . Food insecurity - inability: Not on file  . Transportation needs - medical: Not on file  . Transportation needs - non-medical: Not on file  Occupational History  . Not on file  Tobacco Use  . Smoking status: Former Research scientist (life sciences)  . Smokeless tobacco: Never Used  Substance and Sexual Activity  . Alcohol use: No    Frequency: Never    Comment: former  . Drug use: No  . Sexual activity: Not on file  Other Topics Concern  . Not on file  Social History Narrative  . Not on file    Review of Systems:    Constitutional: No weight loss, fever or chills Skin: No rash  Cardiovascular: + chest pain Respiratory: No SOB  Gastrointestinal: See HPI and otherwise negative Genitourinary: No dysuria  Neurological: No headache, dizziness or syncope Musculoskeletal: No new muscle or joint pain Hematologic: No bleeding  Psychiatric: No history of depression or anxiety   Physical Exam:  Vital signs: BP 110/70   Pulse 96  Ht 5\' 3"  (1.6 m)   Wt 132 lb 9.6 oz (60.1 kg)   BMI 23.49 kg/m    Constitutional:   Pleasant male appears to be in NAD, Well developed, Well nourished, alert and cooperative Head:  Normocephalic and atraumatic. Eyes:   PEERL, EOMI. No icterus. Conjunctiva pink. Ears:  Normal auditory acuity. Neck:  Supple Throat: Oral cavity and pharynx without inflammation, swelling or lesion.  Respiratory: Respirations even and unlabored. Lungs clear to auscultation bilaterally.   No wheezes, crackles, or rhonchi.  Cardiovascular: Normal S1, S2. No MRG. Regular rate and rhythm. No peripheral edema, cyanosis or pallor.  Gastrointestinal:  Soft,  nondistended, nontender. No rebound or guarding. Normal bowel sounds. No appreciable masses or hepatomegaly. Rectal:  Not performed.  Msk:  Symmetrical without gross deformities. Without edema, no deformity or joint abnormality.  Neurologic:  Alert and  oriented x4;  grossly normal neurologically.  Skin:   Dry and intact without significant lesions or rashes. Psychiatric:  Demonstrates good judgement and reason without abnormal affect or behaviors.  RELEVANT LABS AND IMAGING: CBC    Component Value Date/Time   WBC 8.8 02/09/2018 0623   RBC 4.88 02/09/2018 0623   HGB 10.8 (L) 02/09/2018 0623   HCT 32.1 (L) 02/09/2018 0623   PLT 327 02/09/2018 0623   MCV 65.8 (L) 02/09/2018 0623   MCH 22.1 (L) 02/09/2018 0623   MCHC 33.6 02/09/2018 0623   RDW 15.4 02/09/2018 0623   LYMPHSABS 2.0 08/25/2017 1358   MONOABS 0.6 08/25/2017 1358   EOSABS 0.1 08/25/2017 1358   BASOSABS 0.0 08/25/2017 1358    CMP     Component Value Date/Time   NA 135 02/09/2018 0623   K 3.3 (L) 02/09/2018 0623   CL 102 02/09/2018 0623   CO2 21 (L) 02/09/2018 0623   GLUCOSE 88 02/09/2018 0623   BUN 13 02/09/2018 0623   CREATININE 1.20 02/09/2018 0623   CALCIUM 8.2 (L) 02/09/2018 0623   PROT 6.5 02/09/2018 0623   ALBUMIN 3.1 (L) 02/09/2018 0623   AST 31 02/09/2018 0623   ALT 15 (L) 02/09/2018 0623   ALKPHOS 70 02/09/2018 0623   BILITOT 1.0 02/09/2018 0623   GFRNONAA >60 02/09/2018 0623   GFRAA >60 02/09/2018 1540    Assessment: 1.  Microcytic anemia: Recent hemoglobin 10.8 on 02/09/18, low MCV, normal cardiac workup; consider GI source of blood loss 2.  Bloating: Off and on for 4 years per the patient, consider relation to report of constipation 3.  GERD: Daily times 4 years per the patient  Plan: 1.  Scheduled patient for EGD and colonoscopy with Dr. Havery Moros in the [facility]  Did discuss risk, benefits, limitations and alternatives and the patient agrees to proceed. 2.  Recommend patient pick up Pantoprazole  40 mg once daily from the pharmacy and start this medication. 3.  Patient await further recommendations after studies above.  Ellouise Newer, PA-C [facility] 02/10/2018, 11:15 AM  Cc: Lorella Nimrod, MD

## 2018-02-10 NOTE — Progress Notes (Signed)
Agree with assessment and plan as outlined.  

## 2018-02-10 NOTE — Patient Instructions (Signed)
You have been scheduled for an endoscopy and colonoscopy. Please follow the written instructions given to you at your visit today. Please pick up your prep supplies at the pharmacy within the next 1-3 days. If you use inhalers (even only as needed), please bring them with you on the day of your procedure. Your physician has requested that you go to www.startemmi.com and enter the access code given to you at your visit today. This web site gives a general overview about your procedure. However, you should still follow specific instructions given to you by our office regarding your preparation for the procedure.  Make sure to pick up medications at pharmacy that were prescribed to you while you were at the hospital.  If you are age 57 or older, your body mass index should be between 23-30. Your Body mass index is 23.49 kg/m. If this is out of the aforementioned range listed, please consider follow up with your Primary Care Provider.  If you are age 70 or younger, your body mass index should be between 19-25. Your Body mass index is 23.49 kg/m. If this is out of the aformentioned range listed, please consider follow up with your Primary Care Provider.

## 2018-02-11 ENCOUNTER — Encounter: Payer: Self-pay | Admitting: Gastroenterology

## 2018-02-11 ENCOUNTER — Other Ambulatory Visit: Payer: Self-pay

## 2018-02-11 ENCOUNTER — Ambulatory Visit (AMBULATORY_SURGERY_CENTER): Payer: Self-pay | Admitting: Gastroenterology

## 2018-02-11 VITALS — BP 117/68 | HR 73 | Temp 98.4°F | Resp 14 | Ht 63.0 in | Wt 132.0 lb

## 2018-02-11 DIAGNOSIS — K317 Polyp of stomach and duodenum: Secondary | ICD-10-CM

## 2018-02-11 DIAGNOSIS — K2900 Acute gastritis without bleeding: Secondary | ICD-10-CM

## 2018-02-11 DIAGNOSIS — D509 Iron deficiency anemia, unspecified: Secondary | ICD-10-CM

## 2018-02-11 DIAGNOSIS — D122 Benign neoplasm of ascending colon: Secondary | ICD-10-CM

## 2018-02-11 DIAGNOSIS — K219 Gastro-esophageal reflux disease without esophagitis: Secondary | ICD-10-CM

## 2018-02-11 DIAGNOSIS — K2950 Unspecified chronic gastritis without bleeding: Secondary | ICD-10-CM

## 2018-02-11 DIAGNOSIS — R140 Abdominal distension (gaseous): Secondary | ICD-10-CM

## 2018-02-11 MED ORDER — SODIUM CHLORIDE 0.9 % IV SOLN: 500.0000 mL | INTRAVENOUS | Status: DC

## 2018-02-11 NOTE — Op Note (Signed)
[facility] Patient Name: Eric Ingram Procedure Date: 02/11/2018 1:14 PM MRN: [id number] Endoscopist: Remo Lipps P. Duncan Alejandro MD, MD Age: 57 Referring MD:  Date of Birth: 10-02-1961 Gender: Male Account #: [id number] Procedure:                Upper GI endoscopy Indications:              Iron deficiency anemia (microcytic anemia with low                            ferritin noted 2018) Medicines:                Monitored Anesthesia Care Procedure:                Pre-Anesthesia Assessment:                           - Prior to the procedure, a History and Physical                            was performed, and patient medications and                            allergies were reviewed. The patient's tolerance of                            previous anesthesia was also reviewed. The risks                            and benefits of the procedure and the sedation                            options and risks were discussed with the patient.                            All questions were answered, and informed consent                            was obtained. Prior Anticoagulants: The patient has                            taken no previous anticoagulant or antiplatelet                            agents. ASA Grade Assessment: II - A patient with                            mild systemic disease. After reviewing the risks                            and benefits, the patient was deemed in                            satisfactory condition to undergo the procedure.  After obtaining informed consent, the endoscope was                            passed under direct vision. Throughout the                            procedure, the patient's blood pressure, pulse, and                            oxygen saturations were monitored continuously. The                            Endoscope was introduced through the mouth, and                            advanced to the second part of  duodenum. The upper                            GI endoscopy was accomplished without difficulty.                            The patient tolerated the procedure well. Scope In: Scope Out: Findings:                 Esophagogastric landmarks were identified: the                            Z-line was found at 35 cm, the gastroesophageal                            junction was found at 35 cm and the upper extent of                            the gastric folds was found at 35 cm from the                            incisors.                           A diverticulum with a small opening was found in                            the upper third of the esophagus.                           The exam of the esophagus was otherwise normal.                           Patchy moderate inflammation characterized by                            multiple erosions, erythema, friability and  granularity was found in the gastric antrum.                            Biopsies were taken from the antrum, body, and                            incisura with a cold forceps for Helicobacter                            pylori testing.                           A single 5 mm sessile polyp was found in the                            gastric antrum. Biopsies were taken with a cold                            forceps for histology.                           The exam of the stomach was otherwise normal.                           The duodenal bulb and second portion of the                            duodenum were normal. Complications:            No immediate complications. Estimated blood loss:                            Minimal. Estimated Blood Loss:     Estimated blood loss was minimal. Impression:               - Esophagogastric landmarks identified.                           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                           - Normal esophagus otherwise                            - Erosive gastritis. Biopsied to rule out H pylori.                           - A single gastric polyp. Biopsied.                           - Normal duodenal bulb and second portion of the                            duodenum.  It is possible gastritis is the cause of iron                            deficiency. Will rule out H pylori. Recommendation:           - Patient has a contact number available for                            emergencies. The signs and symptoms of potential                            delayed complications were discussed with the                            patient. Return to normal activities tomorrow.                            Written discharge instructions were provided to the                            patient.                           - Resume previous diet.                           - Continue present medications.                           - Please ensure you take 40mg  daily as prescribed                            recently                           - Await pathology results.                           - No aspirin, ibuprofen, naproxen, or other                            non-steroidal anti-inflammatory drugs.                           - Start ferrous sulfate 325mg  twice daily Eric Ingram P. Eric Therien MD, MD 02/11/2018 2:05:23 PM This report has been signed electronically.

## 2018-02-11 NOTE — Progress Notes (Signed)
Called to room to assist during endoscopic procedure.  Patient ID and intended procedure confirmed with present staff. Received instructions for my participation in the procedure from the performing physician.  

## 2018-02-11 NOTE — Op Note (Signed)
[facility] Patient Name: Eric Ingram Procedure Date: 02/11/2018 1:14 PM MRN: [id number] Endoscopist: Remo Lipps P. Armbruster MD, MD Age: 57 Referring MD:  Date of Birth: 08-Nov-1961 Gender: Male Account #: [id number] Procedure:                Colonoscopy Indications:              Unexplained iron deficiency anemia Medicines:                Monitored Anesthesia Care Procedure:                Pre-Anesthesia Assessment:                           - Prior to the procedure, a History and Physical                            was performed, and patient medications and                            allergies were reviewed. The patient's tolerance of                            previous anesthesia was also reviewed. The risks                            and benefits of the procedure and the sedation                            options and risks were discussed with the patient.                            All questions were answered, and informed consent                            was obtained. Prior Anticoagulants: The patient has                            taken no previous anticoagulant or antiplatelet                            agents. ASA Grade Assessment: II - A patient with                            mild systemic disease. After reviewing the risks                            and benefits, the patient was deemed in                            satisfactory condition to undergo the procedure.                           After obtaining informed consent, the colonoscope  was passed under direct vision. Throughout the                            procedure, the patient's blood pressure, pulse, and                            oxygen saturations were monitored continuously. The                            Model PCF-H190DL [id number]) scope was introduced                            through the anus and advanced to the the terminal                            ileum, with  identification of the appendiceal                            orifice and IC valve. The colonoscopy was performed                            without difficulty. The patient tolerated the                            procedure well. The quality of the bowel                            preparation was adequate. The terminal ileum,                            ileocecal valve, appendiceal orifice, and rectum                            were photographed. Scope In: 1:36:58 PM Scope Out: 1:54:13 PM Scope Withdrawal Time: 0 hours 15 minutes 3 seconds  Total Procedure Duration: 0 hours 17 minutes 15 seconds  Findings:                 The perianal and digital rectal examinations were                            normal.                           The terminal ileum appeared normal.                           A 8 mm polyp was found in the ascending colon. The                            polyp was sessile. The polyp was removed with a                            cold snare. Resection and retrieval were complete.  A few medium-mouthed diverticula were found in the                            transverse colon.                           Anal papilla(e) were hypertrophied.                           Internal hemorrhoids were found during retroflexion.                           The exam was otherwise without abnormality. Complications:            No immediate complications. Estimated blood loss:                            Minimal. Estimated Blood Loss:     Estimated blood loss was minimal. Impression:               - The examined portion of the ileum was normal.                           - One 8 mm polyp in the ascending colon, removed                            with a cold snare. Resected and retrieved.                           - Diverticulosis in the transverse colon.                           - Anal papilla(e) were hypertrophied.                           - Internal hemorrhoids.                            - The examination was otherwise normal.                           No cause for iron deficiency on this exam. Recommendation:           - Patient has a contact number available for                            emergencies. The signs and symptoms of potential                            delayed complications were discussed with the                            patient. Return to normal activities tomorrow.                            Written discharge instructions were provided to the  patient.                           - Resume previous diet.                           - Continue present medications.                           - Await pathology results.                           - Repeat colonoscopy is recommended for                            surveillance. The colonoscopy date will be                            determined after pathology results from today's                            exam become available for review. Remo Lipps P. Armbruster MD, MD 02/11/2018 1:59:17 PM This report has been signed electronically.

## 2018-02-11 NOTE — Progress Notes (Signed)
To recovery, report to RN, VSS. 

## 2018-02-11 NOTE — Patient Instructions (Addendum)
YOU HAD AN ENDOSCOPIC PROCEDURE TODAY AT [facility]:   Refer to the procedure report that was given to you for any specific questions about what was found during the examination.  If the procedure report does not answer your questions, please call your gastroenterologist to clarify.  If you requested that your care partner not be given the details of your procedure findings, then the procedure report has been included in a sealed envelope for you to review at your convenience later.  YOU SHOULD EXPECT: Some feelings of bloating in the abdomen. Passage of more gas than usual.  Walking can help get rid of the air that was put into your GI tract during the procedure and reduce the bloating. If you had a lower endoscopy (such as a colonoscopy or flexible sigmoidoscopy) you may notice spotting of blood in your stool or on the toilet paper. If you underwent a bowel prep for your procedure, you may not have a normal bowel movement for a few days.  Please Note:  You might notice some irritation and congestion in your nose or some drainage.  This is from the oxygen used during your procedure.  There is no need for concern and it should clear up in a day or so.  SYMPTOMS TO REPORT IMMEDIATELY:   Following lower endoscopy (colonoscopy or flexible sigmoidoscopy):  Excessive amounts of blood in the stool  Significant tenderness or worsening of abdominal pains  Swelling of the abdomen that is new, acute  Fever of 100F or higher   Following upper endoscopy (EGD)  Vomiting of blood or coffee ground material  New chest pain or pain under the shoulder blades  Painful or persistently difficult swallowing  New shortness of breath  Fever of 100F or higher  Black, tarry-looking stools  Please see handouts given to you on Polyps, Hemorrhoids, Diverticulosis and Gastritis. Start taking Ferrous Sulfate 325mg  daily. You may purchase this over the counter. Continue to take Pantoprazole 40 mg daily  as prescribed. No NSADIS, ibuprofen, motrin or Aspirin products.  For urgent or emergent issues, a gastroenterologist can be reached at any hour by calling [phone].   DIET:  We do recommend a small meal at first, but then you may proceed to your regular diet.  Drink plenty of fluids but you should avoid alcoholic beverages for 24 hours.  ACTIVITY:  You should plan to take it easy for the rest of today and you should NOT DRIVE or use heavy machinery until tomorrow (because of the sedation medicines used during the test).    FOLLOW UP: Our staff will call the number listed on your records the next business day following your procedure to check on you and address any questions or concerns that you may have regarding the information given to you following your procedure. If we do not reach you, we will leave a message.  However, if you are feeling well and you are not experiencing any problems, there is no need to return our call.  We will assume that you have returned to your regular daily activities without incident.  If any biopsies were taken you will be contacted by phone or by letter within the next 1-3 weeks.  Please call us at [phone] if you have not heard about the biopsies in 3 weeks.    SIGNATURES/CONFIDENTIALITY: You and/or your care partner have signed paperwork which will be entered into your electronic medical record.  These signatures attest to the fact that that  the information above on your After Visit Summary has been reviewed and is understood.  Full responsibility of the confidentiality of this discharge information lies with you and/or your care-partner.  Thank you for letting us take care of your healthcare needs today.

## 2018-02-14 ENCOUNTER — Telehealth: Payer: Self-pay

## 2018-02-14 NOTE — Telephone Encounter (Signed)
  Follow up Call-  Call back number 02/11/2018  Post procedure Call Back phone  # [phone] ( daughter -Hmin)  Some recent data might be hidden     Patient questions:  Do you have a fever, pain , or abdominal swelling? No. Pain Score  0 *  Have you tolerated food without any problems? Yes.    Have you been able to return to your normal activities? Yes.    Do you have any questions about your discharge instructions: Diet   No. Medications  No. Follow up visit  No.  Do you have questions or concerns about your Care? No.  Actions: * If pain score is 4 or above: No action needed, pain <4.

## 2018-02-14 NOTE — Telephone Encounter (Signed)
Transition Care Management Follow-up Telephone Call   Date discharged?02/09/18   How have you been since you were released from the hospital? (Spoke to patient & a family member who translated) States " he's doing fine, no more chest pains"   Do you understand why you were in the hospital? yes   Do you understand the discharge instructions? yes   Where were you discharged to? home   Items Reviewed:  Medications reviewed:yes-states no problems with medications  Allergies reviewed: yes  Dietary changes reviewed: yes  Referrals reviewed: n/a   Functional Questionnaire:   Activities of Daily Living (ADLs):   He states they are independent in the following: with all ADLs States they require assistance with the following: none   Any transportation issues/concerns?: yes- family will drive patient to appt   Any patient concerns? denies   Confirmed importance and date/time of follow-up visits scheduled yes  Provider Appointment booked with- [facility] 02/16/18 @ 1:45pm (patient & family made aware)  Confirmed with patient if condition begins to worsen call PCP or go to the ER.  Patient was given the office number and encouraged to call back with question or concerns.  : yes

## 2018-02-16 ENCOUNTER — Ambulatory Visit (INDEPENDENT_AMBULATORY_CARE_PROVIDER_SITE_OTHER): Payer: Self-pay | Admitting: Internal Medicine

## 2018-02-16 VITALS — BP 145/86 | HR 76 | Temp 98.1°F | Wt 132.2 lb

## 2018-02-16 DIAGNOSIS — I10 Essential (primary) hypertension: Secondary | ICD-10-CM

## 2018-02-16 DIAGNOSIS — D500 Iron deficiency anemia secondary to blood loss (chronic): Secondary | ICD-10-CM

## 2018-02-16 DIAGNOSIS — Z79899 Other long term (current) drug therapy: Secondary | ICD-10-CM

## 2018-02-16 DIAGNOSIS — I5033 Acute on chronic diastolic (congestive) heart failure: Secondary | ICD-10-CM

## 2018-02-16 DIAGNOSIS — K2900 Acute gastritis without bleeding: Secondary | ICD-10-CM

## 2018-02-16 DIAGNOSIS — I110 Hypertensive heart disease with heart failure: Secondary | ICD-10-CM

## 2018-02-16 DIAGNOSIS — R079 Chest pain, unspecified: Secondary | ICD-10-CM

## 2018-02-16 MED ORDER — LISINOPRIL 20 MG PO TABS: 20.0000 mg | ORAL_TABLET | Freq: Every day | ORAL | 11 refills | Status: DC

## 2018-02-16 NOTE — Progress Notes (Signed)
   CC: For hospital follow-up-admitted for chest pain rule out.  HPI:  [name] is a 57 y.o. with past medical history as listed below came to the clinic for his hospital follow-up.  He was recently admitted in the hospital with chest pain, exertional dyspnea, found to have bilateral basal crackles mild lower extremity edema , hypertension and elevated JVD.  He was treated with a dose of Lasix and his cardiac workup was negative.  Patient was feeling better since discharge, had his EGD and colonoscopy done on Friday, February 11, 2018. Patient had no complaints today.  Please see assessment and plan for his chronic conditions.  Past Medical History:  Diagnosis Date  . Anemia   . CHF (congestive heart failure) ([place])   . GERD (gastroesophageal reflux disease)   . Hypertension    Review of Systems: Active except mentioned in HPI.  Physical Exam:  Vitals:   02/16/18 1411  BP: (!) 145/86  Pulse: 76  Temp: 98.1 F (36.7 C)  TempSrc: Oral  SpO2: 99%  Weight: 132 lb 3.2 oz (60 kg)    General: Vital signs reviewed.  Patient is well-developed and well-nourished, in no acute distress and cooperative with exam.  Head: Normocephalic and atraumatic. Eyes: EOMI, conjunctivae normal, no scleral icterus.  Neck: Supple, trachea midline, normal ROM, no JVD, masses, thyromegaly, or carotid bruit present.  Cardiovascular: RRR, S1 normal, S2 normal, no murmurs, gallops, or rubs. Pulmonary/Chest: Clear to auscultation bilaterally, no wheezes, rales, or rhonchi. Abdominal: Soft, non-tender, non-distended, BS +, no masses, organomegaly, or guarding present.  Extremities: No lower extremity edema bilaterally,  pulses symmetric and intact bilaterally. No cyanosis or clubbing. Skin: Warm, dry and intact. No rashes or erythema. Psychiatric: Normal mood and affect. speech and behavior is normal. Cognition and memory are normal.  Assessment & Plan:   See Encounters Tab for problem based  charting.  Patient discussed with Dr. Lynnae January.

## 2018-02-16 NOTE — Assessment & Plan Note (Addendum)
BP Readings from Last 3 Encounters:  02/16/18 (!) 145/86  02/11/18 117/68  02/10/18 110/70   His blood pressure was elevated today. He did took his medication this morning. He was switched to lisinopril 10 mg daily during current hospitalization, before that he was on amlodipine 5 mg daily.  -Increase lisinopril to 20 mg daily. -Up in 4 weeks. -Check BMP as patient was hypokalemic during his hospitalization.  Addendum.  BMP within normal limits, hypokalemia resolved.

## 2018-02-16 NOTE — Assessment & Plan Note (Signed)
He denies any more dyspnea, orthopnea or PND. He denies any lower extremity edema today.  His echo was positive for only grade 1 diastolic dysfunction with normal ejection fraction.  He was started on metoprolol and lisinopril during hospitalization. We will discontinue metoprolol because he has normal ejection fraction. Continue lisinopril. No need to add a diuretic at this time.

## 2018-02-16 NOTE — Assessment & Plan Note (Signed)
Resolved. His troponin remain negative and EKG was without any acute change. Myoview was low risk without any reversible ischemia.  Slightly and nonischemic origin due to high blood pressure.

## 2018-02-16 NOTE — Patient Instructions (Signed)
Thank you for visiting clinic today. As we discussed I am increasing the dose of your blood pressure medication from 10 mg of lisinopril to 20 mg daily.  You can take 2 tablets daily until you will finish your existing medicine.  After that you can get you a new prescription of lisinopril 20 mg daily and start taking 1 tablet. You can stop taking metoprolol. That we are doing some lab work-we will call you with any abnormal results. Your stomach doctor will call you with the results of your biopsy once it becomes available. Please continue taking your iron supplement and pantoprazole for your stomach. Please follow-up in 4 weeks with your PCP.

## 2018-02-16 NOTE — Assessment & Plan Note (Addendum)
He was found to have gastritis with multiple erosions and very friable mucosa during the EGD done, most likely the cause of his microcytic anemia. H pylori and pathology results are still pending. He was advised to take Protonix and iron supplement by his gastroenterologist.  Continue taking Protonix and iron supplement. Will repeat CBC today.  Addendum.  CBC with improvement in hemoglobin and microcytosis with some thrombocytosis at 446 most likely reactive.

## 2018-02-17 LAB — CBC
Hematocrit: 38.8 % (ref 37.5–51.0)
Hemoglobin: 12.3 g/dL — ABNORMAL LOW (ref 13.0–17.7)
MCH: 22.2 pg — AB (ref 26.6–33.0)
MCHC: 31.7 g/dL (ref 31.5–35.7)
MCV: 70 fL — AB (ref 79–97)
PLATELETS: 446 10*3/uL — AB (ref 150–379)
RBC: 5.55 x10E6/uL (ref 4.14–5.80)
RDW: 17.2 % — AB (ref 12.3–15.4)
WBC: 8 10*3/uL (ref 3.4–10.8)

## 2018-02-17 LAB — BMP8+ANION GAP
ANION GAP: 16 mmol/L (ref 10.0–18.0)
BUN / CREAT RATIO: 13 (ref 9–20)
BUN: 15 mg/dL (ref 6–24)
CO2: 20 mmol/L (ref 20–29)
CREATININE: 1.13 mg/dL (ref 0.76–1.27)
Calcium: 9.5 mg/dL (ref 8.7–10.2)
Chloride: 101 mmol/L (ref 96–106)
GFR calc Af Amer: 84 mL/min/{1.73_m2} (ref >59)
GFR, EST NON AFRICAN AMERICAN: 72 mL/min/{1.73_m2} (ref >59)
Glucose: 94 mg/dL (ref 65–99)
POTASSIUM: 4.3 mmol/L (ref 3.5–5.2)
SODIUM: 137 mmol/L (ref 134–144)

## 2018-02-18 ENCOUNTER — Other Ambulatory Visit: Payer: Self-pay

## 2018-02-18 DIAGNOSIS — D500 Iron deficiency anemia secondary to blood loss (chronic): Secondary | ICD-10-CM

## 2018-02-18 NOTE — Progress Notes (Signed)
Internal Medicine Clinic Attending  Case discussed with Dr. Amin at the time of the visit.  We reviewed the resident's history and exam and pertinent patient test results.  I agree with the assessment, diagnosis, and plan of care documented in the resident's note.    

## 2018-02-23 NOTE — Congregational Nurse Program (Signed)
Congregational Nurse Program Note  Date of Encounter: 02/23/2018  Past Medical History: Past Medical History:  Diagnosis Date  . Anemia   . CHF (congestive heart failure) ([place])   . GERD (gastroesophageal reflux disease)   . Hypertension     Encounter Details:  CN office visit.  Patient asked for help with bills.  [facility] payment of $10 made via phone using debit card.  Phone call to [facility] billing regarding $10,000+ hospital bill.  Although he has current [facility] discount of 100% he will be sent additional forms to complete to see if he qualifies for help with this bill.  Jake Michaelis RN, Congregational Nurse [phone]  CNP Questionnaire - 02/23/18 1835      Questionnaire   Patient Status  Refugee    Race  Asian    Location Patient Served At  Not Applicable    Insurance  Not Applicable    Uninsured  Uninsured (Subsequent visits/quarter)    Food  No food insecurities    Housing/Utilities  Yes, have permanent housing    Transportation  No transportation needs    Interpersonal Safety  Yes, feel physically and emotionally safe where you currently live    Medication  No medication insecurities    Medical Provider  Yes    Referrals  Not Applicable    ED Visit Averted  Not Applicable    Life-Saving Intervention Made  Not Applicable      Clinical Intake - 02/08/18 1039      Pre-visit preparation   Pre-visit preparation completed  Yes      Pain   Pain   0-10    Pain Score  8     Pain Type  Chronic pain    Pain Location  Abdomen    Pain Orientation  Lower    Pain Descriptors / Indicators  Burning    Pain Onset  1 to 4 weeks ago    Pain Frequency  Constant      Nutrition Screen   BMI - recorded  25.48    Nutritional Status  BMI 25 -29 Overweight    Nutritional Risks  None    Diabetes  No      Functional Status   Activities of Daily Living  Independent    Ambulation  Independent    Medication Administration  Independent    Home Management  Independent      Risk/Barriers   Barriers to Care Management & Learning  Language      Abuse/Neglect   Do you feel unsafe in your current relationship?  No    Anyone hurting you at home, work, or school?  No    Unable to ask?  No    Information provided on Community resources  No      Patient Literacy   How often do you need to have someone help you when you read instructions, pamphlets, or other written materials from your doctor or pharmacy?  5 - Always      Language Assistant   Interpreter Needed?  No      Comments   Information entered by :  [facility] RCOM, CMA 02/08/2018 10:41AM

## 2018-02-25 ENCOUNTER — Encounter: Payer: Self-pay | Admitting: Internal Medicine

## 2018-02-28 ENCOUNTER — Other Ambulatory Visit: Payer: Self-pay

## 2018-02-28 ENCOUNTER — Other Ambulatory Visit: Payer: Self-pay | Admitting: Internal Medicine

## 2018-02-28 MED ORDER — PANTOPRAZOLE SODIUM 40 MG PO TBEC: 40.0000 mg | DELAYED_RELEASE_TABLET | Freq: Two times a day (BID) | ORAL | 2 refills | Status: DC

## 2018-02-28 NOTE — Telephone Encounter (Signed)
pantoprazole (PROTONIX) 40 MG tablet, refill request @ health department. Pt states he was taking Protonix twice daily, and not 1 tablet daily.

## 2018-02-28 NOTE — Telephone Encounter (Signed)
Sent a new prescription to his pharmacy.

## 2018-03-01 NOTE — Telephone Encounter (Signed)
Rx phoned into pharmacy.Regenia Skeeter, Darlene Cassady3/19/20199:10 AM  Left on recorder

## 2018-03-09 NOTE — Congregational Nurse Program (Signed)
Congregational Nurse Program Note  Date of Encounter: 03/09/2018  Past Medical History: Past Medical History:  Diagnosis Date  . Anemia   . CHF (congestive heart failure) ([place])   . GERD (gastroesophageal reflux disease)   . Hypertension     Encounter Details:  CN office visit with interpreter Diu Hartshorn assisting.  We interpreted and explained patient information that he received after his colonoscopy and endoscopy.  [employer] intern Rayford Halsted called [place] office to check on his application which has been received and is being processed.  Patient will wait for written response.   Jake Michaelis RNm, Congregational Nurse [phone] CNP Questionnaire - 03/09/18 2132      Questionnaire   Patient Status  Refugee    Race  Asian    Location Patient Served At  Not Applicable    Insurance  Not Applicable    Uninsured  Uninsured (Subsequent visits/quarter)    Food  No food insecurities    Housing/Utilities  Yes, have permanent housing    Transportation  No transportation needs    Interpersonal Safety  Yes, feel physically and emotionally safe where you currently live    Medication  No medication insecurities    Medical Provider  Yes    Referrals  Not Applicable    ED Visit Averted  Not Applicable    Life-Saving Intervention Made  Not Applicable

## 2018-03-11 ENCOUNTER — Ambulatory Visit (INDEPENDENT_AMBULATORY_CARE_PROVIDER_SITE_OTHER): Payer: Self-pay | Admitting: Internal Medicine

## 2018-03-11 ENCOUNTER — Encounter: Payer: Self-pay | Admitting: Internal Medicine

## 2018-03-11 ENCOUNTER — Other Ambulatory Visit: Payer: Self-pay

## 2018-03-11 VITALS — BP 121/78 | HR 99 | Temp 98.2°F | Ht 62.0 in | Wt 133.2 lb

## 2018-03-11 DIAGNOSIS — D126 Benign neoplasm of colon, unspecified: Secondary | ICD-10-CM

## 2018-03-11 DIAGNOSIS — K219 Gastro-esophageal reflux disease without esophagitis: Secondary | ICD-10-CM

## 2018-03-11 DIAGNOSIS — M25519 Pain in unspecified shoulder: Secondary | ICD-10-CM

## 2018-03-11 DIAGNOSIS — M5489 Other dorsalgia: Secondary | ICD-10-CM

## 2018-03-11 DIAGNOSIS — D500 Iron deficiency anemia secondary to blood loss (chronic): Secondary | ICD-10-CM

## 2018-03-11 DIAGNOSIS — I10 Essential (primary) hypertension: Secondary | ICD-10-CM

## 2018-03-11 DIAGNOSIS — Z79899 Other long term (current) drug therapy: Secondary | ICD-10-CM

## 2018-03-11 DIAGNOSIS — R590 Localized enlarged lymph nodes: Secondary | ICD-10-CM | POA: Insufficient documentation

## 2018-03-11 NOTE — Progress Notes (Signed)
Internal Medicine Clinic Attending  Case discussed with Dr. Amin at the time of the visit.  We reviewed the resident's history and exam and pertinent patient test results.  I agree with the assessment, diagnosis, and plan of care documented in the resident's note.    

## 2018-03-11 NOTE — Progress Notes (Signed)
   CC: For follow-up of his hypertension.  HPI:  [name] is a 57 y.o. with past medical history as listed below came to the clinic for follow-up of his hypertension.  He was complaining of occasional pain in his upper back around shoulder blade after driving for longer hours.  He was advised to use heating pad and or Tylenol if needed. 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  Please see assessment and plan for his chronic conditions.  Past Medical History:  Diagnosis Date  . Anemia   . CHF (congestive heart failure) ([place])   . GERD (gastroesophageal reflux disease)   . Hypertension    Review of Systems: Negative except mentioned in HPI.  Physical Exam:  Vitals:   03/11/18 1407  BP: 121/78  Pulse: 99  Temp: 98.2 F (36.8 C)  TempSrc: Oral  SpO2: 100%  Weight: 133 lb 3.2 oz (60.4 kg)  Height: 5\' 2"  (1.575 m)    General: Vital signs reviewed.  Patient is well-developed and well-nourished, in no acute distress and cooperative with exam.  Head: Normocephalic and atraumatic. Eyes: EOMI, conjunctivae normal, no scleral icterus.  Cardiovascular: RRR, S1 normal, S2 normal, no murmurs, gallops, or rubs. Pulmonary/Chest: Clear to auscultation bilaterally, no wheezes, rales, or rhonchi. Abdominal: Soft, non-tender, non-distended, BS +, no masses, organomegaly, or guarding present.  Musculoskeletal: No tenderness around shoulder girdle, no unrestricted range of motion. Extremities: No lower extremity edema bilaterally,  pulses symmetric and intact bilaterally. No cyanosis or clubbing. Skin: Warm, dry and intact. No rashes or erythema. Psychiatric: Normal mood and affect. speech and behavior is normal. Cognition and memory are normal.  Assessment & Plan:   See Encounters Tab for problem based charting.  Patient discussed with Dr. Lynnae January.

## 2018-03-11 NOTE — Patient Instructions (Signed)
Thank you for visiting clinic today. I am glad that your blood pressure is very good today-keep up the good work and keep taking your medicine as directed. For your upper back pain-you can try heating pad and or Tylenol as needed, please stay away from ibuprofen and Aleve because of your stomach problem. Please follow-up in clinic in 52-month.

## 2018-03-11 NOTE — Assessment & Plan Note (Signed)
CT abdomen done in February 2019 shows multiple nonpathologically enlarged mesenteric and retroperitoneal lymph node. EGD and colonoscopy without any sign of malignancy.  -Recommendation was to repeat CT abdomen in 6 months which will be August 2019.

## 2018-03-11 NOTE — Assessment & Plan Note (Signed)
BP Readings from Last 3 Encounters:  03/11/18 121/78  02/16/18 (!) 145/86  02/11/18 117/68   He responded very well to increase in his lisinopril from 10 mg daily to 20 mg daily. His blood pressure was within the goal today.  Continue lisinopril 20 mg daily.   Follow-up in 8-month

## 2018-03-11 NOTE — Assessment & Plan Note (Signed)
He is compliant with Protonix. Denies any abdominal pain. EGD with erosive gastritis but negative for H. Pylori. Colonoscopy with 2 small polyp with no malignancy on pathology.  He will follow-up with GI in May 2019. Might get a capsule endoscopy.

## 2018-03-29 ENCOUNTER — Ambulatory Visit: Payer: Self-pay

## 2018-04-20 NOTE — Congregational Nurse Program (Signed)
Congregational Nurse Program Note  Date of Encounter: 04/20/2018  Past Medical History: Past Medical History:  Diagnosis Date  . Anemia   . CHF (congestive heart failure) ([place])   . GERD (gastroesophageal reflux disease)   . Hypertension     Encounter Details:  CN office visit with interpreter Diu Hartshorn assisting.  He is complaining of pain in left nostril which has been present 2 or 3 years but has become more painful in the past week.  States it is causing headaches and produces a discharge which sometimes makes it hard to breathe.  Appears to have a red enlarged area on inner nasal wall.  Referred to [facility].  Appointment scheduled for 04/25/2018 at 10:45 am.  Also helped him with [facility]; we faxed a copy of his [place] assistance letter which they said they will match. Jake Michaelis RN, Congregational nurse [phone] Congregational Nurse [phone] CNP Questionnaire - 04/20/18 2118      Questionnaire   Patient Status  Refugee    Race  Asian    Location Patient Served At  Not Applicable    Insurance  Not Applicable    Uninsured  Uninsured (Subsequent visits/quarter)    Food  No food insecurities    Housing/Utilities  Yes, have permanent housing    Transportation  No transportation needs    Interpersonal Safety  Yes, feel physically and emotionally safe where you currently live    Medication  No medication insecurities    Medical Provider  Yes    Referrals  Primary Care Provider/Clinic    ED Visit Averted  Not Applicable    Life-Saving Intervention Made  Not Applicable

## 2018-04-21 ENCOUNTER — Ambulatory Visit: Payer: Self-pay | Admitting: Gastroenterology

## 2018-04-25 ENCOUNTER — Encounter (INDEPENDENT_AMBULATORY_CARE_PROVIDER_SITE_OTHER): Payer: Self-pay

## 2018-04-25 ENCOUNTER — Other Ambulatory Visit: Payer: Self-pay

## 2018-04-25 ENCOUNTER — Ambulatory Visit (INDEPENDENT_AMBULATORY_CARE_PROVIDER_SITE_OTHER): Payer: Self-pay | Admitting: Internal Medicine

## 2018-04-25 VITALS — BP 139/91 | HR 49 | Temp 98.0°F | Ht 62.0 in | Wt 134.6 lb

## 2018-04-25 DIAGNOSIS — J329 Chronic sinusitis, unspecified: Secondary | ICD-10-CM

## 2018-04-25 DIAGNOSIS — Z87891 Personal history of nicotine dependence: Secondary | ICD-10-CM

## 2018-04-25 MED ORDER — DM-GUAIFENESIN ER 30-600 MG PO TB12: 1.0000 | ORAL_TABLET | Freq: Two times a day (BID) | ORAL | 0 refills | Status: DC | PRN

## 2018-04-25 MED ORDER — LORATADINE 10 MG PO TABS: 10.0000 mg | ORAL_TABLET | Freq: Every day | ORAL | 3 refills | Status: DC

## 2018-04-25 MED ORDER — FLUTICASONE PROPIONATE 50 MCG/ACT NA SUSP: 2.0000 | Freq: Every day | NASAL | 0 refills | Status: DC

## 2018-04-25 NOTE — Progress Notes (Signed)
CC: sinus pressure, rhinorrhea, cough  HPI:  [name] is a 57 y.o. male with PMH below, here for an acute visit to address symptoms resulting from his seasonal allergies.  He has had worsening sinus pressure, rhinorrhea and cough over the last month.    Please see A&P for status of the patient's chronic medical conditions  Past Medical History:  Diagnosis Date  . Anemia   . CHF (congestive heart failure) ([place])   . GERD (gastroesophageal reflux disease)   . Hypertension    Review of Systems:  ROS: Pulmonary: pt denies increased work of breathing, shortness of breath,  Cardiac: pt denies palpitations, chest pain,  Abdominal: pt denies abdominal pain, nausea, vomiting, or diarrhea  Physical Exam:  Vitals:   04/25/18 1039  BP: (!) 139/91  Pulse: (!) 49  Temp: 98 F (36.7 C)  TempSrc: Oral  SpO2: 100%  Weight: 134 lb 9.6 oz (61.1 kg)  Height: 5\' 2"  (1.575 m)   Physical Exam  Constitutional: He appears well-developed and well-nourished.  HENT:  Head: Head is without right periorbital erythema and without left periorbital erythema.  Nose: Mucosal edema, rhinorrhea, sinus tenderness and nasal deformity (swelling of bilateral medial nasal turbinates) present. Right sinus exhibits no maxillary sinus tenderness and no frontal sinus tenderness. Left sinus exhibits no maxillary sinus tenderness and no frontal sinus tenderness.  Mouth/Throat: Oropharynx is clear and moist. No posterior oropharyngeal edema or posterior oropharyngeal erythema.  Eyes: Right eye exhibits no discharge. Left eye exhibits no discharge. No scleral icterus.  Cardiovascular: Normal rate, regular rhythm, normal heart sounds and intact distal pulses. Exam reveals no gallop and no friction rub.  No murmur heard. Pulmonary/Chest: Effort normal and breath sounds normal. No respiratory distress. He has no wheezes. He has no rales.  Abdominal: Soft. Bowel sounds are normal. He exhibits no distension and no mass.  There is no tenderness. There is no guarding.  Musculoskeletal: He exhibits no edema (No LE edema bilaterally).  Neurological: He is alert.     Social History   Socioeconomic History  . Marital status: Married    Spouse name: Not on file  . Number of children: Not on file  . Years of education: Not on file  . Highest education level: Not on file  Occupational History  . Not on file  Social Needs  . Financial resource strain: Not on file  . Food insecurity:    Worry: Not on file    Inability: Not on file  . Transportation needs:    Medical: Not on file    Non-medical: Not on file  Tobacco Use  . Smoking status: Former Research scientist (life sciences)  . Smokeless tobacco: Never Used  Substance and Sexual Activity  . Alcohol use: No    Frequency: Never    Comment: former  . Drug use: No  . Sexual activity: Not on file  Lifestyle  . Physical activity:    Days per week: Not on file    Minutes per session: Not on file  . Stress: Not on file  Relationships  . Social connections:    Talks on phone: Not on file    Gets together: Not on file    Attends religious service: Not on file    Active member of club or organization: Not on file    Attends meetings of clubs or organizations: Not on file    Relationship status: Not on file  . Intimate partner violence:    Fear of current or  ex partner: Not on file    Emotionally abused: Not on file    Physically abused: Not on file    Forced sexual activity: Not on file  Other Topics Concern  . Not on file  Social History Narrative  . Not on file    Family History  Family history unknown: Yes    Assessment & Plan:   See Encounters Tab for problem based charting.  Patient discussed with Dr. Rebeca Alert

## 2018-04-25 NOTE — Assessment & Plan Note (Addendum)
Patient presents with worsening sinus pressure, rhinorrhea, cough over the last month.  He denies any fevers,, chills, sick contacts.  He is completely out of all allergy medication at this time.  Additionally he reports feeling of fullness in his ears and pressure and headache.  His exam is consistent with sinusitis related to his allergies.   -Will give the patient a prescription for Mucinex DM, Claritin 10 mg daily, Flonase -Instructed the patient to take over-the-counter Tylenol for his headache -Deferred any antibiotic therapy at this time he just had some antibiotics a few months ago for these symptoms, no symptoms or objective findings concerning for infection -Has not established care with an ENT physician in the past, encouraged patient to return if this does not abate his symptoms

## 2018-04-25 NOTE — Patient Instructions (Addendum)
Eric Ingram, we will start by treating your sinusitis.  I expect this will make you feel much better.  I have sent prescriptions to your pharmacy which are shown on your paperwork.  Please return to [place] if you begin to feel worse developing fever, or chills.   Sinusitis, Adult Sinusitis is soreness and inflammation of your sinuses. Sinuses are hollow spaces in the bones around your face. They are located:  Around your eyes.  In the middle of your forehead.  Behind your nose.  In your cheekbones.  Your sinuses and nasal passages are lined with a stringy fluid (mucus). Mucus normally drains out of your sinuses. When your nasal tissues get inflamed or swollen, the mucus can get trapped or blocked so air cannot flow through your sinuses. This lets bacteria, viruses, and funguses grow, and that leads to infection. Follow these instructions at home: Medicines  Take, use, or apply over-the-counter and prescription medicines only as told by your doctor. These may include nasal sprays.  If you were prescribed an antibiotic medicine, take it as told by your doctor. Do not stop taking the antibiotic even if you start to feel better. Hydrate and Humidify  Drink enough water to keep your pee (urine) clear or pale yellow.  Use a cool mist humidifier to keep the humidity level in your home above 50%.  Breathe in steam for 10-15 minutes, 3-4 times a day or as told by your doctor. You can do this in the bathroom while a hot shower is running.  Try not to spend time in cool or dry air. Rest  Rest as much as possible.  Sleep with your head raised (elevated).  Make sure to get enough sleep each night. General instructions  Put a warm, moist washcloth on your face 3-4 times a day or as told by your doctor. This will help with discomfort.  Wash your hands often with soap and water. If there is no soap and water, use hand sanitizer.  Do not smoke. Avoid being around people who are smoking (secondhand  smoke).  Keep all follow-up visits as told by your doctor. This is important. Contact a doctor if:  You have a fever.  Your symptoms get worse.  Your symptoms do not get better within 10 days. Get help right away if:  You have a very bad headache.  You cannot stop throwing up (vomiting).  You have pain or swelling around your face or eyes.  You have trouble seeing.  You feel confused.  Your neck is stiff.  You have trouble breathing. This information is not intended to replace advice given to you by your health care provider. Make sure you discuss any questions you have with your health care provider. Document Released: 05/18/2008 Document Revised: 07/26/2016 Document Reviewed: 09/25/2015 Elsevier Interactive Patient Education  [employer].

## 2018-04-27 NOTE — Progress Notes (Signed)
Internal Medicine Clinic Attending  Case discussed with Dr. Winfrey  at the time of the visit.  We reviewed the resident's history and exam and pertinent patient test results.  I agree with the assessment, diagnosis, and plan of care documented in the resident's note.  Alexander N Raines, MD   

## 2018-05-12 ENCOUNTER — Ambulatory Visit (INDEPENDENT_AMBULATORY_CARE_PROVIDER_SITE_OTHER): Payer: Self-pay | Admitting: Internal Medicine

## 2018-05-12 ENCOUNTER — Other Ambulatory Visit: Payer: Self-pay

## 2018-05-12 VITALS — BP 149/84 | HR 93 | Temp 98.3°F | Ht 62.0 in | Wt 133.0 lb

## 2018-05-12 DIAGNOSIS — J321 Chronic frontal sinusitis: Secondary | ICD-10-CM

## 2018-05-12 MED ORDER — AMOXICILLIN-POT CLAVULANATE 875-125 MG PO TABS: 1.0000 | ORAL_TABLET | Freq: Two times a day (BID) | ORAL | 0 refills | Status: AC

## 2018-05-12 NOTE — Patient Instructions (Signed)
I have prescribed a 10-day course of Augmentin twice daily for your recurrent sinusitis.  We will try to arrange an appointment with the ENT doctor to evaluate why you are having recurrent sinus infections.  I am not completely sure why you have the shoulder pain today.  For start I recommend trying some upper back strengthening " scapular stabilization" type of exercises.  This can decrease some of the strain on the upper part of the shoulder for maintaining normal posture.

## 2018-05-12 NOTE — Progress Notes (Signed)
   CC: Sinusitis  HPI:  [name] is a 57 y.o. male with PMHx detailed below presenting for non-resolving sinus pain since his visit earlier this month on 5/13.  He has been using the Flonase, Claritin, and Mucinex without significant improvement in his symptoms.  He states there is much more pain and tenderness now which is sometimes becoming a bilateral headache.  He denies noticing fevers.  He feels congested but is not able to produce much mucus with coughing or sneezing.  His ears feel stuffed and sometimes dullness of hearing without any pain.  See problem based assessment and plan below for additional details.  Chronic sinusitis His symptoms have not improved with symptomatic treatment since his prior visit on 5/13.  He has significant frontal sinus pain but is no longer having significant drainage.  He is having low-grade fever and chills. Referral to ENT for evaluation of his recurrent sinusitis episodes have been previously recommended but I see no referral and patient is unaware of plan for this. Plan: Augmentin twice daily x10 days Recommended continuing previous supportive care Referral to ENT for recurrent sinusitis   Past Medical History:  Diagnosis Date  . Anemia   . CHF (congestive heart failure) ([place])   . GERD (gastroesophageal reflux disease)   . Hypertension     Review of Systems: Review of Systems  Constitutional: Negative for fever.  HENT: Positive for congestion, hearing loss and sinus pain. Negative for ear discharge, ear pain, nosebleeds and tinnitus.   Eyes: Negative for redness.  Respiratory: Positive for cough. Negative for shortness of breath and wheezing.   Gastrointestinal: Negative for diarrhea and nausea.  Musculoskeletal: Negative for myalgias.  Skin: Negative for rash.  Neurological: Positive for dizziness.     Physical Exam: Vitals:   05/12/18 0943  BP: (!) 149/84  Pulse: 93  Temp: 98.3 F (36.8 C)  TempSrc: Oral  SpO2: 100%    Weight: 133 lb (60.3 kg)  Height: 5\' 2"  (1.575 m)   GENERAL- alert, co-operative, NAD HEENT-frontal sinus tenderness to palpation, very mild maxillary tenderness, nasal mucosa appears more erythematous on the left, no oropharyngeal erythema or exudate, no cervical lymphadenopathy CARDIAC- RRR, no murmurs, rubs or gallops. RESP- CTAB, no wheezes or crackles. EXTREMITY-tenderness to palpation over the, lateral deltoid, trapezius muscles, bicipital groove in both shoulders, normal range of motion and strength SKIN- Warm, dry, No rash or lesion.   Assessment & Plan:   See encounters tab for problem based medical decision making.   Patient discussed with Dr. Dareen Piano

## 2018-05-16 NOTE — Assessment & Plan Note (Signed)
His symptoms have not improved with symptomatic treatment since his prior visit on 5/13.  He has significant frontal sinus pain but is no longer having significant drainage.  He is having low-grade fever and chills. Referral to ENT for evaluation of his recurrent sinusitis episodes have been previously recommended but I see no referral and patient is unaware of plan for this. Plan: Augmentin twice daily x10 days Recommended continuing previous supportive care Referral to ENT for recurrent sinusitis

## 2018-05-18 NOTE — Congregational Nurse Program (Signed)
Congregational Nurse Program Note  Date of Encounter: 05/18/2018  Past Medical History: Past Medical History:  Diagnosis Date  . Anemia   . CHF (congestive heart failure) ([place])   . GERD (gastroesophageal reflux disease)   . Hypertension     Encounter Details:  Patient came to CN office asking for help with medical bills.  A copy of [facility] discount letter was faxed to each respective billing office.  Continues to have headaches and nasal congestion with pain. States he has an MD appointment this Friday to follow-up with this problem. Ciro Backer, [facility] Nurse [phone] CNP Questionnaire - 05/18/18 1956      Questionnaire   Patient Status  Refugee    Race  Asian    Location Patient Served At  Not Applicable    Insurance  Not Applicable    Uninsured  Uninsured (Subsequent visits/quarter)    Food  No food insecurities    Housing/Utilities  Yes, have permanent housing    Transportation  No transportation needs    Interpersonal Safety  Yes, feel physically and emotionally safe where you currently live    Medication  No medication insecurities    Medical Provider  Yes    Referrals  Not Applicable    ED Visit Averted  Not Applicable    Life-Saving Intervention Made  Not Applicable      Clinical Intake - 05/12/18 0944      Pre-visit preparation   Pre-visit preparation completed  Yes      Pain   Pain   0-10    Pain Score  9     Pain Type  Chronic pain    Pain Location  Nose    Pain Orientation  Left    Pain Radiating Towards  goes to head     Pain Descriptors / Indicators  Aching    Pain Onset  Other (comment) for awhile    for awhile    Pain Frequency  Constant    Pain Relieving Factors  Nothing     Effect of Pain on Daily Activities  mucous goes to chest       Nutrition Screen   BMI - recorded  24.33    Nutritional Status  BMI of 19-24  Normal    Nutritional Risks  None    Diabetes  No      Functional Status   Activities of Daily Living  Independent     Ambulation  Independent    Medication Administration  Independent    Home Management  Independent      Risk/Barriers   Barriers to Care Management & Learning  Language Interpreter -[place]   Interpreter -Rhade     Abuse/Neglect   Do you feel unsafe in your current relationship?  No    Do you feel physically threatened by others?  No    Anyone hurting you at home, work, or school?  No    Unable to ask?  No      Patient Literacy   How often do you need to have someone help you when you read instructions, pamphlets, or other written materials from your doctor or pharmacy?  5 - Always    What is the last grade level you completed in school?  [place]     Interpreter Name  Laurin Coder     Patient Declined Interpreter   No    Patient  signed [place] waiver  No      Comments   Information entered by :  Sander Nephew, RN  05/12/2018 9:52 AM

## 2018-05-18 NOTE — Congregational Nurse Program (Signed)
Congregational Nurse Program Note  Date of Encounter: 05/18/2018  Past Medical History: Past Medical History:  Diagnosis Date  . Anemia   . CHF (congestive heart failure) ([place])   . GERD (gastroesophageal reflux disease)   . Hypertension     Encounter Details:  Patient came to CN office for assistance with medical bills.  Copy of [facility] Discount letter faxed to respective billing departments.   He states he continues to have problems with headaches and nasal congestion but has an MD appointment on Friday to follow-up with this problem.  Jake Michaelis RN, Congregational Nurse [phone] CNP Questionnaire - 05/18/18 1956      Questionnaire   Patient Status  Refugee    Race  Asian    Location Patient Served At  Not Applicable    Insurance  Not Applicable    Uninsured  Uninsured (Subsequent visits/quarter)    Food  No food insecurities    Housing/Utilities  Yes, have permanent housing    Transportation  No transportation needs    Interpersonal Safety  Yes, feel physically and emotionally safe where you currently live    Medication  No medication insecurities    Medical Provider  Yes    Referrals  Not Applicable    ED Visit Averted  Not Applicable    Life-Saving Intervention Made  Not Applicable      Clinical Intake - 05/12/18 0944      Pre-visit preparation   Pre-visit preparation completed  Yes      Pain   Pain   0-10    Pain Score  9     Pain Type  Chronic pain    Pain Location  Nose    Pain Orientation  Left    Pain Radiating Towards  goes to head     Pain Descriptors / Indicators  Aching    Pain Onset  Other (comment) for awhile    for awhile    Pain Frequency  Constant    Pain Relieving Factors  Nothing     Effect of Pain on Daily Activities  mucous goes to chest       Nutrition Screen   BMI - recorded  24.33    Nutritional Status  BMI of 19-24  Normal    Nutritional Risks  None    Diabetes  No      Functional Status   Activities of Daily Living   Independent    Ambulation  Independent    Medication Administration  Independent    Home Management  Independent      Risk/Barriers   Barriers to Care Management & Learning  Language Interpreter -Rhade   Interpreter -Rhade     Abuse/Neglect   Do you feel unsafe in your current relationship?  No    Do you feel physically threatened by others?  No    Anyone hurting you at home, work, or school?  No    Unable to ask?  No      Patient Literacy   How often do you need to have someone help you when you read instructions, pamphlets, or other written materials from your doctor or pharmacy?  5 - Always    What is the last grade level you completed in school?  [place]     Interpreter Name  Laurin Coder     Patient Declined Interpreter   No    Patient  signed [place] waiver  No      Comments   Information entered by :  Sander Nephew, RN  05/12/2018 9:52 AM

## 2018-05-23 NOTE — Progress Notes (Signed)
Internal Medicine Clinic Attending  Case discussed with Dr. Rice at the time of the visit.  We reviewed the resident's history and exam and pertinent patient test results.  I agree with the assessment, diagnosis, and plan of care documented in the resident's note.  

## 2018-06-08 NOTE — Congregational Nurse Program (Signed)
Congregational Nurse Program Note  Date of Encounter: 06/08/2018  Past Medical History: Past Medical History:  Diagnosis Date  . Anemia   . CHF (congestive heart failure) ([place])   . GERD (gastroesophageal reflux disease)   . Hypertension     Encounter Details:  CN office visit with interpreter Diu Hartshorn assisting.  Patient states he continues to have nasal congestion with facial pain and headaches.  He also thinks his ears feel "stopped up" and he has difficulty hearing.  States he has follow up appointment with his PCP on 06/10/2018 but has not heard about an appointment with a specialist.  He also asked [place] to call about medical bills which are covered by [facility].  Jake Michaelis RN, Congregational nurse [phone] CNP Questionnaire - 06/08/18 1623      Questionnaire   Patient Status  Refugee    Race  Asian    Location Patient Served At  Not Applicable    Insurance  Not Applicable    Uninsured  Uninsured (Subsequent visits/quarter)    Food  No food insecurities    Housing/Utilities  Yes, have permanent housing    Transportation  No transportation needs    Interpersonal Safety  Yes, feel physically and emotionally safe where you currently live    Medication  No medication insecurities    Medical Provider  Yes    Referrals  Not Applicable    ED Visit Averted  Not Applicable    Life-Saving Intervention Made  Not Applicable

## 2018-06-10 ENCOUNTER — Ambulatory Visit (INDEPENDENT_AMBULATORY_CARE_PROVIDER_SITE_OTHER): Payer: Self-pay | Admitting: Internal Medicine

## 2018-06-10 ENCOUNTER — Other Ambulatory Visit: Payer: Self-pay

## 2018-06-10 ENCOUNTER — Encounter: Payer: Self-pay | Admitting: Internal Medicine

## 2018-06-10 VITALS — BP 148/92 | HR 76 | Temp 98.7°F | Ht 62.0 in | Wt 133.8 lb

## 2018-06-10 DIAGNOSIS — Z79899 Other long term (current) drug therapy: Secondary | ICD-10-CM

## 2018-06-10 DIAGNOSIS — D500 Iron deficiency anemia secondary to blood loss (chronic): Secondary | ICD-10-CM

## 2018-06-10 DIAGNOSIS — I110 Hypertensive heart disease with heart failure: Secondary | ICD-10-CM

## 2018-06-10 DIAGNOSIS — J321 Chronic frontal sinusitis: Secondary | ICD-10-CM

## 2018-06-10 DIAGNOSIS — J328 Other chronic sinusitis: Secondary | ICD-10-CM

## 2018-06-10 DIAGNOSIS — I10 Essential (primary) hypertension: Secondary | ICD-10-CM

## 2018-06-10 DIAGNOSIS — I5033 Acute on chronic diastolic (congestive) heart failure: Secondary | ICD-10-CM

## 2018-06-10 DIAGNOSIS — Z8719 Personal history of other diseases of the digestive system: Secondary | ICD-10-CM

## 2018-06-10 MED ORDER — PANTOPRAZOLE SODIUM 40 MG PO TBEC
40.0000 mg | DELAYED_RELEASE_TABLET | Freq: Every day | ORAL | 2 refills | Status: DC
Start: 2018-06-10 — End: 2018-10-11

## 2018-06-10 MED ORDER — LISINOPRIL 40 MG PO TABS: 40.0000 mg | ORAL_TABLET | Freq: Every day | ORAL | 3 refills | Status: DC

## 2018-06-10 NOTE — Assessment & Plan Note (Addendum)
Continue to have symptoms of chronic sinusitis despite being treated with antibiotics multiple times.  Waiting for an appointment call from ENT. And has orange card which is going to expire on July 19, had an schedule appointment with Bonna Gains next week for continuation of orange card.  CT sinus was ordered. Advised to continue using Claritin. Advised to use Flonase if he can afford it currently not using it because of the cost.

## 2018-06-10 NOTE — Assessment & Plan Note (Addendum)
Patient denies any symptoms of exertional dyspnea. Last CBC check his hemoglobin was improving. Denies any melena or hematochezia.  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 He never followed up with GI which was supposed to be done in May.  He will continue Protonix. He was advised to make an appointment with [facility], the number was provided, interpreter will help him make an appointment.

## 2018-06-10 NOTE — Patient Instructions (Signed)
Thank you for visiting clinic today. As we discussed and provided you with your gastroenterologist number, please call and make an appointment. You will get a call from ENT specialist for an appointment for your sinus congestion. I am ordering a CT sinus for you for further evaluation. Your blood pressure was high, I am increasing the dose of lisinopril to 40 mg daily.  I am giving you a new prescription please take it to [place] and  start taking your new medication from tomorrow. Please follow-up in 1 month for your blood pressure check.

## 2018-06-10 NOTE — Progress Notes (Signed)
   CC: For follow-up of his hypertension and chronic sinusitis.  HPI:  [name] is a 57 y.o. with past medical history as listed below came to the clinic for follow-up of his hypertension and chronic sinusitis.  Patient continued to experience bilateral nasal congestion with increased mucus discharge, sinus pressure mostly frontal he did completed his 10-day course of Augmentin, which was given to him on May 12, 2018 with minimum relief.  He is not using Flonase because of the cost.  Continue to use Claritin. He was given referral to see an ENT but has not heard back yet.  See assessment and plan for his chronic conditions.  Patient does not speak English history was obtained with the help of an interpreter,  Past Medical History:  Diagnosis Date  . Anemia   . CHF (congestive heart failure) ([place])   . GERD (gastroesophageal reflux disease)   . Hypertension    Review of Systems: Active except mentioned in HPI.  Physical Exam:  Vitals:   06/10/18 1510  BP: (!) 152/86  Pulse: 79  Temp: 98.7 F (37.1 C)  TempSrc: Oral  SpO2: 99%  Weight: 133 lb 12.8 oz (60.7 kg)  Height: 5\' 2"  (1.575 m)    General: Vital signs reviewed.  Patient is well-developed and well-nourished, in no acute distress and cooperative with exam.  Head: Normocephalic and atraumatic. Mild frontal and maxillary sinus tenderness, edema and erythema of bilateral nasal turbinate. Eyes: EOMI, conjunctivae normal, no scleral icterus.  Neck: Supple, trachea midline, normal ROM, no JVD, masses, thyromegaly, or carotid bruit present.  Cardiovascular: RRR, S1 normal, S2 normal, no murmurs, gallops, or rubs. Pulmonary/Chest: Clear to auscultation bilaterally, no wheezes, rales, or rhonchi. Abdominal: Soft, non-tender, non-distended, BS +, no masses, organomegaly, or guarding present.  Extremities: No lower extremity edema bilaterally,  pulses symmetric and intact bilaterally. No cyanosis or clubbing. Skin: Warm, dry  and intact. No rashes or erythema. Psychiatric: Normal mood and affect. speech and behavior is normal. Cognition and memory are normal.  Assessment & Plan:   See Encounters Tab for problem based charting.  Patient discussed with Dr. Lynnae January.

## 2018-06-10 NOTE — Assessment & Plan Note (Signed)
Denies any chest pain or exertional dyspnea. No lower extremity edema.

## 2018-06-10 NOTE — Assessment & Plan Note (Signed)
BP Readings from Last 3 Encounters:  06/10/18 (!) 148/92  05/12/18 (!) 149/84  04/25/18 (!) 139/91   His blood pressure was elevated today. According to patient he is compliant with his medication and did took his lisinopril this morning.  Increase lisinopril to 40 mg daily. Follow-up in 1 month for blood pressure check.

## 2018-06-14 NOTE — Progress Notes (Signed)
Internal Medicine Clinic Attending  Case discussed with Dr. Amin at the time of the visit.  We reviewed the resident's history and exam and pertinent patient test results.  I agree with the assessment, diagnosis, and plan of care documented in the resident's note.    

## 2018-06-21 ENCOUNTER — Ambulatory Visit: Payer: Self-pay

## 2018-07-06 ENCOUNTER — Ambulatory Visit (HOSPITAL_COMMUNITY)
Admission: RE | Admit: 2018-07-06 | Discharge: 2018-07-06 | Disposition: A | Discharge status: Home / Self Care | Payer: Self-pay | Source: Ambulatory Visit | Attending: Internal Medicine | Admitting: Internal Medicine

## 2018-07-06 DIAGNOSIS — K032 Erosion of teeth: Secondary | ICD-10-CM | POA: Insufficient documentation

## 2018-07-06 DIAGNOSIS — J321 Chronic frontal sinusitis: Secondary | ICD-10-CM

## 2018-07-06 DIAGNOSIS — H748X2 Other specified disorders of left middle ear and mastoid: Secondary | ICD-10-CM | POA: Insufficient documentation

## 2018-07-06 DIAGNOSIS — K029 Dental caries, unspecified: Secondary | ICD-10-CM | POA: Insufficient documentation

## 2018-07-06 DIAGNOSIS — J3489 Other specified disorders of nose and nasal sinuses: Secondary | ICD-10-CM | POA: Insufficient documentation

## 2018-07-06 IMAGING — CT CT MAXILLOFACIAL W/ CM
3 series · 15 of 47 positions shown, 18 images · IV contrast (omnipaque)
Comparison: [DATE]

CLINICAL DATA: Chronic frontal sinusitis

EXAM:
CT MAXILLOFACIAL WITH CONTRAST
TECHNIQUE: Multidetector CT imaging of the maxillofacial structures was
performed with intravenous contrast. Multiplanar CT image
reconstructions were also generated.
CONTRAST:  100mL OMNIPAQUE IOHEXOL 300 MG/ML  SOLN

[Series 3: ax standard · axial · 0.44mm/px · z∈[-264,-111]mm · 9 of 179 slices shown, 12 images]
[im 13/179  brain]
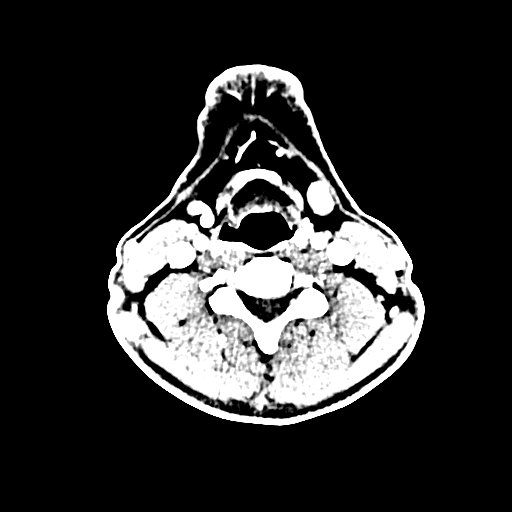
[im 13/179  bone]
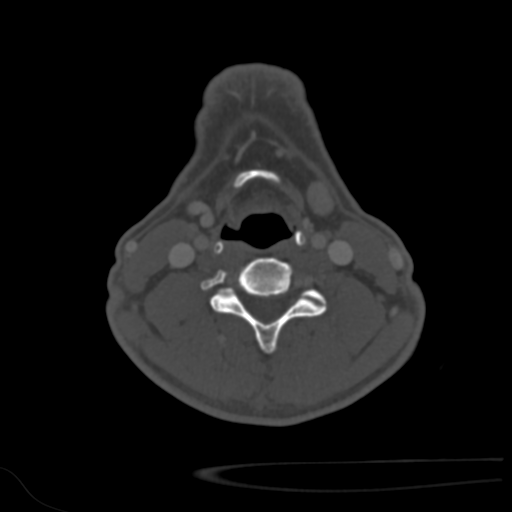
[im 31/179  bone]
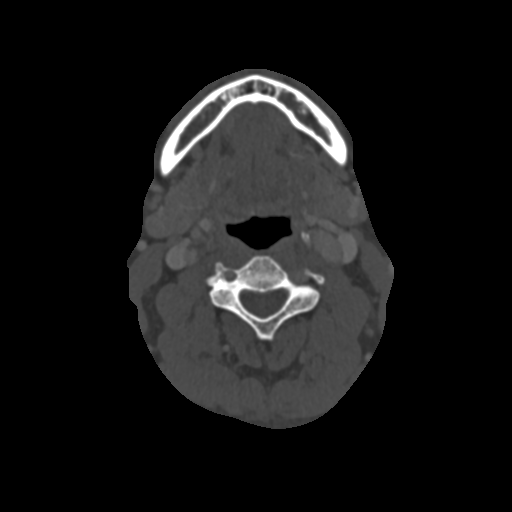
[im 50/179  bone]
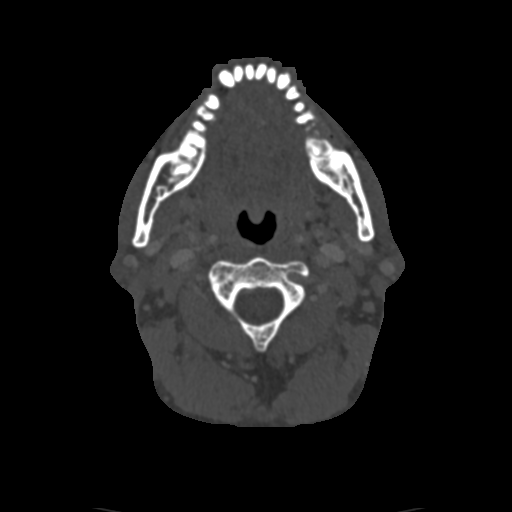
[im 68/179  bone]
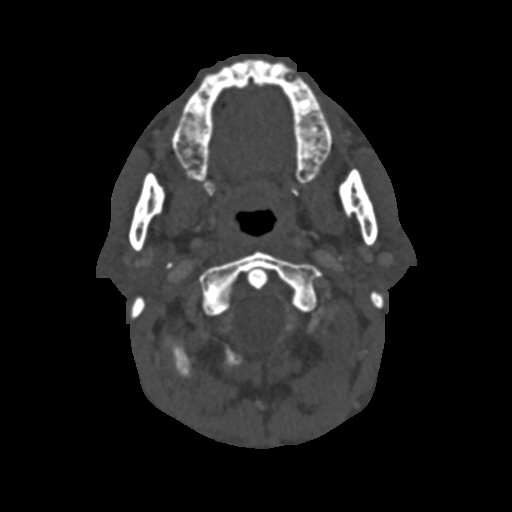
[im 93/179  brain]
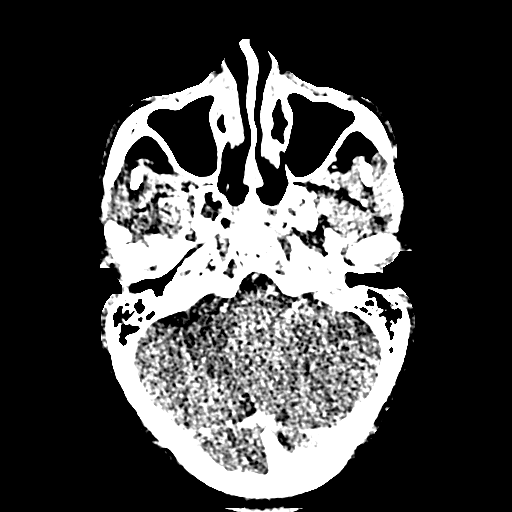
[im 93/179  bone]
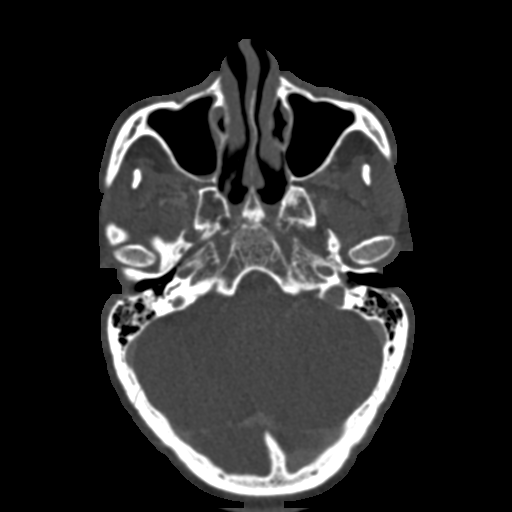
[im 111/179  bone]
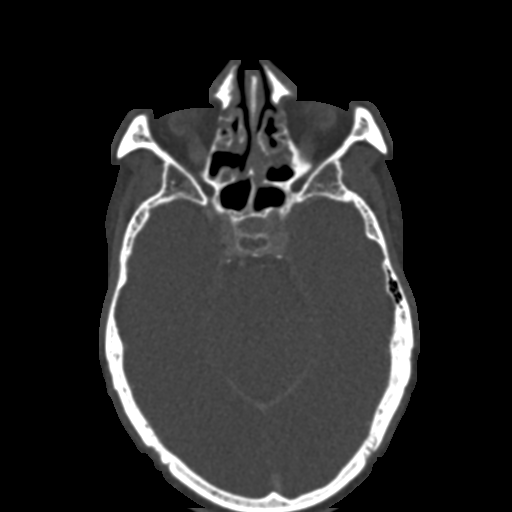
[im 129/179  bone]
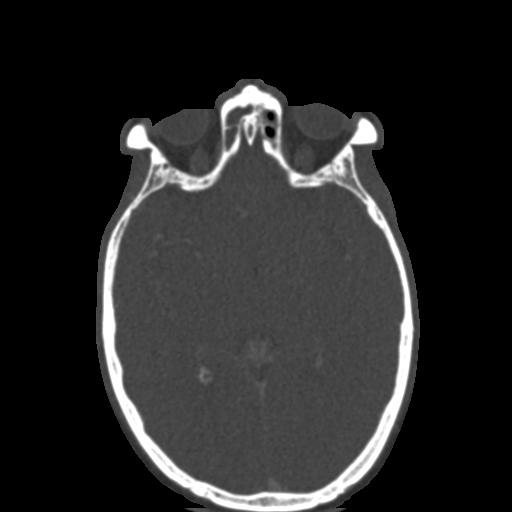
[im 148/179  bone]
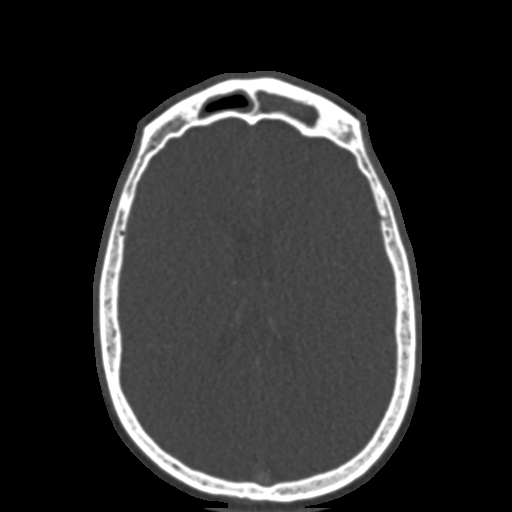
[im 166/179  brain]
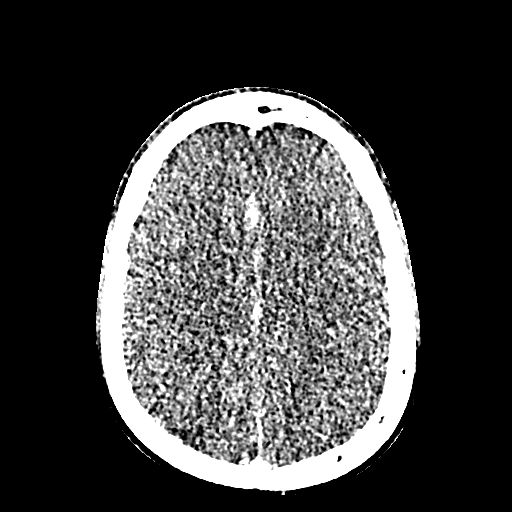
[im 166/179  bone]
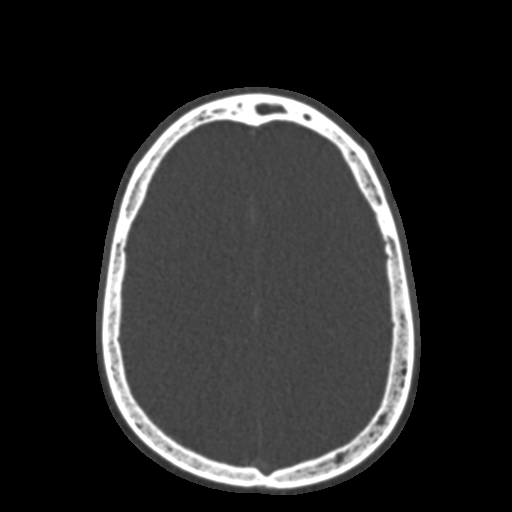

[Series 5: coronal sinus · coronal · 0.38mm/px · 3 of 101 slices shown]
[im 34/101  bone]
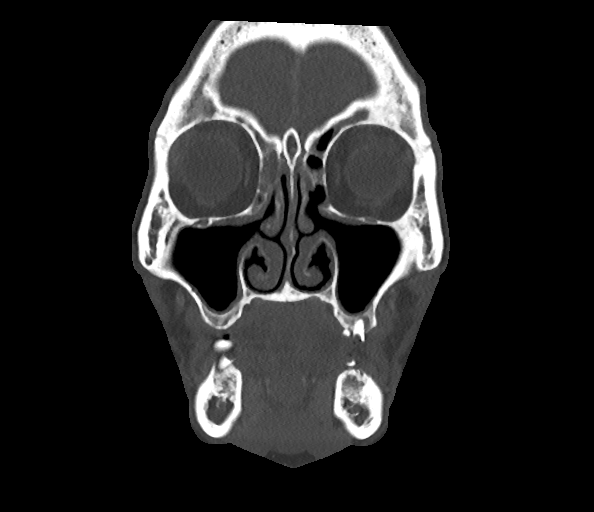
[im 45/101  bone]
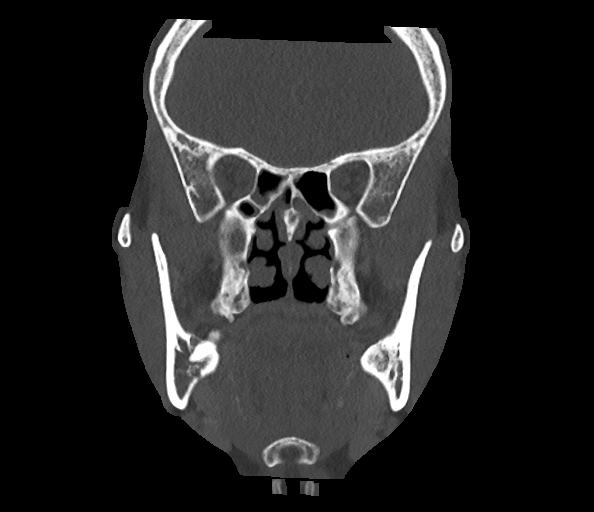
[im 56/101  bone]
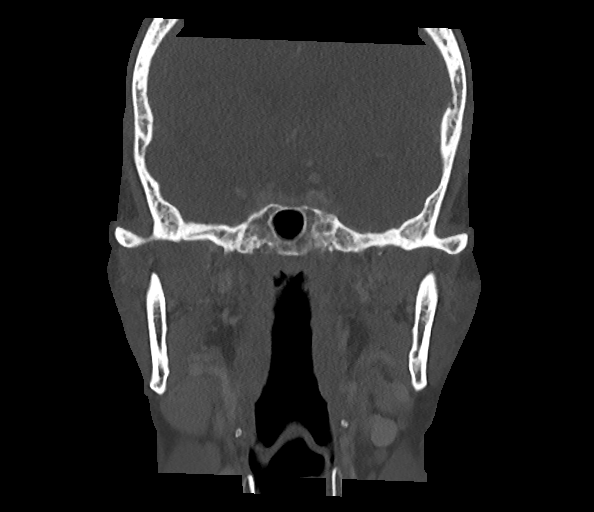

[Series 6: sagittal sinus · sagittal · 0.35mm/px · 3 of 101 slices shown]
[im 34/101  bone]
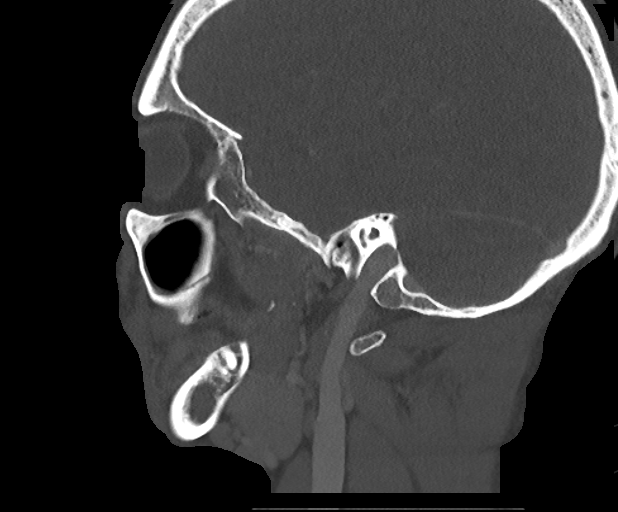
[im 51/101  bone]
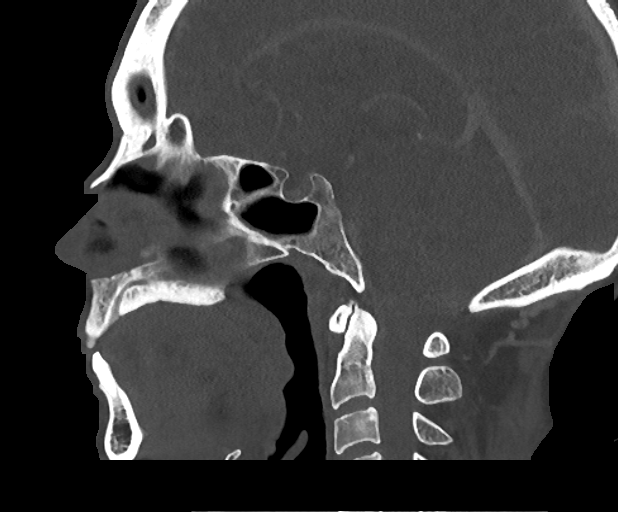
[im 67/101  bone]
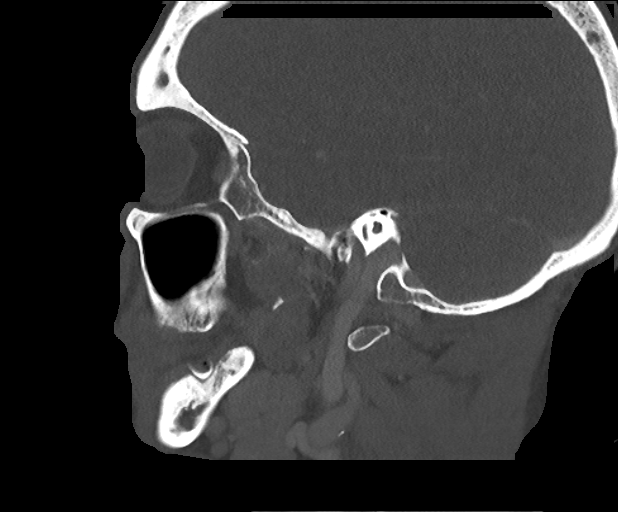

[15 of 47 positions shown; findings below may reference images not displayed]

FINDINGS: Osseous: Negative for fracture or aggressive process. There is
severe dental disease with multiple periapical erosions. Condensing
osteitis seen in the posterior left mandible.

Orbits: Negative

Sinuses: Chronic opacification of the left frontal sinus with wall
sclerosis. Moderate mucosal thickening in the right frontal sinus,
progressed and effacing the frontal ethmoidal recess.

Patchy mucosal thickening in bilateral ethmoid air cells.

Mucosal thickening effaces the bilateral sphenoid sinus ostia. There
is improved aeration of the left sinus compared to prior.

Bilateral medial maxillary antrostomies are widely patent. Mucosal
thickening in the maxillary sinuses is minimal.

The nasal cavity is patent.  No significant septal deviation.

New partial left middle ear opacification. Partial left mastoid
opacification

Soft tissues: Mild atherosclerotic calcification of the carotids.

Limited intracranial: Negative
IMPRESSION: 1. Generalized chronic sinusitis with worst opacification in the
frontal sinuses. Right frontal sinus opacification has progressed
from [QK]; left sphenoid sinus aeration is improved.
2. Bilateral medial maxillary antrostomy that is widely patent.
3. Severe extent of dental cavities with periapical erosions.
4. Partial left mastoid and middle ear opacification, new from [QK].

## 2018-07-06 MED ORDER — IOHEXOL 300 MG/ML  SOLN
100.0000 mL | Freq: Once | INTRAMUSCULAR | Status: AC | PRN
  Administered 2018-07-06: 100 mL via INTRAVENOUS

## 2018-07-11 ENCOUNTER — Ambulatory Visit (INDEPENDENT_AMBULATORY_CARE_PROVIDER_SITE_OTHER): Payer: Self-pay | Admitting: Internal Medicine

## 2018-07-11 ENCOUNTER — Other Ambulatory Visit: Payer: Self-pay

## 2018-07-11 ENCOUNTER — Encounter: Payer: Self-pay | Admitting: Internal Medicine

## 2018-07-11 DIAGNOSIS — I5033 Acute on chronic diastolic (congestive) heart failure: Secondary | ICD-10-CM

## 2018-07-11 DIAGNOSIS — J329 Chronic sinusitis, unspecified: Secondary | ICD-10-CM

## 2018-07-11 DIAGNOSIS — Z79899 Other long term (current) drug therapy: Secondary | ICD-10-CM

## 2018-07-11 DIAGNOSIS — I110 Hypertensive heart disease with heart failure: Secondary | ICD-10-CM

## 2018-07-11 DIAGNOSIS — I10 Essential (primary) hypertension: Secondary | ICD-10-CM

## 2018-07-11 MED ORDER — LISINOPRIL 40 MG PO TABS: 40.0000 mg | ORAL_TABLET | Freq: Every day | ORAL | 3 refills | Status: DC

## 2018-07-11 MED ORDER — LORATADINE 10 MG PO TABS: 10.0000 mg | ORAL_TABLET | Freq: Every day | ORAL | 3 refills | Status: DC

## 2018-07-11 MED ORDER — FLUTICASONE PROPIONATE 50 MCG/ACT NA SUSP: 2.0000 | Freq: Every day | NASAL | 0 refills | Status: DC

## 2018-07-11 NOTE — Assessment & Plan Note (Signed)
No sign of volume overload.  Denies any exertional dyspnea.  No PND or orthopnea.

## 2018-07-11 NOTE — Assessment & Plan Note (Signed)
Patient continued to experience symptoms of postnasal drip, intermittent sore throat and sinus congestion. CT sinus is consistent with opacities secondary to chronic sinusitis and inflammation.  He was given a referral to ENT, waiting for his slot as patient has orange card.  We are hopeful that he will be seen by an ENT specialist soon. Gave him a refill for Claritin and Flonase, patient was not using that because of the cost.  According to him he will try using it if it will cost him less than $20.

## 2018-07-11 NOTE — Patient Instructions (Signed)
Thank you for visiting clinic today. As we discussed please continue taking your medications which include your allergy medicine and nasal spray, until you were seen at an ENT clinic. Please take your medicine from your pharmacy at [facility]. Please follow-up in 77-month.

## 2018-07-11 NOTE — Assessment & Plan Note (Signed)
BP Readings from Last 3 Encounters:  07/11/18 139/89  06/10/18 (!) 148/92  05/12/18 (!) 149/84   His blood pressure was mildly elevated.  His lisinopril was finished for the past 2 days, never got any refills yet.  Patient was advised to get his medicine from pharmacy and restart taking it. Continue lisinopril at 40 mg daily.

## 2018-07-11 NOTE — Progress Notes (Signed)
   CC: For follow-up of his hypertension and chronic sinusitis.  HPI:  Eric Ingram is a 57 y.o. with past medical history as listed below came to the clinic for follow-up of his hypertension and chronic sinusitis.  Please see assessment and plan for his chronic conditions.  Past Medical History:  Diagnosis Date  . Anemia   . CHF (congestive heart failure) ([place])   . GERD (gastroesophageal reflux disease)   . Hypertension    Review of Systems: Active except mentioned in HPI.  Physical Exam:  Vitals:   07/11/18 1439  BP: 139/89  Pulse: 85  Temp: 99 F (37.2 C)  TempSrc: Oral  SpO2: 99%  Weight: 133 lb 11.2 oz (60.6 kg)  Height: 5\' 2"  (1.575 m)    General: Vital signs reviewed.  Patient is well-developed and well-nourished, in no acute distress and cooperative with exam.  HEENT: Normocephalic and atraumatic.  EOMI, conjunctivae normal, no scleral icterus. No facial tenderness, no pharyngeal edema, erythema or exudate. Cardiovascular: RRR, S1 normal, S2 normal, no murmurs, gallops, or rubs. Pulmonary/Chest: Clear to auscultation bilaterally, no wheezes, rales, or rhonchi. Abdominal: Soft, non-tender, non-distended, BS +, no masses, organomegaly, or guarding present.  Extremities: No lower extremity edema bilaterally,  pulses symmetric and intact bilaterally. No cyanosis or clubbing. Skin: Warm, dry and intact. No rashes or erythema. Psychiatric: Normal mood and affect. speech and behavior is normal. Cognition and memory are normal.  Assessment & Plan:   See Encounters Tab for problem based charting.  Patient discussed with Dr. Daryll Drown

## 2018-07-14 NOTE — Progress Notes (Signed)
Internal Medicine Clinic Attending  Case discussed with Dr. Amin at the time of the visit.  We reviewed the resident's history and exam and pertinent patient test results.  I agree with the assessment, diagnosis, and plan of care documented in the resident's note.    

## 2018-07-20 NOTE — Congregational Nurse Program (Signed)
Congregational Nurse Program Note  Date of Encounter: 07/20/2018  Past Medical History: Past Medical History:  Diagnosis Date  . Anemia   . CHF (congestive heart failure) ([place])   . GERD (gastroesophageal reflux disease)   . Hypertension     Encounter Details:  CN office visit with interpreter Diu Hartshorn assisting.  Patient brought copy of [facility] financial assistance letter stating he has 100% discount and his orange card. We helped him call [facility] and faxed copy of letter to them regarding his bill.  His PCP office had given name of Dr. Radene Journey ENT for nasal/sinus problem.  Phone call to Dr. Pollie Friar office.  They have already seen their 1 allotted orange card patient for this month. CN spoke with Sande Rives at  [facility] who will work on getting him a specialist appointment thru [place].  Jake Michaelis RN, Congregational Nurse [phone] CNP Questionnaire - 07/20/18 1841      Questionnaire   Patient Status  Refugee    Race  Asian    Location Patient Served At  Not Applicable    Insurance  Not Applicable    Uninsured  Uninsured (Subsequent visits/quarter)    Food  No food insecurities    Housing/Utilities  Yes, have permanent housing    Transportation  No transportation needs    Interpersonal Safety  Yes, feel physically and emotionally safe where you currently live    Medication  No medication insecurities    Medical Provider  Yes    Referrals  Other    ED Visit Averted  Not Applicable    Life-Saving Intervention Made  Not Applicable      Clinical Intake - 07/11/18 1441      Pre-visit preparation   Pre-visit preparation completed  Yes      Pain   Pain   0-10    Pain Score  9     Pain Type  Chronic pain    Pain Location  Nose    Pain Orientation  Right;Left    Pain Radiating Towards  goes to Sinuses and congestion     Pain Descriptors / Indicators  Aching    Pain Onset  More than a month ago    Pain Frequency  Intermittent    Pain Relieving  Factors  nothing     Effect of Pain on Daily Activities  feels heavy in the face       Nutrition Screen   BMI - recorded  24.45    Nutritional Status  BMI of 19-24  Normal    Nutritional Risks  None    Diabetes  No      Functional Status   Activities of Daily Living  Independent    Ambulation  Independent    Medication Administration  Independent    Home Management  Independent      Risk/Barriers   Barriers to Care Management & Learning  Language [place] interpreter    [place] interpreter      Abuse/Neglect   Do you feel unsafe in your current relationship?  No    Do you feel physically threatened by others?  No    Anyone hurting you at home, work, or school?  No    Unable to ask?  No    Information provided on Community resources  No      Patient Literacy   How often do you need to have someone help you when you read instructions, pamphlets, or other written materials from your doctor  or pharmacy?  5 - Always    What is the last grade level you completed in school?  4th Grade       Language Assistant   Interpreter Needed?  Yes    Interpreter Agency  [employer]     Interpreter Name  [place]     Patient Declined Interpreter   No    Patient signed [facility] waiver  No      Comments   Comments  Congestion     Information entered by :  Sander Nephew, RN 07/11/2018 2;47 PM

## 2018-08-02 ENCOUNTER — Other Ambulatory Visit: Payer: Self-pay

## 2018-08-02 ENCOUNTER — Ambulatory Visit (INDEPENDENT_AMBULATORY_CARE_PROVIDER_SITE_OTHER): Payer: Self-pay | Admitting: Internal Medicine

## 2018-08-02 ENCOUNTER — Encounter: Payer: Self-pay | Admitting: Internal Medicine

## 2018-08-02 VITALS — BP 114/75 | HR 86 | Temp 98.8°F | Ht 62.0 in | Wt 133.3 lb

## 2018-08-02 DIAGNOSIS — R591 Generalized enlarged lymph nodes: Secondary | ICD-10-CM

## 2018-08-02 DIAGNOSIS — K047 Periapical abscess without sinus: Secondary | ICD-10-CM

## 2018-08-02 DIAGNOSIS — J329 Chronic sinusitis, unspecified: Secondary | ICD-10-CM

## 2018-08-02 DIAGNOSIS — I110 Hypertensive heart disease with heart failure: Secondary | ICD-10-CM

## 2018-08-02 DIAGNOSIS — I5033 Acute on chronic diastolic (congestive) heart failure: Secondary | ICD-10-CM

## 2018-08-02 HISTORY — DX: Periapical abscess without sinus: K04.7

## 2018-08-02 MED ORDER — AMOXICILLIN 500 MG PO TABS: 500.0000 mg | ORAL_TABLET | Freq: Two times a day (BID) | ORAL | 0 refills | Status: AC

## 2018-08-02 NOTE — Assessment & Plan Note (Signed)
The patient presented with a four-day history of dental pain.  He describes the pain as 10/10 intensity, constant in nature, worsened at nighttime.  He states that the pain is worst at the right upper molar and right lower molar region.    He is lost his right upper molar 2 years ago spontaneously.  He has had difficulty chewing, bilateral ear pain, has been spitting up yellow mucus.  He denies any fevers, difficulty swallowing, hoarseness, swelling facial swelling, bleeding from his gingiva  Assessment and plan We will treat the patient's dental infection with amoxicillin 500 mg twice daily for 5 days.  Made referral for dental clinic.  Encouraged the patient to use Tylenol for pain.  Return to clinic as necessary if he notes a fever or severe pain.  She also continues to complain of pain in his nose and sinuses.  The patient still needs to get ENT referral per his PCPs note.

## 2018-08-02 NOTE — Patient Instructions (Addendum)
It was a pleasure to see you today Eric Ingram. I am sorry to hear about your tooth pain.   We will make a referral to the dental clinic for you  Please start using amoxicillin 500mg  two times daily for 5 days  If you have any questions or concerns, please call our clinic at [phone] between 9am-5pm and after hours call [phone] and ask for the internal medicine resident on call. If you feel you are having a medical emergency please call 911.   Thank you, we look forward to help you remain healthy!  Lars Mage, MD Internal Medicine PGY2

## 2018-08-02 NOTE — Progress Notes (Signed)
   CC: Tooth Pain  HPI:  [name] is a 57 y.o. hypertension, acute on chronic diastolic heart failure, chronic sinusitis, mesenteric lymphadenopathy for tooth pain. Please see problem based charting for evaluation, assessment, and plan.   Past Medical History:  Diagnosis Date  . Anemia   . CHF (congestive heart failure) ([place])   . GERD (gastroesophageal reflux disease)   . Hypertension    Review of Systems:    Has difficulty chewing, bilateral ear pain, yellow mucus that he is spitting up Denies difficulty swallowing, hoarseness, facial swelling, bleeding from his gums  Physical Exam:  Vitals:   08/02/18 1532  BP: 114/75  Pulse: 86  Temp: 98.8 F (37.1 C)  TempSrc: Oral  SpO2: 99%  Weight: 133 lb 4.8 oz (60.5 kg)  Height: 5\' 2"  (1.575 m)   Physical Exam  Constitutional: He appears well-developed and well-nourished. No distress.  HENT:  Head: Normocephalic and atraumatic.  Mouth/Throat: Oropharynx is clear and moist.  Several upper and lower teeth missing.  Upper molar with greenish-black plaque buildup.  No erythema on gingival line noted.  No bleeding or discharge noted  Eyes: Conjunctivae are normal.  Cardiovascular: Normal rate, regular rhythm and normal heart sounds.  Respiratory: Effort normal and breath sounds normal. No respiratory distress. He has no wheezes.  GI: Soft. Bowel sounds are normal. He exhibits no distension. There is no tenderness.  Musculoskeletal: He exhibits no edema.  Neurological: He is alert.  Skin: He is not diaphoretic. No erythema.  Psychiatric: He has a normal mood and affect. His behavior is normal. Thought content normal.    Assessment & Plan:   See Encounters Tab for problem based charting.  Dental infection   The patient presented with a four-day history of dental pain.  He describes the pain as 10/10 intensity, constant in nature, worsened at nighttime.  He states that the pain is worst at the right upper molar and right  lower molar region.    He is lost his right upper molar 2 years ago spontaneously.  He has had difficulty chewing, bilateral ear pain, has been spitting up yellow mucus.  He denies any fevers, difficulty swallowing, hoarseness, swelling facial swelling, bleeding from his gingiva  Assessment and plan We will treat the patient's dental infection with amoxicillin 500 mg twice daily for 5 days.  Made referral for dental clinic.  Encouraged the patient to use Tylenol for pain.  Return to clinic as necessary if he notes a fever or severe pain.  She also continues to complain of pain in his nose and sinuses.  The patient still needs to get ENT referral per his PCPs note.  Patient discussed with Dr. Dareen Piano

## 2018-08-03 NOTE — Congregational Nurse Program (Signed)
CN office visit with interpreter Diu Hartshorn assisting.   Informed patient that Sande Rives from [facility] called to say that he has been scheduled for an ENT appointment with doctor Radene Journey on August 29 at 1:00 pm. Gave patient written information including address and appointment information.  Advised patient  to take someone with him to help with paper work and interpretation. Also assisted him with bills by providing [place] discount information to [place] he was seen by his PCP yesterday and given amoxicillin for a toothache.  Awaiting dental referral by PCP thru orange card program.  Jake Michaelis RN, Congregational Nurse [phone]

## 2018-08-09 ENCOUNTER — Encounter: Payer: Self-pay | Admitting: *Deleted

## 2018-08-10 NOTE — Progress Notes (Signed)
Internal Medicine Clinic Attending  Case discussed with Dr. Chundi at the time of the visit.  We reviewed the resident's history and exam and pertinent patient test results.  I agree with the assessment, diagnosis, and plan of care documented in the resident's note. 

## 2018-08-18 ENCOUNTER — Telehealth: Payer: Self-pay

## 2018-08-18 NOTE — Telephone Encounter (Signed)
Phone call from [place] at [facility].  Scheduled patient for September 05, 2018 at 10:00 am.  The current address is [address]  Jake Michaelis RN, Congregational Nurse [phone]

## 2018-08-19 ENCOUNTER — Other Ambulatory Visit (INDEPENDENT_AMBULATORY_CARE_PROVIDER_SITE_OTHER): Payer: Self-pay

## 2018-08-19 ENCOUNTER — Encounter (INDEPENDENT_AMBULATORY_CARE_PROVIDER_SITE_OTHER): Payer: Self-pay

## 2018-08-19 ENCOUNTER — Encounter: Payer: Self-pay | Admitting: Gastroenterology

## 2018-08-19 ENCOUNTER — Ambulatory Visit (INDEPENDENT_AMBULATORY_CARE_PROVIDER_SITE_OTHER): Payer: Self-pay | Admitting: Gastroenterology

## 2018-08-19 VITALS — BP 122/74 | HR 81 | Ht 61.42 in | Wt 135.0 lb

## 2018-08-19 DIAGNOSIS — K5900 Constipation, unspecified: Secondary | ICD-10-CM

## 2018-08-19 DIAGNOSIS — D509 Iron deficiency anemia, unspecified: Secondary | ICD-10-CM

## 2018-08-19 DIAGNOSIS — R935 Abnormal findings on diagnostic imaging of other abdominal regions, including retroperitoneum: Secondary | ICD-10-CM

## 2018-08-19 LAB — CBC WITH DIFFERENTIAL/PLATELET
BASOS ABS: 0.1 10*3/uL (ref 0.0–0.1)
Basophils Relative: 0.9 % (ref 0.0–3.0)
Eosinophils Absolute: 0.7 10*3/uL (ref 0.0–0.7)
Eosinophils Relative: 9 % — ABNORMAL HIGH (ref 0.0–5.0)
HCT: 36.9 % — ABNORMAL LOW (ref 39.0–52.0)
HEMOGLOBIN: 12.2 g/dL — AB (ref 13.0–17.0)
LYMPHS ABS: 1.7 10*3/uL (ref 0.7–4.0)
Lymphocytes Relative: 22.9 % (ref 12.0–46.0)
MCHC: 33.2 g/dL (ref 30.0–36.0)
MCV: 68.7 fl — ABNORMAL LOW (ref 78.0–100.0)
MONO ABS: 0.5 10*3/uL (ref 0.1–1.0)
MONOS PCT: 7.1 % (ref 3.0–12.0)
NEUTROS PCT: 60.1 % (ref 43.0–77.0)
Neutro Abs: 4.6 10*3/uL (ref 1.4–7.7)
Platelets: 326 10*3/uL (ref 150.0–400.0)
RBC: 5.37 Mil/uL (ref 4.22–5.81)
RDW: 15.2 % (ref 11.5–15.5)
WBC: 7.6 10*3/uL (ref 4.0–10.5)

## 2018-08-19 LAB — FERRITIN: FERRITIN: 30.8 ng/mL (ref 22.0–322.0)

## 2018-08-19 NOTE — Patient Instructions (Signed)
If you are age 57 or older, your body mass index should be between 23-30. Your Body mass index is 25.16 kg/m. If this is out of the aforementioned range listed, please consider follow up with your Primary Care Provider.  If you are age 64 or younger, your body mass index should be between 19-25. Your Body mass index is 25.16 kg/m. If this is out of the aformentioned range listed, please consider follow up with your Primary Care Provider.   Your provider has requested that you go to the basement level for lab work before leaving today. Press "B" on the elevator. The lab is located at the first door on the left as you exit the elevator.   You have been scheduled for a CT scan of the abdomen and pelvis at [facility] ([address]---this is in the same building as [facility]).   You are scheduled on 08/30/18 at 11:30 am. You should arrive 15 minutes prior to your appointment time for registration. Please follow the written instructions below on the day of your exam:  WARNING: IF YOU ARE ALLERGIC TO IODINE/X-RAY DYE, PLEASE NOTIFY RADIOLOGY IMMEDIATELY AT [phone]! YOU WILL BE GIVEN A 13 HOUR PREMEDICATION PREP.  1) Do not eat anything after 7:30 am (4 hours prior to your test) 2) You have been given 2 bottles of oral contrast to drink. The solution may taste better if refrigerated, but do NOT add ice or any other liquid to this solution. Shake well before drinking.    Drink 1 bottle of contrast @ 9:30 am (2 hours prior to your exam)  Drink 1 bottle of contrast @ 10:30 am (1 hour prior to your exam)  You may take any medications as prescribed with a small amount of water except for the following: Metformin, Glucophage, Glucovance, Avandamet, Riomet, Fortamet, Actoplus Met, Janumet, Glumetza or Metaglip. The above medications must be held the day of the exam AND 48 hours after the exam.  The purpose of you drinking the oral contrast is to aid in the visualization of your  intestinal tract. The contrast solution may cause some diarrhea. Before your exam is started, you will be given a small amount of fluid to drink. Depending on your individual set of symptoms, you may also receive an intravenous injection of x-ray contrast/dye. Plan on being at [facility] for 30 minutes or longer, depending on the type of exam you are having performed.  This test typically takes 30-45 minutes to complete.  If you have any questions regarding your exam or if you need to reschedule, you may call the CT department at [phone] between the hours of 8:00 am and 5:00 pm, Monday-Friday.  ______________________________________________________________________  Start over-the-counter Miralax - take once daily.  Thank you for choosing me and [facility].   Steven Armbruster, MD  

## 2018-08-19 NOTE — Progress Notes (Signed)
HPI :  57 y/o male here for a follow up visit for iron deficiency and abnormal CT scan. He has a chronic microcytic anemia dating back to 2011 or so. He had a Hgb of 11s with MCV of 60s and ferritin of 20 back in December. He underwent EGD and colonoscopy since we have last seen him:  EGD 02/11/2018 - small esophageal diverticulum, gastritis / gastric polyp, biopsies negative for H p Colonoscopy 02/11/2018 - normal ileum, 68mm polyp ascending colon - sessile serrated, diverticulosis, hemorrhoids - recall 2024  He was given protonix for gastritis and given iron supplementation. Last Hgb in March had increased to 12.3.  History obtained through translator today. He stopped oral iron, unclear how long he has been off that. He does take protonix. He denies NSAID use. Generally he has been feeling well, no weight loss. He does have some constipation which bothers him a few times per week. When he is constipated he endorses some bloating which bothers him. He is not taking anything for his bowels. No blood in the stools. He denies any heartburn / reflux / dysphagia / upper abdominal pain / nausea / vomiting, he appears to be eating okay.  He previously had a CT scan in February showing normal small bowel but nonspecific mesenteric lymphadenopathy - radiology recommend interval repeat CT scan of the abdomen / pelvis to reassess this which has not yet been done.   Past Medical History:  Diagnosis Date  . Anemia   . CHF (congestive heart failure) ([place])   . GERD (gastroesophageal reflux disease)   . Hypertension      Past Surgical History:  Procedure Laterality Date  . NO PAST SURGERIES     Family History  Problem Relation Age of Onset  . Colon cancer Neg Hx   . Stomach cancer Neg Hx    Social History   Tobacco Use  . Smoking status: Former Research scientist (life sciences)  . Smokeless tobacco: Never Used  Substance Use Topics  . Alcohol use: No    Frequency: Never    Comment: former  . Drug use: No   Current  Outpatient Medications  Medication Sig Dispense Refill  . fluticasone (FLONASE) 50 MCG/ACT nasal spray Place 2 sprays into both nostrils daily. 1 g 0  . lisinopril (PRINIVIL,ZESTRIL) 40 MG tablet Take 1 tablet (40 mg total) by mouth daily. 90 tablet 3  . loratadine (CLARITIN) 10 MG tablet Take 1 tablet (10 mg total) by mouth daily. 90 tablet 3  . pantoprazole (PROTONIX) 40 MG tablet Take 1 tablet (40 mg total) by mouth daily. 90 tablet 2   No current facility-administered medications for this visit.    No Known Allergies   Review of Systems: All systems reviewed and negative except where noted in HPI.   Labs as above  Physical Exam: BP 122/74   Pulse 81   Ht 5' 1.42" (1.56 m) Comment: w/o shoes  Wt 135 lb (61.2 kg)   BMI 25.16 kg/m  Constitutional: Pleasant,well-developed, male in no acute distress. HEENT: Normocephalic and atraumatic. Conjunctivae are normal. No scleral icterus. Neck supple.  Cardiovascular: Normal rate, regular rhythm.  Pulmonary/chest: Effort normal and breath sounds normal. No wheezing, rales or rhonchi. Abdominal: Soft, nondistended, nontender. there are no masses palpable. No hepatomegaly. Extremities: no edema Lymphadenopathy: No cervical adenopathy noted. Neurological: Alert and oriented to person place and time. Skin: Skin is warm and dry. No rashes noted. Psychiatric: Normal mood and affect. Behavior is normal.   ASSESSMENT AND  PLAN: 57 year old male here for reassessment of the following issues:  Iron deficiency anemia - anemia is long-standing, some gastritis noted on EGD, otherwise no significant pathology noted on EGD and colonoscopy. He was supposed to be on oral iron and have follow-up CBC, this was not done. We'll recheck a CBC at this point along with iron studies to see if his anemia persists after treatment of gastritis. If it does, we may recommend a capsule endoscopy to clear the small bowel, although we'll first await results of CT as  below. We'll place back on oral iron if anemia persistent. He agreed  Abnormal CT scan - numerous mesenteric and retroperitoneal lymph nodes, with nonspecific fat stranding along the bilateral iliac chains, done back in February. Radiology had recommended a follow up imaging in 3-6 months. While I suspect his bloating is due to constipation as below, will proceed with interval CT scan to ensure okay. He agreed.   Constipation / bloating - will try Miralax once to twice daily, titrate as needed. If symptoms persist, he should contact me for reassessment.  [place] Cellar, MD [facility]  CC: Lorella Nimrod, MD

## 2018-08-20 LAB — IRON AND TIBC
Iron Saturation: 36 % (ref 15–55)
Iron: 99 ug/dL (ref 38–169)
TIBC: 273 ug/dL (ref 250–450)
UIBC: 174 ug/dL (ref 111–343)

## 2018-08-25 ENCOUNTER — Other Ambulatory Visit: Payer: Self-pay

## 2018-08-25 DIAGNOSIS — D509 Iron deficiency anemia, unspecified: Secondary | ICD-10-CM

## 2018-08-30 ENCOUNTER — Ambulatory Visit (INDEPENDENT_AMBULATORY_CARE_PROVIDER_SITE_OTHER)
Admission: RE | Admit: 2018-08-30 | Discharge: 2018-08-30 | Disposition: A | Discharge status: Home / Self Care | Payer: Self-pay | Source: Ambulatory Visit | Attending: Gastroenterology | Admitting: Gastroenterology

## 2018-08-30 DIAGNOSIS — D509 Iron deficiency anemia, unspecified: Secondary | ICD-10-CM

## 2018-08-30 DIAGNOSIS — K5900 Constipation, unspecified: Secondary | ICD-10-CM

## 2018-08-30 DIAGNOSIS — R935 Abnormal findings on diagnostic imaging of other abdominal regions, including retroperitoneum: Secondary | ICD-10-CM

## 2018-08-30 IMAGING — CT CT ABD-PELV W/ CM
2 of 5 series · 15 of 46 positions shown, 17 images · IV contrast (ISOVUE 300)
Comparison: [DATE] CT abdomen/pelvis.

CLINICAL DATA: Iron deficiency anemia. Worsening chronic
constipation, bloating and gas.

EXAM:
CT ABDOMEN AND PELVIS WITH CONTRAST
TECHNIQUE: Multidetector CT imaging of the abdomen and pelvis was performed
using the standard protocol following bolus administration of
intravenous contrast.
CONTRAST:  100mL [X9] IOPAMIDOL ([X9]) INJECTION 61%

[Series 2: abd/pel w · axial · 0.68mm/px · z∈[-427,-27]mm · 12 of 90 slices shown, 14 images]
[im 5/90  soft-tissue]
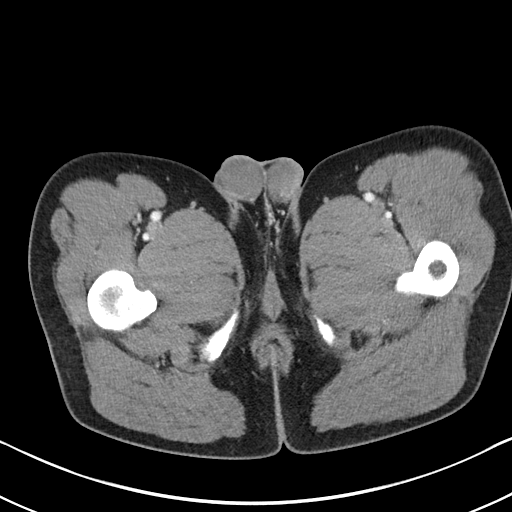
[im 5/90  bone]
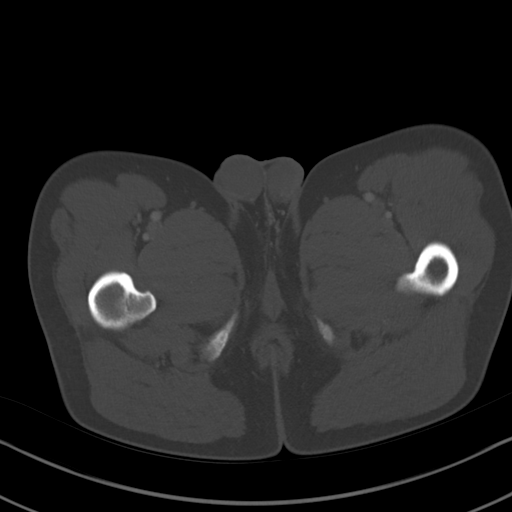
[im 14/90  soft-tissue]
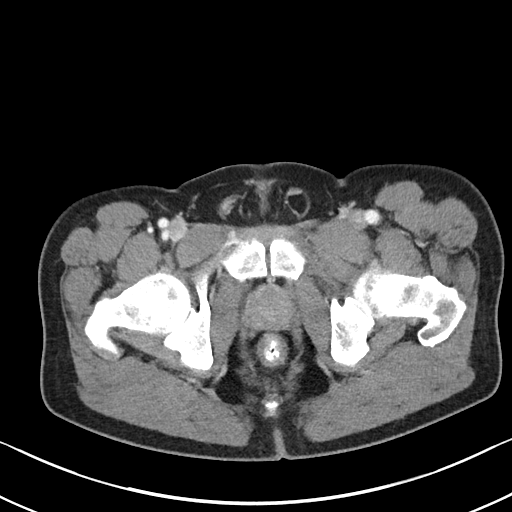
[im 18/90  soft-tissue]
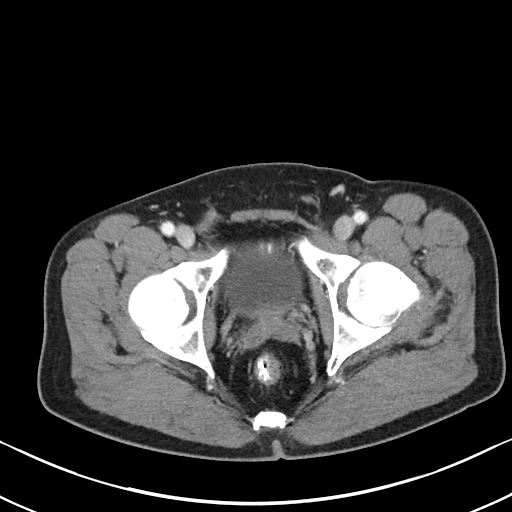
[im 27/90  soft-tissue]
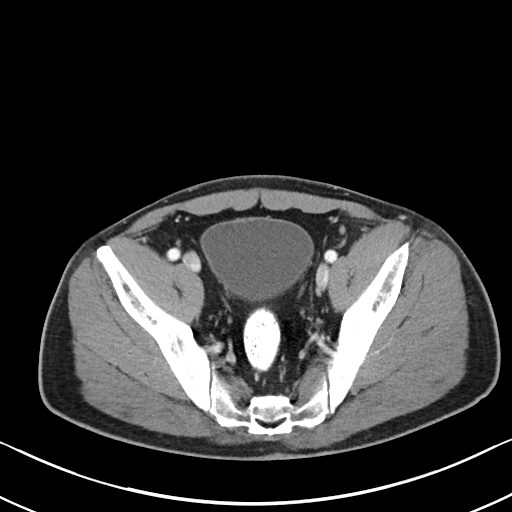
[im 36/90  soft-tissue]
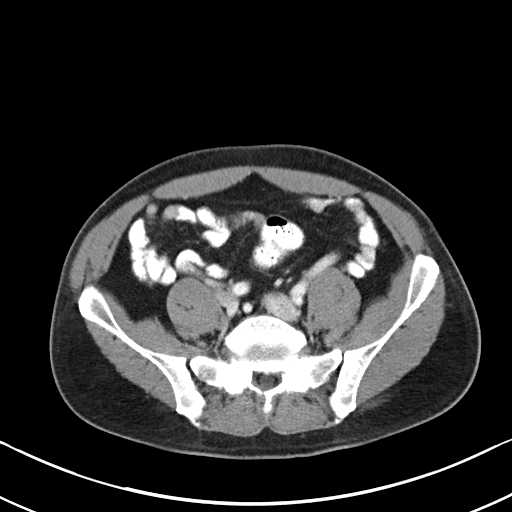
[im 41/90  soft-tissue]
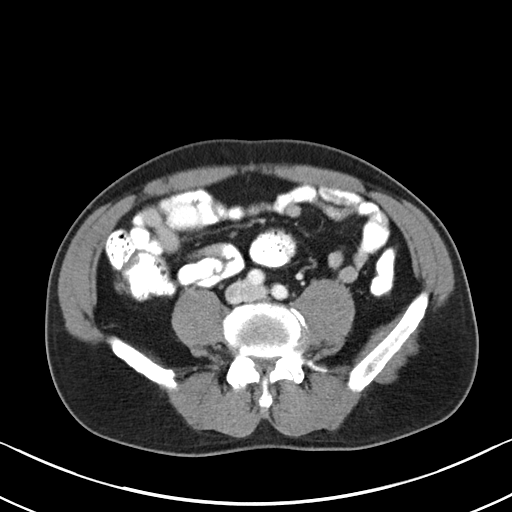
[im 49/90  soft-tissue]
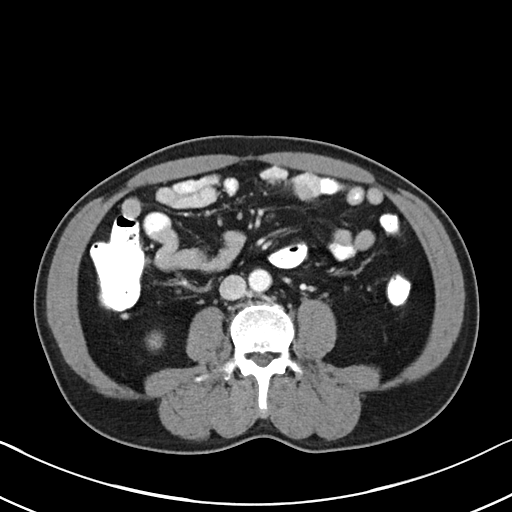
[im 54/90  soft-tissue]
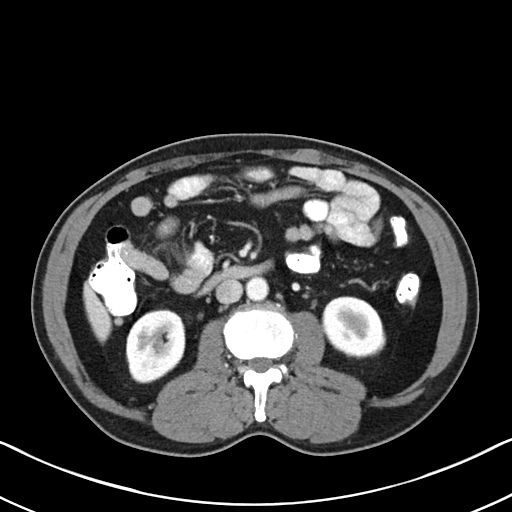
[im 63/90  soft-tissue]
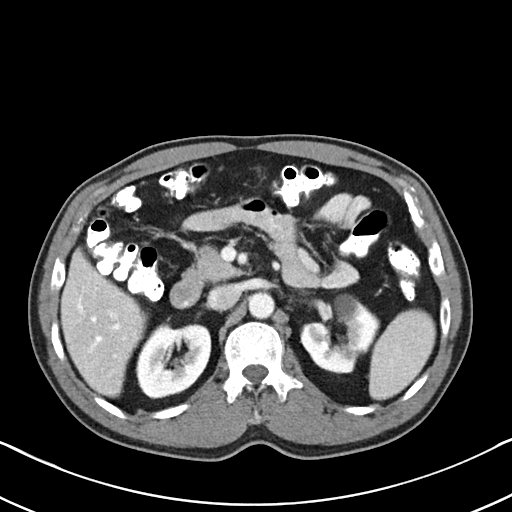
[im 63/90  bone]
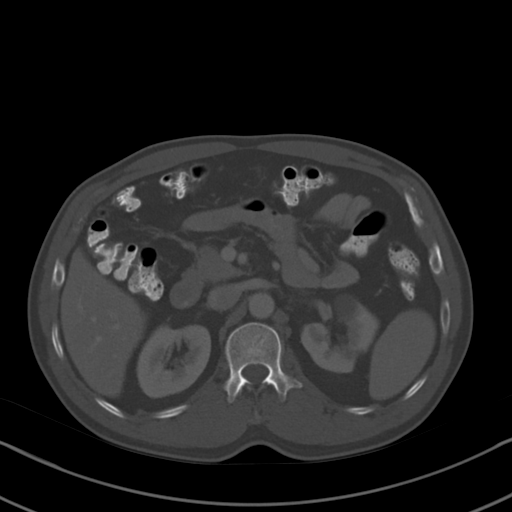
[im 72/90  soft-tissue]
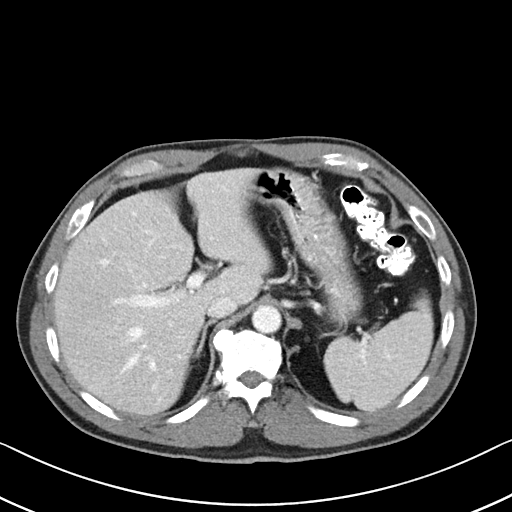
[im 76/90  soft-tissue]
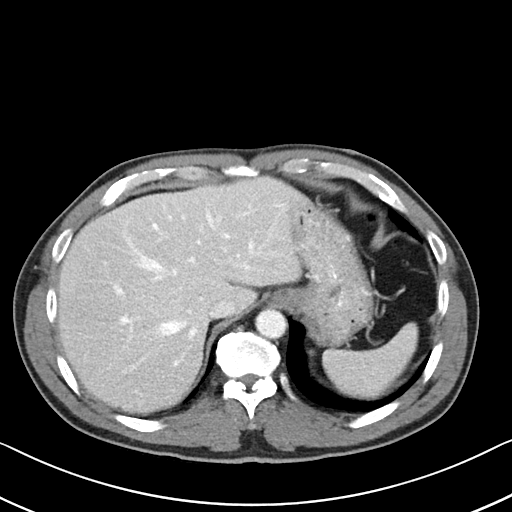
[im 85/90  soft-tissue]
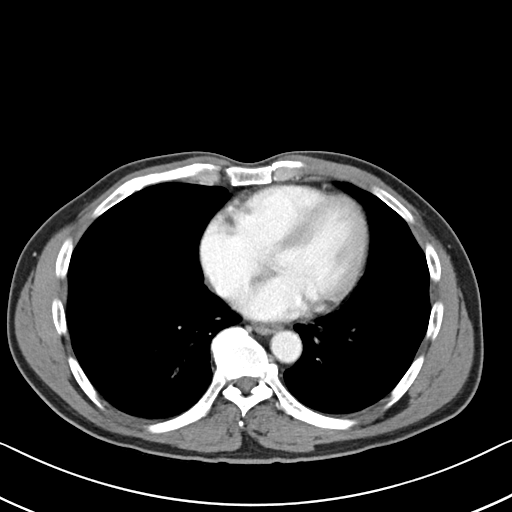

[Series 6: abd/pel w st · coronal · 0.62mm/px · 3 of 71 slices shown]
[im 24/71  soft-tissue]
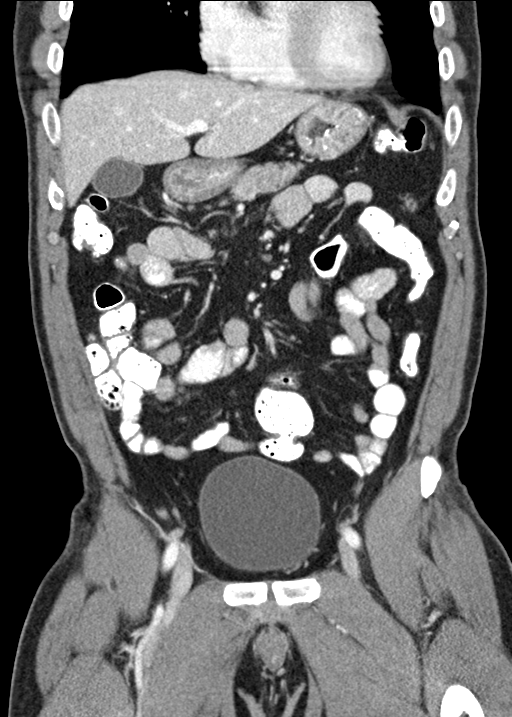
[im 32/71  soft-tissue]
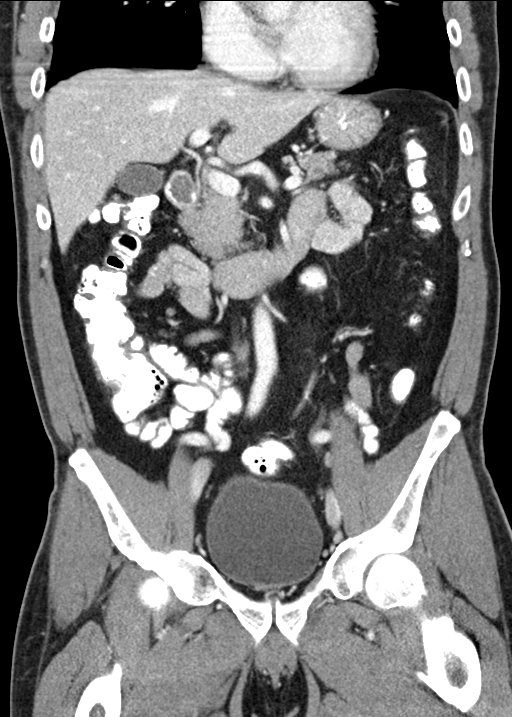
[im 39/71  soft-tissue]
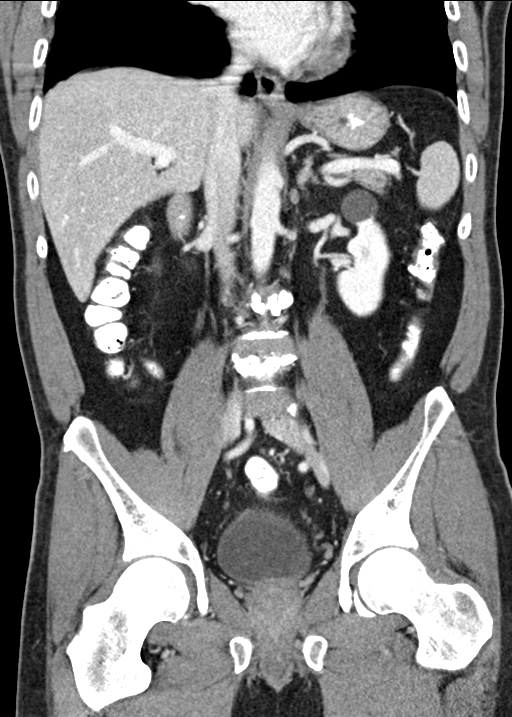

[15 of 46 positions shown; findings below may reference images not displayed]

FINDINGS: Lower chest: No significant pulmonary nodules or acute consolidative
airspace disease.

Hepatobiliary: Normal liver size. No liver mass. Normal gallbladder
with no radiopaque cholelithiasis. No biliary ductal dilatation.

Pancreas: Normal, with no mass or duct dilation.

Spleen: Normal size. No mass.

Adrenals/Urinary Tract: Normal adrenals. Simple 2.2 cm anterior
upper left renal cyst. Stable scarring in the upper left kidney.
Scattered subcentimeter hypodense renal lesions in both kidneys are
too small to characterize and stable, considered benign. No new
renal lesions. No hydronephrosis. Normal bladder.

Stomach/Bowel: Normal non-distended stomach. Normal caliber small
bowel with no small bowel wall thickening. Normal appendix. Oral
contrast transits to the rectum. Normal large bowel with no
diverticulosis, large bowel wall thickening or pericolonic fat
stranding.

Vascular/Lymphatic: Mildly atherosclerotic nonaneurysmal abdominal
aorta. Patent portal, splenic, hepatic and renal veins. No
pathologically enlarged lymph nodes in the abdomen or pelvis.
Previously described fat stranding along the bilateral iliac chains
has resolved.

Reproductive: Normal size prostate.

Other: No pneumoperitoneum, ascites or focal fluid collection.

Musculoskeletal: No aggressive appearing focal osseous lesions.
Moderate thoracolumbar spondylosis.
IMPRESSION: 1. No acute abnormality. No evidence of bowel obstruction or acute
bowel inflammation. Normal appendix.
2. No adenopathy. Previously described fat stranding along the iliac
chains has resolved.
3. Stable scarring in the upper left kidney.
4.  Aortic Atherosclerosis ([X9]-[X9]).

## 2018-08-30 MED ORDER — IOPAMIDOL (ISOVUE-300) INJECTION 61%
100.0000 mL | Freq: Once | INTRAVENOUS | Status: AC | PRN
  Administered 2018-08-30: 100 mL via INTRAVENOUS

## 2018-09-02 ENCOUNTER — Other Ambulatory Visit (INDEPENDENT_AMBULATORY_CARE_PROVIDER_SITE_OTHER): Payer: Self-pay

## 2018-09-02 ENCOUNTER — Other Ambulatory Visit: Payer: Self-pay | Admitting: Internal Medicine

## 2018-09-02 DIAGNOSIS — D500 Iron deficiency anemia secondary to blood loss (chronic): Secondary | ICD-10-CM

## 2018-09-05 NOTE — Congregational Nurse Program (Signed)
Accompanied patient to appointment at [facility] after receiving call from their office stating he showed up without anyone to help him with the paperwork or to interpret.  Completed new patient forms by using information from Epic, talking with his daughter via phone and with the help of interpreter Diu Hartshorn via phone.  Patient was examined and found to have infection for which Amoxicillin was prescribed for 14 days.  Treatment plan explained to patient. All upper teeth need to be extracted and replaced with a full denture.  Several lower teeth will also be extracted and some will be filled. He will need a lower partial. He has a return appointment with Dr. Lovena Le on 09/16/2018 at 9:30 am for extractions.  Talked with patient about getting one of his children to come with him to appointments.  [facility]ompanied [facility] to [facility] to pick up antibiotic because he did not know how to find it.  Jake Michaelis RN, Congregational Nurse [phone]

## 2018-09-14 ENCOUNTER — Telehealth: Payer: Self-pay | Admitting: Gastroenterology

## 2018-09-22 LAB — HEMOGLOBINOPATHY EVALUATION
Ferritin: 49 ng/mL (ref 30–400)
HEMATOCRIT: 39.2 % (ref 37.5–51.0)
HEMOGLOBIN: 12.2 g/dL — AB (ref 13.0–17.7)
HGB A2 QUANT: 4.8 % — AB (ref 1.8–3.2)
HGB C: 0 %
HGB S: 0 %
HGB VARIANT: 95.2 % — AB
Hgb A: 0 % — ABNORMAL LOW (ref 96.4–98.8)
Hgb F Quant: 0 % (ref 0.0–2.0)
Hgb Solubility: NEGATIVE
MCH: 21.9 pg — ABNORMAL LOW (ref 26.6–33.0)
MCHC: 31.1 g/dL — ABNORMAL LOW (ref 31.5–35.7)
MCV: 71 fL — AB (ref 79–97)
Platelets: 386 10*3/uL (ref 150–450)
RBC: 5.56 x10E6/uL (ref 4.14–5.80)
RDW: 16.3 % — ABNORMAL HIGH (ref 12.3–15.4)
WBC: 7.9 10*3/uL (ref 3.4–10.8)

## 2018-09-22 LAB — ALPHA-THALASSEMIA

## 2018-09-23 ENCOUNTER — Telehealth: Payer: Self-pay | Admitting: Gastroenterology

## 2018-09-23 NOTE — Telephone Encounter (Signed)
Follow up lab testing done for hemoglobin electrophoresis and alpha thallasemia, was done under another provider's name, not sure how that happened.  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  Jan he should follow up with his primary care about this issue and they can discuss with him and determine if he needs any other evaluation by hematology, as this is outside our scope of practice, if you can let him know. Thanks

## 2018-09-23 NOTE — Telephone Encounter (Signed)
There is no emergency about these labs. We will discuss with patient during his next follow-up appointment.

## 2018-09-23 NOTE — Telephone Encounter (Signed)
I called [facility], Dr. Latina Craver office who is the pt's PCP.  They have a CMA who speaks [place] (first name sounds like "Mae") who communicates with the pt. I let her know about the lab results that need to be followed up by Dr. Reesa Chew (alpha thalassemia from 09-02-18).  Pt has an appt with Dr. Reesa Chew next month on 11-07-18  She will call the pt today and explain.  Unless you think he needs to be seen by Dr. Latina Craver office sooner than 11-25 for this issue, I requested that the results be addressed at that office visit. Thank you.

## 2018-09-27 ENCOUNTER — Other Ambulatory Visit: Payer: Self-pay

## 2018-09-27 ENCOUNTER — Ambulatory Visit (INDEPENDENT_AMBULATORY_CARE_PROVIDER_SITE_OTHER): Payer: Self-pay | Admitting: Internal Medicine

## 2018-09-27 VITALS — BP 156/89 | HR 82 | Temp 98.2°F | Ht 61.0 in | Wt 136.8 lb

## 2018-09-27 DIAGNOSIS — J328 Other chronic sinusitis: Secondary | ICD-10-CM

## 2018-09-27 DIAGNOSIS — R1013 Epigastric pain: Secondary | ICD-10-CM

## 2018-09-27 DIAGNOSIS — D509 Iron deficiency anemia, unspecified: Secondary | ICD-10-CM

## 2018-09-27 DIAGNOSIS — K648 Other hemorrhoids: Secondary | ICD-10-CM

## 2018-09-27 DIAGNOSIS — Z79899 Other long term (current) drug therapy: Secondary | ICD-10-CM

## 2018-09-27 DIAGNOSIS — K921 Melena: Secondary | ICD-10-CM

## 2018-09-27 DIAGNOSIS — D126 Benign neoplasm of colon, unspecified: Secondary | ICD-10-CM

## 2018-09-27 DIAGNOSIS — K2970 Gastritis, unspecified, without bleeding: Secondary | ICD-10-CM

## 2018-09-27 DIAGNOSIS — J321 Chronic frontal sinusitis: Secondary | ICD-10-CM

## 2018-09-27 DIAGNOSIS — D560 Alpha thalassemia: Secondary | ICD-10-CM

## 2018-09-27 LAB — CBC
HEMATOCRIT: 41.3 % (ref 39.0–52.0)
Hemoglobin: 13 g/dL (ref 13.0–17.0)
MCH: 21.8 pg — ABNORMAL LOW (ref 26.0–34.0)
MCHC: 31.5 g/dL (ref 30.0–36.0)
MCV: 69.2 fL — ABNORMAL LOW (ref 80.0–100.0)
PLATELETS: 350 10*3/uL (ref 150–400)
RBC: 5.97 MIL/uL — AB (ref 4.22–5.81)
RDW: 14.2 % (ref 11.5–15.5)
WBC: 9.3 10*3/uL (ref 4.0–10.5)
nRBC: 0 % (ref 0.0–0.2)

## 2018-09-27 MED ORDER — AMOXICILLIN-POT CLAVULANATE 875-125 MG PO TABS: 1.0000 | ORAL_TABLET | Freq: Two times a day (BID) | ORAL | 0 refills | Status: AC

## 2018-09-27 NOTE — Assessment & Plan Note (Signed)
Patient is complaining of headache, dizziness, and sinus pain/congestion.  This has been a chronic issue for him.  He was previously referred to ENT.  Unfortunately we do not have these records to review.  Patient reports ENT did not start any new therapy or plan for any further work-up or treatment.  He is endorsing symptoms of subjective fevers and chills, but does not have a thermometer to check his temperature at home. Maxillofacial CT with contrast on 07/06/2018 demonstrated chronic sinusitis with worsening opacification of his frontal sinuses progressed since 2018 and partial left mastoid and middle ear opacification that is new.  He has failed conservative treatment with Claritin and Flonase.  Given his chronic persistent symptoms I feel it is reasonable to treat for bacterial sinusitis. --Augmentin twice daily x5 days

## 2018-09-27 NOTE — Patient Instructions (Addendum)
Mr. Cravens,  It was a pleasure to see you. I am sorry to hear you aren't feeling well. Your blood counts today are normal. For the blood in your stool, it is important to follow up with your GI doctor for further evaluation. You may need further studies to find the source of your bleed.   For your sinus infection, I have given you an antibiotic to take twice daily for the next 5 days. Please make an appointment to follow up with [place] again in 2 weeks.  You may cancel if you are feeling better.   If you have any questions or concerns, call our clinic at [phone] or after hours call [phone] and ask for the internal medicine resident on call. Thank you!  Dr. Philipp Ovens

## 2018-09-27 NOTE — Assessment & Plan Note (Addendum)
Patient is here with complaint of epigastric abdominal pain and melena.  He reports 3 episodes of melena over the past few days.  He denies hematochezia, nausea, and vomiting.  No chest pain or shortness of breath.  He has a history of chronic iron deficiency anemia and alpha thalassemia.  Patient follows with gastroenterology.  Underwent EGD and colonoscopy on 02/11/2018 for work-up of his iron deficiency anemia.  EGD showed erosive gastritis.  Biopsies were negative for H. pylori. Colonoscopy showed one sessile serrated polyp, few diverticuli, and internal hemorrhoids otherwise unremarkable.  He was started on Protonix once daily and iron supplements.  Plan was to pursue capsule endoscopy if no improvement in his iron deficiency anemia.  CBC was checked today due to complaint of melena.  Hemoglobin is stable/improved, 13.0 from 12.2 three weeks ago.  Although hemoglobin is stable, given his complaint of recurrent melena I have instructed patient to follow-up with GI to consider further work-up.  Instructed patient to continue daily Protonix. --Follow-up GI for possible capsule endoscopy -Continue Protonix 40 mg daily

## 2018-09-27 NOTE — Progress Notes (Signed)
   CC: Headache, dizziness, and stomach pain  HPI:  [name] is a 57 y.o. male with past medical history outlined below here for headache, dizziness, and stomach pain. For the details of today's visit, please refer to the assessment and plan.  Past Medical History:  Diagnosis Date  . Anemia   . CHF (congestive heart failure) ([place])   . GERD (gastroesophageal reflux disease)   . Hypertension     Review of Systems  Gastrointestinal: Positive for abdominal pain.  Neurological: Positive for dizziness and headaches. Negative for loss of consciousness.    Physical Exam:  Vitals:   09/27/18 1426  BP: (!) 156/89  Pulse: 82  Temp: 98.2 F (36.8 C)  TempSrc: Oral  SpO2: 99%  Weight: 136 lb 12.8 oz (62.1 kg)  Height: 5\' 1"  (1.549 m)    Constitutional: NAD, appears comfortable Cardiovascular: RRR, no murmurs, rubs, or gallops.  Pulmonary/Chest: CTAB, no wheezes, rales, or rhonchi.  Abdominal: Soft, non distended. No rebound. Tenderness to palpation in the epigastric region.  Extremities: Warm and well perfused. No edema.  Psychiatric: Normal mood and affect  Assessment & Plan:   See Encounters Tab for problem based charting.  Patient discussed with Dr. Rebeca Alert

## 2018-10-05 NOTE — Progress Notes (Signed)
Internal Medicine Clinic Attending  Case discussed with Dr. Guilloud at the time of the visit.  We reviewed the resident's history and exam and pertinent patient test results.  I agree with the assessment, diagnosis, and plan of care documented in the resident's note.  Alexander Raines, M.D., Ph.D.  

## 2018-10-05 NOTE — Congregational Nurse Program (Signed)
CN office visit with interpreter Diu Hartshorn assisting.  States BP was high at last MD visit.  Today it is 132/96.  Reports taking medication as directed.  Has a 2 week follow-up appointment for BP check on 10/11/2018 at [facility].  Jake Michaelis RN, Congregational Nurse [phone].

## 2018-10-10 ENCOUNTER — Encounter: Payer: Self-pay | Admitting: Internal Medicine

## 2018-10-11 ENCOUNTER — Ambulatory Visit (INDEPENDENT_AMBULATORY_CARE_PROVIDER_SITE_OTHER): Payer: Self-pay | Admitting: Internal Medicine

## 2018-10-11 DIAGNOSIS — Z79899 Other long term (current) drug therapy: Secondary | ICD-10-CM

## 2018-10-11 DIAGNOSIS — K921 Melena: Secondary | ICD-10-CM

## 2018-10-11 DIAGNOSIS — K5900 Constipation, unspecified: Secondary | ICD-10-CM

## 2018-10-11 DIAGNOSIS — J329 Chronic sinusitis, unspecified: Secondary | ICD-10-CM

## 2018-10-11 MED ORDER — PANTOPRAZOLE SODIUM 40 MG PO TBEC: 40.0000 mg | DELAYED_RELEASE_TABLET | Freq: Every day | ORAL | 2 refills | Status: DC

## 2018-10-11 MED ORDER — LISINOPRIL 40 MG PO TABS: 40.0000 mg | ORAL_TABLET | Freq: Every day | ORAL | 3 refills | Status: DC

## 2018-10-11 MED ORDER — FLUTICASONE PROPIONATE 50 MCG/ACT NA SUSP: 2.0000 | Freq: Every day | NASAL | 0 refills | Status: DC

## 2018-10-11 NOTE — Progress Notes (Signed)
   CC: sinus congestion  HPI:[name] is a 57 y.o. male who presents for evaluation of sinus congestion. Please see individual problem based A/P for details.  Sinusitis: The patients symptoms peristed. Patient stated that he took the Augmentin as prescribed but that this did not benefit him. He continues to experience persistent congestion and thick purulent sinus drainage. He denied fevers, chills, nausea, vomiting, ear pain, eye pain or drainage, throat pain or swelling. He stated that he recently went to see ENT who he believed instructed him to take Flonase but that there was nothing which could be done for him, although he stated that his daughter was the interpreter at that time and there may have been something lost in the translation. Upon chart review, Dr. Lynnae January was able to identify that the patient had seen Dr. Lucia Gaskins in late August who recommended that the patient return if his symptoms worsened or failed to improve as they would again consider surgery.    Plan: He has been advised to see ENT, Dr. Lucia Gaskins. He will call them and schedule Refilled Flonase for current symptoms   Melena: Improved/resolved. Continues to have constipation.  Plan:  Monitor. Most recent CBC stable.   Past Medical History:  Diagnosis Date  . Anemia   . CHF (congestive heart failure) ([place])   . GERD (gastroesophageal reflux disease)   . Hypertension    Review of Systems:  ROS negative except as per HPI.  Physical Exam: Vitals:   10/11/18 1444  BP: (!) 153/98  Pulse: 81  Temp: 98.9 F (37.2 C)  TempSrc: Oral  SpO2: 99%  Weight: 135 lb 1.6 oz (61.3 kg)   General: A/O x4, in no acute distress, afebrile, nondiaphoretic HEENT: Minimal mucus noted on visualization of the nasal passages, mildly erythematous turbinates  Assessment & Plan:   See Encounters Tab for problem based charting.  Patient discussed with Dr. Lynnae January

## 2018-10-11 NOTE — Patient Instructions (Signed)
FOLLOW-UP INSTRUCTIONS When: 2 months for routine with Dr. Reesa Chew What to bring: All of your medications  I have made not made any changes to your medications today.   Today we discussed your chronic sinusitis.   Please call: Dr. Rondel Oh [facility] [place], [place], [place] [address] [phone] They agreed that you would need to be seen again if your treatment they recommended did not work.   Thank you for your visit to the [facility] today. If you have any questions or concerns please call us at [phone].

## 2018-10-12 MED ORDER — LISINOPRIL 40 MG PO TABS: 40.0000 mg | ORAL_TABLET | Freq: Every day | ORAL | 3 refills | Status: DC

## 2018-10-12 MED ORDER — PANTOPRAZOLE SODIUM 40 MG PO TBEC: 40.0000 mg | DELAYED_RELEASE_TABLET | Freq: Every day | ORAL | 0 refills | Status: DC

## 2018-10-12 NOTE — Assessment & Plan Note (Signed)
Melena: Improved/resolved. Continues to have constipation.  Plan:  Monitor. Most recent CBC stable.

## 2018-10-12 NOTE — Progress Notes (Signed)
Internal Medicine Clinic Attending  Case discussed with Dr. Harbrecht at the time of the visit.  We reviewed the resident's history and exam and pertinent patient test results.  I agree with the assessment, diagnosis, and plan of care documented in the resident's note.   

## 2018-10-12 NOTE — Assessment & Plan Note (Signed)
Sinusitis: The patients symptoms peristed. Patient stated that he took the Augmentin as prescribed but that this did not benefit him. He continues to experience persistent congestion and thick purulent sinus drainage. He denied fevers, chills, nausea, vomiting, ear pain, eye pain or drainage, throat pain or swelling. He stated that he recently went to see ENT who he believed instructed him to take Flonase but that there was nothing which could be done for him, although he stated that his daughter was the interpreter at that time and there may have been something lost in the translation. Upon chart review, Dr. Lynnae January was able to identify that the patient had seen Dr. Lucia Gaskins in late August who recommended that the patient return if his symptoms worsened or failed to improve as they would again consider surgery.    Plan: He has been advised to see ENT, Dr. Lucia Gaskins. He will call them and schedule Refilled Flonase for current symptoms

## 2018-10-12 NOTE — Congregational Nurse Program (Signed)
CN office visit with interpreter Diu Hartshorn assisting.  BP 132/92.  Scheduled appointment for [place] sign-up for 10/18/2018 at [place]. [facility]. Also scheduled appointment with [place] clinic for 10/19/2018 at 1:30 PM regarding naturalization.  Jake Michaelis RN, Congregational Nurse [phone]

## 2018-10-12 NOTE — Addendum Note (Signed)
Addended by: Nicola Girt on: 10/12/2018 01:42 PM   Modules accepted: Orders

## 2018-10-19 NOTE — Congregational Nurse Program (Signed)
CN office visit with interpreter Diu Hartshorn.  BP 130/88.  States he is still having problems with nasal  congestion.   He still has written prescription from Dr. Lucia Gaskins for [place].  He is using a saline nasal spray which he thought was the same as prescription.  Explained that prescription contains steroids and that he needs to start using it.  He will get it filled today.He missed appointment with [facility] navigator because he did not have an interpreter.  CN will reschedule. Jake Michaelis RN, Congregational Nurse [phone]

## 2018-10-25 ENCOUNTER — Ambulatory Visit: Payer: Self-pay

## 2018-10-31 ENCOUNTER — Telehealth: Payer: Self-pay | Admitting: Gastroenterology

## 2018-10-31 ENCOUNTER — Other Ambulatory Visit: Payer: Self-pay

## 2018-11-02 ENCOUNTER — Ambulatory Visit (INDEPENDENT_AMBULATORY_CARE_PROVIDER_SITE_OTHER): Payer: Self-pay | Admitting: Gastroenterology

## 2018-11-02 ENCOUNTER — Telehealth: Payer: Self-pay | Admitting: Gastroenterology

## 2018-11-02 ENCOUNTER — Encounter: Payer: Self-pay | Admitting: Gastroenterology

## 2018-11-02 VITALS — BP 136/84 | HR 72 | Ht 61.0 in | Wt 136.8 lb

## 2018-11-02 DIAGNOSIS — D560 Alpha thalassemia: Secondary | ICD-10-CM

## 2018-11-02 DIAGNOSIS — K5900 Constipation, unspecified: Secondary | ICD-10-CM

## 2018-11-02 DIAGNOSIS — K219 Gastro-esophageal reflux disease without esophagitis: Secondary | ICD-10-CM

## 2018-11-02 MED ORDER — SUCRALFATE 1 G PO TABS: 1.0000 g | ORAL_TABLET | Freq: Four times a day (QID) | ORAL | 1 refills | Status: DC | PRN

## 2018-11-02 MED ORDER — SUCRALFATE 1 GM/10ML PO SUSP: 1.0000 g | Freq: Four times a day (QID) | ORAL | 1 refills | Status: DC | PRN

## 2018-11-02 NOTE — Addendum Note (Signed)
Addended by: Marcelino Duster on: 11/02/2018 04:19 PM   Modules accepted: Orders

## 2018-11-02 NOTE — Progress Notes (Signed)
HPI :  57 year old male here for follow-up visit. He's been previously seen for chronic microcytic anemia. Previously underwent EGD and colonoscopy without clear etiology. We repeated his labs since last visit and his iron studies were normal but he had a significant microcytic anemia that persisted. MCVs has remained 60s. He was found to have an abnormal hemoglobin electrophoresis which led to testing for alpha thalassemia for which he had 2 mutations. He is seeing his primary care doctor next week for further evaluation of this although I counseled him about this today.  Translator present to help translate history today. He reports having some constipation recently. By this he refers to passing hard stools. He is having roughly 3 bowel movements a day although does not feel like he can empty himself completely and passing hard balls of stool. He has not had any significant blood in the stools or new changes. He feels some abdominal bloating with this. His prior colonoscopy in March did not show any significant findings. He is not currently taking anything for his bowels.  He otherwise endorses having some ongoing reflux symptoms with pyrosis and sensation in his chest at times. Protonix at 40 mg once daily has provided some benefit although has not resolved the symptoms. His prior endoscopy in March did not show any negative esophageal pathology. He denies any dysphagia  Otherwise he had a CT scan in February of this year showing normal small bowel but nonspecific mesenteric lymphadenopathy - radiology recommend interval repeat CT scan of the abdomen / pelvis to reassess. Since his last visit he had a CT scan done in September which was normal without any adenopathy appreciated, that had resolved.  EGD 02/11/2018 - small esophageal diverticulum, gastritis / gastric polyp, biopsies negative for H p Colonoscopy 02/11/2018 - normal ileum, 14mm polyp ascending colon - sessile serrated, diverticulosis,  hemorrhoids - recall 2024   Past Medical History:  Diagnosis Date  . Anemia   . CHF (congestive heart failure) ([place])   . GERD (gastroesophageal reflux disease)   . Hypertension   . Thalassemia, alpha ([place])      Past Surgical History:  Procedure Laterality Date  . NO PAST SURGERIES     Family History  Problem Relation Age of Onset  . Colon cancer Neg Hx   . Stomach cancer Neg Hx    Social History   Tobacco Use  . Smoking status: Former Research scientist (life sciences)  . Smokeless tobacco: Never Used  Substance Use Topics  . Alcohol use: No    Frequency: Never    Comment: former  . Drug use: No   Current Outpatient Medications  Medication Sig Dispense Refill  . fluticasone (FLONASE) 50 MCG/ACT nasal spray Place 2 sprays into both nostrils daily. 16 g 0  . lisinopril (PRINIVIL,ZESTRIL) 40 MG tablet Take 1 tablet (40 mg total) by mouth daily. 90 tablet 3  . loratadine (CLARITIN) 10 MG tablet Take 1 tablet (10 mg total) by mouth daily. 90 tablet 3  . pantoprazole (PROTONIX) 40 MG tablet Take 1 tablet (40 mg total) by mouth daily. 90 tablet 0  . sucralfate (CARAFATE) 1 GM/10ML suspension Take 10 mLs (1 g total) by mouth every 6 (six) hours as needed. 420 mL 1   No current facility-administered medications for this visit.    No Known Allergies   Review of Systems: All systems reviewed and negative except where noted in HPI.   Lab Results  Component Value Date   WBC 9.3 09/27/2018   HGB  13.0 09/27/2018   HCT 41.3 09/27/2018   MCV 69.2 (L) 09/27/2018   PLT 350 09/27/2018    Lab Results  Component Value Date   CREATININE 1.13 02/16/2018   BUN 15 02/16/2018   NA 137 02/16/2018   K 4.3 02/16/2018   CL 101 02/16/2018   CO2 20 02/16/2018    Lab Results  Component Value Date   ALT 15 (L) 02/09/2018   AST 31 02/09/2018   ALKPHOS 70 02/09/2018   BILITOT 1.0 02/09/2018     Physical Exam: BP 136/84   Pulse 72   Ht 5\' 1"  (1.549 m)   Wt 136 lb 12.8 oz (62.1 kg)   BMI 25.85 kg/m    Constitutional: Pleasant,well-developed, male in no acute distress. HEENT: Normocephalic and atraumatic. Conjunctivae are normal. No scleral icterus. Neck supple.  Cardiovascular: Normal rate, regular rhythm.  Pulmonary/chest: Effort normal and breath sounds normal. No wheezing, rales or rhonchi. Abdominal: Soft, nondistended, nontender. There are no masses palpable. No hepatomegaly. Extremities: no edema Lymphadenopathy: No cervical adenopathy noted. Neurological: Alert and oriented to person place and time. Skin: Skin is warm and dry. No rashes noted. Psychiatric: Normal mood and affect. Behavior is normal.   ASSESSMENT AND PLAN: 57 year old male here for reassessment of the following issues:  Chronic anemia / alpha thallasemia - had a component of iron deficiency however I think microcytosis chronic mild anemia likely related to alpha thalassemia. I counseled him on what this is and the benign nature of this. He has follow-up with his primary care who is aware of this. Hemoglobin appears normal limits last checked while off iron. He has no bleeding symptoms at this time. Would continue to monitor for now  Constipation - discussed options, recommend a trial of Citrucel once daily to see if this can help regulate bowels better. If this does not help he can use some MiraLAX as needed.  GERD - difficult pathology on prior EGD, however while symptoms are improved on once daily Protonix have not resolved. I asked him to try twice daily Protonix for 2 weeks and see if that helps. I also provided him some liquid Carafate to use as needed to see if that provides any relief for breakthrough. He can follow-up as needed for that issue.  The patient had several other questions regarding shoulder pain and sinus congestion today etc. He is seeing his primary care physician next week to discuss these issues. Suspect he may have a musculoskeletal strain in his left shoulder, he can try some NSAIDs in the  interim.   Presho Cellar, MD [facility]

## 2018-11-02 NOTE — Telephone Encounter (Signed)
Returned call to [phone] and spoke to PCP Triage nurse who speaks [place], Her name is "Hme", pronounced "05-23-23". Since the suspension Carafate is too expensive we will switch to tablets. He will need instructions on making a slurry.  She indicated she will instruct him.  Rx for Carafate tabs has been sent. We also discussed that pt is confused about "GI" vs. PCP responsibilities. They have discussed this several times but she will reinforce.   [facility] Dept does not carry any form of Carafate.  "May 23, 2023" at Dr.Amin's office asked me to send to [place].  GoodRx says its about $30 at [facility] or [facility]. He may not be able to afford that. She will call and discuss with pt.

## 2018-11-02 NOTE — Patient Instructions (Addendum)
If you are age 57 or older, your body mass index should be between 23-30. Your Body mass index is 25.85 kg/m. If this is out of the aforementioned range listed, please consider follow up with your Primary Care Provider.  If you are age 35 or younger, your body mass index should be between 19-25. Your Body mass index is 25.85 kg/m. If this is out of the aformentioned range listed, please consider follow up with your Primary Care Provider.    Purchase Citrucel over the counter and take daily.  Increase Protonix to twice a day for 2 weeks and see if this helps your symptoms.  We have sent the following medications to your pharmacy for you to pick up at your convenience: Carafate: Take 54ml every 6 hours as needed    You have alpha thalassemia and you should discuss with your primary doctor at your next visit.  Thank you for entrusting me with your care and for choosing [facility], Dr. [place] Cellar

## 2018-11-02 NOTE — Telephone Encounter (Signed)
Call made to [facility]-current carafate rx will cost $19.90, but will cost pt $6 under the "IM Program".  Will have pcp send in new rx under the IM program.Eric Kerins Cassady11/20/20194:15 PM

## 2018-11-03 ENCOUNTER — Other Ambulatory Visit: Payer: Self-pay | Admitting: Internal Medicine

## 2018-11-03 MED ORDER — SUCRALFATE 1 G PO TABS: 1.0000 g | ORAL_TABLET | Freq: Four times a day (QID) | ORAL | 2 refills | Status: DC | PRN

## 2018-11-03 NOTE — Telephone Encounter (Signed)
I did send a new prescription with IM Program to [facility].

## 2018-11-07 ENCOUNTER — Other Ambulatory Visit: Payer: Self-pay

## 2018-11-07 ENCOUNTER — Ambulatory Visit: Payer: Self-pay | Admitting: Internal Medicine

## 2018-11-07 ENCOUNTER — Encounter: Payer: Self-pay | Admitting: Internal Medicine

## 2018-11-07 VITALS — BP 134/88 | HR 97 | Temp 98.2°F | Wt 136.3 lb

## 2018-11-07 DIAGNOSIS — J321 Chronic frontal sinusitis: Secondary | ICD-10-CM

## 2018-11-07 DIAGNOSIS — J329 Chronic sinusitis, unspecified: Secondary | ICD-10-CM

## 2018-11-07 DIAGNOSIS — M25512 Pain in left shoulder: Secondary | ICD-10-CM

## 2018-11-07 DIAGNOSIS — Z79899 Other long term (current) drug therapy: Secondary | ICD-10-CM

## 2018-11-07 DIAGNOSIS — I5033 Acute on chronic diastolic (congestive) heart failure: Secondary | ICD-10-CM

## 2018-11-07 DIAGNOSIS — D500 Iron deficiency anemia secondary to blood loss (chronic): Secondary | ICD-10-CM

## 2018-11-07 DIAGNOSIS — K5909 Other constipation: Secondary | ICD-10-CM

## 2018-11-07 DIAGNOSIS — I110 Hypertensive heart disease with heart failure: Secondary | ICD-10-CM

## 2018-11-07 DIAGNOSIS — I10 Essential (primary) hypertension: Secondary | ICD-10-CM

## 2018-11-07 DIAGNOSIS — R140 Abdominal distension (gaseous): Secondary | ICD-10-CM | POA: Insufficient documentation

## 2018-11-07 MED ORDER — PANTOPRAZOLE SODIUM 40 MG PO TBEC: 40.0000 mg | DELAYED_RELEASE_TABLET | Freq: Two times a day (BID) | ORAL | 2 refills | Status: DC

## 2018-11-07 MED ORDER — POLYETHYLENE GLYCOL 3350 17 G PO PACK: 17.0000 g | PACK | Freq: Every day | ORAL | 2 refills | Status: DC

## 2018-11-07 MED ORDER — SALINE SPRAY 0.65 % NA SOLN: 1.0000 | NASAL | 3 refills | Status: DC | PRN

## 2018-11-07 MED ORDER — NAPROXEN 500 MG PO TABS: 500.0000 mg | ORAL_TABLET | Freq: Two times a day (BID) | ORAL | 2 refills | Status: DC | PRN

## 2018-11-07 NOTE — Assessment & Plan Note (Signed)
He was complaining of abdominal bloating.  Patient has an history of chronic constipation, his last bowel movement was 2 days ago. He was seen by his gastroenterologist last week with similar complaints and abdominal pain.  He was advised to increase his dose of Protonix 40 mg twice daily, he was also given a prescription of Carafate and was also advised to take MiraLAX. Patient has not started doing any of that.  Advised him to increased his dose of Protonix from daily to twice daily, a new prescription was sent to [facility] outpatient pharmacy with IM program. He was given a prescription of Carafate few days ago with IM program at [place] which he has not picked up yet, advised to pick that prescription and start taking it as directed. He was also advised to use MiraLAX daily for at least 2 weeks and then as needed.  Patient has a very little health literacy and language barrier, I advised him to bring a family member with him for his medication management. He does not has any insurance and we cannot order any home health services at this time.

## 2018-11-07 NOTE — Assessment & Plan Note (Signed)
Patient denies any worsening shortness of breath, orthopnea PND.

## 2018-11-07 NOTE — Progress Notes (Signed)
   CC: Left shoulder pain and abdominal bloated ness.  HPI:  Eric Ingram is a 57 y.o. with past medical history as listed below came to the clinic for follow-up of his hypertension. He was also complaining of left shoulder pain for the past 2 weeks, pain radiates to his left upper arm, increased with any activity around left shoulder and decreased with resting.  According to patient whenever he coughs his pain get worse and after spitting out some sputum his pain relieved.  He denies any tingling, numbness or focal weakness.  He occasionally uses Advil or Tylenol with minimum relief.  He was also complaining of increased in his abdominal bloatedness, he continue to have constipation.  Last bowel movement was 2 days ago.  He denies any melena or hematochezia.  He also continued to experience nasal congestion along with mild intermittent nasal bleed.  He was asking for more antibiotics.  Please see assessment and plan for his chronic conditions.  Patient speaks [place] and history was obtained with the help of an interpreter.  He is trying to get a legal status in this country and did bring papers for a physical exam for immigration purposes-told him that we do not filled out those forms here and he should contact a designated physician for immigration physicals.   Past Medical History:  Diagnosis Date  . Anemia   . CHF (congestive heart failure) ([place])   . GERD (gastroesophageal reflux disease)   . Hypertension   . Thalassemia, alpha ([place])    Review of Systems: Negative except mentioned in HPI.  Physical Exam:  Vitals:   11/07/18 1318  BP: (!) 155/90  Pulse: 99  Temp: 98.2 F (36.8 C)  TempSrc: Oral  SpO2: 99%  Weight: 136 lb 4.8 oz (61.8 kg)    General: Vital signs reviewed.  Patient is well-developed and well-nourished, in no acute distress and cooperative with exam.  Head: Normocephalic and atraumatic. Eyes: EOMI, conjunctivae normal, no scleral icterus.  Neck:  Supple, trachea midline, normal ROM, no JVD, masses, thyromegaly, or carotid bruit present.  Cardiovascular: RRR, S1 normal, S2 normal, no murmurs, gallops, or rubs. Pulmonary/Chest: Clear to auscultation bilaterally, no wheezes, rales, or rhonchi. Abdominal: Soft, non-tender, non-distended, BS +, no masses, organomegaly, or guarding present.  Musculoskeletal: Mild tenderness along the left trapezius, mildly restricted range of motion along left shoulder due to pain.  Strength and sensations intact and symmetrical. Extremities: No lower extremity edema bilaterally,  pulses symmetric and intact bilaterally. No cyanosis or clubbing. Skin: Warm, dry and intact. No rashes or erythema. Psychiatric: Normal mood and affect.   Assessment & Plan:   See Encounters Tab for problem based charting.  Patient discussed with Dr. Angelia Mould.

## 2018-11-07 NOTE — Assessment & Plan Note (Signed)
BP Readings from Last 3 Encounters:  11/07/18 134/88  11/02/18 136/84  10/19/18 130/88   On initial presentation his blood pressure was elevated at 155/90 which improved on recheck. According to patient he is compliant with his lisinopril. Denies any dizziness.  Continue with current dose of lisinopril at 40 mg daily.

## 2018-11-07 NOTE — Assessment & Plan Note (Signed)
Denies any ongoing bleeding. We checked him for hemoglobinopathies and found to have 2 mutations for alpha thalassemia. His hemoglobin is improving and he continued to have microcytosis.  We will continue to monitor.

## 2018-11-07 NOTE — Assessment & Plan Note (Signed)
Patient continued to experience nasal congestion and discharge.  He was asking for more antibiotics. He was seen in clinic last month with similar symptoms and was asked to revisit his ENT. He was seen by ENT Dr. Lucia Gaskins in August 2019 and was given another course of Augmentin.  They advised to come back if his symptoms fail to resolved and he might need surgery at that point. He did not make a follow-up appointment. He has given courses of antibiotics multiple times in the past. His CT sinus shows chronic sinusitis and opacities.  Advised him to make an appointment with his ENT for a definitive management of his sinusitis. He will continue Flonase. He was given a prescription of saline nasal spray for mild intermittent nasal bleeding most likely due to dryness.

## 2018-11-07 NOTE — Patient Instructions (Addendum)
Thank you for visiting clinic today. For your stomach pain and bloated ness. Please start taking Protonix twice daily along with Carafate as advised by your gastroenterologist. As to patients can cause increased bloated ness, please use MiraLAX daily for 2 weeks and then as needed.  Drink plenty of water and eat more fiber to help with your constipation.  For your sinus congestion and bleeding through nose. Continue using Flonase as directed. I am giving you a saline nasal spray to help with dryness and bleeding. Most important thing is that you make an appointment to see your ENT specialist Dr. Lucia Gaskins as soon as possible.  For left shoulder pain. It looks like you are having some muscular pain, please take naproxen 500 mg twice daily for 3 days and then only as needed. You can also use heating pad and over-the-counter rubbing stuff which include ice and hot, or BenGay to help ease of that pain.  For blood pressure. Your blood pressure in the beginning was little high which improved on recheck. Please continue taking your lisinopril daily.

## 2018-11-09 NOTE — Progress Notes (Signed)
Internal Medicine Clinic Attending  Case discussed with Dr. Amin at the time of the visit.  We reviewed the resident's history and exam and pertinent patient test results.  I agree with the assessment, diagnosis, and plan of care documented in the resident's note.    

## 2018-11-16 NOTE — Congregational Nurse Program (Signed)
CN office visit with interpreter Diu Hartshorn assisting.  Patient states he cannot eat solid food because he had all his upper teeth pulled.  His orange card had expired and he needed to renew it before continuing dental treatment.  Message left at the [facility] regarding scheduling appointment for next phase of treatment.  States he is taking all medications as ordered and that he can breathe better since starting Flonase.  Jake Michaelis RN, Congregational Nurse [phone]

## 2018-12-28 NOTE — Congregational Nurse Program (Signed)
CN office visit with interpreter Diu Hartshorn assisting.  Patient came to get clarification on how to obtain dentures.  We reviewed the information he received from the [facility] clinic and advised him of his next appointment with them for another extraction (January 31,2020 at 8:00 am).  States as soon as he gets the money he plans to get his upper dentures.  Stated he stopped his Flonase because of the cost.  Phone call to [facility] out-patient pharmacy to determine their cost.  He will pick it up there because it is about half of what he paid at [place], Congregational Nurse [phone].

## 2019-02-08 ENCOUNTER — Ambulatory Visit (HOSPITAL_COMMUNITY)
Admission: RE | Admit: 2019-02-08 | Discharge: 2019-02-08 | Disposition: A | Discharge status: Home / Self Care | Payer: Self-pay | Source: Ambulatory Visit | Attending: Internal Medicine | Admitting: Internal Medicine

## 2019-02-08 ENCOUNTER — Other Ambulatory Visit: Payer: Self-pay

## 2019-02-08 ENCOUNTER — Encounter: Payer: Self-pay | Admitting: Internal Medicine

## 2019-02-08 ENCOUNTER — Ambulatory Visit (INDEPENDENT_AMBULATORY_CARE_PROVIDER_SITE_OTHER): Payer: Self-pay | Admitting: Internal Medicine

## 2019-02-08 DIAGNOSIS — A084 Viral intestinal infection, unspecified: Secondary | ICD-10-CM

## 2019-02-08 DIAGNOSIS — K5900 Constipation, unspecified: Secondary | ICD-10-CM

## 2019-02-08 DIAGNOSIS — R05 Cough: Secondary | ICD-10-CM

## 2019-02-08 DIAGNOSIS — R059 Cough, unspecified: Secondary | ICD-10-CM | POA: Insufficient documentation

## 2019-02-08 DIAGNOSIS — R000 Tachycardia, unspecified: Secondary | ICD-10-CM | POA: Insufficient documentation

## 2019-02-08 DIAGNOSIS — D72829 Elevated white blood cell count, unspecified: Secondary | ICD-10-CM

## 2019-02-08 DIAGNOSIS — K2970 Gastritis, unspecified, without bleeding: Secondary | ICD-10-CM

## 2019-02-08 DIAGNOSIS — M25512 Pain in left shoulder: Secondary | ICD-10-CM | POA: Insufficient documentation

## 2019-02-08 DIAGNOSIS — J209 Acute bronchitis, unspecified: Secondary | ICD-10-CM | POA: Insufficient documentation

## 2019-02-08 DIAGNOSIS — D649 Anemia, unspecified: Secondary | ICD-10-CM

## 2019-02-08 DIAGNOSIS — J329 Chronic sinusitis, unspecified: Secondary | ICD-10-CM

## 2019-02-08 HISTORY — DX: Gastritis, unspecified, without bleeding: K29.70

## 2019-02-08 LAB — CBC WITH DIFFERENTIAL/PLATELET
Abs Immature Granulocytes: 0.07 10*3/uL (ref 0.00–0.07)
BASOS ABS: 0.1 10*3/uL (ref 0.0–0.1)
BASOS PCT: 0 %
Eosinophils Absolute: 0.1 10*3/uL (ref 0.0–0.5)
Eosinophils Relative: 1 %
HEMATOCRIT: 34.5 % — AB (ref 39.0–52.0)
Hemoglobin: 11.1 g/dL — ABNORMAL LOW (ref 13.0–17.0)
Immature Granulocytes: 1 %
LYMPHS PCT: 5 %
Lymphs Abs: 0.7 10*3/uL (ref 0.7–4.0)
MCH: 21.7 pg — AB (ref 26.0–34.0)
MCHC: 32.2 g/dL (ref 30.0–36.0)
MCV: 67.5 fL — AB (ref 80.0–100.0)
Monocytes Absolute: 0.7 10*3/uL (ref 0.1–1.0)
Monocytes Relative: 5 %
NEUTROS ABS: 13 10*3/uL — AB (ref 1.7–7.7)
NRBC: 0 % (ref 0.0–0.2)
Neutrophils Relative %: 88 %
Platelets: 359 10*3/uL (ref 150–400)
RBC: 5.11 MIL/uL (ref 4.22–5.81)
RDW: 14.1 % (ref 11.5–15.5)
WBC: 14.6 10*3/uL — AB (ref 4.0–10.5)

## 2019-02-08 LAB — COMPREHENSIVE METABOLIC PANEL
ALT: 17 U/L (ref 0–44)
AST: 35 U/L (ref 15–41)
Albumin: 3.7 g/dL (ref 3.5–5.0)
Alkaline Phosphatase: 87 U/L (ref 38–126)
Anion gap: 11 (ref 5–15)
BILIRUBIN TOTAL: 0.7 mg/dL (ref 0.3–1.2)
BUN: 13 mg/dL (ref 6–20)
CHLORIDE: 102 mmol/L (ref 98–111)
CO2: 24 mmol/L (ref 22–32)
Calcium: 8.8 mg/dL — ABNORMAL LOW (ref 8.9–10.3)
Creatinine, Ser: 1.24 mg/dL (ref 0.61–1.24)
Glucose, Bld: 101 mg/dL — ABNORMAL HIGH (ref 70–99)
POTASSIUM: 3.6 mmol/L (ref 3.5–5.1)
Sodium: 137 mmol/L (ref 135–145)
TOTAL PROTEIN: 7.7 g/dL (ref 6.5–8.1)

## 2019-02-08 IMAGING — DX DG CHEST 2V
2 series · 2 of 2 positions shown · non-contrast
Comparison: Radiographs [DATE].

CLINICAL DATA: Productive cough.  Chest pain.

EXAM:
CHEST - 2 VIEW

[chest pa]
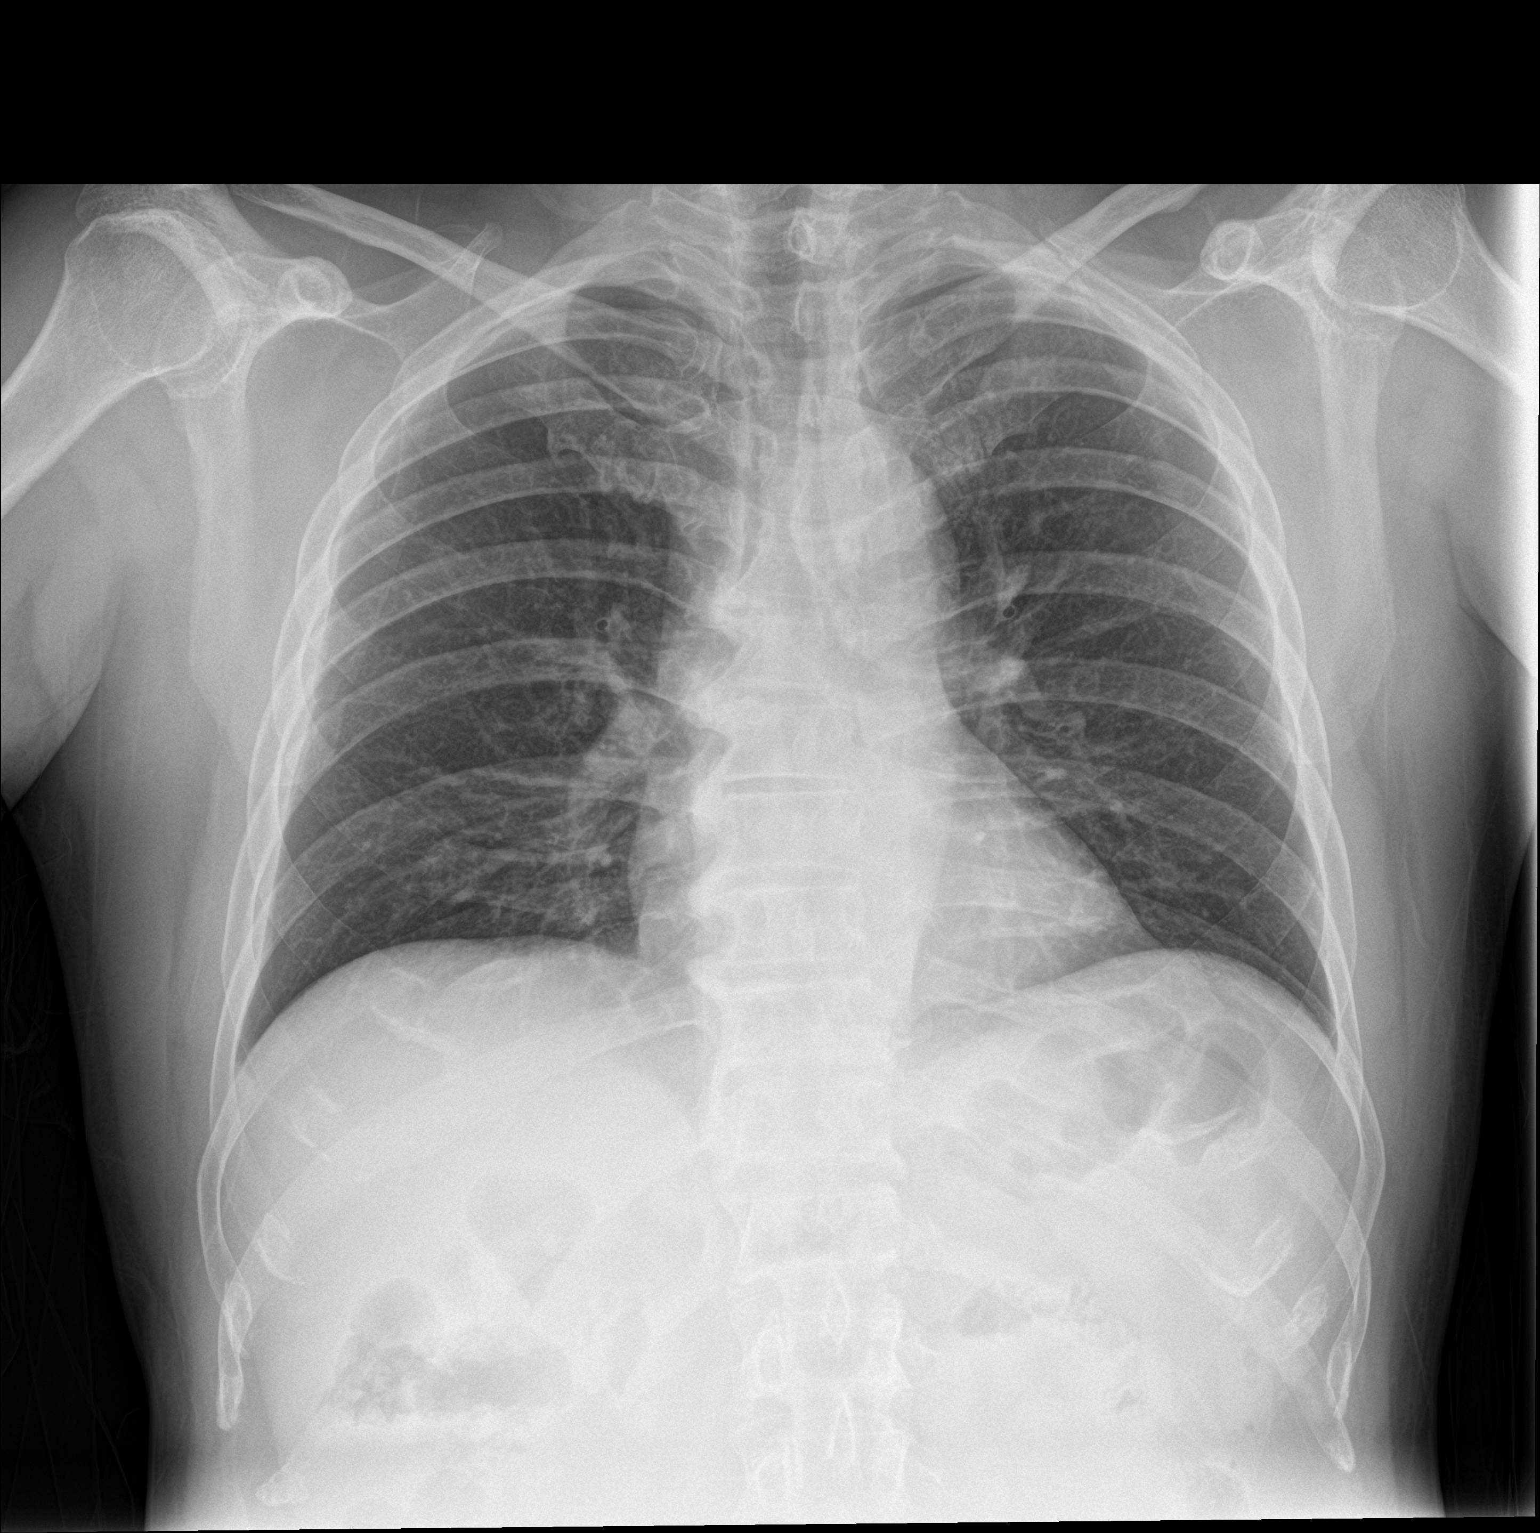

[chest lat]
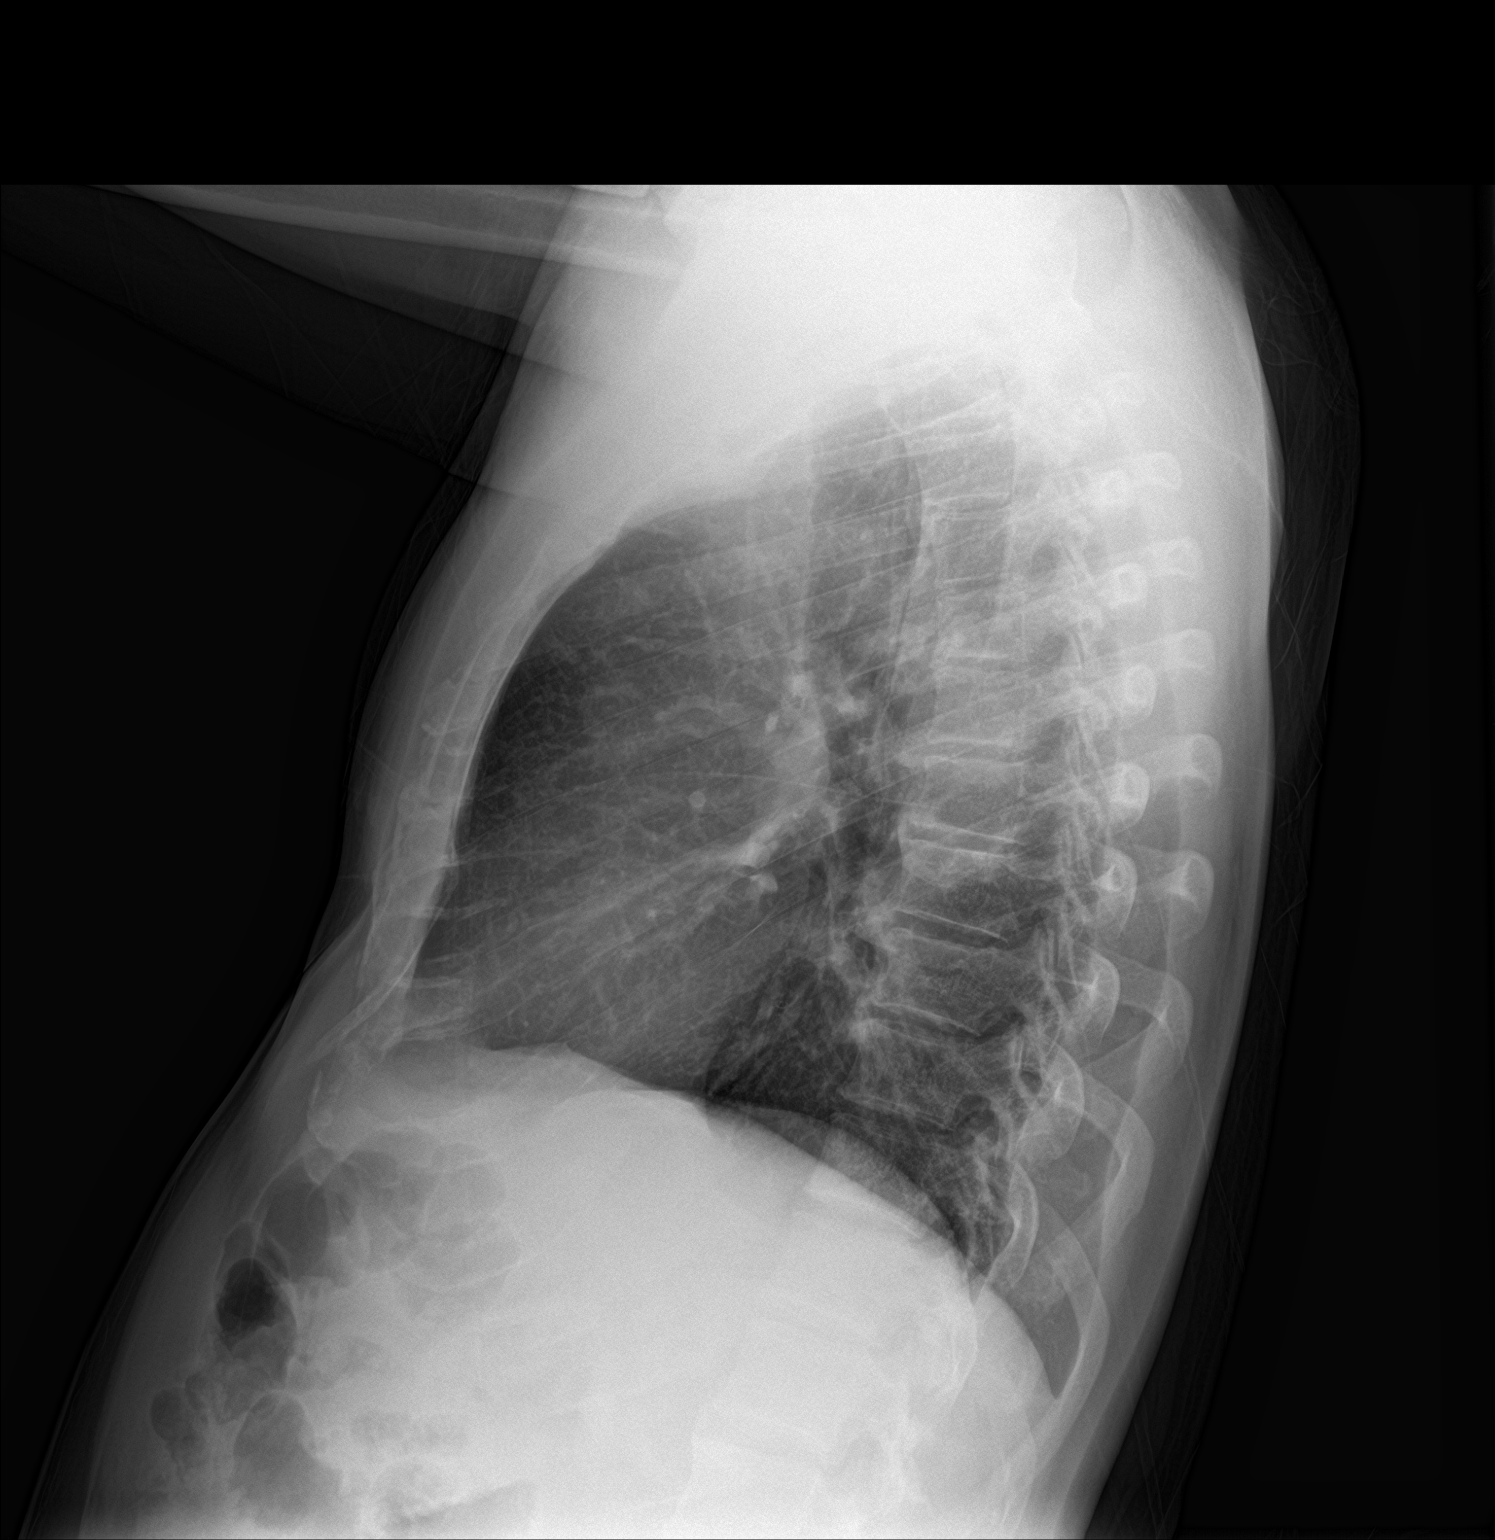

[2 of 2 positions shown; findings below may reference images not displayed]

FINDINGS: The heart size and mediastinal contours are within normal limits.
Both lungs are clear. No pneumothorax or pleural effusion is noted.
The visualized skeletal structures are unremarkable.
IMPRESSION: No active cardiopulmonary disease.

## 2019-02-08 IMAGING — DX DG ABDOMEN 1V
1 series · 1 of 1 positions shown · non-contrast
Comparison: CT scan of [DATE]. Radiograph [DATE].

CLINICAL DATA: Constipation.

EXAM:
ABDOMEN - 1 VIEW

[abdomen kub]
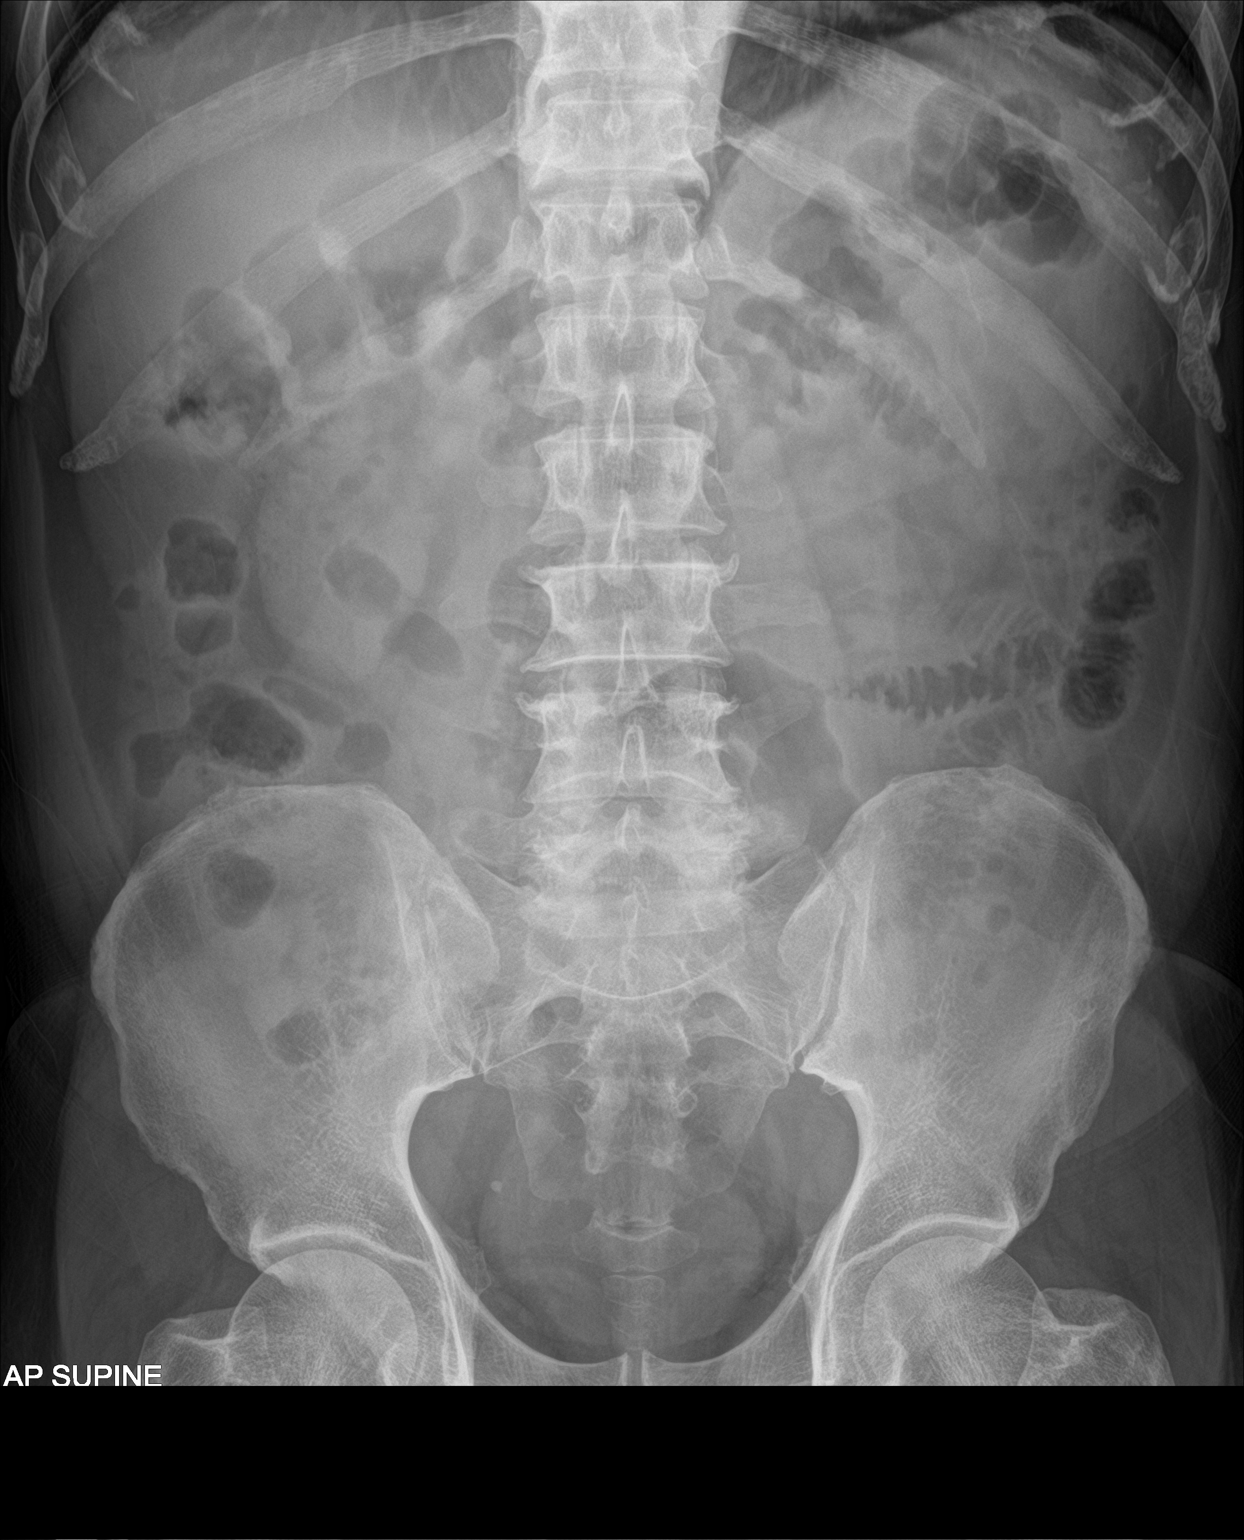

[1 of 1 positions shown; findings below may reference images not displayed]

FINDINGS: The bowel gas pattern is normal. No significant stool burden is
noted. No radio-opaque calculi or other significant radiographic
abnormality are seen.
IMPRESSION: No evidence of bowel obstruction or ileus. No significant stool
burden is noted.

## 2019-02-08 MED ORDER — ONDANSETRON 8 MG PO TBDP: 8.0000 mg | ORAL_TABLET | Freq: Three times a day (TID) | ORAL | 0 refills | Status: DC | PRN

## 2019-02-08 MED ORDER — SODIUM CHLORIDE 0.9 % IV BOLUS
1000.0000 mL | Freq: Once | INTRAVENOUS | Status: AC
  Administered 2019-02-08: 1000 mL via INTRAVENOUS

## 2019-02-08 MED ORDER — DOCUSATE SODIUM 50 MG PO CAPS: 50.0000 mg | ORAL_CAPSULE | Freq: Every day | ORAL | 1 refills | Status: DC

## 2019-02-08 NOTE — Assessment & Plan Note (Signed)
Patient is complaining of left shoulder pain.  Requested patient to make separate [facility] appointment to have this addressed.

## 2019-02-08 NOTE — Assessment & Plan Note (Signed)
Patient is here with complaint of productive cough.  He has a history of chronic sinusitis and follows with ENT.  He has been treated supportively with Flonase and antihistamines.  Today he feels that his cough is worsening.  On exam he has very faint crackles at his left base, but is otherwise clear throughout.  Temperature is 99.7 and he is tachycardic in the setting of a viral gastritis with likely dehydration.  Chest x-ray was obtained and negative for any active cardiopulmonary disease.  Reassured patient that imaging was normal.  Continue with Flonase and Claritin.  Follow-up with ENT.

## 2019-02-08 NOTE — Assessment & Plan Note (Signed)
Patient is here with multiple complaints.  Reports that he has been feeling sick for the past 10 days, but acutely worsened over the past few days.  He is complaining of abdominal distention and constipation.  2 days ago he developed nausea and vomiting and inability to tolerate p.o.  He continues to have bowel movements, but reports that his stools are very small and hard.  On exam he is tachycardic to the 120s with temperature 99.7.  EKG demonstrated sinus tachycardia.  On exam his abdomen is soft with hypoactive bowel sounds.  KUB was obtained which demonstrated diffuse stool, no evidence of air-fluid levels or free air under the diaphragm.  Labs were notable for leukocytosis and mild worsening of chronic anemia, otherwise unremarkable.  Suspect symptoms are consistent with viral gastritis.  Patient was given 1 L normal saline bolus in clinic and zofran prn for nausea.  Advised patient that illness is likely viral and should improve on its own. Return precautions given for persistent or worsening symptoms. --1 L normal saline bolus -Zofran PRN for nausea - Follow-up as needed

## 2019-02-08 NOTE — Progress Notes (Signed)
   CC: Cough, nausea, vomiting   HPI:  [name] is a 58 y.o. male with past medical history outlined below here for cough, nausea, and vomiting. For the details of today's visit, please refer to the assessment and plan.  Past Medical History:  Diagnosis Date  . Anemia   . CHF (congestive heart failure) ([place])   . GERD (gastroesophageal reflux disease)   . Hypertension   . Thalassemia, alpha ([place])     Review of Systems  Respiratory: Positive for cough.   Gastrointestinal: Positive for constipation, nausea and vomiting.   Physical Exam:  Vitals:   02/08/19 1332  BP: (!) 159/96  Pulse: (!) 126  Temp: 99.7 F (37.6 C)  TempSrc: Oral  SpO2: 95%  Weight: 140 lb 14.4 oz (63.9 kg)  Height: 5\' 2"  (1.575 m)    Constitutional: NAD, appears comfortable Cardiovascular: Tachycardic but regular, no m/r/g Pulmonary/Chest: Faint crackles over the left lung base, otherwise clear to ascultation throughout.  Abdominal: Soft, non tender, non distended. Hypoactive bowel sounds.  Psychiatric: Normal mood and affect  Assessment & Plan:   See Encounters Tab for problem based charting.  Patient discussed with Dr. Eppie Gibson

## 2019-02-08 NOTE — Assessment & Plan Note (Signed)
Patient is complaining of abdominal distention and constipation.  KUB was obtained in the setting of his new onset nausea and vomiting, negative for evidence of ileus or obstruction.  He continues to have bowel movements but describes his stool as small hard pellets.  Recommended daily stool softeners and adequate hydration in the setting of his viral illness.  --Daily Colace

## 2019-02-08 NOTE — Progress Notes (Signed)
Case discussed with Dr. Guilloud at the time of the visit. We reviewed the resident's history and exam and pertinent patient test results. I agree with the assessment, diagnosis, and plan of care documented in the resident's note. 

## 2019-02-08 NOTE — Patient Instructions (Signed)
Mr. Vandenberghe,  I am sorry to hear you are not feeling well. I suspect you have a viral infection making you sick. This will get better on it's own. I have given you a medication called zofran (aka ondansetron) that you may take every 8 hours as needed for nausea.   For your constipation, please take colace (aka docusate) once a day until your bowel movements are regular again.   Follow up with [place] as needed if your symptoms fail to improve.   Please schedule another appointment for your shoulder pain.   If you have any questions or concerns, call our clinic at [phone] or after hours call [phone] and ask for the internal medicine resident on call. Thank you!  Dr. Philipp Ovens

## 2019-02-16 ENCOUNTER — Encounter: Payer: Self-pay | Admitting: Internal Medicine

## 2019-02-16 ENCOUNTER — Ambulatory Visit (INDEPENDENT_AMBULATORY_CARE_PROVIDER_SITE_OTHER): Payer: Self-pay | Admitting: Internal Medicine

## 2019-02-16 ENCOUNTER — Other Ambulatory Visit: Payer: Self-pay

## 2019-02-16 VITALS — BP 143/84 | HR 104 | Temp 97.8°F | Ht 62.0 in | Wt 136.0 lb

## 2019-02-16 DIAGNOSIS — J321 Chronic frontal sinusitis: Secondary | ICD-10-CM

## 2019-02-16 DIAGNOSIS — M25512 Pain in left shoulder: Secondary | ICD-10-CM

## 2019-02-16 DIAGNOSIS — R062 Wheezing: Secondary | ICD-10-CM

## 2019-02-16 DIAGNOSIS — J329 Chronic sinusitis, unspecified: Secondary | ICD-10-CM

## 2019-02-16 MED ORDER — AMOXICILLIN-POT CLAVULANATE 875-125 MG PO TABS: 1.0000 | ORAL_TABLET | Freq: Two times a day (BID) | ORAL | 0 refills | Status: AC

## 2019-02-16 MED ORDER — IBUPROFEN 200 MG PO TABS: 400.0000 mg | ORAL_TABLET | Freq: Three times a day (TID) | ORAL | 2 refills | Status: DC | PRN

## 2019-02-16 MED ORDER — ALBUTEROL SULFATE (2.5 MG/3ML) 0.083% IN NEBU
2.5000 mg | INHALATION_SOLUTION | Freq: Once | RESPIRATORY_TRACT | Status: AC
  Administered 2019-02-16: 2.5 mg via RESPIRATORY_TRACT

## 2019-02-16 MED ORDER — IPRATROPIUM BROMIDE 0.02 % IN SOLN
0.5000 mg | Freq: Once | RESPIRATORY_TRACT | Status: AC
  Administered 2019-02-16: 0.5 mg via RESPIRATORY_TRACT

## 2019-02-16 NOTE — Assessment & Plan Note (Signed)
Pain for two weeks in the left shoulder and trapezial and subscapular area for the past two weeks. He states he fell and passed out many years ago and has had pain in the shoulder off and on since that time. He states the pain radiates down his arm and sometimes causes numbness and tingling. He has pain in the region of the bicep as well and is unable to life his arm above his head. He has not had trouble dropping or with grasping objects. His pain appears likely secondary to impingement. He has tried cold ice packs which often help.   - cont. Cold ice packs - Ibuprofen 400 mg q6h prn for seven days. Advised him to not take more than this as he has history of GERD. If symptoms do not improve he may need joint injection.  - f/u one week.

## 2019-02-16 NOTE — Progress Notes (Signed)
   CC: left shoulder pain  HPI:  [name] is a Montagnard speaking 58 y.o. male with PMH as below presenting today for follow-up of shoulder pain.   Please see A&P for assessment of the patient's acute and chronic medical conditions.   Past Medical History:  Diagnosis Date  . Anemia   . CHF (congestive heart failure) ([place])   . GERD (gastroesophageal reflux disease)   . Hypertension   . Thalassemia, alpha ([place])    Review of Systems:   Review of Systems  Constitutional: Positive for diaphoresis. Negative for chills, fever and malaise/fatigue.  HENT: Positive for congestion, nosebleeds and sinus pain. Negative for ear discharge and ear pain.   Eyes: Negative for photophobia and pain.  Respiratory: Positive for cough and sputum production. Negative for hemoptysis, shortness of breath, wheezing and stridor.   Cardiovascular: Negative for chest pain and leg swelling.  Gastrointestinal: Negative for diarrhea, nausea and vomiting.  Musculoskeletal: Positive for back pain, myalgias and neck pain.  Neurological: Negative for dizziness, sensory change and headaches.   Physical Exam:  Constitution: NAD, well-nourished HENT: erythema of nasal passages, no erythema of throat, TM without swelling or tenderness, no lymphadenopathy, +congestion and maxillary sinus tenderness, no frontal tenderness Eyes: no icterus or injection  Cardio: tachycardic, regular rhythm, no murmurs, radial pulses intact Respiratory: +wheezing bilaterally, crackles bilateral lower lobes Abdominal: NTTP, non-distended MSK: Left limited ROM abduction to only shoulder height with pain, pain with resisted adduction, positive empty can test Right: normal ROM  Sensation intact bilaterally  Skin: c/d/i    Vitals:   02/16/19 1328  BP: (!) 143/84  Pulse: (!) 104  Temp: 97.8 F (36.6 C)  TempSrc: Axillary  SpO2: 95%  Weight: 136 lb (61.7 kg)  Height: 5\' 2"  (1.575 m)     Assessment & Plan:   See Encounters  Tab for problem based charting.  Patient discussed with Dr. Dareen Piano

## 2019-02-16 NOTE — Patient Instructions (Addendum)
Thank you for allowing [place] to provide your care today. Today we discussed your chronic sinusitis and shoulder pain.     Please call your Ear, Nose and Throat Doctor Dr. Leonides Sake. [address] [place], [place] [address] [phone]        Today we made the following changes to your medications:   Please START taking  For your shoulder pain, please take:  Ibuprofen 400 MG every six hours as needed for pain for the next 7 days.    Amoxicillin-clavulanate (AUGMENTIN) 875-125 MG tablet every 12 hours for 7 days    Please use your Albuterol Inhaler with spacer two puffs twice per day for wheezing and shortness of breath.     Please follow-up in one week if symptoms do not improve.    Should you have any questions or concerns please call the internal medicine clinic at [phone].    Shoulder Pain Many things can cause shoulder pain, including:  An injury.  Moving the shoulder in the same way again and again (overuse).  Joint pain (arthritis). Pain can come from:  Swelling and irritation (inflammation) of any part of the shoulder.  An injury to the shoulder joint.  An injury to: ? Tissues that connect muscle to bone (tendons). ? Tissues that connect bones to each other (ligaments). ? Bones. Follow these instructions at home: Watch for changes in your symptoms. Let your doctor know about them. Follow these instructions to help with your pain. If you have a sling:  Wear the sling as told by your doctor. Remove it only as told by your doctor.  Loosen the sling if your fingers: ? Tingle. ? Become numb. ? Turn cold and blue.  Keep the sling clean.  If the sling is not waterproof: ? Do not let it get wet. ? Take the sling off when you shower or bathe. Managing pain, stiffness, and swelling   If told, put ice on the painful area: ? Put ice in a plastic bag. ? Place a towel between your skin and the bag. ? Leave the ice on for 20 minutes, 2-3  times a day. Stop putting ice on if it does not help with the pain.  Squeeze a soft ball or a foam pad as much as possible. This prevents swelling in the shoulder. It also helps to strengthen the arm. General instructions  Take over-the-counter and prescription medicines only as told by your doctor.  Keep all follow-up visits as told by your doctor. This is important. Contact a doctor if:  Your pain gets worse.  Medicine does not help your pain.  You have new pain in your arm, hand, or fingers. Get help right away if:  Your arm, hand, or fingers: ? Tingle. ? Are numb. ? Are swollen. ? Are painful. ? Turn white or blue. Summary  Shoulder pain can be caused by many things. These include injury, moving the shoulder in the same away again and again, and joint pain.  Watch for changes in your symptoms. Let your doctor know about them.  This condition may be treated with a sling, ice, and pain medicine.  Contact your doctor if the pain gets worse or you have new pain. Get help right away if your arm, hand, or fingers tingle or get numb, swollen, or painful.  Keep all follow-up visits as told by your doctor. This is important. This information is not intended to replace advice given to you by your health care provider. Make  sure you discuss any questions you have with your health care provider. Document Released: 05/18/2008 Document Revised: 06/14/2018 Document Reviewed: 06/14/2018 Elsevier Interactive Patient Education  2019 Elsevier Inc.  Sinusitis, Adult Sinusitis is soreness and swelling (inflammation) of your sinuses. Sinuses are hollow spaces in the bones around your face. They are located:  Around your eyes.  In the middle of your forehead.  Behind your nose.  In your cheekbones. Your sinuses and nasal passages are lined with a fluid called mucus. Mucus drains out of your sinuses. Swelling can trap mucus in your sinuses. This lets germs (bacteria, virus, or fungus)  grow, which leads to infection. Most of the time, this condition is caused by a virus. What are the causes? This condition is caused by:  Allergies.  Asthma.  Germs.  Things that block your nose or sinuses.  Growths in the nose (nasal polyps).  Chemicals or irritants in the air.  Fungus (rare). What increases the risk? You are more likely to develop this condition if:  You have a weak body defense system (immune system).  You do a lot of swimming or diving.  You use nasal sprays too much.  You smoke. What are the signs or symptoms? The main symptoms of this condition are pain and a feeling of pressure around the sinuses. Other symptoms include:  Stuffy nose (congestion).  Runny nose (drainage).  Swelling and warmth in the sinuses.  Headache.  Toothache.  A cough that may get worse at night.  Mucus that collects in the throat or the back of the nose (postnasal drip).  Being unable to smell and taste.  Being very tired (fatigue).  A fever.  Sore throat.  Bad breath. How is this diagnosed? This condition is diagnosed based on:  Your symptoms.  Your medical history.  A physical exam.  Tests to find out if your condition is short-term (acute) or long-term (chronic). Your doctor may: ? Check your nose for growths (polyps). ? Check your sinuses using a tool that has a light (endoscope). ? Check for allergies or germs. ? Do imaging tests, such as an MRI or CT scan. How is this treated? Treatment for this condition depends on the cause and whether it is short-term or long-term.  If caused by a virus, your symptoms should go away on their own within 10 days. You may be given medicines to relieve symptoms. They include: ? Medicines that shrink swollen tissue in the nose. ? Medicines that treat allergies (antihistamines). ? A spray that treats swelling of the nostrils. ? Rinses that help get rid of thick mucus in your nose (nasal saline washes).  If  caused by bacteria, your doctor may wait to see if you will get better without treatment. You may be given antibiotic medicine if you have: ? A very bad infection. ? A weak body defense system.  If caused by growths in the nose, you may need to have surgery. Follow these instructions at home: Medicines  Take, use, or apply over-the-counter and prescription medicines only as told by your doctor. These may include nasal sprays.  If you were prescribed an antibiotic medicine, take it as told by your doctor. Do not stop taking the antibiotic even if you start to feel better. Hydrate and humidify   Drink enough water to keep your pee (urine) pale yellow.  Use a cool mist humidifier to keep the humidity level in your home above 50%.  Breathe in steam for 10-15 minutes, 3-4 times a  day, or as told by your doctor. You can do this in the bathroom while a hot shower is running.  Try not to spend time in cool or dry air. Rest  Rest as much as you can.  Sleep with your head raised (elevated).  Make sure you get enough sleep each night. General instructions   Put a warm, moist washcloth on your face 3-4 times a day, or as often as told by your doctor. This will help with discomfort.  Wash your hands often with soap and water. If there is no soap and water, use hand sanitizer.  Do not smoke. Avoid being around people who are smoking (secondhand smoke).  Keep all follow-up visits as told by your doctor. This is important. Contact a doctor if:  You have a fever.  Your symptoms get worse.  Your symptoms do not get better within 10 days. Get help right away if:  You have a very bad headache.  You cannot stop throwing up (vomiting).  You have very bad pain or swelling around your face or eyes.  You have trouble seeing.  You feel confused.  Your neck is stiff.  You have trouble breathing. Summary  Sinusitis is swelling of your sinuses. Sinuses are hollow spaces in the bones  around your face.  This condition is caused by tissues in your nose that become inflamed or swollen. This traps germs. These can lead to infection.  If you were prescribed an antibiotic medicine, take it as told by your doctor. Do not stop taking it even if you start to feel better.  Keep all follow-up visits as told by your doctor. This is important. This information is not intended to replace advice given to you by your health care provider. Make sure you discuss any questions you have with your health care provider. Document Released: 05/18/2008 Document Revised: 05/02/2018 Document Reviewed: 05/02/2018 Elsevier Interactive Patient Education  2019 [employer].

## 2019-02-16 NOTE — Assessment & Plan Note (Addendum)
Seen last week for flare of sinusitis symptoms which he has chronically and underwent surgery for in 2016 although I cannot find the exact procedure. Symptoms are ongoing although they have improved slightly. He previously saw ENT and has been through antibiotic treatment multiple time. He was supposed to follow-up with Dr. Lucia Gaskins several months ago if symptoms did not improve but he did not have a number to contact him. Flonase and claritin were continued for symptoms last week. This week his symptoms seem more extensive and he additionally had cough, increased crackles bilaterally with wheezing as well. He has mild tachycardia as well.   - symptoms improved with duonebulization treatment  - he needs follow-up with ENT but may also need f/u with pulmonology if his wheezing continues - given albuterol inhaler with spacer and detailed instruction and demonstration in how to use this  - augmentin 875-125 BID for seven days as his symptoms of sinusitis have been occurring more than 7 days now. - provided contact information for Dr. Chauncy Passy - f/u one week

## 2019-02-16 NOTE — Assessment & Plan Note (Signed)
See Chronic sinusitis for details   - duoneb treatment with improvement - albuterol sample with spacer and education

## 2019-02-17 NOTE — Progress Notes (Signed)
Internal Medicine Clinic Attending  Case discussed with Dr. Seawell at the time of the visit.  We reviewed the resident's history and exam and pertinent patient test results.  I agree with the assessment, diagnosis, and plan of care documented in the resident's note.    

## 2019-02-23 ENCOUNTER — Ambulatory Visit (INDEPENDENT_AMBULATORY_CARE_PROVIDER_SITE_OTHER): Payer: Self-pay | Admitting: Internal Medicine

## 2019-02-23 ENCOUNTER — Other Ambulatory Visit: Payer: Self-pay

## 2019-02-23 ENCOUNTER — Encounter: Payer: Self-pay | Admitting: Internal Medicine

## 2019-02-23 VITALS — BP 137/83 | HR 86 | Temp 98.1°F | Ht 62.0 in | Wt 135.0 lb

## 2019-02-23 DIAGNOSIS — J328 Other chronic sinusitis: Secondary | ICD-10-CM

## 2019-02-23 DIAGNOSIS — Z79899 Other long term (current) drug therapy: Secondary | ICD-10-CM

## 2019-02-23 DIAGNOSIS — J321 Chronic frontal sinusitis: Secondary | ICD-10-CM

## 2019-02-23 MED ORDER — SALINE SPRAY 0.65 % NA SOLN: 1.0000 | NASAL | 1 refills | Status: DC | PRN

## 2019-02-23 NOTE — Progress Notes (Addendum)
   CC: Follow-up sinusitis  HPI:  [name] is a 58 y.o. with medical history listed below presenting for follow-up sinusitis  Please see problem based charting for further details.  Past Medical History:  Diagnosis Date  . Anemia   . CHF (congestive heart failure) ([place])   . GERD (gastroesophageal reflux disease)   . Hypertension   . Thalassemia, alpha ([place])    Review of Systems: As per HPI  Physical Exam:  Vitals:   02/23/19 1329  BP: 137/83  Pulse: 86  Temp: 98.1 F (36.7 C)  TempSrc: Oral  SpO2: 98%  Weight: 135 lb (61.2 kg)  Height: 5\' 2"  (1.575 m)   Physical Exam Vitals signs and nursing note reviewed.  Constitutional:      Appearance: Normal appearance.  HENT:     Head: Normocephalic and atraumatic.     Nose: Nasal tenderness (Left maxillary sinus area ) present. No nasal deformity, septal deviation, laceration, mucosal edema or rhinorrhea.     Left Nostril: Epistaxis (With mucosal erosion, Old blood ) present.     Right Turbinates: Not enlarged, swollen or pale.     Left Turbinates: Not enlarged, swollen or pale.     Left Sinus: Maxillary sinus tenderness present. No frontal sinus tenderness.  Cardiovascular:     Rate and Rhythm: Normal rate.     Heart sounds: Normal heart sounds. No murmur. No gallop.   Pulmonary:     Breath sounds: No wheezing, rhonchi or rales.  Abdominal:     Tenderness: There is no abdominal tenderness.  Neurological:     Mental Status: He is alert.     Assessment & Plan:   See Encounters Tab for problem based charting.  Patient discussed with Dr. Rebeca Alert

## 2019-02-23 NOTE — Patient Instructions (Addendum)
*  The instructions below were verbally interpreted with the help [place]ard translator*  Mr. Koelzer,   It was a pleasure taking care of you at the clinic today.  I know it is quite unfortunate to continue having the nasal congestion and the intermittent bleeding from your left nostril.  I am adding another medication called intranasal normal saline which you would squirt into your nose in the morning about an hour before squirting the Flonase.  We also making every attempt for you to have an appointment with the ENT doctor.    We have made an appointment for you to see a gastroenterologist on February 28, 2019 at the [facility]  Dr. Eileen Stanford  Please call the internal medicine center clinic if you have any questions or concerns, we may be able to help and keep you from a long and expensive emergency room wait. Our clinic and after hours phone number is [phone], the best time to call is Monday through Friday 9 am to 4 pm but there is always someone available 24/7 if you have an emergency. If you need medication refills please notify your pharmacy one week in advance and they will send [place] a request.

## 2019-02-23 NOTE — Assessment & Plan Note (Signed)
Chronic sinusitis of ethmoid and frontal region: [name] is a Montagnard speaking gentleman who presents today to follow-up on a longstanding history of chronic sinusitis.  Per chart review, he has had follow-up with ENT and had been treated conservatively with Flonase, antihistamine as well as been on multiple antibiotics.  He was examined by Dr. Sharon Seller on February 16, 2019 with complaints of nasal congestion and maxillary sinus pain.  During the visit he was given a 7-day course of Augmentin 875-125 mg twice daily and also provided a contact number to follow-up with Dr. Lucia Gaskins (ENT).  He has also been evaluated by Dr. Philipp Ovens when he complained of cough sputum production.  Chest x-ray was ordered which showed no active cardiopulmonary symptoms.  Today, he continues to endorse left-sided nosebleeding though not acute and  left-sided maxilla pain but however presently denies fevers, chills.  He tells me today that he has not been able to have any assistance to call Dr. Pollie Friar office. Per Dr. Pollie Friar note from August 2019, Eric Ingram will have to be reevaluated for possible surgical intervention if symptoms continue.  Plan: - Prescribed intranasal saline - Follow-up with Dr. Lucia Gaskins (we tried to make an appointment however ENTs office made [place] aware that patient would have to pay $100 to be seen)

## 2019-02-24 NOTE — Progress Notes (Signed)
Internal Medicine Clinic Attending  Case discussed with Dr. Agyei at the time of the visit.  We reviewed the resident's history and exam and pertinent patient test results.  I agree with the assessment, diagnosis, and plan of care documented in the resident's note.  Alexander Raines, M.D., Ph.D.  

## 2019-02-28 ENCOUNTER — Ambulatory Visit: Payer: Self-pay | Admitting: Gastroenterology

## 2019-03-20 ENCOUNTER — Encounter: Payer: Self-pay | Admitting: Internal Medicine

## 2019-03-30 ENCOUNTER — Other Ambulatory Visit: Payer: Self-pay

## 2019-03-30 MED ORDER — PANTOPRAZOLE SODIUM 40 MG PO TBEC: 40.0000 mg | DELAYED_RELEASE_TABLET | Freq: Two times a day (BID) | ORAL | 2 refills | Status: DC

## 2019-03-30 NOTE — Telephone Encounter (Signed)
pantoprazole (PROTONIX) 40 MG tablet, refill request @ [facility].

## 2019-04-12 ENCOUNTER — Other Ambulatory Visit: Payer: Self-pay | Admitting: *Deleted

## 2019-04-12 MED ORDER — LISINOPRIL 40 MG PO TABS: 40.0000 mg | ORAL_TABLET | Freq: Every day | ORAL | 3 refills | Status: DC

## 2019-04-12 NOTE — Telephone Encounter (Signed)
Call from Jake Michaelis, congregational nurse, requesting lisinopril be sent to [place] so pt can pick rxs at the same place.

## 2019-04-12 NOTE — Congregational Nurse Program (Addendum)
CN office visit with interpreter Diu Hartshorn assisting. Patient states he needs refills of his Lisinopril and Pantoprazole.  He went to [facility] and it was closed due to Covid-19.  Phone call to [facility] at [facility] Medicine clinic and she will call in refills to the [facility].  He will pick them up today.  He also brought medical bills from [facility] and needs financial assistance because he is uninsured and has no income.  CN called finance office and patient was screened for eligibility.  He will be mailed a packet which must be completed and returned with all documents requested within 20 days.  He also needs help completing an interview with [facility].  CN called and made appointment for next Wednesday at 11:30 to do virtual interview and Diu will provide the interpretation.  He states he cannot wear the upper denture he received because it is painful  He will bring it next week and we will contact the provider. Jake Michaelis RN, Congregational Nurse [phone]

## 2019-04-15 ENCOUNTER — Other Ambulatory Visit: Payer: Self-pay | Admitting: Internal Medicine

## 2019-04-19 NOTE — Congregational Nurse Program (Signed)
CN office visit with interpreter Diu Hartshorn assisting.  Patient states when he went to pick up his medicine last week at [place] they did not give him BP medicine, only stomach medicine.  Phone call to pharmacy.  They did not receive order for Lisinopril until 2 days ago but it is now ready and the cost will be $4.  He plans to pick it up tomorrow.  BP today is 126/86. States he continues to have ongoing stomach pain with nausea but it is no worse and medication helps.  States he has not been able to adjust to wearing upper denture.  He is unable to talk or eat with it so he doesn't wear it.  CN attempted to call dental clinic but they are closed until next week due to Covid-19 pandemic.  He says he has an appointment sometime in May either there or with his regular dentist.  We helped him complete a [facility] Financial assistance application today and he will return next week with the remaining documents. Ms. Bernadene Bell interpreted for him via ZOOM with [facility] Immigration lawyer who is assisting him with his citizenship application.  Jake Michaelis RN, Congregational Nurse [phone]

## 2019-04-26 ENCOUNTER — Other Ambulatory Visit: Payer: Self-pay | Admitting: Internal Medicine

## 2019-04-26 ENCOUNTER — Telehealth: Payer: Self-pay | Admitting: Internal Medicine

## 2019-04-26 DIAGNOSIS — J321 Chronic frontal sinusitis: Secondary | ICD-10-CM

## 2019-04-26 NOTE — Congregational Nurse Program (Signed)
CN office visit. Patient states he is having problems with nasal congestion and pain.  Phone call to ENT Dr. Mickie Hillier office where he was seen last year.  Patient would have to pay $185 and he is unable to afford to go.  Phone call to PCP office at [facility] to inform them of patient's problem.  We also completed the [facility] Financial Assistance application and he plans to mail it tomorrow after he gets his wife's updated 401K statement.  Jake Michaelis RN, Congregational Nurse [phone]

## 2019-04-26 NOTE — Telephone Encounter (Signed)
Pt needs a new referral to the ENT that accept [facility] or [facility]; pt contact [phone]

## 2019-05-15 ENCOUNTER — Other Ambulatory Visit: Payer: Self-pay | Admitting: Internal Medicine

## 2019-05-15 DIAGNOSIS — J329 Chronic sinusitis, unspecified: Secondary | ICD-10-CM

## 2019-05-15 NOTE — Telephone Encounter (Signed)
Refill Request   loratadine (CLARITIN) 10 MG tablet  [place], [place]

## 2019-06-11 ENCOUNTER — Encounter: Payer: Self-pay | Admitting: *Deleted

## 2019-06-14 NOTE — Congregational Nurse Program (Signed)
CN office visit with interpreter Diu Hartshorn assisting.  Patient needed help with a bill he had received from [facility] because his discount had not been applied.  We called [facility] Billing office and they determined the correct amount he owes.  States he will pay it next week.  He had received an updated Financial assistance discount letter for 75% valid until 10/27/2019 but his orange card expired on 06/03/2019.  CN called [facility] clinic who will mail him a new orange card which will have the same expiration date as his [facility] discount.  Jake Michaelis RN, Congregational Nurse [phone]

## 2019-06-22 ENCOUNTER — Other Ambulatory Visit: Payer: Self-pay | Admitting: *Deleted

## 2019-06-22 DIAGNOSIS — J329 Chronic sinusitis, unspecified: Secondary | ICD-10-CM

## 2019-06-22 MED ORDER — LORATADINE 10 MG PO TABS: 10.0000 mg | ORAL_TABLET | Freq: Every day | ORAL | 3 refills | Status: DC

## 2019-06-22 MED ORDER — PANTOPRAZOLE SODIUM 40 MG PO TBEC: 40.0000 mg | DELAYED_RELEASE_TABLET | Freq: Two times a day (BID) | ORAL | 2 refills | Status: DC

## 2019-07-05 ENCOUNTER — Ambulatory Visit: Payer: Self-pay

## 2019-07-05 ENCOUNTER — Other Ambulatory Visit: Payer: Self-pay

## 2019-07-05 ENCOUNTER — Emergency Department (HOSPITAL_COMMUNITY)
Admission: EM | Admit: 2019-07-05 | Discharge: 2019-07-05 | Disposition: A | Discharge status: Home / Self Care | Payer: Self-pay | Attending: Emergency Medicine | Admitting: Emergency Medicine

## 2019-07-05 ENCOUNTER — Encounter (HOSPITAL_COMMUNITY): Payer: Self-pay | Admitting: Emergency Medicine

## 2019-07-05 DIAGNOSIS — Z79899 Other long term (current) drug therapy: Secondary | ICD-10-CM | POA: Insufficient documentation

## 2019-07-05 DIAGNOSIS — I110 Hypertensive heart disease with heart failure: Secondary | ICD-10-CM | POA: Insufficient documentation

## 2019-07-05 DIAGNOSIS — B029 Zoster without complications: Secondary | ICD-10-CM | POA: Insufficient documentation

## 2019-07-05 DIAGNOSIS — I5032 Chronic diastolic (congestive) heart failure: Secondary | ICD-10-CM | POA: Insufficient documentation

## 2019-07-05 DIAGNOSIS — Z87891 Personal history of nicotine dependence: Secondary | ICD-10-CM | POA: Insufficient documentation

## 2019-07-05 MED ORDER — ACYCLOVIR 800 MG PO TABS: 800.0000 mg | ORAL_TABLET | Freq: Every day | ORAL | 0 refills | Status: DC

## 2019-07-05 MED ORDER — ACYCLOVIR 800 MG PO TABS: 800.0000 mg | ORAL_TABLET | Freq: Every day | ORAL | 0 refills | Status: AC

## 2019-07-05 NOTE — ED Notes (Signed)
Luellen Pucker RN documented wrong triage not under patient regarding [facility].

## 2019-07-05 NOTE — ED Provider Notes (Signed)
[place] EMERGENCY DEPARTMENT Provider Note   CSN: [id number] Arrival date & time: 07/05/19  1338    History   Chief Complaint No chief complaint on file.   HPI Eric Ingram is a 58 y.o. male.     58 y.o male with a PMH of CHF, GERD, HTN presents to the ED with a chief complaint of rash. Patient reports he began experiencing a tingling sensation to his left chest, states symptoms then worsened.  Now he has a vesicular rash along his left arm and chest.  He reports applying water to help with the pain.  Has not taken any medication for his symptoms.  He denies any fevers, mucosal involvement, previous history of shingles.   The history is provided by the patient. The history is limited by a language barrier. A language interpreter was used.    Past Medical History:  Diagnosis Date  . Anemia   . CHF (congestive heart failure) ([place])   . GERD (gastroesophageal reflux disease)   . Hypertension   . Thalassemia, alpha [facility])     Patient Active Problem List   Diagnosis Date Noted  . Wheezing on both sides of chest 02/16/2019  . Tachycardia 02/08/2019  . Cough 02/08/2019  . Constipation 02/08/2019  . Viral gastritis 02/08/2019  . Shoulder pain, left 02/08/2019  . Abdominal bloating 11/07/2018  . Dental infection 08/02/2018  . Acute on chronic diastolic heart failure ([place])   . Iron deficiency anemia due to chronic blood loss   . Chronic sinusitis 01/05/2018  . Health care maintenance 01/05/2018  . Hypertension 10/16/2017    Past Surgical History:  Procedure Laterality Date  . NO PAST SURGERIES          Home Medications    Prior to Admission medications   Medication Sig Start Date End Date Taking? Authorizing Provider  acyclovir (ZOVIRAX) 800 MG tablet Take 1 tablet (800 mg total) by mouth 5 (five) times daily for 7 days. 07/05/19 07/12/19  Janeece Fitting, PA-C  docusate sodium (COLACE) 50 MG capsule Take 1 capsule (50 mg total) by mouth daily. 02/08/19    Velna Ochs, MD  fluticasone (FLONASE) 50 MCG/ACT nasal spray Place 2 sprays into both nostrils daily. 10/11/18 04/09/19  Kathi Ludwig, MD  ibuprofen (ADVIL) 200 MG tablet Take 2 tablets (400 mg total) by mouth every 8 (eight) hours as needed. 02/16/19 02/16/20  Seawell, Jaimie A, DO  lisinopril (ZESTRIL) 40 MG tablet TAKE 1 TABLET (40 MG TOTAL) BY MOUTH DAILY. 04/17/19   Aldine Contes, MD  loratadine (CLARITIN) 10 MG tablet Take 1 tablet (10 mg total) by mouth daily. 06/22/19   Aldine Contes, MD  naproxen (NAPROSYN) 500 MG tablet Take 1 tablet (500 mg total) by mouth 2 (two) times daily as needed. 11/07/18 11/07/19  Lorella Nimrod, MD  ondansetron (ZOFRAN-ODT) 8 MG disintegrating tablet Take 1 tablet (8 mg total) by mouth every 8 (eight) hours as needed for nausea or vomiting. 02/08/19   Velna Ochs, MD  pantoprazole (PROTONIX) 40 MG tablet Take 1 tablet (40 mg total) by mouth 2 (two) times daily. 06/22/19   Aldine Contes, MD  polyethylene glycol (MIRALAX) packet Take 17 g by mouth daily. 11/07/18   Lorella Nimrod, MD  sodium chloride (OCEAN) 0.65 % SOLN nasal spray Place 1 spray into both nostrils as needed for congestion. 02/23/19   Jean Rosenthal, MD  sucralfate (CARAFATE) 1 g tablet Take 1 tablet (1 g total) by mouth every 6 (six) hours  as needed. Slowly dissolve 1 tablet in 1 tablespoon of water before taking 11/03/18   Lorella Nimrod, MD    Family History Family History  Problem Relation Age of Onset  . Colon cancer Neg Hx   . Stomach cancer Neg Hx     Social History Social History   Tobacco Use  . Smoking status: Former Smoker    Quit date: 12/15/1999    Years since quitting: 19.5  . Smokeless tobacco: Never Used  Substance Use Topics  . Alcohol use: No    Frequency: Never    Comment: former  . Drug use: No     Allergies   Patient has no known allergies.   Review of Systems Review of Systems  Constitutional: Negative for fever.  Skin: Positive for  rash.     Physical Exam Updated Vital Signs BP (!) 146/96 (BP Location: Right Arm)   Pulse 85   Temp 98.5 F (36.9 C) (Oral)   Resp 14   SpO2 100%   Physical Exam Vitals signs and nursing note reviewed.  Constitutional:      Appearance: He is well-developed.  HENT:     Head: Normocephalic and atraumatic.  Eyes:     General: No scleral icterus.    Pupils: Pupils are equal, round, and reactive to light.  Neck:     Musculoskeletal: Normal range of motion.  Cardiovascular:     Heart sounds: Normal heart sounds.  Pulmonary:     Effort: Pulmonary effort is normal.     Breath sounds: Normal breath sounds. No wheezing.  Chest:     Chest wall: No tenderness.  Abdominal:     General: Bowel sounds are normal. There is no distension.     Palpations: Abdomen is soft.     Tenderness: There is no abdominal tenderness.  Musculoskeletal:        General: No tenderness or deformity.  Skin:    General: Skin is warm and dry.     Findings: Erythema and rash present. Rash is vesicular.          Comments: Vesicular rash noted on chest with scattered pattern along dermatomal region.   Neurological:     Mental Status: He is alert and oriented to person, place, and time.      ED Treatments / Results  Labs (all labs ordered are listed, but only abnormal results are displayed) Labs Reviewed - No data to display  EKG None  Radiology No results found.  Procedures Procedures (including critical care time)  Medications Ordered in ED Medications - No data to display   Initial Impression / Assessment and Plan / ED Course  I have reviewed the triage vital signs and the nursing notes.  Pertinent labs & imaging results that were available during my care of the patient were reviewed by me and considered in my medical decision making (see chart for details).    Patient with extensive prior medical history presents to the ED with complaints of rash, reports this started along the left  chest with now left arm involvement.  Reports vesicular in nature, no fevers, facial involvement.  Has applied water to the area without improvement in symptoms.  No other therapy has been attempted.  During primary evaluation patient has a vesicular rash in a dermatomal pattern consistent with shingles.  No eye involvement, no ear involvement or nose.  I have also discussed patient with Dr. Sherry Ruffing who has also seen and evaluated patient.  I feel patient  is appropriate for outpatient therapy with acyclovir, 1 capsule 5 times a day for the next 7 days.  Interpreter at the bedside help with translating, patient is aware of plan.  Return precautions discussed at length.  Portions of this note were generated with [employer]. Dictation errors may occur despite best attempts at proofreading.    Final Clinical Impressions(s) / ED Diagnoses   Final diagnoses:  Herpes zoster without complication    ED Discharge Orders         Ordered    acyclovir (ZOVIRAX) 800 MG tablet  5 times daily     07/05/19 1434           Janeece Fitting, [place] 07/05/19 1512    Tegeler, Gwenyth Allegra, MD 07/05/19 [phone]

## 2019-07-05 NOTE — ED Triage Notes (Signed)
Pt sent by [facility] for enough med refill until he can go to [place] next week

## 2019-07-05 NOTE — Discharge Instructions (Addendum)
I have prescribed medication to help treat your condition, please take 1 tablet 5 times a day for the next 7 days.  Please follow-up with your primary care physician as needed.  If you experience any fever, worsening symptoms please return to the emergency department.

## 2019-07-05 NOTE — ED Triage Notes (Signed)
Patient arrives POV with interpreter at bedside reporting rash to left arm, chest and left shoulder blade.

## 2019-07-07 ENCOUNTER — Ambulatory Visit (INDEPENDENT_AMBULATORY_CARE_PROVIDER_SITE_OTHER): Payer: Self-pay | Admitting: Internal Medicine

## 2019-07-07 ENCOUNTER — Telehealth: Payer: Self-pay | Admitting: *Deleted

## 2019-07-07 VITALS — BP 133/88 | HR 89 | Temp 99.2°F | Ht 62.0 in | Wt 129.1 lb

## 2019-07-07 DIAGNOSIS — B029 Zoster without complications: Secondary | ICD-10-CM

## 2019-07-07 HISTORY — DX: Zoster without complications: B02.9

## 2019-07-07 MED ORDER — ACETAMINOPHEN-CODEINE #3 300-30 MG PO TABS: 1.0000 | ORAL_TABLET | ORAL | 0 refills | Status: DC | PRN

## 2019-07-07 NOTE — Progress Notes (Signed)
   CC: left arm, chest, back rash  HPI:  Eric Ingram is a 58 y.o. with PMH as below presenting with one week of pain and rash along his left chest and arm that has now spread to his back as well. Telephone translator was used throughout visit. He went to the ER three days ago and was given acyclovir. He has been taking this as directed. He continues to have pain in the area of distribution and slightly below the rash on his chest. He had nausea one week ago but not since. He denies drainage, fever, difficulty with urination, or any pain or rash on his face. No pruritis.   Please see A&P for assessment of the patient's acute and chronic medical conditions.   Past Medical History:  Diagnosis Date  . Anemia   . CHF (congestive heart failure) ([place])   . GERD (gastroesophageal reflux disease)   . Hypertension   . Thalassemia, alpha ([place])    Review of Systems:   Review of Systems  Constitutional: Negative for chills and fever.  Genitourinary: Negative for dysuria, frequency and urgency.  Skin: Positive for rash. Negative for itching.       +burning    Physical Exam:  Constitution: NAD, appears stated age HENT: [place]/AT Neuro: alert & oriented, normal affect  Skin: vesicular rash along T2 dermatome with erythema, chest with scabbing, no drainage   Vitals:   07/07/19 1014  BP: 133/88  Pulse: 89  Temp: 99.2 F (37.3 C)  TempSrc: Oral  SpO2: 100%  Weight: 129 lb 1.6 oz (58.6 kg)  Height: 5\' 2"  (1.575 m)    Assessment & Plan:   See Encounters Tab for problem based charting.  Patient discussed with Dr. Evette Doffing

## 2019-07-07 NOTE — Assessment & Plan Note (Signed)
He has uncomplicated shingles along the T2 left upper extremity dermatome without facial involvement. I explained that his symptoms may get worse before they improve as he has pain in areas where eruption has not yet occurred. Discussed that blisters will continue to develop and drain and that he should keep the area clean and dry and that he will be contagious until scabs develop. Discussed post-herpatic neuralgia as well and that he will need the shingles vaccine as his rash can occur again in the future.   - continue acyclovir - 7 days total  - f/u in five days  - codeine - tylenol prn pain - OTC topical lidocaine

## 2019-07-07 NOTE — Telephone Encounter (Signed)
Pt seen in [place].Despina [place] Cass[place]7/24/202010:29 AM

## 2019-07-07 NOTE — Telephone Encounter (Signed)
Pt walks in clinic c/o pain please see 7/22 visit, will need interp.

## 2019-07-07 NOTE — Progress Notes (Signed)
Internal Medicine Clinic Attending  Case discussed with Dr. Seawell at the time of the visit.  We reviewed the resident's history and exam and pertinent patient test results.  I agree with the assessment, diagnosis, and plan of care documented in the resident's note.    

## 2019-07-07 NOTE — Patient Instructions (Signed)
Thank you for allowing [place] to provide your care today. Today we discussed Shingles rash.   Today we made the following changes to your medications:   Please START taking  Acetaminophen-codeine (tylenol #3) 300-30 mg tablet - take 1 tablet by mouth every 4 hours as needed for pain Please do not drive on this medication.   You also can pick up topical 4% lidocaine gel and apply to your rash as needed for pain.   Please STOP taking   Please follow-up in five days to determine if we need to extend therapy and to assess your rash.    Should you have any questions or concerns please call the internal medicine clinic at [phone].

## 2019-07-12 ENCOUNTER — Ambulatory Visit (INDEPENDENT_AMBULATORY_CARE_PROVIDER_SITE_OTHER): Payer: Self-pay | Admitting: Internal Medicine

## 2019-07-12 DIAGNOSIS — R0981 Nasal congestion: Secondary | ICD-10-CM

## 2019-07-12 DIAGNOSIS — Z79891 Long term (current) use of opiate analgesic: Secondary | ICD-10-CM

## 2019-07-12 DIAGNOSIS — R140 Abdominal distension (gaseous): Secondary | ICD-10-CM

## 2019-07-12 DIAGNOSIS — B029 Zoster without complications: Secondary | ICD-10-CM

## 2019-07-12 DIAGNOSIS — Z79899 Other long term (current) drug therapy: Secondary | ICD-10-CM

## 2019-07-12 MED ORDER — GABAPENTIN 300 MG PO CAPS: 300.0000 mg | ORAL_CAPSULE | Freq: Three times a day (TID) | ORAL | 2 refills | Status: DC

## 2019-07-12 MED ORDER — ACETAMINOPHEN-CODEINE #3 300-30 MG PO TABS: 1.0000 | ORAL_TABLET | ORAL | 0 refills | Status: DC | PRN

## 2019-07-12 MED ORDER — ACETAMINOPHEN-CODEINE #3 300-30 MG PO TABS: 1.0000 | ORAL_TABLET | ORAL | 0 refills | Status: AC | PRN

## 2019-07-12 NOTE — Assessment & Plan Note (Signed)
Patient complaining of abdominal bloating and "gas build up". Patient has previously been evaluated and is taking pantoprazole 40mg  bid. He reports daily bowel movements without change color or hematochezia. Patient is unable to associate bloating/abdominal discomfort with food intake at this time. Patient was advised to consider starting Gas-X over the counter for this.   - Continue protonix 40mg  bid - Start simethicone  - Continue to monitor

## 2019-07-12 NOTE — Addendum Note (Signed)
Addended by: Aldine Contes on: 07/12/2019 03:21 PM   Modules accepted: Level of Service

## 2019-07-12 NOTE — Progress Notes (Signed)
   CC: shingles rash follow up   HPI:  [name] is a 58 y.o. male with PMHx as listed below presenting to clinic today for follow up on his shingles rash for which he was seen on 7/24. Telephone translator was used during this visit. Patient reports that his rash has improved since last visit however he continues to have pain in the distribution of the rash. Patient denies any fever, pruritis, or drainage from the lesions.   Past Medical History:  Diagnosis Date  . Anemia   . CHF (congestive heart failure) ([place])   . GERD (gastroesophageal reflux disease)   . Hypertension   . Thalassemia, alpha ([place])    Review of Systems:  Review of Systems  Constitutional: Negative for chills and fever.  HENT: Positive for congestion. Negative for sinus pain and sore throat.   Respiratory: Negative for cough, shortness of breath and wheezing.   Cardiovascular: Negative for chest pain and palpitations.  Gastrointestinal: Positive for abdominal pain. Negative for blood in stool, constipation, diarrhea, melena, nausea and vomiting.  Musculoskeletal: Positive for back pain. Negative for joint pain, myalgias and neck pain.  Skin: Positive for rash.  Neurological: Negative for dizziness, sensory change, focal weakness and headaches.  Endo/Heme/Allergies: Does not bruise/bleed easily.     Physical Exam:  Vitals:   07/12/19 0936  BP: 130/78  Pulse: 80  Temp: 98 F (36.7 C)  TempSrc: Oral  SpO2: 100%  Weight: 128 lb 11.2 oz (58.4 kg)  Height: 5\' 2"  (1.575 m)   Physical Exam  Constitutional: He is oriented to person, place, and time and well-developed, well-nourished, and in no distress.  HENT:  Head: Normocephalic and atraumatic.  Cardiovascular: Normal rate, regular rhythm, normal heart sounds and intact distal pulses. Exam reveals no gallop and no friction rub.  No murmur heard. Pulmonary/Chest: Effort normal and breath sounds normal. No respiratory distress. He has no wheezes. He has no  rales.  Musculoskeletal: Normal range of motion.        General: No tenderness.  Neurological: He is alert and oriented to person, place, and time. No cranial nerve deficit.  Skin: Skin is warm and dry. Rash noted. There is erythema.  Erythema with evenly distributed crusted lesions along the T2 dermatome and inner arm extending to elbow.  No active drainage noted.      Assessment & Plan:   See Encounters Tab for problem based charting.  Patient seen with Dr. Dareen Piano

## 2019-07-12 NOTE — Progress Notes (Signed)
Internal Medicine Clinic Attending  I saw and evaluated the patient.  I personally confirmed the key portions of the history and exam documented by Dr. Aslam and I reviewed pertinent patient test results.  The assessment, diagnosis, and plan were formulated together and I agree with the documentation in the resident's note.     

## 2019-07-12 NOTE — Patient Instructions (Addendum)
Eric Ingram,   It was a pleasure seeing you in clinic today. Today we discussed your shingles rash.  It seems like your rash is improving from last time we saw you in the clinic. Please continue to keep it clean. For the pain, we are going to continue your Tylenol and also add on Gabapentin.  This should help with the pain; However, it is common for the pain to last for 4-6 weeks even after the rash has healed.   For your abdominal bloating, please consider starting Gas-X.   Please contact us with any concerns.  Thank you!

## 2019-07-12 NOTE — Assessment & Plan Note (Signed)
Patient is following up for an uncomplicated shingles rash along the T2 dermatome and left upper extremity. Patient's blisters have drained and scabbed over. No blisters or lesions noted today. Patient continues to experience pain in the distribution of the rash. Discussed that pain may be present for several weeks even after resolution of rash. Patient reports good pain control with acetaminophen-codeine and denies any fever or chills.  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 Patient instructed to follow up if pain worsens.  - Gabapentin 300mg  tid - Continue acetaminophen-codein 300-30mg  q4h prn

## 2019-07-18 ENCOUNTER — Ambulatory Visit (HOSPITAL_COMMUNITY)
Admission: RE | Admit: 2019-07-18 | Discharge: 2019-07-18 | Disposition: A | Discharge status: Home / Self Care | Payer: Self-pay | Source: Ambulatory Visit | Attending: Internal Medicine | Admitting: Internal Medicine

## 2019-07-18 ENCOUNTER — Ambulatory Visit (INDEPENDENT_AMBULATORY_CARE_PROVIDER_SITE_OTHER): Payer: Self-pay | Admitting: Internal Medicine

## 2019-07-18 ENCOUNTER — Other Ambulatory Visit: Payer: Self-pay

## 2019-07-18 ENCOUNTER — Encounter: Payer: Self-pay | Admitting: Internal Medicine

## 2019-07-18 VITALS — BP 135/84 | HR 89 | Temp 98.3°F | Ht 62.0 in | Wt 131.4 lb

## 2019-07-18 DIAGNOSIS — B0229 Other postherpetic nervous system involvement: Secondary | ICD-10-CM

## 2019-07-18 DIAGNOSIS — B029 Zoster without complications: Secondary | ICD-10-CM

## 2019-07-18 DIAGNOSIS — K219 Gastro-esophageal reflux disease without esophagitis: Secondary | ICD-10-CM

## 2019-07-18 DIAGNOSIS — K5900 Constipation, unspecified: Secondary | ICD-10-CM

## 2019-07-18 DIAGNOSIS — G8929 Other chronic pain: Secondary | ICD-10-CM

## 2019-07-18 DIAGNOSIS — R109 Unspecified abdominal pain: Secondary | ICD-10-CM

## 2019-07-18 DIAGNOSIS — M25512 Pain in left shoulder: Secondary | ICD-10-CM | POA: Insufficient documentation

## 2019-07-18 DIAGNOSIS — Z79899 Other long term (current) drug therapy: Secondary | ICD-10-CM

## 2019-07-18 IMAGING — CR LEFT SHOULDER - 2+ VIEW
3 series · 3 of 3 positions shown · non-contrast
Comparison: None.

CLINICAL DATA: Left shoulder pain.

EXAM:
LEFT SHOULDER - 2+ VIEW

[shoulder grashey]
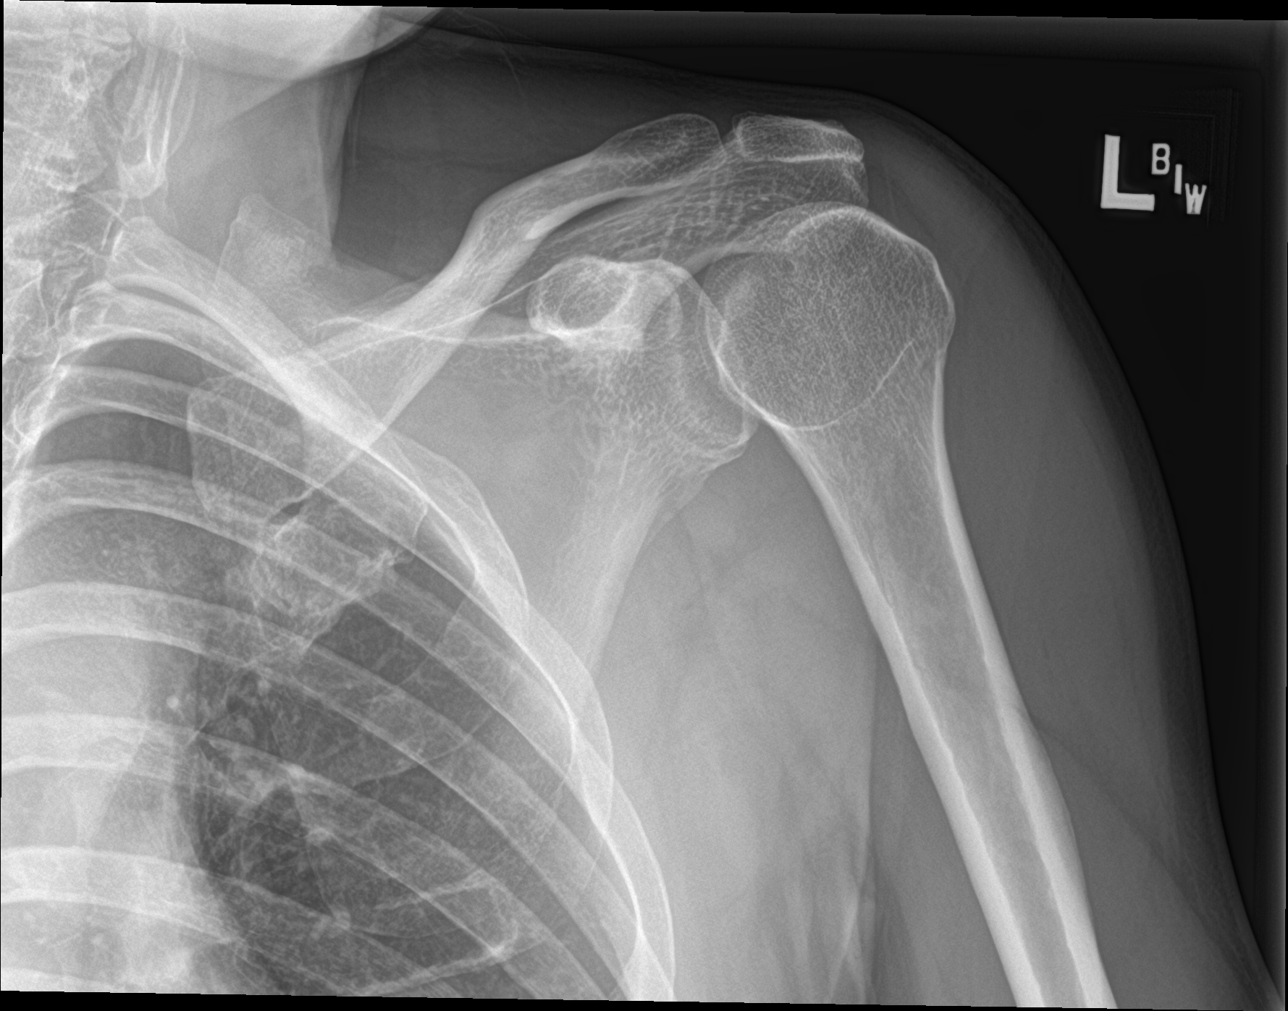

[shoulder y view]
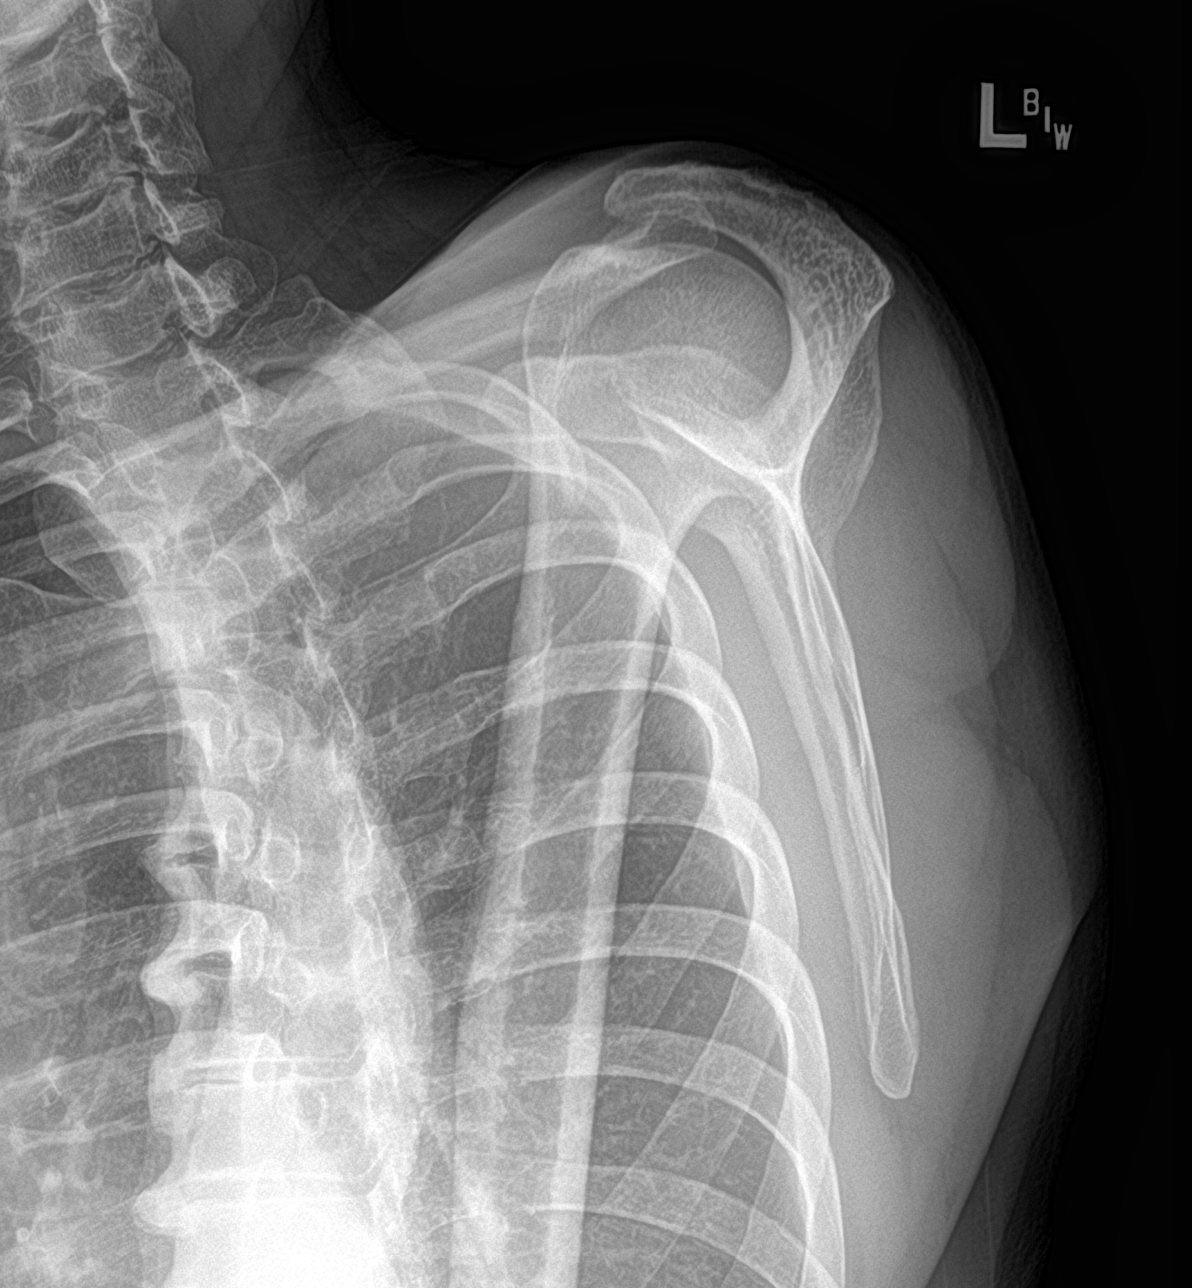

[shoulder axillary]
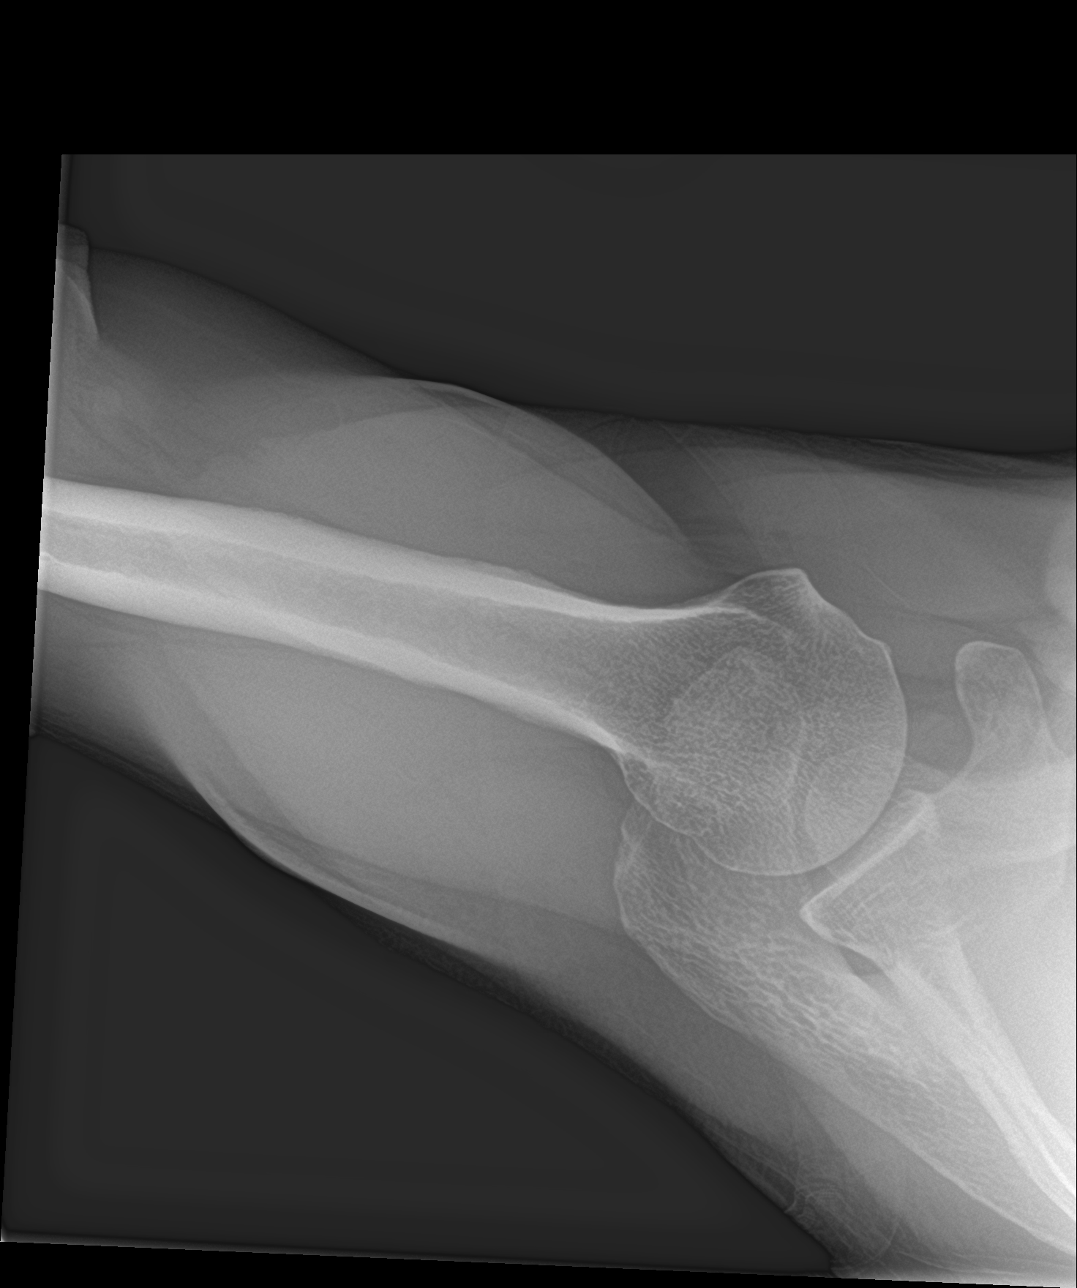

[3 of 3 positions shown; findings below may reference images not displayed]

FINDINGS: There is no evidence of fracture or dislocation. There is no
evidence of arthropathy or other focal bone abnormality. Soft
tissues are unremarkable.
IMPRESSION: Negative.

## 2019-07-18 MED ORDER — POLYETHYLENE GLYCOL 3350 17 G PO PACK: 17.0000 g | PACK | Freq: Every day | ORAL | 2 refills | Status: DC

## 2019-07-18 MED ORDER — GABAPENTIN 300 MG PO CAPS: 300.0000 mg | ORAL_CAPSULE | Freq: Four times a day (QID) | ORAL | 2 refills | Status: DC

## 2019-07-18 NOTE — Assessment & Plan Note (Addendum)
Continue to have pain, tingling and numbness in the T2 distribution. Majority of lesions have resolved.  Plan: - Continue taking Acetaminophen-Codeine 300-30mg  as prescribed. Instructed the patient to call the clinic a day prior to needing a refill for this medication.  - Increase Gabapentin to 300 mg 4 times daily. A single 300 mg capsule in the morning and midday. Then two 300 mg capsules at night (600 mg total at night). Eric Ingram was instructed to call the clinic if this regimen does not work so we can increase the dose.

## 2019-07-18 NOTE — Patient Instructions (Addendum)
It was a pleasure meeting you today. Here is a summary of our visit:  1. Shingles neuralgia - I am changing you Gabapentin prescription to 300 mg 4 times a day. You should take 1 capsule (300 mg total) in the morning and midday. Take 2 capsules (600 mg total) at night. If this does not help after a day please call the office and tell them to send me a message to increase your dose.   2. GERD ("Reflux") - continue taking the pantapropazole 40 mg each day.   3. Constipation - continue taking the colace 50 mg each day. Start taking Miralax 1 cap full dissolved in a beverage of your choice. Start drinking more water each day. About 64 once each day.   4. Left Shoulder Pain - I have order an xray for your left shoulder. We will follow up with these result in a week.   Please make a follow up appointment in 1 week so we can talk about your xray and reassess your pain.    Best regards,  Marianna Payment, D.O.

## 2019-07-18 NOTE — Assessment & Plan Note (Addendum)
Patient admits to having frequent, small volume, hard stools since starting his opioid medication. Continues to have abdominal discomfort and bloating. He was previously prescribed colace 50 mg daily without relief.   Plan: - Continue taking colace as prescribed. Add Miralax 1 cap dissolved in liquid daily. - Counseled the patient on increasing fiber in diet and the importance of staying hydrated.

## 2019-07-18 NOTE — Assessment & Plan Note (Addendum)
Admits to chronic shoulder pain for over a year. Never been imaged before. He is neurovascularly intact with decreased abduction at 60 degrees. 5/5 strength in the upper extremities bilaterally.Positive Apley scratch test, Negative infraspinatus/teres minor exam.  Plan: - XR of the left shoulder - Patient may benefit form PT referral.

## 2019-07-18 NOTE — Assessment & Plan Note (Signed)
Patient states that he has substernal chest pain after eating food. He was recently prescribed pantapropazole 40 mg daily on 07/09/202. He continues to have chest discomfort.   Plan:  - Continue taking PPI as prescribed.  - Patient was counseled about using extra pillows at night, not eating food 2 hours before going to bed and decreasing spice foods and alcohol.

## 2019-07-19 NOTE — Progress Notes (Signed)
   CC: Shingles Neuritis   HPI:  [name] is a 58 y.o. with a past medical history noted below and presented today for management of his shingles neuritis. Please see problem based assessment and plan for more details.   Past Medical History:  Diagnosis Date  . Anemia   . CHF (congestive heart failure) ([place])   . GERD (gastroesophageal reflux disease)   . Hypertension   . Thalassemia, alpha ([place])    Review of Systems:  Review of Systems  Constitutional: Negative for chills and fever.  Respiratory: Negative for cough and shortness of breath.   Cardiovascular: Negative for chest pain.  Gastrointestinal: Positive for abdominal pain, constipation and heartburn. Negative for blood in stool, nausea and vomiting.  Musculoskeletal: Positive for joint pain (left shoulder).  Neurological: Positive for tingling and sensory change.       Pain, numbness and tingling along the T2 distributuion     Physical Exam:  Vitals:   07/18/19 0841  BP: 135/84  Pulse: 89  Temp: 98.3 F (36.8 C)  TempSrc: Oral  SpO2: 99%  Weight: 131 lb 6.4 oz (59.6 kg)  Height: 5\' 2"  (1.575 m)   Physical Exam  Constitutional: He is oriented to person, place, and time and well-developed, well-nourished, and in no distress.  Cardiovascular: Normal rate, regular rhythm, normal heart sounds and intact distal pulses.  Pulmonary/Chest: Effort normal and breath sounds normal.  Abdominal: Soft. He exhibits no distension. There is abdominal tenderness.  Musculoskeletal:     Left shoulder: He exhibits decreased range of motion (abduction to 60 degress) and tenderness (with motion). He exhibits no bony tenderness and no effusion.  Neurological: He is alert and oriented to person, place, and time. He has normal sensation, normal strength and normal reflexes.  Skin: Skin is warm and dry. Lesion (a single healing lesions with crust in on the chest within the T2 dermatome likely from previous  shingles  episode) noted.     Assessment & Plan:   See Encounters Tab for problem based charting.  Patient seen with Dr. Lynnae January

## 2019-07-21 NOTE — Progress Notes (Signed)
Internal Medicine Clinic Attending  I saw and evaluated the patient.  I personally confirmed the key portions of the history and exam documented by Dr. Coe and I reviewed pertinent patient test results.  The assessment, diagnosis, and plan were formulated together and I agree with the documentation in the resident's note.    

## 2019-07-25 ENCOUNTER — Other Ambulatory Visit: Payer: Self-pay

## 2019-07-25 ENCOUNTER — Telehealth: Payer: Self-pay | Admitting: Internal Medicine

## 2019-07-25 ENCOUNTER — Ambulatory Visit (INDEPENDENT_AMBULATORY_CARE_PROVIDER_SITE_OTHER): Payer: Self-pay | Admitting: Internal Medicine

## 2019-07-25 DIAGNOSIS — M25512 Pain in left shoulder: Secondary | ICD-10-CM

## 2019-07-25 DIAGNOSIS — B0229 Other postherpetic nervous system involvement: Secondary | ICD-10-CM

## 2019-07-25 DIAGNOSIS — Z79899 Other long term (current) drug therapy: Secondary | ICD-10-CM

## 2019-07-25 DIAGNOSIS — B029 Zoster without complications: Secondary | ICD-10-CM

## 2019-07-25 MED ORDER — ACETAMINOPHEN-CODEINE #3 300-30 MG PO TABS: 1.0000 | ORAL_TABLET | ORAL | 0 refills | Status: DC | PRN

## 2019-07-25 MED ORDER — GABAPENTIN 300 MG PO CAPS: 300.0000 mg | ORAL_CAPSULE | Freq: Four times a day (QID) | ORAL | 2 refills | Status: DC

## 2019-07-25 MED ORDER — ACETAMINOPHEN-CODEINE #3 300-30 MG PO TABS: 1.0000 | ORAL_TABLET | ORAL | 0 refills | Status: AC | PRN

## 2019-07-25 NOTE — Telephone Encounter (Signed)
Pls call Outpatient pharmacy, pt is there waiting [phone]

## 2019-07-25 NOTE — Patient Instructions (Addendum)
Eric Ingram, It was nice meeting you! Today we discussed your Shingles pain. This can take some time, but should slowly get better over the next few weeks. I have refilled your Tylenol prescription.  I would also like you to increase the Gabapentin to 2 pills at your evening dose. I this does not help you can add another pill to your morning dose and afternoon dose for 6 pills/day total.   We will bring you back in 2 weeks to see how you are doing.   Take care, Dr. Koleen Distance

## 2019-07-25 NOTE — Telephone Encounter (Signed)
Eric Ingram, [facility] - need clarification of Gabapentin; received rx with 2 different instructions and a different instruction on pt's AVS. And pt has been waiting.  Talked to Dr Koleen Distance - received verbal order Gabapentin 300 mg Take 1 in am, I in afternoon and 2 at night.  Called Eric Ingram back - verbal given for Gabapentin "Take 1 in am, I in afternoon and 2 at night." per Dr Koleen Distance. Also with qty # 120 (instead of #60) x 2 RF - is this ok?

## 2019-07-26 NOTE — Progress Notes (Signed)
   CC: shingles, left shoulder pain   HPI:  Mr.Desmin Sandefur is a 58 y.o. gentleman with PMHx listed below who presents for follow-up on shingles and left shoulder pain. Please see problem based charting for further details.   Past Medical History:  Diagnosis Date  . Anemia   . CHF (congestive heart failure) ([place])   . GERD (gastroesophageal reflux disease)   . Hypertension   . Thalassemia, alpha ([place])    Review of Systems:   Constitutional: negative for fevers, chills Cardio: negative for chest pain, palpitations Resp: negative for cough, shortness of breath GI: negative for nausea, vomiting   Physical Exam:  Vitals:   07/25/19 0842  BP: 134/82  Pulse: 71  Temp: 98.1 F (36.7 C)  TempSrc: Oral  SpO2: 99%  Weight: 134 lb (60.8 kg)   General: alert, pleasant, well-appearing, NAD CV: RRR; no m/r/g Pulm: normal work of breathing; lungs CTAB MSK: left shoulder with decreased ROM due to pain  Neuro: A&Ox3; dysesthesia of T2 dermatome where previous lesion was, as well as in LUE  Skin: no visible lesions   Assessment & Plan:   See Encounters Tab for problem based charting.  Patient discussed with Dr. Lynnae January

## 2019-07-27 ENCOUNTER — Encounter: Payer: Self-pay | Admitting: Internal Medicine

## 2019-07-27 NOTE — Assessment & Plan Note (Addendum)
Unchanged since last visit.  He seems fixated that most of his pain is from nerve pain related to shingles flare. Plain films were unremarkable. Would recommend re-assessing once postherpetic neuralgia resolves. Etiology may be rotator cuff tendinopathy vs cervical spine pathology.  Likely would benefit from PT referral vs injection.

## 2019-07-27 NOTE — Assessment & Plan Note (Signed)
Patient presents with postherpetic neuralgia from recent shingles flare. He endorses some improvement with Tylenol 3 and Gabapentin. No new active lesions seen today. Previous note instructed him to take Gabapentin 1200 mg daily, but patient states he has only been taking Gabapentin 300 mg TID. He was instructed to increase to 2 tabs at night and can self-titrate with an additional 300 mg in am and afternoon if needed.  Tylenol 3 refilled today. Will follow-up in 2 weeks to assess symptom control. Reiterated that this pain can take several weeks to fully resolve.

## 2019-07-28 NOTE — Progress Notes (Signed)
Internal Medicine Clinic Attending  Case discussed with Dr. Bloomfield at the time of the visit.  We reviewed the resident's history and exam and pertinent patient test results.  I agree with the assessment, diagnosis, and plan of care documented in the resident's note.  

## 2019-08-08 ENCOUNTER — Ambulatory Visit (INDEPENDENT_AMBULATORY_CARE_PROVIDER_SITE_OTHER): Payer: Self-pay | Admitting: Internal Medicine

## 2019-08-08 ENCOUNTER — Encounter: Payer: Self-pay | Admitting: Internal Medicine

## 2019-08-08 ENCOUNTER — Other Ambulatory Visit: Payer: Self-pay

## 2019-08-08 DIAGNOSIS — B0223 Postherpetic polyneuropathy: Secondary | ICD-10-CM

## 2019-08-08 DIAGNOSIS — Z79899 Other long term (current) drug therapy: Secondary | ICD-10-CM

## 2019-08-08 DIAGNOSIS — B0229 Other postherpetic nervous system involvement: Secondary | ICD-10-CM

## 2019-08-08 MED ORDER — LIDOCAINE 5 % EX PTCH: 1.0000 | MEDICATED_PATCH | Freq: Two times a day (BID) | CUTANEOUS | 3 refills | Status: DC

## 2019-08-08 NOTE — Patient Instructions (Addendum)
Mr. Outing, It was good seeing you again.  I am sorry you continue to have nerve pain from your shingles. Continue taking the Gabapentin medication as prescribed.  I'd also like you to try topical Lidocaine patches. I am sending in a prescription, but they can also be purchased over-the-counter if it is too expensive.   Remember, this can unfortunately take time to heal.    Please return in 2-3 weeks to see how you are doing.   Take care, Dr. Koleen Distance

## 2019-08-08 NOTE — Progress Notes (Signed)
   CC: postherpetic neuralgia   HPI:  Eric Ingram is a 58 y.o. gentleman with recent herpes zoster infection presents for follow-up on postherpetic neuralgia. Please see problem based   Past Medical History:  Diagnosis Date  . Anemia   . CHF (congestive heart failure) ([place])   . GERD (gastroesophageal reflux disease)   . Hypertension   . Thalassemia, alpha ([place])    Review of Systems:   Constitutional: negative for fevers, chills Skin: negative for new rash GI: negative for n/v   Physical Exam:  Vitals:   08/08/19 0853  BP: (!) 144/94  Pulse: 77  Temp: 98.4 F (36.9 C)  TempSrc: Oral  SpO2: 100%  Weight: 134 lb 8 oz (61 kg)  Height: 5\' 2"  (1.575 m)   General: alert, well-appearing, NAD CV: RRR; no m/r/g Pulm: normal work of breathing; lungs CTAB Skin: hypersensitivity along C5 dermatome. No vesicular lesions noted.   Assessment & Plan:   See Encounters Tab for problem based charting.  Patient discussed with Dr. Evette Doffing

## 2019-08-09 ENCOUNTER — Encounter: Payer: Self-pay | Admitting: Internal Medicine

## 2019-08-09 NOTE — Progress Notes (Signed)
Internal Medicine Clinic Attending  Case discussed with Dr. Bloomfield at the time of the visit.  We reviewed the resident's history and exam and pertinent patient test results.  I agree with the assessment, diagnosis, and plan of care documented in the resident's note.  

## 2019-08-09 NOTE — Assessment & Plan Note (Signed)
Patient with recent herpes zoster infection on [id number] now with complication of postherpetic neuralgia. He continues to have hypersensitivity and dysesthesia along C5 dermatome. Rash has cleared. He is on 1200 mg of Gabapentin with mild relief. Will add topical lidocaine to assist with pain control and avoid systemic side effects. Continued to educate that this can unfortunately be a prolonged course.

## 2019-08-29 ENCOUNTER — Ambulatory Visit (INDEPENDENT_AMBULATORY_CARE_PROVIDER_SITE_OTHER): Payer: Self-pay | Admitting: Internal Medicine

## 2019-08-29 ENCOUNTER — Ambulatory Visit (HOSPITAL_COMMUNITY)
Admission: RE | Admit: 2019-08-29 | Discharge: 2019-08-29 | Disposition: A | Discharge status: Home / Self Care | Payer: Self-pay | Source: Ambulatory Visit | Attending: Internal Medicine | Admitting: Internal Medicine

## 2019-08-29 ENCOUNTER — Encounter: Payer: Self-pay | Admitting: Internal Medicine

## 2019-08-29 VITALS — BP 158/86 | HR 74 | Temp 98.3°F | Wt 135.8 lb

## 2019-08-29 DIAGNOSIS — I10 Essential (primary) hypertension: Secondary | ICD-10-CM

## 2019-08-29 DIAGNOSIS — Z79899 Other long term (current) drug therapy: Secondary | ICD-10-CM

## 2019-08-29 DIAGNOSIS — M79632 Pain in left forearm: Secondary | ICD-10-CM

## 2019-08-29 DIAGNOSIS — Z9181 History of falling: Secondary | ICD-10-CM

## 2019-08-29 DIAGNOSIS — M7502 Adhesive capsulitis of left shoulder: Secondary | ICD-10-CM | POA: Insufficient documentation

## 2019-08-29 DIAGNOSIS — M778 Other enthesopathies, not elsewhere classified: Secondary | ICD-10-CM

## 2019-08-29 DIAGNOSIS — D649 Anemia, unspecified: Secondary | ICD-10-CM

## 2019-08-29 DIAGNOSIS — D560 Alpha thalassemia: Secondary | ICD-10-CM

## 2019-08-29 DIAGNOSIS — I110 Hypertensive heart disease with heart failure: Secondary | ICD-10-CM

## 2019-08-29 DIAGNOSIS — R079 Chest pain, unspecified: Secondary | ICD-10-CM | POA: Insufficient documentation

## 2019-08-29 DIAGNOSIS — M7501 Adhesive capsulitis of right shoulder: Secondary | ICD-10-CM | POA: Insufficient documentation

## 2019-08-29 DIAGNOSIS — R9431 Abnormal electrocardiogram [ECG] [EKG]: Secondary | ICD-10-CM | POA: Insufficient documentation

## 2019-08-29 DIAGNOSIS — G8929 Other chronic pain: Secondary | ICD-10-CM

## 2019-08-29 DIAGNOSIS — M7592 Shoulder lesion, unspecified, left shoulder: Secondary | ICD-10-CM

## 2019-08-29 DIAGNOSIS — I5030 Unspecified diastolic (congestive) heart failure: Secondary | ICD-10-CM

## 2019-08-29 DIAGNOSIS — K219 Gastro-esophageal reflux disease without esophagitis: Secondary | ICD-10-CM

## 2019-08-29 DIAGNOSIS — B0229 Other postherpetic nervous system involvement: Secondary | ICD-10-CM

## 2019-08-29 NOTE — Assessment & Plan Note (Signed)
Elevated BP today. He is currently taking Lisinopril 40mg . Will need re-assessment at his next PCP appointment

## 2019-08-29 NOTE — Assessment & Plan Note (Signed)
Due to complaint of chest pain today, EKG was obtained to rule out ACS prior to examination. 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 Q waves were not present on EKG obtained in January of 2020, and T wave has flipped in precordial leads. Patient denies any chest pain in the past 9 months besides the chest pain associated with his shingles. He does have a history of uncontrolled HTN and Stage 1 diastolic CHF. For further evaluation, referred to cardiology at this time.   Plan:  - Cardiology referral

## 2019-08-29 NOTE — Assessment & Plan Note (Signed)
History of fall in 2018 with chronic left shoulder pain since then. On examination, pain on passive and active ROM with significantly decreased ROM overall supports a diagnosis of frozen shoulder. Discussed with patient to slowly start some exercises for this. Follow up in 2 weeks and if no improvement with exercises, may be a good candidate for steroid injection.   Plan:  - Follow up in 2 weeks - Daily shoulder exercises

## 2019-08-29 NOTE — Assessment & Plan Note (Signed)
Initial shingles rash occurred in July 2020 and on examination today, no evidence of recurrence. However, [name] continues to have burning, numbness and pain in the areas where his rashes were. Gabapentin appears to be effective, however patient ran out due to financial strain. Will work to make sure he can pick it up today.   Plan:  - Continue Gabapentin 300 mg QID

## 2019-08-29 NOTE — Progress Notes (Signed)
CC: Left arm, shoulder and chest pain  HPI:  Mr.Maverick Detloff is a 58 y.o. male with a with a past medical history of CHF, anemia, GERD, hypertension, alpha thalassemia who presents to the clinic for evaluation of left arm, shoulder and chest pain.  Mr. Sherwood Gambler reports that he has been having left forearm, upper arm, shoulder pain with radiation to his left and central chest ever since he had his shingles infection back in July 2020.  He describes his chest pain as chronic but with slight worsening.  It feels like mucus accumulation.  He reports gabapentin alleviates the symptoms but he has run out of the medication and cannot not afford it currently.  When the pain is very severe, it leads to dizziness and neck pain. Left arm pain also occurs with some numbness in the area of former shingles rash.  He endorses shortness of breath with fatigue but denies shortness of breath at baseline or with exertion.  None of his symptoms are unchanged from his initial shingles outbreak, but have persisted.  Mr. Sherwood Gambler also endorses left shoulder pain that has caused decreased range of motion since a fall back in 2018.  Has caused him to not be able to have a job.  He tries not to move his left shoulder too much due to pain.  His left shoulder pain was exacerbated by shingles outbreak but was present prior.  Past Medical History:  Diagnosis Date  . Anemia   . CHF (congestive heart failure) ([place])   . GERD (gastroesophageal reflux disease)   . Hypertension   . Thalassemia, alpha ([place])    Review of Systems:  Review of Systems  Constitutional: Negative for chills, fever and malaise/fatigue.  HENT: Positive for sore throat.   Respiratory: Positive for shortness of breath. Negative for cough.   Cardiovascular: Positive for chest pain. Negative for palpitations and leg swelling.  Gastrointestinal: Negative for abdominal pain, diarrhea, nausea and vomiting.  Genitourinary: Negative for dysuria, frequency, hematuria  and urgency.  Musculoskeletal: Positive for joint pain and myalgias.  Skin: Negative for itching and rash.  Neurological: Positive for dizziness, sensory change and headaches. Negative for focal weakness.   Physical Exam:  Vitals:   08/29/19 0922  BP: (!) 158/86  Pulse: 74  Temp: 98.3 F (36.8 C)  TempSrc: Oral  SpO2: 100%  Weight: 135 lb 12.8 oz (61.6 kg)   Physical Exam Constitutional:      General: He is not in acute distress.    Appearance: He is normal weight. He is not toxic-appearing.  Neck:     Musculoskeletal: Normal range of motion and neck supple.  Cardiovascular:     Rate and Rhythm: Normal rate and regular rhythm.     Heart sounds: Normal heart sounds. Heart sounds not distant. No murmur.  Pulmonary:     Effort: Pulmonary effort is normal. No respiratory distress.     Breath sounds: Normal breath sounds. No decreased breath sounds, wheezing, rhonchi or rales.  Chest:     Chest wall: Tenderness (central tenderness to palpation) present. No mass or deformity.  Abdominal:     General: Bowel sounds are normal. There is no abdominal bruit.     Palpations: Abdomen is soft.     Tenderness: There is no guarding.  Musculoskeletal:     Right shoulder: Normal.     Left shoulder: He exhibits decreased range of motion (with active and passive motion. Unable to abduct further than a 90 degree angle. Unable  to place arm behind back. ), tenderness (worse along posterior and superior aspects) and swelling (along superior aspect of scapula). He exhibits no effusion, no crepitus, no deformity and no laceration.     Right lower leg: He exhibits no tenderness. No edema.     Left lower leg: He exhibits no tenderness. No edema.  Skin:    General: Skin is warm and dry.     Findings: No rash. Rash is not crusting, papular, pustular, scaling or vesicular.  Neurological:     General: No focal deficit present.     Mental Status: He is alert and oriented to person, place, and time.   Psychiatric:        Mood and Affect: Mood normal.        Behavior: Behavior normal.    Assessment & Plan:   See Encounters Tab for problem based charting.  Patient seen with Dr. Philipp Ovens

## 2019-08-29 NOTE — Patient Instructions (Signed)
It was nice seeing you today! Thank you for choosing [facility] for your Primary Care.    Today we talked about:   1. Continue Gabapentin for your shingles pain.  2. I printed out a sheet with stretches for your shoulder pain. Work on the stretches slowly.   3. A referral was made to Cardiology for EKG changes. You will be called to make the appointment.

## 2019-08-30 NOTE — Congregational Nurse Program (Signed)
CN office visit with interpreter Diu Hartshorn assisting.  Patient saw PCP yesterday regarding persistent pain in left shoulder and arm apparently due due a recent bout of Shingles.  He also has Frozen Shoulder and was given a list of exercises for it.  Patient not clear on how to do exercizes so we explained and demonstrated each one and advised him of frequency and number of repetitions.  He voiced understanding and agreed to do them until MD follow-up appointment in 2 weeks.  Jake Michaelis RN, Congregational Nurse [phone]

## 2019-08-31 NOTE — Progress Notes (Signed)
Internal Medicine Clinic Attending  I saw and evaluated the patient.  I personally confirmed the key portions of the history and exam documented by Dr. Basaraba and I reviewed pertinent patient test results.  The assessment, diagnosis, and plan were formulated together and I agree with the documentation in the resident's note.    

## 2019-09-12 ENCOUNTER — Ambulatory Visit (INDEPENDENT_AMBULATORY_CARE_PROVIDER_SITE_OTHER): Payer: Self-pay | Admitting: Internal Medicine

## 2019-09-12 ENCOUNTER — Other Ambulatory Visit: Payer: Self-pay

## 2019-09-12 ENCOUNTER — Ambulatory Visit (HOSPITAL_COMMUNITY)
Admission: RE | Admit: 2019-09-12 | Discharge: 2019-09-12 | Disposition: A | Discharge status: Home / Self Care | Payer: Self-pay | Source: Ambulatory Visit | Attending: Internal Medicine | Admitting: Internal Medicine

## 2019-09-12 VITALS — BP 133/98 | HR 88 | Temp 97.8°F | Ht 62.0 in | Wt 138.0 lb

## 2019-09-12 DIAGNOSIS — M5412 Radiculopathy, cervical region: Secondary | ICD-10-CM

## 2019-09-12 DIAGNOSIS — Z87891 Personal history of nicotine dependence: Secondary | ICD-10-CM

## 2019-09-12 DIAGNOSIS — M7502 Adhesive capsulitis of left shoulder: Secondary | ICD-10-CM

## 2019-09-12 DIAGNOSIS — B0229 Other postherpetic nervous system involvement: Secondary | ICD-10-CM

## 2019-09-12 DIAGNOSIS — Z79899 Other long term (current) drug therapy: Secondary | ICD-10-CM

## 2019-09-12 DIAGNOSIS — M778 Other enthesopathies, not elsewhere classified: Secondary | ICD-10-CM

## 2019-09-12 IMAGING — DX DG CERVICAL SPINE COMPLETE 4+V
6 series · 6 of 6 positions shown · non-contrast
Comparison: None.

CLINICAL DATA: Chronic left neck pain and cervical radiculopathy.

EXAM:
CERVICAL SPINE - COMPLETE 4+ VIEW

[c-spine lat]
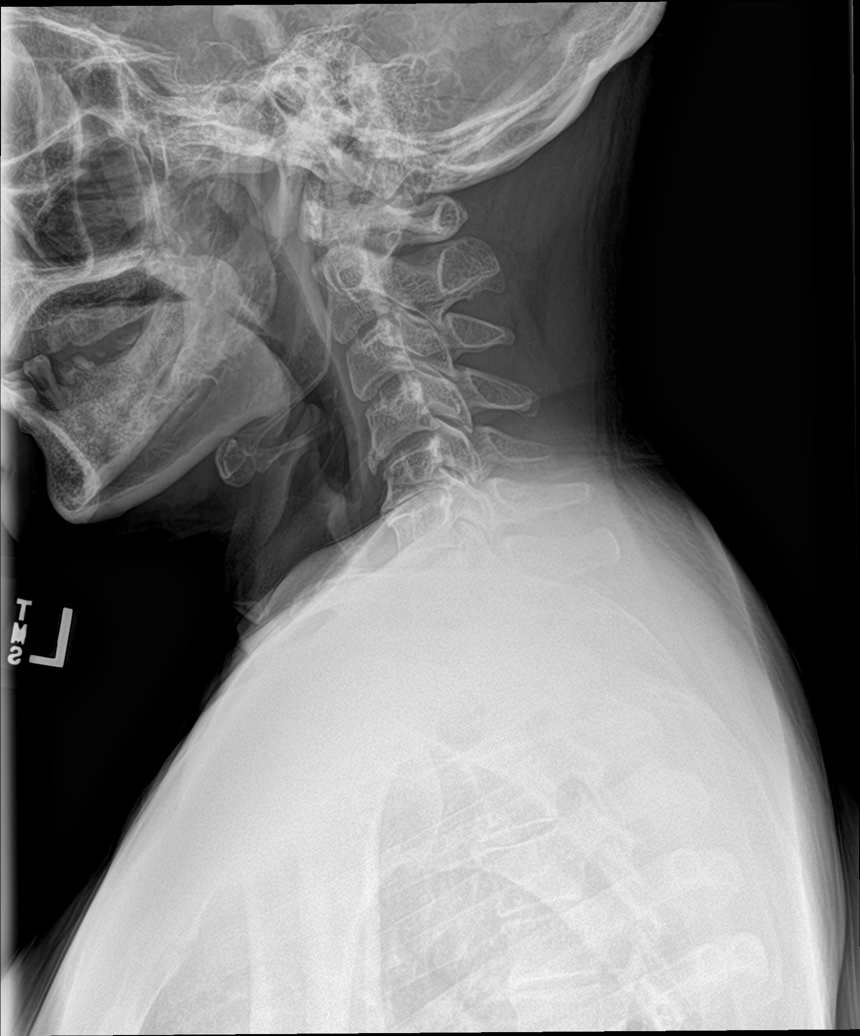

[c-spine obl (1 of 2)]
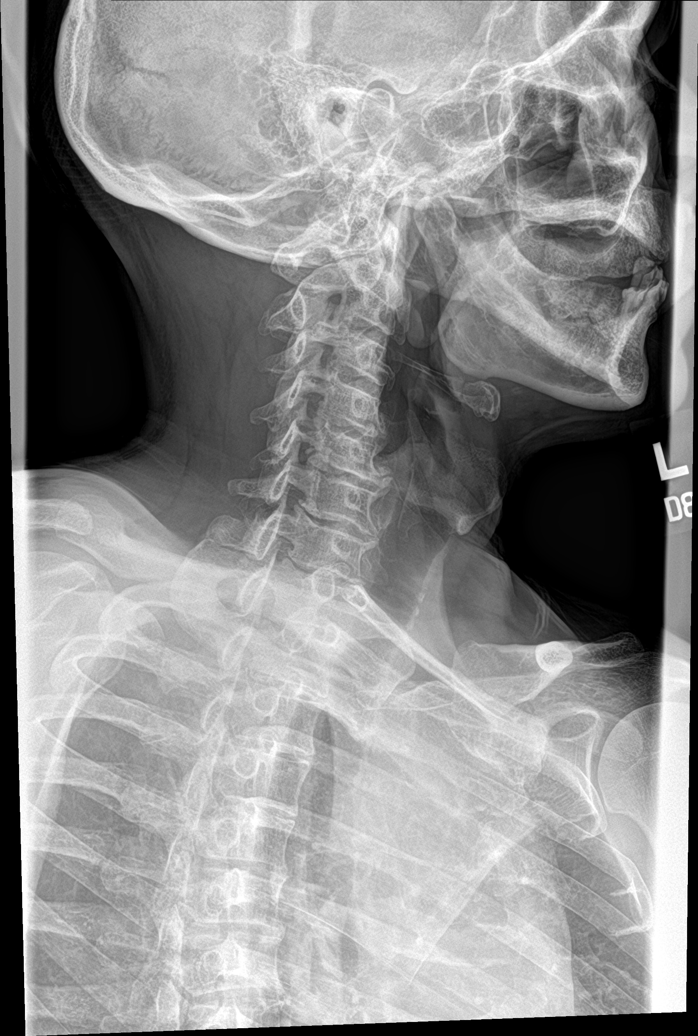

[c-spine obl (2 of 2)]
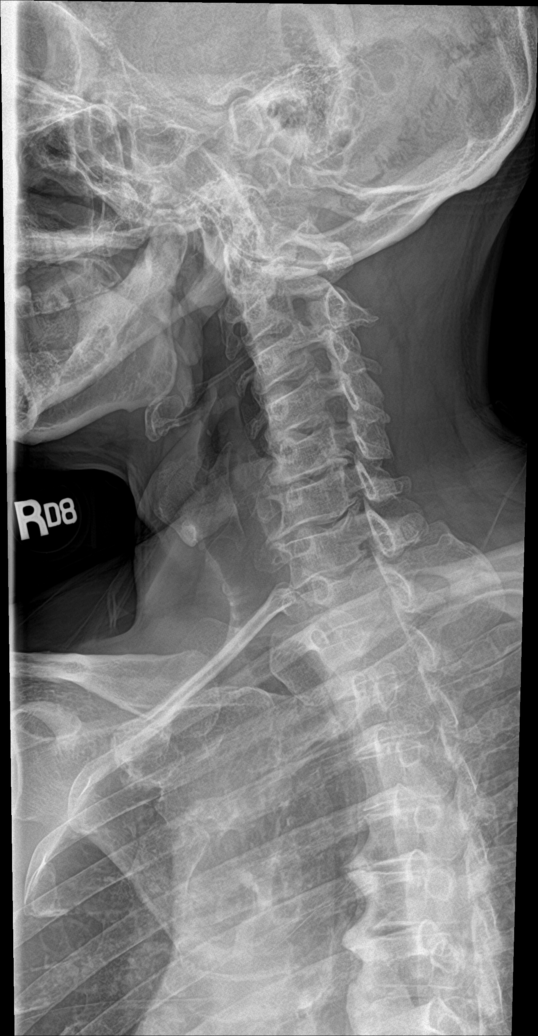

[c-spine ap]
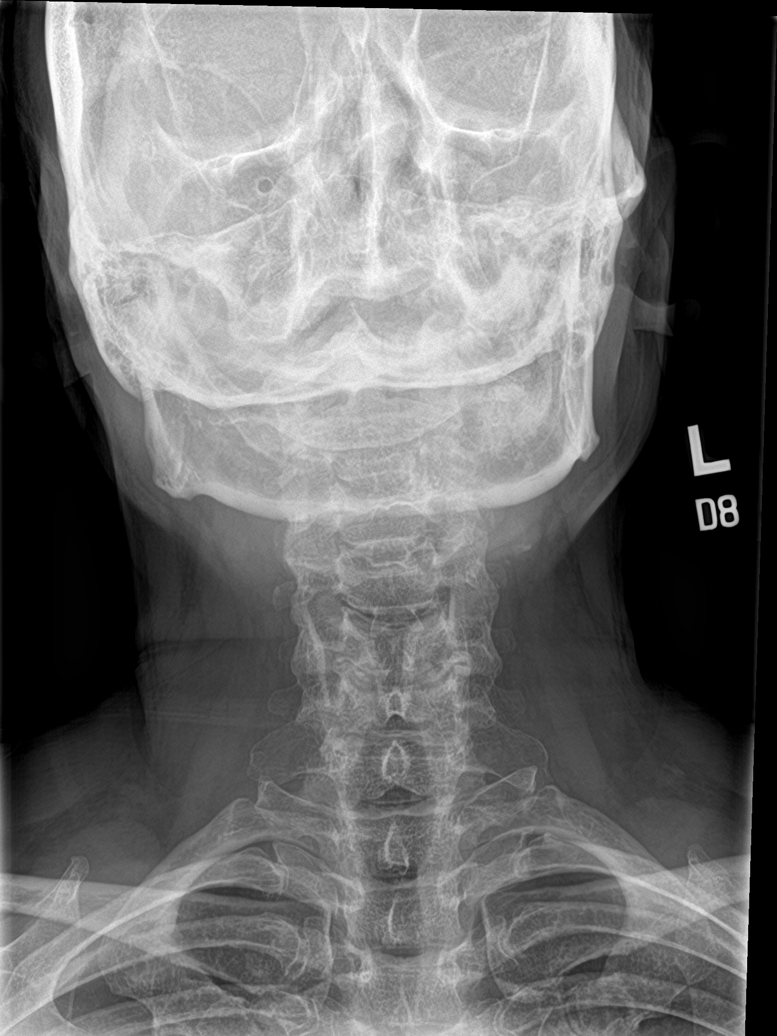

[c-spine open mouth]
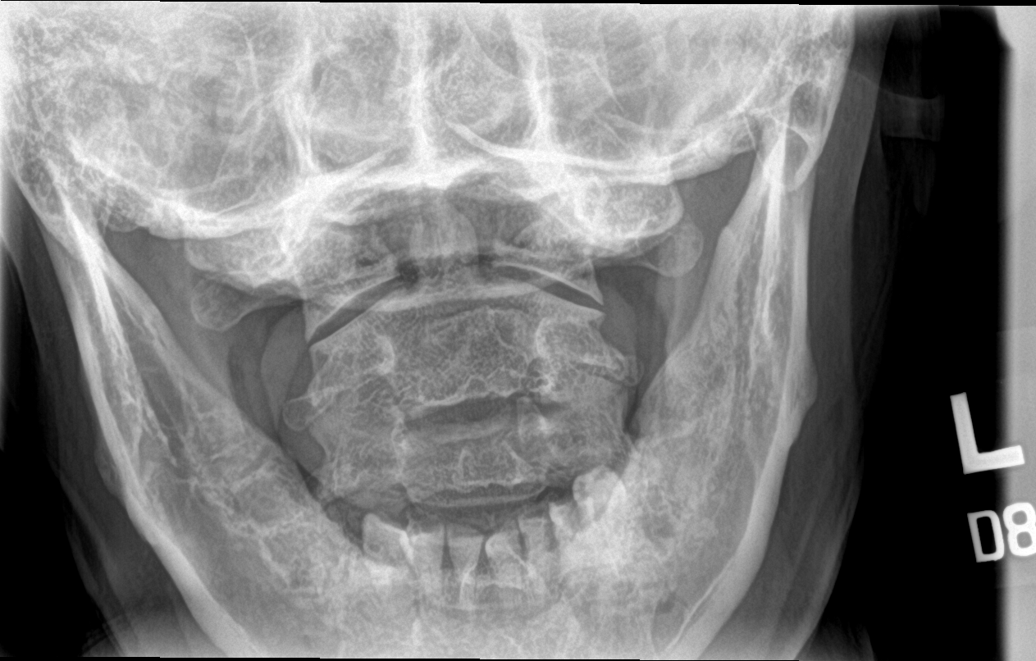

[c-spine swimmers]
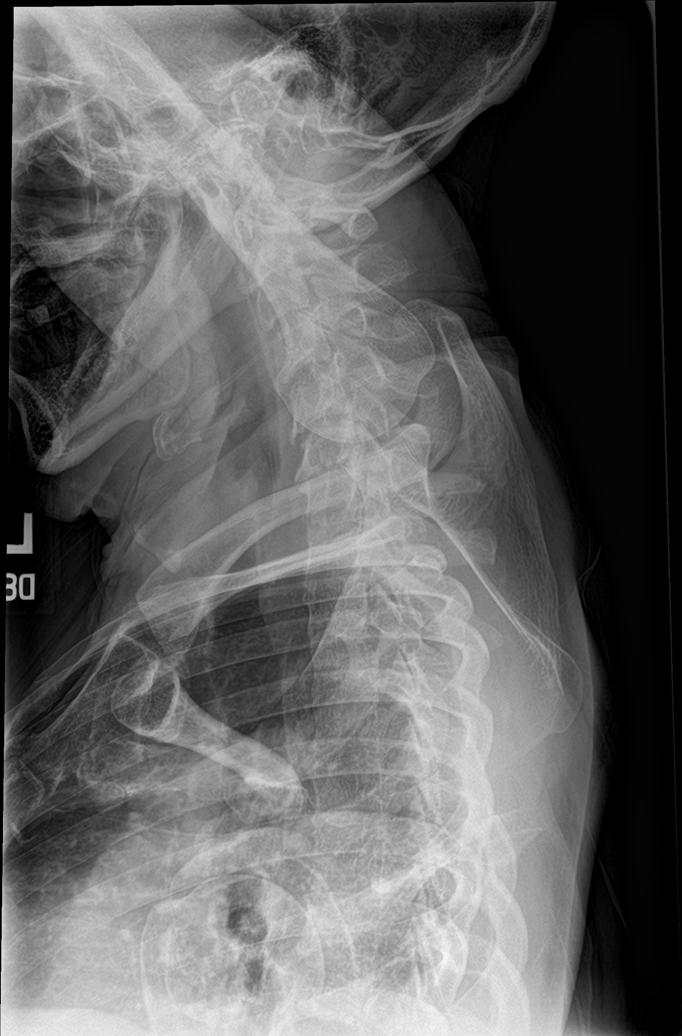

[6 of 6 positions shown; findings below may reference images not displayed]

FINDINGS: No fracture or spondylolisthesis is noted. Moderate degenerative
disc disease is noted at C5-6, C6-7 and C7-T1 with mild-to-moderate
bilateral neural foraminal stenosis at these levels secondary to
uncovertebral spurring. No prevertebral soft tissue swelling is
noted.
IMPRESSION: Moderate multilevel degenerative disc disease with bilateral neural
foraminal stenosis secondary to uncovertebral spurring. No acute
abnormality seen in the cervical spine.

## 2019-09-12 MED ORDER — NAPROXEN 500 MG PO TABS: 500.0000 mg | ORAL_TABLET | Freq: Two times a day (BID) | ORAL | 2 refills | Status: DC | PRN

## 2019-09-12 NOTE — Assessment & Plan Note (Signed)
Continues having symptoms, tolerating gabapentin well.   -he was instructed he can increase gabapentin to two 300mg  pills

## 2019-09-12 NOTE — Progress Notes (Signed)
Internal Medicine Clinic Attending  Case discussed with Dr. Winfrey  at the time of the visit.  We reviewed the resident's history and exam and pertinent patient test results.  I agree with the assessment, diagnosis, and plan of care documented in the resident's note.  

## 2019-09-12 NOTE — Assessment & Plan Note (Signed)
Shoulder exercises helped a lot but still having some pain.    -will add naproxen PRN he can use to help complete his exercises

## 2019-09-12 NOTE — Progress Notes (Signed)
CC: adhesive capsulitis, post herpetic neuropathy, cervical radiculopathy  HPI:  [name] is a 58 y.o. male with PMH below.  Today we will address adhesive capsulitis, post herpetic neuropathy, cervical radiculopathy  Shoulder exercises helped a lot   Please see A&P for status of the patient's chronic medical conditions  Past Medical History:  Diagnosis Date  . Anemia   . CHF (congestive heart failure) ([employer])   . Dental infection 08/02/2018  . GERD (gastroesophageal reflux disease)   . Hypertension   . Shingles rash 07/07/2019  . Thalassemia, alpha ([place])   . Viral gastritis 02/08/2019   Review of Systems:   ROS: Pulmonary: pt denies increased work of breathing, shortness of breath,  Cardiac: pt denies palpitations  Abdominal: pt denies abdominal pain, nausea, vomiting, or diarrhea   Physical Exam:  Vitals:   09/12/19 0921 09/12/19 0926  BP: (!) 148/84 (!) 133/98  Pulse: 91 88  Temp: 97.8 F (36.6 C)   TempSrc: Oral   SpO2: 100%   Weight: 138 lb (62.6 kg)   Height: 5\' 2"  (1.575 m)    Cardiac: JVD flat, normal rate and rhythm, clear s1 and s2, no murmurs, rubs or gallops, no LE edema Pulmonary: CTAB, not in distress Abdominal: non distended abdomen, soft and nontender Psych: Alert, conversant, in good spirit MSK: improved rang of motion of left shoulder, negative empty can, no point tenderness Neuro: some numbness of left upper extremity at the medial forearm, left arm significantly weaker than right.  Left lower extremity hyperreflexic and weaker compared with RLE would not cooperate with upper extremity relfex exam  Social History   Socioeconomic History  . Marital status: Married    Spouse name: Not on file  . Number of children: 4  . Years of education: Not on file  . Highest education level: Not on file  Occupational History  . Occupation: Unemployed  Social Needs  . Financial resource strain: Not on file  . Food insecurity    Worry: Not on file    Inability: Not on file  . Transportation needs    Medical: Not on file    Non-medical: Not on file  Tobacco Use  . Smoking status: Former Smoker    Quit date: 12/15/1999    Years since quitting: 19.7  . Smokeless tobacco: Never Used  Substance and Sexual Activity  . Alcohol use: No    Frequency: Never    Comment: former  . Drug use: No  . Sexual activity: Not on file  Lifestyle  . Physical activity    Days per week: Not on file    Minutes per session: Not on file  . Stress: Not on file  Relationships  . Social Herbalist on phone: Not on file    Gets together: Not on file    Attends religious service: Not on file    Active member of club or organization: Not on file    Attends meetings of clubs or organizations: Not on file    Relationship status: Not on file  . Intimate partner violence    Fear of current or ex partner: Not on file    Emotionally abused: Not on file    Physically abused: Not on file    Forced sexual activity: Not on file  Other Topics Concern  . Not on file  Social History Narrative  . Not on file    Family History  Problem Relation Age of Onset  . Colon  cancer Neg Hx   . Stomach cancer Neg Hx     Assessment & Plan:   See Encounters Tab for problem based charting.  Patient discussed with Dr. Lynnae January

## 2019-09-12 NOTE — Patient Instructions (Signed)
Mr. Oneal we will get an x-ray of your neck today and start you on naproxen for your shoulder.  Please return for follow up in one month to see how you're doing.

## 2019-09-12 NOTE — Assessment & Plan Note (Signed)
Pain in his neck that radiates down left arm.  Also  weakness of left upper extremity compared to right.   -cervical x-ray -continue gabapentin

## 2019-10-02 ENCOUNTER — Encounter: Payer: Self-pay | Admitting: Internal Medicine

## 2019-10-10 ENCOUNTER — Ambulatory Visit (INDEPENDENT_AMBULATORY_CARE_PROVIDER_SITE_OTHER): Payer: Self-pay | Admitting: Internal Medicine

## 2019-10-10 ENCOUNTER — Other Ambulatory Visit: Payer: Self-pay

## 2019-10-10 ENCOUNTER — Encounter: Payer: Self-pay | Admitting: Internal Medicine

## 2019-10-10 VITALS — BP 154/95 | HR 93 | Temp 98.3°F | Ht 62.0 in | Wt 137.3 lb

## 2019-10-10 DIAGNOSIS — M5412 Radiculopathy, cervical region: Secondary | ICD-10-CM

## 2019-10-10 DIAGNOSIS — B0229 Other postherpetic nervous system involvement: Secondary | ICD-10-CM

## 2019-10-10 DIAGNOSIS — J069 Acute upper respiratory infection, unspecified: Secondary | ICD-10-CM

## 2019-10-10 DIAGNOSIS — M778 Other enthesopathies, not elsewhere classified: Secondary | ICD-10-CM

## 2019-10-10 DIAGNOSIS — M7502 Adhesive capsulitis of left shoulder: Secondary | ICD-10-CM

## 2019-10-10 DIAGNOSIS — Z79899 Other long term (current) drug therapy: Secondary | ICD-10-CM

## 2019-10-10 DIAGNOSIS — M5010 Cervical disc disorder with radiculopathy, unspecified cervical region: Secondary | ICD-10-CM

## 2019-10-10 DIAGNOSIS — K5900 Constipation, unspecified: Secondary | ICD-10-CM

## 2019-10-10 DIAGNOSIS — M4802 Spinal stenosis, cervical region: Secondary | ICD-10-CM

## 2019-10-10 MED ORDER — GABAPENTIN 300 MG PO CAPS: 600.0000 mg | ORAL_CAPSULE | Freq: Three times a day (TID) | ORAL | 2 refills | Status: DC

## 2019-10-10 NOTE — Assessment & Plan Note (Signed)
Reports continued intermittent constipation with good control on Miralax. He has BM every day to every other day. He was told if he goes without a BM he can take additional dose of Miralax if needed. He denies pain, abdominal distention, nausea. - Miralax Daily and PRN

## 2019-10-10 NOTE — Assessment & Plan Note (Addendum)
Patient presenting for follow up of cervical radiculopathy. He continues to have pain in his neck and shoulder that is a little worse than his last visit. He did not increase his gabapentin dose after is last visit, so I changed his prescription to 600mg  TID. He continues to have some left arm weakness on exam, but negative Spurling's. Given he has a degree of capsulitis and he has not improved thus far, we will refer to sports medicine for further evaluation - X-ray showed DDD w/ foraminal stenosis - Gabapentin 600mg  TID - Sports medicine referral

## 2019-10-10 NOTE — Patient Instructions (Addendum)
Thank you for allowing [place] to care for you  For your neck pain and left arm symptoms - X-ray show disc disease in your neck and narrowing of canals that nerves pass through - Continue gabapentin, increase to 2 tablet three times per day  For your Shoulder pain and stiffness - Continue exercises and naproxen  For your nerve pain following shingles - Continue Gabapentin at increased dose  For you congestion - This is likely due to a viral URI - Quarantine at home for about 1 more week - Use nasal rinses and Mucinex to help with your congestion  You can take extra dose of Miralax for constipation if needed  Follow up in about 1 week for Memory evaluation   Follow up with PCP in 1-2 months

## 2019-10-10 NOTE — Assessment & Plan Note (Addendum)
Patient presenting for follow up. He was told he could increase his gabapentin to 600mg  (two 300mg  tablets) at last visit, but he has not done so. I have increased his dose to 600mg  TID - Gabapentin 600mg  TID

## 2019-10-10 NOTE — Progress Notes (Signed)
   CC: Cervical radiculopathy, Shoulder Capsulitis, Postherpetic neuralgia, Constipation, Viral URI  HPI:  [name] is a 58 y.o. M with PMHx listed below presenting for Cervical radiculopathy, Shoulder Capsulitis, Postherpetic neuralgia, Preventative Health Care Cervical radiculopathy, Shoulder Capsulitis, Postherpetic neuralgia, Constipation, Viral URI. Please see the A&P for the status of the patient's chronic medical problems.  Past Medical History:  Diagnosis Date  . Anemia   . CHF (congestive heart failure) ([place])   . Dental infection 08/02/2018  . GERD (gastroesophageal reflux disease)   . Hypertension   . Shingles rash 07/07/2019  . Thalassemia, alpha ([place])   . Viral gastritis 02/08/2019   Review of Systems:  Performed and all others negative.  Physical Exam:  Vitals:   10/10/19 0922  BP: (!) 154/95  Pulse: 93  Temp: 98.3 F (36.8 C)  TempSrc: Oral  SpO2: 100%  Weight: 137 lb 4.8 oz (62.3 kg)  Height: 5\' 2"  (1.575 m)   Physical Exam Constitutional:      General: He is not in acute distress.    Appearance: Normal appearance.  Cardiovascular:     Rate and Rhythm: Normal rate and regular rhythm.     Pulses: Normal pulses.     Heart sounds: Normal heart sounds.  Pulmonary:     Effort: Pulmonary effort is normal. No respiratory distress.     Breath sounds: Normal breath sounds.  Abdominal:     General: Bowel sounds are normal. There is no distension.     Palpations: Abdomen is soft.     Tenderness: There is no abdominal tenderness.  Musculoskeletal:        General: No swelling or deformity.     Comments: Decreased strength (4/5) of LUE Mildly decreased range of motion  Skin:    General: Skin is warm and dry.  Neurological:     General: No focal deficit present.     Mental Status: Mental status is at baseline.    Assessment & Plan:   See Encounters Tab for problem based charting.  Patient discussed with Dr. Lynnae January

## 2019-10-10 NOTE — Assessment & Plan Note (Signed)
Patient report about 1 week of increased congestion, drainage and intermittent cough. He denies fever or chills. He likely has a viral URI. We discussed the need to quarantine for an additional 3-7 days given on going COVID-19 pandemic.  - Nasal Rinse before bed - Mucinex OTC - Other OTC cough.cold medicine okay

## 2019-10-10 NOTE — Assessment & Plan Note (Addendum)
Patient here for follow up today. At last visit, he was started on Naproxen PRN to allow him to better complete shoulder exercises that had been helping his symptoms. He has continued with exercises and naproxen has helped some but he continues to have shoulder pain. We refer to sports medicine for closer evaluation. - Continue Shoulder Exercises - ContinuePRN Naproxen - Sports medicine referral

## 2019-10-11 NOTE — Progress Notes (Signed)
Internal Medicine Clinic Attending  Case discussed with Dr. Melvin  at the time of the visit.  We reviewed the resident's history and exam and pertinent patient test results.  I agree with the assessment, diagnosis, and plan of care documented in the resident's note.  

## 2019-10-13 ENCOUNTER — Encounter: Payer: Self-pay | Admitting: Family Medicine

## 2019-10-13 ENCOUNTER — Other Ambulatory Visit: Payer: Self-pay

## 2019-10-13 ENCOUNTER — Ambulatory Visit: Payer: Self-pay | Admitting: Cardiology

## 2019-10-13 ENCOUNTER — Ambulatory Visit (INDEPENDENT_AMBULATORY_CARE_PROVIDER_SITE_OTHER): Payer: Self-pay | Admitting: Family Medicine

## 2019-10-13 VITALS — BP 134/82 | Ht 62.0 in | Wt 135.0 lb

## 2019-10-13 DIAGNOSIS — M25512 Pain in left shoulder: Secondary | ICD-10-CM

## 2019-10-13 DIAGNOSIS — M25511 Pain in right shoulder: Secondary | ICD-10-CM

## 2019-10-13 DIAGNOSIS — M7502 Adhesive capsulitis of left shoulder: Secondary | ICD-10-CM

## 2019-10-13 DIAGNOSIS — M7501 Adhesive capsulitis of right shoulder: Secondary | ICD-10-CM

## 2019-10-13 DIAGNOSIS — M778 Other enthesopathies, not elsewhere classified: Secondary | ICD-10-CM

## 2019-10-13 MED ORDER — METHYLPREDNISOLONE ACETATE 40 MG/ML IJ SUSP
40.0000 mg | Freq: Once | INTRAMUSCULAR | Status: AC
  Administered 2019-10-13: 40 mg via INTRA_ARTICULAR

## 2019-10-13 MED ORDER — METHYLPREDNISOLONE ACETATE 40 MG/ML IJ SUSP
40.0000 mg | Freq: Once | INTRAMUSCULAR | Status: AC
  Administered 2019-10-13: 11:00:00 40 mg via INTRA_ARTICULAR

## 2019-10-13 NOTE — Assessment & Plan Note (Signed)
I think his posterior shoulder pain in his neck pain is coming from abnormal musculoskeletal function secondary to bilateral adhesive capsulitis which is certainly worse on the left.  Had a lengthy discussion with him using his interpreter to explain natural progression of this.  I recommend serial glenohumeral joint injections with aggressive home PT plan.  He is also asked me to set up formal physical therapy.

## 2019-10-13 NOTE — Patient Instructions (Signed)
I think your main issue is bilateral frozen shoulder.  Today we did bilateral glenohumeral joint injections under ultrasound.  The current preferred treatment for adhesive capsulitis or frozen shoulder is serial injections accompanied by aggressive physical therapy.  It will be up to you to be very diligent with your daily exercises.  Additionally I am going to send you to physical therapy but we have also given you a Thera-Band and some home exercises.  Please call us with any new or worsening symptoms.  I would like to see you back in 4 to 5 weeks.  It was great to meet you!

## 2019-10-13 NOTE — Progress Notes (Signed)
Eric Ingram - 58 y.o. male MRN [id number]  Date of birth: 11/27/1961    SUBJECTIVE:      Chief Complaint:/ HPI:  In person interpreter used for the entire visit including informed consent and procedure. Referred for combination of bilateral shoulder pain and stiffness with left upper back and neck pain.  He also has a history of post herpetic neuralgia. He is right-hand dominant.  Not currently working and has not done a lot of work for the last 2 years secondary to his shoulder issues and neck issues.  The left posterior shoulder is where it usually bothers him first, then it radiates into his neck.  Occasionally it causes headache.  Headache is located in the posterior portion not associated with any other unusual symptoms such as visual changes or balance problems.   Occasionally he has some mild left forearm numbness mostly on the ulnar side.  Cannot tell if it is associated with the back and neck issues or not.  No tingling into the hand.  He does occasionally has some dizziness but is not associated specifically with headaches.   ROS:     No cough, no shortness of breath.  PERTINENT  PMH / PSH FH / / SH:  Past Medical, Surgical, Social, and Family History Reviewed & Updated in the EMR.  Pertinent findings include:  History of zoster  OBJECTIVE: BP 134/82   Ht 5\' 2"  (1.575 m)   Wt 135 lb (61.2 kg)   BMI 24.69 kg/m   Physical Exam:  Vital signs are reviewed. Well-developed male no acute distress Shoulders: Symmetrical.  Very limited range of motion.  Left shoulder abduction to 90 degrees only.  Forward flexion to 100 degrees.  He cannot place his hand even at his posterior hip in the plane of internal rotation.  Right shoulder abduction to 110, forward flexion 125.  He can get his right hand to the vicinity of his right hip on internal rotation playing. Elbows wrists fingers all have normal range of motion. VASCULAR: Radial pulses 2+ bilateral or symmetrical NEURO: Intact  sensation to soft touch bilateral hands.  Area he points to on his left forearm for numbness seems to have intact sensation. NECK: Full range of motion for flexion, extension, lateral rotation and side bending.  Nontender to palpation.  Negative Spurling's.   PROCEDURE: INJECTION: Patient was given informed consent, signed copy in the chart. Appropriate time out was taken. Area prepped and draped in usual sterile fashion. Ethyl chloride was  used for local anesthesia. A 21 gauge 1 1/2 inch needle was used.. 1 cc of methylprednisolone 40 mg/ml plus 5 cc of 1% lidocaine without epinephrine was injected into the bilateral glenohumeral joints using a(n) ultrasound-guided posterior approach.  Dr. Glynn Ingram the injections and I supervised.  The patient tolerated the procedure well. There were no complications. Post procedure instructions were given.  ASSESSMENT & PLAN:  See problem based charting & AVS for pt instructions. Adhesive capsulitis of both shoulders I think his posterior shoulder pain in his neck pain is coming from abnormal musculoskeletal function secondary to bilateral adhesive capsulitis which is certainly worse on the left.  Had a lengthy discussion with him using his interpreter to explain natural progression of this.  I recommend serial glenohumeral joint injections with aggressive home PT plan.  He is also asked me to set up formal physical therapy.  #2.  He has some mild degenerative changes in his neck but I do not think this is a true  cervical radiculopathy.  I reviewed his C-spine films and also reviewed CT scan that he had for other reasons.  The foramen are actually pretty open.  I think the numbness he has in his left forearm may be a result of his postherpetic neuralgia. 3.  He started asked me questions about dizziness.  I told him he would need to speak with his primary care provider about that and urged him to get an appointment.  I did agree to send his PCP a note so  that he would be aware of this issue next time he sees him.  I will see him back in 4 to 5 weeks.  Home exercise program given and we have referred him to physical therapy.

## 2019-10-18 ENCOUNTER — Telehealth: Payer: Self-pay

## 2019-10-18 NOTE — Telephone Encounter (Signed)
I Left a voice mail message for [place] that this patient needs to see Cardiology on [address]. The number to call is [phone].The patient had an appointment for 10-13-2019 but NO showed [place], [place] C11/4/20209:49 AM

## 2019-10-19 ENCOUNTER — Ambulatory Visit (INDEPENDENT_AMBULATORY_CARE_PROVIDER_SITE_OTHER): Payer: Self-pay | Admitting: Cardiology

## 2019-10-19 ENCOUNTER — Other Ambulatory Visit: Payer: Self-pay

## 2019-10-19 VITALS — BP 154/96 | HR 80 | Ht 62.0 in | Wt 138.0 lb

## 2019-10-19 DIAGNOSIS — R072 Precordial pain: Secondary | ICD-10-CM

## 2019-10-19 DIAGNOSIS — I10 Essential (primary) hypertension: Secondary | ICD-10-CM

## 2019-10-19 MED ORDER — METOPROLOL TARTRATE 100 MG PO TABS: ORAL_TABLET | ORAL | 0 refills | Status: DC

## 2019-10-19 NOTE — Progress Notes (Signed)
Cardiology Office Note:    Date:  10/19/2019   ID:  Eric Ingram, DOB October 26, 1961, MRN [id number]  PCP:  Earlene Plater, MD  Cardiologist:  No primary care provider on file.  Electrophysiologist:  None   Referring MD: Velna Ochs, MD   Chief Complaint  Patient presents with  . Chest Pain   History of Present Illness:    Eric Ingram is a 58 y.o. male with a hx of hypertension, alpha thalassemia who is referred by Dr. Philipp Ovens for evaluation of EKG abnormalities and chest pain.  EKG demonstrated inferior Q waves, as he was referred to cardiology for further evaluation.  He does report chest pain, occurring every other day or so.  Describes pain at center of chest, which she describes as burning pain.  Reports that has noted some association with exertion, particularly when he is lifting something.  He says when he walks will get pain on left upper adbomen/ lower chest, which he describes is different than the pain he gets in the center of his chest.  Also reports some shortness of breath.  He is on lisinopril 40 mg daily for hypertension, which he did not take this morning did not take BP meds this morning.  Quit smoking 20 years ago.  Does not know if family history of heart disease.    He had an echocardiogram in 02/09/2018 which showed normal LV systolic function, grade 1 diastolic dysfunction, normal RV function, and no significant valvular disease.  Past Medical History:  Diagnosis Date  . Anemia   . CHF (congestive heart failure) ([place])   . Dental infection 08/02/2018  . GERD (gastroesophageal reflux disease)   . Hypertension   . Shingles rash 07/07/2019  . Thalassemia, alpha ([place])   . Viral gastritis 02/08/2019    Past Surgical History:  Procedure Laterality Date  . NO PAST SURGERIES      Current Medications: Current Meds  Medication Sig  . gabapentin (NEURONTIN) 300 MG capsule Take 2 capsules (600 mg total) by mouth 3 (three) times daily. Take up to 2 capsules in the  morning, afternoon and at bedtime.  . naproxen (NAPROSYN) 500 MG tablet Take 1 tablet (500 mg total) by mouth 2 (two) times daily as needed.  . pantoprazole (PROTONIX) 40 MG tablet Take 1 tablet (40 mg total) by mouth 2 (two) times daily.  . sucralfate (CARAFATE) 1 g tablet Take 1 tablet (1 g total) by mouth every 6 (six) hours as needed. Slowly dissolve 1 tablet in 1 tablespoon of water before taking     Allergies:   Patient has no known allergies.   Social History   Socioeconomic History  . Marital status: Married    Spouse name: Not on file  . Number of children: 4  . Years of education: Not on file  . Highest education level: Not on file  Occupational History  . Occupation: Unemployed  Social Needs  . Financial resource strain: Not on file  . Food insecurity    Worry: Not on file    Inability: Not on file  . Transportation needs    Medical: Not on file    Non-medical: Not on file  Tobacco Use  . Smoking status: Former Smoker    Quit date: 12/15/1999    Years since quitting: 19.8  . Smokeless tobacco: Never Used  Substance and Sexual Activity  . Alcohol use: No    Frequency: Never    Comment: former  . Drug use: No  .  Sexual activity: Not on file  Lifestyle  . Physical activity    Days per week: Not on file    Minutes per session: Not on file  . Stress: Not on file  Relationships  . Social Herbalist on phone: Not on file    Gets together: Not on file    Attends religious service: Not on file    Active member of club or organization: Not on file    Attends meetings of clubs or organizations: Not on file    Relationship status: Not on file  Other Topics Concern  . Not on file  Social History Narrative  . Not on file     Family History: The patient's family history is negative for Colon cancer and Stomach cancer.  ROS:   Please see the history of present illness.     All other systems reviewed and are negative.  EKGs/Labs/Other Studies Reviewed:     The following studies were reviewed today:   EKG:  EKG is  ordered today.  The ekg ordered today demonstrates normal sinus rhythm, rate 80, no ST/T abnormalities, Q waves in inferior leads not meeting pathologic criteria  Recent Labs: 02/08/2019: ALT 17; BUN 13; Creatinine, Ser 1.24; Hemoglobin 11.1; Platelets 359; Potassium 3.6; Sodium 137  Recent Lipid Panel    Component Value Date/Time   CHOL 139 02/09/2018 0623   TRIG 119 02/09/2018 0623   HDL 36 (L) 02/09/2018 0623   CHOLHDL 3.9 02/09/2018 0623   VLDL 24 02/09/2018 0623   LDLCALC 79 02/09/2018 0623    Physical Exam:    VS:  BP (!) 154/96   Pulse 80   Ht 5\' 2"  (1.575 m)   Wt 138 lb (62.6 kg)   SpO2 99%   BMI 25.24 kg/m     Wt Readings from Last 3 Encounters:  10/19/19 138 lb (62.6 kg)  10/13/19 135 lb (61.2 kg)  10/10/19 137 lb 4.8 oz (62.3 kg)     GEN:  Well nourished, well developed in no acute distress HEENT: Normal NECK: No JVD LYMPHATICS: No lymphadenopathy CARDIAC: RRR, no murmurs, rubs, gallops RESPIRATORY:  Clear to auscultation without rales, wheezing or rhonchi  ABDOMEN: Soft, non-tender, non-distended MUSCULOSKELETAL:  No edema; you can copy paste that back back-and-forth at the HD aVF 21 2 deformity  SKIN: Warm and dry NEUROLOGIC:  Alert and oriented x 3 PSYCHIATRIC:  Normal affect   ASSESSMENT:    1. Essential hypertension   2. Precordial pain    PLAN:    In order of problems listed above:  Chest pain: Atypical in description but given age and risk factors (HTN, smoking history), would classify as intermediate risk for obstructive coronary disease and warrants further evaluation -Coronary CTA -TTE -No recent lipid panel, will check lipids when drawing BMET prior to coronary CTA  Hypertension: on lisinopril 40 mg daily.  Elevated in clinic today but did not take his medication this morning   RTC in 2 months   Medication Adjustments/Labs and Tests Ordered: Current medicines are  reviewed at length with the patient today.  Concerns regarding medicines are outlined above.  Orders Placed This Encounter  Procedures  . CT CORONARY MORPH W/CTA COR W/SCORE W/CA W/CM &/OR WO/CM  . CT CORONARY FRACTIONAL FLOW RESERVE DATA PREP  . CT CORONARY FRACTIONAL FLOW RESERVE FLUID ANALYSIS  . Basic metabolic panel  . Lipid Profile  . EKG 12-Lead  . ECHOCARDIOGRAM COMPLETE   Meds ordered this encounter  Medications  . DISCONTD: metoprolol tartrate (LOPRESSOR) 100 MG tablet    Sig: Take 100 mg 2 hours before Coronary CT    Dispense:  1 tablet    Refill:  0  . DISCONTD: metoprolol tartrate (LOPRESSOR) 100 MG tablet    Sig: Take 100 mg 2 hours before Coronary CT    Dispense:  1 tablet    Refill:  0  . metoprolol tartrate (LOPRESSOR) 100 MG tablet    Sig: Take 100 mg 2 hours before Coronary CT    Dispense:  1 tablet    Refill:  0    Patient Instructions  Medication Instructions:  Continue same medications   Lab Work: Bmet and Lipid panel to be done 1 week before Coronary CT   Lab order enclosed   Testing/Procedures:  Schedule Echo  Schedule Coronary CT     Scheduler will call back with appointment    Follow directions below  Follow-Up: At [facility], you and your health needs are our priority.  As part of our continuing mission to provide you with exceptional heart care, we have created designated Provider Care Teams.  These Care Teams include your primary Cardiologist (physician) and Advanced Practice Providers (APPs -  Physician Assistants and Nurse Practitioners) who all work together to provide you with the care you need, when you need it.  Your next appointment:  2 months   The format for your next appointment:  Office   Provider:  Dr.Jahmar Mckelvy      Your cardiac CT will be scheduled at one of the below locations:   [facility] [address] [place], [place] [address] [phone]  [place] [address] [place], [place] [address] [phone]  If scheduled at [facility], please arrive at the [facility] main entrance of [facility] 30-45 minutes prior to test start time. Proceed to the [facility] Radiology Department (first floor) to check-in and test prep.  If scheduled at [facility], please arrive 15 mins early for check-in and test prep.  Please follow these instructions carefully (unless otherwise directed):  Hold all erectile dysfunction medications at least 3 days (72 hrs) prior to test.  On the Night Before the Test: . Be sure to Drink plenty of water. . Do not consume any caffeinated/decaffeinated beverages or chocolate 12 hours prior to your test. . Do not take any antihistamines 12 hours prior to your test.  On the Day of the Test: . Drink plenty of water. Do not drink any water within one hour of the test. . Do not eat any food 4 hours prior to the test. . You may take your regular medications prior to the test.  . Take metoprolol 100 mg two hours prior to test.          After the Test: . Drink plenty of water. . After receiving IV contrast, you may experience a mild flushed feeling. This is normal. . On occasion, you may experience a mild rash up to 24 hours after the test. This is not dangerous. If this occurs, you can take Benadryl 25 mg and increase your fluid intake. . If you experience trouble breathing, this can be serious. If it is severe call 911 IMMEDIATELY. If it is mild, please call our office.   Once we have confirmed authorization from your insurance company, we will call you to set up a date and time for your  test.   For non-scheduling related questions, please contact the cardiac imaging nurse navigator should you have any questions/concerns: Marchia Bond, RN Navigator Cardiac Imaging [facility] [phone] Office         Signed, Donato Heinz, MD  10/19/2019 2:48 PM    [place]

## 2019-10-19 NOTE — Patient Instructions (Addendum)
Medication Instructions:  Continue same medications   Lab Work: Bmet and Lipid panel to be done 1 week before Coronary CT   Lab order enclosed   Testing/Procedures:  Schedule Echo  Schedule Coronary CT     Scheduler will call back with appointment    Follow directions below  Follow-Up: At [facility], you and your health needs are our priority.  As part of our continuing mission to provide you with exceptional heart care, we have created designated Provider Care Teams.  These Care Teams include your primary Cardiologist (physician) and Advanced Practice Providers (APPs -  Physician Assistants and Nurse Practitioners) who all work together to provide you with the care you need, when you need it.  Your next appointment:  2 months   The format for your next appointment:  Office   Provider:  Dr.Schumann      Your cardiac CT will be scheduled at one of the below locations:   [facility] [address] [place], [place] [address] [phone]  [place] [address] [place], [place] [address] [phone]  If scheduled at [facility], please arrive at the [facility] main entrance of [facility] 30-45 minutes prior to test start time. Proceed to the [facility] Radiology Department (first floor) to check-in and test prep.  If scheduled at [facility], please arrive 15 mins early for check-in and test prep.  Please follow these instructions carefully (unless otherwise directed):  Hold all erectile dysfunction medications at least 3 days (72 hrs) prior to test.  On the Night Before the Test: . Be sure to Drink plenty of water. . Do not consume any caffeinated/decaffeinated beverages or chocolate 12 hours prior to your test. . Do not take any antihistamines 12 hours prior to your test.  On the Day of the Test: . Drink plenty of water. Do not drink any water  within one hour of the test. . Do not eat any food 4 hours prior to the test. . You may take your regular medications prior to the test.  . Take metoprolol 100 mg two hours prior to test.          After the Test: . Drink plenty of water. . After receiving IV contrast, you may experience a mild flushed feeling. This is normal. . On occasion, you may experience a mild rash up to 24 hours after the test. This is not dangerous. If this occurs, you can take Benadryl 25 mg and increase your fluid intake. . If you experience trouble breathing, this can be serious. If it is severe call 911 IMMEDIATELY. If it is mild, please call our office.   Once we have confirmed authorization from your insurance company, we will call you to set up a date and time for your test.   For non-scheduling related questions, please contact the cardiac imaging nurse navigator should you have any questions/concerns: Marchia Bond, RN Navigator Cardiac Imaging [facility] [phone] Office

## 2019-10-24 ENCOUNTER — Encounter: Payer: Self-pay | Admitting: Physical Therapy

## 2019-10-24 ENCOUNTER — Ambulatory Visit: Payer: Self-pay | Attending: Family Medicine | Admitting: Physical Therapy

## 2019-10-24 ENCOUNTER — Other Ambulatory Visit: Payer: Self-pay

## 2019-10-24 DIAGNOSIS — M25512 Pain in left shoulder: Secondary | ICD-10-CM | POA: Insufficient documentation

## 2019-10-24 DIAGNOSIS — M25511 Pain in right shoulder: Secondary | ICD-10-CM | POA: Insufficient documentation

## 2019-10-24 DIAGNOSIS — M542 Cervicalgia: Secondary | ICD-10-CM

## 2019-10-24 DIAGNOSIS — M25611 Stiffness of right shoulder, not elsewhere classified: Secondary | ICD-10-CM

## 2019-10-24 DIAGNOSIS — M25612 Stiffness of left shoulder, not elsewhere classified: Secondary | ICD-10-CM

## 2019-10-24 DIAGNOSIS — G8929 Other chronic pain: Secondary | ICD-10-CM

## 2019-10-24 NOTE — Patient Instructions (Signed)
[email or web address] Access code: [email or web address]

## 2019-10-25 ENCOUNTER — Encounter: Payer: Self-pay | Admitting: Physical Therapy

## 2019-10-25 NOTE — Congregational Nurse Program (Signed)
CN office visit with interpreter Diu Hartshorn assisting.  Patient states he has a lot of appointments scheduled and needs help understanding details for each one.  He thought one was for a cardiac CT scan but phone call to cardiologist office determined it had not been scheduled.  We set it up for Dec. 1, 2020 at 11:30 am at [facility] radiology.  We made patient a list of all upcoming appointments including times, location and purpose of each visit scheduled thru Jan. 6, 2021. Jake Michaelis RN, Congregational Nurse [phone]

## 2019-10-25 NOTE — Therapy (Signed)
[place], [place], [address] Phone: [phone]   Fax:  [phone]  Physical Therapy Evaluation  Patient Details  Name: Eric Ingram MRN: [id number] Date of Birth: [birth year] Referring Provider (PT): Dickie La, MD   Encounter Date: 10/24/2019  PT End of Session - 10/24/19 1353    Visit Number  1    Number of Visits  12    Date for PT Re-Evaluation  12/05/19    Authorization Type  Self Pay    PT Start Time  1400    PT Stop Time  1445    PT Time Calculation (min)  45 min    Activity Tolerance  Patient tolerated treatment well;Patient limited by pain    Behavior During Therapy  [facility] for tasks assessed/performed       Past Medical History:  Diagnosis Date  . Anemia   . CHF (congestive heart failure) ([place])   . Dental infection 08/02/2018  . GERD (gastroesophageal reflux disease)   . Hypertension   . Shingles rash 07/07/2019  . Thalassemia, alpha ([place])   . Viral gastritis 02/08/2019    Past Surgical History:  Procedure Laterality Date  . NO PAST SURGERIES      There were no vitals filed for this visit.   Subjective Assessment - 10/24/19 1351    Subjective  Patient reports bilateral shoulder pain and stiffness, neck pain that radiates up to the back of the head, left sided trunk/thoracic pain, and feeling of numbness on the inside portion of the left hand and forearm. He did had shingles in July on the left trunk, arm, chest, and shoulder and this worsened his symtpoms and the pain on the left trunk and numbness in the left arm continued symptoms from the shingles. He is not currently working for the past 3 years. He had an injection in both shoulders and this has helped a little bit with the shoulder pain. His neck pain will often result in headaches. Patient also reports dizziness that may be related to high blood pressure and memory loss.    Patient is accompained by:  Interpreter    Pertinent  History  HTN, CHF, recent shingles on left side, dizziness, patient reported memory loss    Limitations  Lifting;House hold activities    How long can you sit comfortably?  No limitation    How long can you stand comfortably?  No limitation    How long can you walk comfortably?  No limitation    Diagnostic tests  X-ray left shoulder and cervical - see report    Patient Stated Goals  Reduce pain and improve overhead function    Currently in Pain?  Yes    Pain Score  5     Pain Location  Shoulder    Pain Orientation  Right;Left    Pain Descriptors / Indicators  Aching;Tightness;Sharp;Burning    Pain Type  Chronic pain    Pain Onset  More than a month ago    Pain Frequency  Constant    Aggravating Factors   reaching overhead and activity    Pain Relieving Factors  Ice pack    Effect of Pain on Daily Activities  Limits ability to reach and lift overhead    Multiple Pain Sites  Yes    Pain Score  6    Pain Location  Neck    Pain Orientation  Upper    Pain Descriptors / Indicators  Dull;Shooting  Pain Type  Chronic pain    Pain Radiating Towards  back of head, can result in headache    Pain Onset  More than a month ago    Pain Frequency  Intermittent    Aggravating Factors   Pain occurs suddenly    Pain Relieving Factors  Ice pack    Effect of Pain on Daily Activities  Limited reaching or lifting overhead, turning head         [facility] PT Assessment - 10/25/19 0001      Assessment   Medical Diagnosis  Bilateral shoulder pain and stiffness    Referring Provider (PT)  Dickie La, MD    Prior Therapy  None      Precautions   Precautions  None      Restrictions   Weight Bearing Restrictions  No      Balance Screen   Has the patient fallen in the past 6 months  No    Has the patient had a decrease in activity level because of a fear of falling?   No    Is the patient reluctant to leave their home because of a fear of falling?   No      Prior Function   Level of Independence   Independent      Cognition   Overall Cognitive Status  Within Functional Limits for tasks assessed    Memory  Appears intact   patient reports recent memory problems     Observation/Other Assessments   Observations  Patient appeared guarded with upper extremities and neck movement    Focus on Therapeutic Outcomes (FOTO)   50% limited      Sensation   Light Touch  Appears Intact;Impaired Detail    Light Touch Impaired Details  Impaired LUE   medial forearm impaired sensation due to shingles     Posture/Postural Control   Posture/Postural Control  Postural limitations    Postural Limitations  Rounded Shoulders;Forward head;Decreased thoracic kyphosis    Posture Comments  Patient displayed difficulty assuming a proper posture with tactile and verbal cueing, he reported upper neck and posterior shoulder pain      ROM / Strength   AROM / PROM / Strength  AROM;PROM;Strength      AROM   AROM Assessment Site  Cervical;Shoulder    Right/Left Shoulder  Right;Left    Right Shoulder Flexion  100 Degrees    Right Shoulder ABduction  90 Degrees    Right Shoulder External Rotation  30 Degrees    Left Shoulder Flexion  90 Degrees    Left Shoulder ABduction  90 Degrees    Left Shoulder External Rotation  15 Degrees    Cervical Flexion  40    Cervical Extension  30    Cervical - Right Side Bend  40    Cervical - Left Side Bend  45    Cervical - Right Rotation  55    Cervical - Left Rotation  55      PROM   PROM Assessment Site  Shoulder    Right/Left Shoulder  Right;Left    Right Shoulder Flexion  110 Degrees    Right Shoulder External Rotation  30 Degrees    Left Shoulder Flexion  95 Degrees    Left Shoulder External Rotation  20 Degrees      Strength   Overall Strength Comments  Shoulder limited secondary to pain, all performed within available range    Strength Assessment Site  Shoulder;Elbow;Wrist  Right/Left Shoulder  Right;Left    Right Shoulder Flexion  4/5    Right  Shoulder Extension  4/5    Right Shoulder ABduction  4-/5    Right Shoulder Internal Rotation  4/5    Right Shoulder External Rotation  4-/5    Left Shoulder Flexion  4-/5    Left Shoulder Extension  4-/5    Left Shoulder ABduction  4-/5    Left Shoulder Internal Rotation  4-/5    Right/Left Elbow  Right;Left    Right Elbow Flexion  5/5    Right Elbow Extension  5/5    Left Elbow Flexion  5/5    Left Elbow Extension  5/5    Right/Left Wrist  Right;Left    Right Wrist Flexion  5/5    Right Wrist Extension  5/5    Left Wrist Flexion  5/5    Left Wrist Extension  5/5      Flexibility   Soft Tissue Assessment /Muscle Length  yes   Bilat upper trap tightness     Palpation   Palpation comment  Patient exhibited high sensitivity with light palpation to bilat posterior shoulders, upper trap region, cervical paraspinals      Special Tests    Special Tests  --   Not tested due to pain and range of motion limitation     Transfers   Transfers  Independent with all Transfers                Objective measurements completed on examination: See above findings.      [facility] Adult PT Treatment/Exercise - 10/25/19 0001      Self-Care   Self-Care  Posture    Posture  Importance of improving posture      Exercises   Exercises  Shoulder;Neck      Shoulder Exercises: Supine   External Rotation  AAROM;Both;10 reps    Flexion  AAROM;Both;10 reps      Shoulder Exercises: Seated   Row  10 reps    Theraband Level (Shoulder Row)  Level 1 (Yellow)      Neck Exercises: Stretches   Upper Trapezius Stretch  Right;Left;20 seconds    Levator Stretch  Right;Left;20 seconds             PT Education - 10/24/19 1353    Education Details  Exam findings, anatomy and nature of condition, POC, HEP, postural improvement    Person(s) Educated  Patient    Methods  Explanation;Demonstration;Verbal cues;Handout;Tactile cues    Comprehension  Verbalized understanding;Verbal cues  required;Need further instruction;Tactile cues required       PT Short Term Goals - 10/24/19 1654      PT SHORT TERM GOAL #1   Title  Patient will be independent with initial HEP to allow for improvement with PT.    Time  4    Period  Weeks    Status  New    Target Date  11/21/19        PT Long Term Goals - 10/24/19 1650      PT LONG TERM GOAL #1   Title  Patient will exhibit bilateral shoulder elevation > or = 140 to allow for him to reach into high cabinets.    Time  8    Period  Weeks    Status  New    Target Date  12/19/19      PT LONG TERM GOAL #2   Title  Patient will report shoulder and  neck pain < or = 1-2/10 in order to allow for improved sleeping ability.    Time  8    Period  Weeks    Status  New    Target Date  12/19/19      PT LONG TERM GOAL #3   Title  Patient will report improved functional ability of < or = 34% limitation on FOTO.    Time  8    Period  Weeks    Status  New    Target Date  12/19/19      PT LONG TERM GOAL #4   Title  Patient will be independent with final HEP to allow him to maintain the progress he made in PT.    Time  8    Period  Weeks    Status  New    Target Date  12/19/19             Plan - 10/24/19 1354    Clinical Impression Statement  Patient presents to physical therapy with report of chronic bilateral shoulder and neck pain and stiffness. He also is having left sided trunk/thoracic rib pain and left forearm and hand numbness that seem to be a persistent symptoms from his shingles in July. He exhibits limited bilateral shoulder and cervical motion, with strength deficits that seems to be limited more by pain than true weakness. He also displays poor seated posture and has difficulty correcting his posture with verbal and tactile cueing. His symptoms due to seem to be cervical radicular in nature, but more consistent with bilateral frozen shoulder and cervicogenic pain and headaches. He was provided with exercises to  improve his shoulder and neck motion and posture, and he would benefit from continued skilled PT to improve his overhead function and reduce his pain.    Personal Factors and Comorbidities  Past/Current Experience;Social Background;Comorbidity 2    Comorbidities  HTN, shingles    Examination-Activity Limitations  Reach Overhead;Dressing;Bathing;Lift    Examination-Participation Restrictions  Community Activity;[place]   Job working   [employer]  Evolving/Moderate complexity    Clinical Decision Making  Moderate    Rehab Potential  Good    PT Frequency  1x / week    PT Duration  6 weeks    PT Treatment/Interventions  ADLs/Self Care Home Management;Cryotherapy;Electrical Stimulation;Moist Heat;Ultrasound;Therapeutic activities;Therapeutic exercise;Neuromuscular re-education;Patient/family education;Manual techniques;Passive range of motion;Dry needling;Taping;Spinal Manipulations;Joint Manipulations    PT Next Visit Plan  Assess HEP, progress shoulder motion with manual therapy and stretching, progress AAROM with dowel, postural strengthening, address cervical and thoracic mobility deficits    PT Home Exercise Plan  Supine AAROM elevation and ER with dowel, seated rows with yellow band, upper trap/levator stretch    Consulted and Agree with Plan of Care  Patient       Patient will benefit from skilled therapeutic intervention in order to improve the following deficits and impairments:  Decreased range of motion, Decreased strength, Postural dysfunction, Pain, Impaired sensation, Impaired flexibility, Decreased activity tolerance  Visit Diagnosis: Chronic left shoulder pain  Chronic right shoulder pain  Stiffness of left shoulder, not elsewhere classified  Stiffness of right shoulder, not elsewhere classified  Cervicalgia     Problem List Patient Active Problem List   Diagnosis Date Noted  . Viral URI 10/10/2019  . Cervical radiculopathy 09/12/2019   . Post herpetic neuralgia 08/29/2019  . EKG abnormalities 08/29/2019  . Adhesive capsulitis of both shoulders 08/29/2019  . Gastro-esophageal reflux 07/18/2019  . Wheezing on  both sides of chest 02/16/2019  . Tachycardia 02/08/2019  . Cough 02/08/2019  . Constipation 02/08/2019  . Shoulder pain, left 02/08/2019  . Abdominal bloating 11/07/2018  . Acute on chronic diastolic heart failure ([place])   . Iron deficiency anemia due to chronic blood loss   . Chronic sinusitis 01/05/2018  . Health care maintenance 01/05/2018  . Hypertension 10/16/2017    Hilda Blades, PT, DPT, LAT, ATC 10/25/19  8:24 AM Phone: [phone] Fax: [place] [facility] [address] [place], [place], [address] Phone: [phone]   Fax:  [phone]  Name: Ellwyn Hasbun MRN: [id number] Date of Birth: [birth year]

## 2019-11-01 ENCOUNTER — Ambulatory Visit (HOSPITAL_COMMUNITY): Payer: No Typology Code available for payment source | Attending: Cardiovascular Disease

## 2019-11-01 DIAGNOSIS — R072 Precordial pain: Secondary | ICD-10-CM

## 2019-11-06 ENCOUNTER — Encounter: Payer: Self-pay | Admitting: Physical Therapy

## 2019-11-06 ENCOUNTER — Ambulatory Visit: Payer: Self-pay | Admitting: Physical Therapy

## 2019-11-06 ENCOUNTER — Other Ambulatory Visit: Payer: Self-pay

## 2019-11-06 DIAGNOSIS — M25512 Pain in left shoulder: Secondary | ICD-10-CM

## 2019-11-06 DIAGNOSIS — M542 Cervicalgia: Secondary | ICD-10-CM

## 2019-11-06 DIAGNOSIS — M25612 Stiffness of left shoulder, not elsewhere classified: Secondary | ICD-10-CM

## 2019-11-06 DIAGNOSIS — G8929 Other chronic pain: Secondary | ICD-10-CM

## 2019-11-06 DIAGNOSIS — M25611 Stiffness of right shoulder, not elsewhere classified: Secondary | ICD-10-CM

## 2019-11-06 NOTE — Therapy (Addendum)
[place], [place], [address] Phone: [phone]   Fax:  [phone]  Physical Therapy Treatment/Discharge  Patient Details  Name: Eric Ingram MRN: [id number] Date of Birth: [birth year] Referring Provider (PT): Dickie La, MD   Encounter Date: 11/06/2019  PT End of Session - 11/06/19 0932    Visit Number  2    Number of Visits  12    Date for PT Re-Evaluation  12/05/19    Authorization Type  Self Pay    PT Start Time  0932    PT Stop Time  1010    PT Time Calculation (min)  38 min    Activity Tolerance  Patient tolerated treatment well    Behavior During Therapy  [facility] for tasks assessed/performed       Past Medical History:  Diagnosis Date  . Anemia   . CHF (congestive heart failure) ([place])   . Dental infection 08/02/2018  . GERD (gastroesophageal reflux disease)   . Hypertension   . Shingles rash 07/07/2019  . Thalassemia, alpha ([place])   . Viral gastritis 02/08/2019    Past Surgical History:  Procedure Laterality Date  . NO PAST SURGERIES      There were no vitals filed for this visit.  Subjective Assessment - 11/06/19 0935    Subjective  Reports that pain starts from Lt ribs up to Lt shoulder and into left shoulder. Denies pain in Rt shoulder today. It has been hard to sleep for 3-4 nights.    Patient Stated Goals  Reduce pain and improve overhead function         [facility] PT Assessment - 11/06/19 0001      Palpation   Palpation comment  low sensitivity to light touch today but reported palpation to intercostal muscles created concordant pain                   [facility] Adult PT Treatment/Exercise - 11/06/19 0001      Neck Exercises: Seated   Other Seated Exercise  postural alignment- mirror for VC      Shoulder Exercises: Supine   Horizontal ABduction  10 reps;AROM      Shoulder Exercises: Stretch   Table Stretch - Flexion  5 reps    Table Stretch - External Rotation  5  reps;10 seconds      Manual Therapy   Manual Therapy  Soft tissue mobilization    Soft tissue mobilization  intercostals, pec major, upper trap             PT Education - 11/06/19 1010    Education Details  posture, anatomy of condition- shingles v muscular pain & progression    Person(s) Educated  Patient    Methods  Explanation;Demonstration;Tactile cues;Verbal cues    Comprehension  Verbalized understanding;Returned demonstration;Verbal cues required;Tactile cues required;Need further instruction       PT Short Term Goals - 10/24/19 1654      PT SHORT TERM GOAL #1   Title  Patient will be independent with initial HEP to allow for improvement with PT.    Time  4    Period  Weeks    Status  New    Target Date  11/21/19        PT Long Term Goals - 10/24/19 1650      PT LONG TERM GOAL #1   Title  Patient will exhibit bilateral shoulder elevation > or = 140 to allow for him to  reach into high cabinets.    Time  8    Period  Weeks    Status  New    Target Date  12/19/19      PT LONG TERM GOAL #2   Title  Patient will report shoulder and neck pain < or = 1-2/10 in order to allow for improved sleeping ability.    Time  8    Period  Weeks    Status  New    Target Date  12/19/19      PT LONG TERM GOAL #3   Title  Patient will report improved functional ability of < or = 34% limitation on FOTO.    Time  8    Period  Weeks    Status  New    Target Date  12/19/19      PT LONG TERM GOAL #4   Title  Patient will be independent with final HEP to allow him to maintain the progress he made in PT.    Time  8    Period  Weeks    Status  New    Target Date  12/19/19            Plan - 11/06/19 1013    Clinical Impression Statement  Low suspicion that shingles was the main problem today. He was able to tolerate moderate pressure to myofasical trigger points with education and demonstrated near- [facility] ROM with mild discomfort. Will work on his posture but required  multiple tactile cues and verbal reminders. He reports he is worried about his heart- I do not believe his symptoms have any cardiac input today, however, if he ever feels that his heart is having problems to please report to the ER.    PT Treatment/Interventions  ADLs/Self Care Home Management;Cryotherapy;Electrical Stimulation;Moist Heat;Ultrasound;Therapeutic activities;Therapeutic exercise;Neuromuscular re-education;Patient/family education;Manual techniques;Passive range of motion;Dry needling;Taping;Spinal Manipulations;Joint Manipulations    PT Next Visit Plan  Assess HEP, progress shoulder motion with manual therapy and stretching, progress AAROM with dowel, postural strengthening, address cervical and thoracic mobility deficits    PT Home Exercise Plan  Supine AAROM elevation and ER with dowel, seated rows with yellow band, upper trap/levator stretch, upright posture    Consulted and Agree with Plan of Care  Patient       Patient will benefit from skilled therapeutic intervention in order to improve the following deficits and impairments:  Decreased range of motion, Decreased strength, Postural dysfunction, Pain, Impaired sensation, Impaired flexibility, Decreased activity tolerance  Visit Diagnosis: Chronic left shoulder pain  Chronic right shoulder pain  Stiffness of left shoulder, not elsewhere classified  Stiffness of right shoulder, not elsewhere classified  Cervicalgia     Problem List Patient Active Problem List   Diagnosis Date Noted  . Viral URI 10/10/2019  . Cervical radiculopathy 09/12/2019  . Post herpetic neuralgia 08/29/2019  . EKG abnormalities 08/29/2019  . Adhesive capsulitis of both shoulders 08/29/2019  . Gastro-esophageal reflux 07/18/2019  . Wheezing on both sides of chest 02/16/2019  . Tachycardia 02/08/2019  . Cough 02/08/2019  . Constipation 02/08/2019  . Shoulder pain, left 02/08/2019  . Abdominal bloating 11/07/2018  . Acute on chronic  diastolic heart failure ([place])   . Iron deficiency anemia due to chronic blood loss   . Chronic sinusitis 01/05/2018  . Health care maintenance 01/05/2018  . Hypertension 10/16/2017   Novalee Horsfall C. Kharter Sestak PT, DPT 11/06/19 10:17 AM   [place], [place],  [address] Phone: [phone]   Fax:  [phone]  Name: Jermani Eberlein MRN: [id number] Date of Birth: 1960/12/29  PHYSICAL THERAPY DISCHARGE SUMMARY  Visits from Start of Care: 2  Current functional level related to goals / functional outcomes: See above   Remaining deficits: See above   Education / Equipment: Anatomy of condition, POC, HEP, exercise form/rationale  Plan: Patient agrees to discharge.  Patient goals were not met. Patient is being discharged due to not returning since the last visit.  ?????     Chantrice Hagg C. Rakayla Ricklefs PT, DPT 12/22/19 11:35 AM

## 2019-11-13 ENCOUNTER — Telehealth (HOSPITAL_COMMUNITY): Payer: Self-pay | Admitting: Emergency Medicine

## 2019-11-13 NOTE — Telephone Encounter (Signed)
Left message on voicemail with name and callback number Maribel Hadley RN Navigator Cardiac Imaging [facility] [phone] Office [phone] Cell  

## 2019-11-14 ENCOUNTER — Ambulatory Visit (HOSPITAL_COMMUNITY): Payer: Self-pay

## 2019-11-14 ENCOUNTER — Telehealth (HOSPITAL_COMMUNITY): Payer: Self-pay | Admitting: Emergency Medicine

## 2019-11-14 NOTE — Telephone Encounter (Signed)
Phone call to inform patient that appt for today is cancelled due to CT1 at [facility] being down. Told him that we would reschedule asap. Pt denies further questions   Marchia Bond RN Navigator Cardiac Imaging [facility] [phone] Office  [phone] Cell

## 2019-11-22 NOTE — Congregational Nurse Program (Signed)
CN office visit with interpreter Diu Hartshorn assisting.  Patient brought paperwork regarding appointment for cardiac C-T scan scheduled for 11/28/2019 @ 1:30 pm at [facility] radiology.  We reviewed pre-procedure instructions with him and sent him to [facility] on [facility]. to get his lab work done today.  Jake Michaelis RN, Congregational Nurse [phone]

## 2019-11-23 LAB — BASIC METABOLIC PANEL
BUN/Creatinine Ratio: 15 (ref 9–20)
BUN: 17 mg/dL (ref 6–24)
CO2: 24 mmol/L (ref 20–29)
Calcium: 9.1 mg/dL (ref 8.7–10.2)
Chloride: 100 mmol/L (ref 96–106)
Creatinine, Ser: 1.11 mg/dL (ref 0.76–1.27)
GFR calc Af Amer: 84 mL/min/{1.73_m2} (ref >59)
GFR calc non Af Amer: 73 mL/min/{1.73_m2} (ref >59)
Glucose: 72 mg/dL (ref 65–99)
Potassium: 4.7 mmol/L (ref 3.5–5.2)
Sodium: 138 mmol/L (ref 134–144)

## 2019-11-23 LAB — LIPID PANEL
Chol/HDL Ratio: 7.2 ratio — ABNORMAL HIGH (ref 0.0–5.0)
Cholesterol, Total: 260 mg/dL — ABNORMAL HIGH (ref 100–199)
HDL: 36 mg/dL — ABNORMAL LOW (ref >39)
LDL Chol Calc (NIH): 129 mg/dL — ABNORMAL HIGH (ref 0–99)
Triglycerides: 520 mg/dL — ABNORMAL HIGH (ref 0–149)
VLDL Cholesterol Cal: 95 mg/dL — ABNORMAL HIGH (ref 5–40)

## 2019-11-27 ENCOUNTER — Telehealth (HOSPITAL_COMMUNITY): Payer: Self-pay | Admitting: Emergency Medicine

## 2019-11-27 NOTE — Telephone Encounter (Signed)
Left message on voicemail with name and callback number Constantin Hillery RN Navigator Cardiac Imaging [facility] [phone] Office [phone] Cell  

## 2019-11-28 ENCOUNTER — Other Ambulatory Visit: Payer: Self-pay

## 2019-11-28 ENCOUNTER — Ambulatory Visit (HOSPITAL_COMMUNITY)
Admission: RE | Admit: 2019-11-28 | Discharge: 2019-11-28 | Disposition: A | Discharge status: Home / Self Care | Payer: Self-pay | Source: Ambulatory Visit | Attending: Cardiology | Admitting: Cardiology

## 2019-11-28 DIAGNOSIS — R072 Precordial pain: Secondary | ICD-10-CM | POA: Insufficient documentation

## 2019-11-28 IMAGING — CT CT HEART MORP W/ CTA COR W/ SCORE W/ CA W/CM &/OR W/O CM
4 of 7 series · 8 of 20 positions shown, 9 images · IV contrast (APPLIED)
Comparison: None.
COMPARISON: None.

Addendum:
EXAM:
OVER-READ INTERPRETATION  CT CHEST

The following report is an over-read performed by radiologist Dr.
OSMAR [REDACTED] on [DATE]. This
over-read does not include interpretation of cardiac or coronary
anatomy or pathology. The coronary CTA interpretation by the
cardiologist is attached.
CLINICAL DATA: 58M presents with chest pain
Cardiac/Coronary CTA
TECHNIQUE: The patient was scanned on a [employer] Force scanner.

[Series 6: best diast 73 % · axial · 0.39mm/px · z∈[+1055,+1092]mm · 2 of 276 slices shown, 3 images]
[im 92/276  vessel]
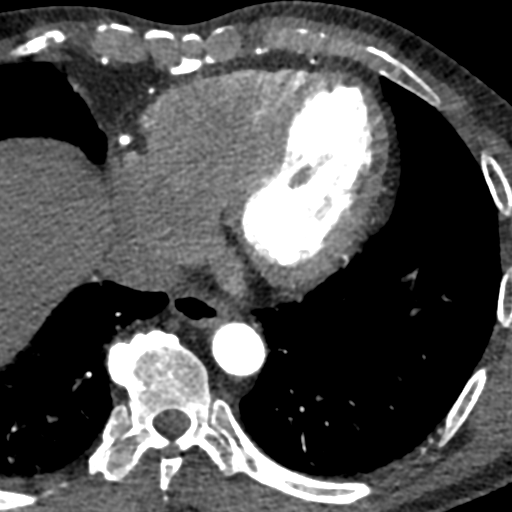
[im 92/276  lung]
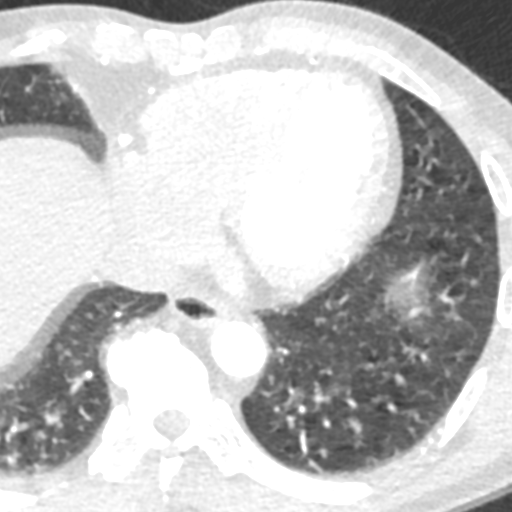
[im 184/276  vessel]
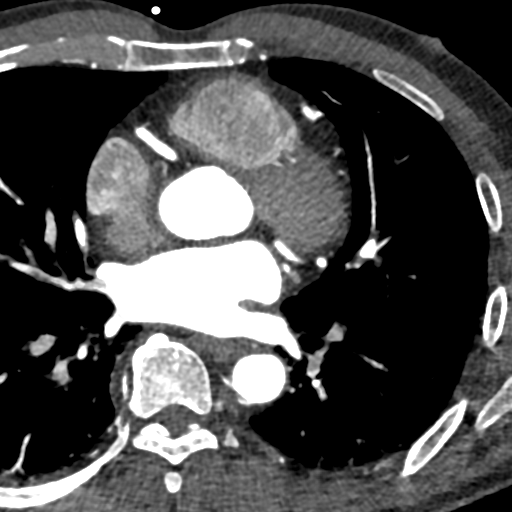

[Series 7: best syst 39 % · axial · 0.39mm/px · z∈[+1055,+1092]mm · 2 of 276 slices shown]
[im 92/276  vessel]
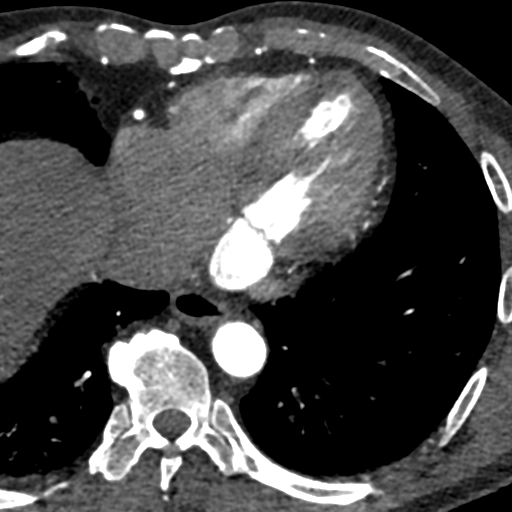
[im 184/276  vessel]
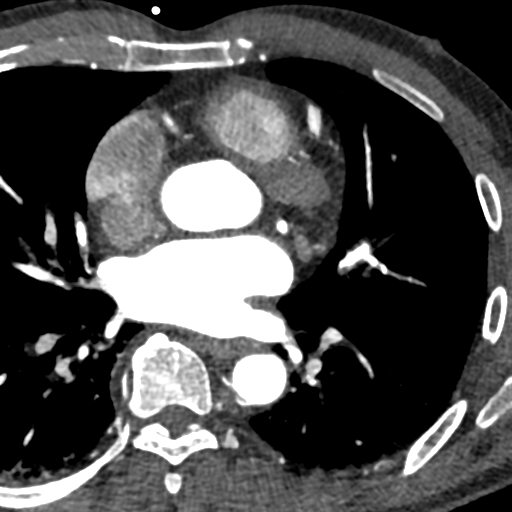

[Series 8: ts diast sharp 39 % · axial · 0.39mm/px · z∈[+1055,+1092]mm · 2 of 276 slices shown]
[im 92/276  lung]
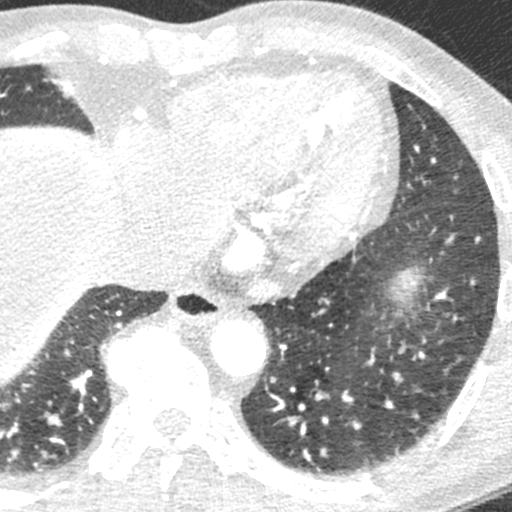
[im 184/276  lung]
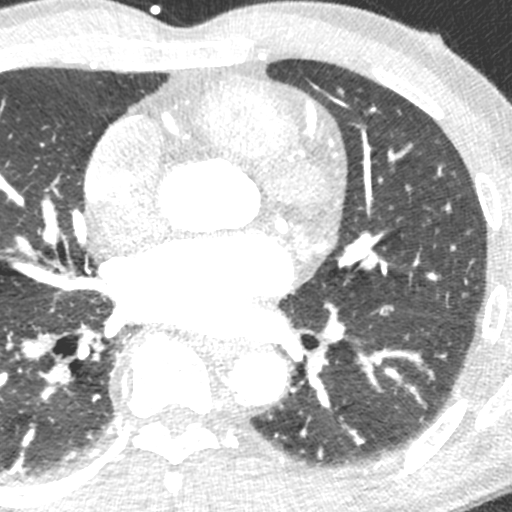

[Series 9: ts syst sharp 39 % · axial · 0.39mm/px · z∈[+1055,+1092]mm · 2 of 276 slices shown]
[im 92/276  lung]
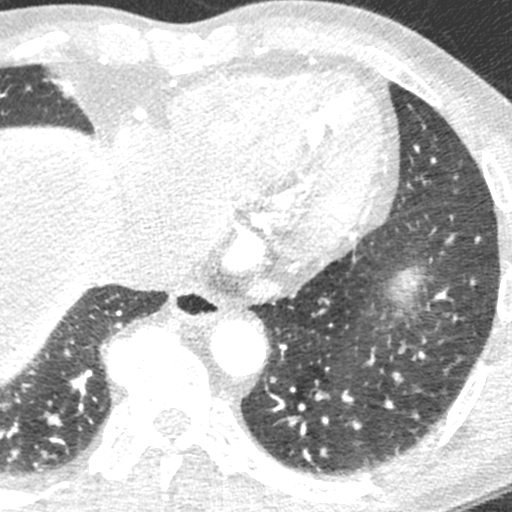
[im 184/276  lung]
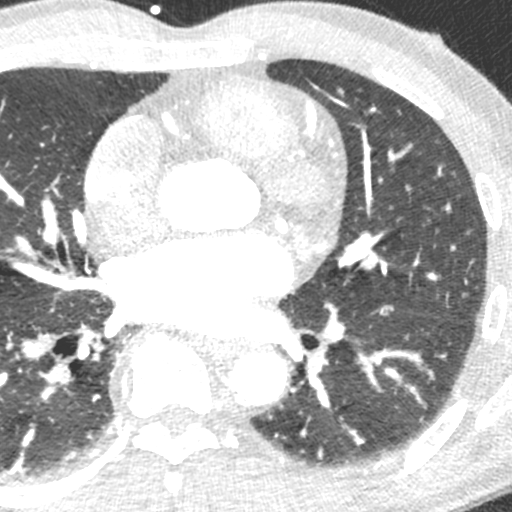

[8 of 20 positions shown; findings below may reference images not displayed]

FINDINGS: Vascular: Heart is normal size.  Aorta is normal caliber.

Mediastinum/Nodes: No adenopathy in the lower mediastinum or hila.

Lungs/Pleura: Visualized lungs clear.  No effusions.

Upper Abdomen: Imaging into the upper abdomen shows no acute
findings.

Musculoskeletal: Chest wall soft tissues are unremarkable. No acute
bony abnormality.
IMPRESSION: No acute or significant extracardiac abnormality.
FINDINGS: A 100 kV prospective scan was triggered in the descending thoracic
aorta at 111 HU's. Axial non-contrast 3 mm slices were carried out
through the heart. The data set was analyzed on a dedicated work
station and scored using the Agatson method. Gantry rotation speed
was 250 msecs and collimation was .6 mm. No beta blockade and 0.8 mg
of sl NTG was given. The 3D data set was reconstructed in 5%
intervals of the 67-82 % of the R-R cycle. Diastolic phases were
analyzed on a dedicated work station using MPR, MIP and VRT modes.
The patient received 80 cc of contrast.

Aorta: Normal size.

Aortic Valve:  Trileaflet.

Coronary Arteries:  Normal coronary origin.  Right dominance.

RCA is a large dominant artery that gives rise to PDA and PLA. There
is no plaque.

Left main is a large artery that gives rise to LAD and LCX arteries.

LAD is a large vessel that has no plaque.

LCX is a non-dominant artery that gives rise to one large OM1
branch. There is no plaque. Step artifact precludes interpretation
of a small branch off of OM1

Other findings:

Left Ventricle: Normal size

Left Atrium: Normal size

Pulmonary Veins: Normal configuration

Right Ventricle: Normal size

Right Atrium: Normal size

Thoracic aorta: Normal size

Pulmonary Arteries: Normal size

Systemic Veins: Normal drainage

Pericardium: Normal thickness
IMPRESSION: 1. Coronary calcium score of 0. This was 0 percentile for age and
sex matched control.

2. Normal coronary origin with right dominance.

3. No evidence of CAD.

4. Step artifact precludes interpretation of a small branch off of
OM1

CAD-RADS 0. No evidence of CAD (0%). Consider non-atherosclerotic
causes of chest pain.

*** End of Addendum ***
EXAM:
OVER-READ INTERPRETATION  CT CHEST

The following report is an over-read performed by radiologist Dr.
OSMAR [REDACTED] on [DATE]. This
over-read does not include interpretation of cardiac or coronary
anatomy or pathology. The coronary CTA interpretation by the
cardiologist is attached.
FINDINGS: Vascular: Heart is normal size.  Aorta is normal caliber.

Mediastinum/Nodes: No adenopathy in the lower mediastinum or hila.

Lungs/Pleura: Visualized lungs clear.  No effusions.

Upper Abdomen: Imaging into the upper abdomen shows no acute
findings.

Musculoskeletal: Chest wall soft tissues are unremarkable. No acute
bony abnormality.
IMPRESSION: No acute or significant extracardiac abnormality.

## 2019-11-28 MED ORDER — NITROGLYCERIN 0.4 MG SL SUBL
SUBLINGUAL_TABLET | SUBLINGUAL | Status: AC
  Filled 2019-11-28: qty 2

## 2019-11-28 MED ORDER — NITROGLYCERIN 0.4 MG SL SUBL
0.8000 mg | SUBLINGUAL_TABLET | Freq: Once | SUBLINGUAL | Status: AC
  Administered 2019-11-28: 14:00:00 0.8 mg via SUBLINGUAL

## 2019-11-28 MED ORDER — IOHEXOL 350 MG/ML SOLN
100.0000 mL | Freq: Once | INTRAVENOUS | Status: AC | PRN
  Administered 2019-11-28: 16:00:00 100 mL via INTRAVENOUS

## 2019-12-19 NOTE — Progress Notes (Deleted)
Cardiology Office Note:    Date:  12/19/2019   ID:  Eric Ingram, DOB 1961-05-21, MRN [id number]  PCP:  Eric Plater, MD  Cardiologist:  No primary care provider on file.  Electrophysiologist:  None   Referring MD: Eric Plater, MD   No chief complaint on file.  History of Present Illness:    Eric Ingram is a 59 y.o. male with a hx of hypertension, alpha thalassemia who presents for follow-up.  He was initially seen on 10/19/2019 for evaluation of chest pain and EKG abnormalities.  EKG demonstrated inferior Q waves.  He does report chest pain, occurring every other day or so.  Describes pain at center of chest, which she describes as burning pain.  Reports that has noted some association with exertion, particularly when he is lifting something.  He says when he walks will get pain on left upper adbomen/ lower chest, which he describes is different than the pain he gets in the center of his chest.  Also reports some shortness of breath.  He is on lisinopril 40 mg daily for hypertension, which he did not take this morning did not take BP meds this morning.  Quit smoking 20 years ago.  Does not know if family history of heart disease.    TTE was done on 11/01/2019, which showed normal LV systolic function, normal RV function, no significant valvular disease.  Coronary CTA on 11/28/2019 showed normal coronary arteries.  Past Medical History:  Diagnosis Date  . Anemia   . CHF (congestive heart failure) ([place])   . Dental infection 08/02/2018  . GERD (gastroesophageal reflux disease)   . Hypertension   . Shingles rash 07/07/2019  . Thalassemia, alpha ([place])   . Viral gastritis 02/08/2019    Past Surgical History:  Procedure Laterality Date  . NO PAST SURGERIES      Current Medications: No outpatient medications have been marked as taking for the 12/20/19 encounter (Appointment) with Donato Heinz, MD.     Allergies:   Patient has no known allergies.   Social History    Socioeconomic History  . Marital status: Married    Spouse name: Not on file  . Number of children: 4  . Years of education: Not on file  . Highest education level: Not on file  Occupational History  . Occupation: Unemployed  Tobacco Use  . Smoking status: Former Smoker    Quit date: 12/15/1999    Years since quitting: 20.0  . Smokeless tobacco: Never Used  Substance and Sexual Activity  . Alcohol use: No    Comment: former  . Drug use: No  . Sexual activity: Not on file  Other Topics Concern  . Not on file  Social History Narrative  . Not on file   Social Determinants of Health   Financial Resource Strain:   . Difficulty of Paying Living Expenses: Not on file  Food Insecurity:   . Worried About Charity fundraiser in the Last Year: Not on file  . Ran Out of Food in the Last Year: Not on file  Transportation Needs:   . Lack of Transportation (Medical): Not on file  . Lack of Transportation (Non-Medical): Not on file  Physical Activity:   . Days of Exercise per Week: Not on file  . Minutes of Exercise per Session: Not on file  Stress:   . Feeling of Stress : Not on file  Social Connections:   . Frequency of Communication with Friends and Family: Not  on file  . Frequency of Social Gatherings with Friends and Family: Not on file  . Attends Religious Services: Not on file  . Active Member of Clubs or Organizations: Not on file  . Attends Archivist Meetings: Not on file  . Marital Status: Not on file     Family History: The patient's family history is negative for Colon cancer and Stomach cancer.  ROS:   Please see the history of present illness.     All other systems reviewed and are negative.  EKGs/Labs/Other Studies Reviewed:    The following studies were reviewed today:  Coronary CTA 11/28/19: 1. Coronary calcium score of 0. This was 0 percentile for age and sex matched control. 2. Normal coronary origin with right dominance. 3. No evidence of  CAD. 4. Step artifact precludes interpretation of a small branch off of OM1  CAD-RADS 0. No evidence of CAD (0%). Consider non-atherosclerotic causes of chest pain.  TTE 11/01/19: 1. Left ventricular ejection fraction, by visual estimation, is 60 to 65%. The left ventricle has normal function. There is no left ventricular hypertrophy.  2. Normal GLS -20.  3. Global right ventricle has normal systolic function.The right ventricular size is normal. No increase in right ventricular wall thickness.  4. Left atrial size was normal.  5. Right atrial size was normal.  6. The mitral valve is normal in structure. Trace mitral valve regurgitation. No evidence of mitral stenosis.  7. The tricuspid valve is normal in structure. Tricuspid valve regurgitation is trivial.  8. The aortic valve is tricuspid. Aortic valve regurgitation is not visualized. Mild aortic valve sclerosis without stenosis.  9. The pulmonic valve was normal in structure. Pulmonic valve regurgitation is trivial. 10. The inferior vena cava is normal in size with greater than 50% respiratory variability, suggesting right atrial pressure of 3 mmHg.  EKG:  EKG is  ordered today.  The ekg ordered today demonstrates normal sinus rhythm, rate 80, no ST/T abnormalities, Q waves in inferior leads not meeting pathologic criteria  Recent Labs: 02/08/2019: ALT 17; Hemoglobin 11.1; Platelets 359 11/22/2019: BUN 17; Creatinine, Ser 1.11; Potassium 4.7; Sodium 138  Recent Lipid Panel    Component Value Date/Time   CHOL 260 (H) 11/22/2019 1041   TRIG 520 (H) 11/22/2019 1041   HDL 36 (L) 11/22/2019 1041   CHOLHDL 7.2 (H) 11/22/2019 1041   CHOLHDL 3.9 02/09/2018 0623   VLDL 24 02/09/2018 0623   LDLCALC 129 (H) 11/22/2019 1041    Physical Exam:    VS:  There were no vitals taken for this visit.    Wt Readings from Last 3 Encounters:  10/19/19 138 lb (62.6 kg)  10/13/19 135 lb (61.2 kg)  10/10/19 137 lb 4.8 oz (62.3 kg)     GEN:  Well  nourished, well developed in no acute distress HEENT: Normal NECK: No JVD LYMPHATICS: No lymphadenopathy CARDIAC: RRR, no murmurs, rubs, gallops RESPIRATORY:  Clear to auscultation without rales, wheezing or rhonchi  ABDOMEN: Soft, non-tender, non-distended MUSCULOSKELETAL:  No edema; you can copy paste that back back-and-forth at the HD aVF 21 2 deformity  SKIN: Warm and dry NEUROLOGIC:  Alert and oriented x 3 PSYCHIATRIC:  Normal affect   ASSESSMENT:    No diagnosis found. PLAN:    In order of problems listed above:  Chest pain: Normal coronaries on CTA, suggesting chest pain is noncardiac.  Hypertension: on lisinopril 40 mg daily.  Elevated in clinic today but did not take his medication this  morning   RTC in ###   Medication Adjustments/Labs and Tests Ordered: Current medicines are reviewed at length with the patient today.  Concerns regarding medicines are outlined above.  No orders of the defined types were placed in this encounter.  No orders of the defined types were placed in this encounter.   There are no Patient Instructions on file for this visit.   Signed, Donato Heinz, MD  12/19/2019 10:02 PM    [place]

## 2019-12-20 ENCOUNTER — Ambulatory Visit: Payer: Self-pay | Admitting: Cardiology

## 2019-12-21 ENCOUNTER — Other Ambulatory Visit: Payer: Self-pay | Admitting: Internal Medicine

## 2019-12-21 DIAGNOSIS — M5412 Radiculopathy, cervical region: Secondary | ICD-10-CM

## 2019-12-21 DIAGNOSIS — B0229 Other postherpetic nervous system involvement: Secondary | ICD-10-CM

## 2019-12-22 NOTE — Telephone Encounter (Signed)
Needs refill on gabapentin (NEURONTIN) 300 MG capsule  pt contact [place], [place] - [address] [facility].

## 2019-12-27 NOTE — Congregational Nurse Program (Signed)
CN office visit with interpreter Diu Hartshorn assisting. Patient came asking for help applying for disability.  He said his children tried to do it online but were not successful.  We called [employer] and after a brief screening he was scheduled for a telephone appointment on Wednesday 01/10/2020 at 11:00 am.  Patient informed he will need to bring his citizenship papers, his wife's income information as well as bank statements.  CN will help him obtain medical information.  Jake Michaelis RN, Congregational Nurse [phone]

## 2020-01-10 NOTE — Congregational Nurse Program (Signed)
CN office visit with interpreter Diu Hartshorn assisting by phone.  Patient had a telephone appointment today  with Mr. Stephens November at the [place] office to apply for disability.  We helped him complete the interview required. Patient stated he is out of Pantoprazole and has no refills.  Phone call to [facility] out-patient pharmacy.  He has another prescription on file from 03/2019 and  CN requested our program be billed since he states he has no money to buy medicine. He will pick up prescription on his way home today.  Jake Michaelis RN, Congregational Nurse [phone].

## 2020-01-29 ENCOUNTER — Ambulatory Visit: Payer: No Typology Code available for payment source

## 2020-01-29 ENCOUNTER — Other Ambulatory Visit: Payer: Self-pay

## 2020-02-09 ENCOUNTER — Ambulatory Visit (INDEPENDENT_AMBULATORY_CARE_PROVIDER_SITE_OTHER): Payer: No Typology Code available for payment source | Admitting: Radiation Oncology

## 2020-02-09 ENCOUNTER — Encounter: Payer: Self-pay | Admitting: Radiation Oncology

## 2020-02-09 VITALS — BP 145/89 | HR 82 | Temp 97.9°F | Ht 62.0 in | Wt 142.6 lb

## 2020-02-09 DIAGNOSIS — Z79899 Other long term (current) drug therapy: Secondary | ICD-10-CM

## 2020-02-09 DIAGNOSIS — K219 Gastro-esophageal reflux disease without esophagitis: Secondary | ICD-10-CM

## 2020-02-09 DIAGNOSIS — M5412 Radiculopathy, cervical region: Secondary | ICD-10-CM

## 2020-02-09 DIAGNOSIS — K5900 Constipation, unspecified: Secondary | ICD-10-CM

## 2020-02-09 DIAGNOSIS — M7502 Adhesive capsulitis of left shoulder: Secondary | ICD-10-CM

## 2020-02-09 DIAGNOSIS — I10 Essential (primary) hypertension: Secondary | ICD-10-CM

## 2020-02-09 DIAGNOSIS — M7501 Adhesive capsulitis of right shoulder: Secondary | ICD-10-CM

## 2020-02-09 DIAGNOSIS — M778 Other enthesopathies, not elsewhere classified: Secondary | ICD-10-CM

## 2020-02-09 MED ORDER — PANTOPRAZOLE SODIUM 40 MG PO TBEC: 40.0000 mg | DELAYED_RELEASE_TABLET | Freq: Two times a day (BID) | ORAL | 2 refills | Status: DC

## 2020-02-09 MED ORDER — DICLOFENAC SODIUM 1 % EX GEL: 2.0000 g | Freq: Four times a day (QID) | CUTANEOUS | 2 refills | Status: DC

## 2020-02-09 MED ORDER — LISINOPRIL 40 MG PO TABS: 40.0000 mg | ORAL_TABLET | Freq: Every day | ORAL | 3 refills | Status: DC

## 2020-02-09 MED ORDER — NAPROXEN 500 MG PO TABS: 500.0000 mg | ORAL_TABLET | Freq: Two times a day (BID) | ORAL | 2 refills | Status: DC | PRN

## 2020-02-09 NOTE — Assessment & Plan Note (Signed)
This is chronic and uncontrolled. He reports one small, hard BM/day. He has previously used Miralax with adequate control. We also discussed the importance of diet. He states he mostly eats white rice. We discussed the importance of a diet rich in fruits and vegetables. He does not know how much water he drinks in a day. We discussed the importance of drinking lots of water.  He had some concerns that his constipation was due to his gabapentin but we discussed that that was unlikely as that is not a usual side effect and he suffered from constipation prior to starting gabapentin.   Plan: -eat a diet rich in fruits and vegetables -drink around 100 ml of water/day

## 2020-02-09 NOTE — Progress Notes (Signed)
   CC: shoulder pain  HPI:  [name] is a 59 y.o. male who presents to the Internal Medicine Clinic for follow up of their chronic medical problems. Please see Assessment and Plan for full HPI.  Past Medical History:  Diagnosis Date  . Anemia   . CHF (congestive heart failure) ([place])   . Dental infection 08/02/2018  . GERD (gastroesophageal reflux disease)   . Hypertension   . Shingles rash 07/07/2019  . Thalassemia, alpha ([place])   . Viral gastritis 02/08/2019   Review of Systems:  Please see Assessment and Plan for full ROS.  Physical Exam:  Vitals:   02/09/20 0910  BP: (!) 145/89  Pulse: 82  Temp: 97.9 F (36.6 C)  TempSrc: Oral  SpO2: 96%  Weight: 142 lb 9.6 oz (64.7 kg)  Height: 5\' 2"  (1.575 m)   Physical Exam  Constitutional: He is oriented to person, place, and time. No distress.  Cardiovascular: Normal rate, regular rhythm, normal heart sounds and intact distal pulses.  No murmur heard. Pulmonary/Chest: Effort normal and breath sounds normal. No respiratory distress. He exhibits no tenderness.  Abdominal: Soft. Bowel sounds are normal. He exhibits no distension. There is no abdominal tenderness.  Musculoskeletal:        General: Normal range of motion.  Neurological: He is alert and oriented to person, place, and time.  Skin: Skin is warm and dry. He is not diaphoretic. No erythema.  Psychiatric: Affect normal.  Nursing note and vitals reviewed.    Assessment & Plan:   See Encounters Tab for problem based charting.  Patient discussed with Dr. Lynnae January

## 2020-02-09 NOTE — Patient Instructions (Addendum)
Thank you for coming to your appointment. It was so nice to see you. Today we discussed  Eric Ingram was seen today for back pain and shoulder pain.  Diagnoses and all orders for this visit:  Cervical radiculopathy       -    Continue gabapentin; please take 2 pills, 3 times a day, EVERY day       -    Continue stretching daily   Essential hypertension -    Refilled lisinopril (ZESTRIL) 40 MG tablet; Take 1 tablet (40 mg total) by mouth daily.  Adhesive capsulitis of both shoulders -     diclofenac Sodium (VOLTAREN) 1 % GEL; Apply 2 g topically 4 (four) times daily.       -     naproxen (NAPROSYN) 500 MG tablet; Take 1 tablet (500 mg total) by       mouth 2 (two) times daily as needed.  Constipation, unspecified constipation type       -     Eat a diet rich in fruits and vegetables       -     Drink 100 ml of water a day or about 8-10 large glasses of water  Gastroesophageal reflux disease -     pantoprazole (PROTONIX) 40 MG tablet; Take 1 tablet (40 mg total) by mouth 2 (two) times daily.  Sincerely,  Al Decant, MD

## 2020-02-09 NOTE — Assessment & Plan Note (Signed)
This is chronic and uncontrolled. Patient has undergone physical therapy which was helpful. He continues to do some stretching on his own. He is on gabapentin for nerve pain but worries it is causing constipation thus he hasn't been taking it regularly. Discussed with patient that he had had constipation prior to starting gabapentin and that the two were likely unrelated. At his last appointment he was referred to sports medicine but he says he has too many medical bills as it is to see a sports medicine doctor. 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   Plan: -continue taking gabapentin daily as prescribed -neck and shoulder stretches

## 2020-02-09 NOTE — Progress Notes (Signed)
Internal Medicine Clinic Attending  Case discussed with Dr. Lanierat the time of the visit.  We reviewed the resident's history and exam and pertinent patient test results.  I agree with the assessment, diagnosis, and plan of care documented in the resident's note.   

## 2020-02-09 NOTE — Assessment & Plan Note (Signed)
This is chronic and uncontrolled. He has received steroid shots in the past that were unhelpful. At his last appointment he was referred to sports medicine but he says he has too many medical bills as it is to see a sports medicine doctor. 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   He has used voltaren gel in the past and it did not help much but he is willing to retry it. He is also amenable to naproxen.  Plan: -naproxen -voltaren gel

## 2020-02-09 NOTE — Assessment & Plan Note (Signed)
This is chronic. BP elevated today to 145/89 in setting of running out of his lisinopril a few days ago.   Plan: -refill lisinopril 40 mg

## 2020-02-09 NOTE — Assessment & Plan Note (Addendum)
This is chronic and controlled on protonix. Patient reports running out of his protonix.   Plan: -refill protonix

## 2020-02-21 ENCOUNTER — Encounter: Payer: Self-pay | Admitting: Cardiology

## 2020-02-22 ENCOUNTER — Emergency Department (HOSPITAL_COMMUNITY): Payer: HRSA Program

## 2020-02-22 ENCOUNTER — Inpatient Hospital Stay (HOSPITAL_COMMUNITY)
Admission: EM | Admit: 2020-02-22 | Discharge: 2020-02-26 | DRG: 177 | Disposition: A | Discharge status: Home / Self Care | Payer: HRSA Program | Attending: Internal Medicine | Admitting: Internal Medicine

## 2020-02-22 ENCOUNTER — Encounter (HOSPITAL_COMMUNITY): Payer: Self-pay | Admitting: Emergency Medicine

## 2020-02-22 ENCOUNTER — Other Ambulatory Visit: Payer: Self-pay

## 2020-02-22 DIAGNOSIS — U071 COVID-19: Principal | ICD-10-CM | POA: Diagnosis present

## 2020-02-22 DIAGNOSIS — Z87891 Personal history of nicotine dependence: Secondary | ICD-10-CM

## 2020-02-22 DIAGNOSIS — I10 Essential (primary) hypertension: Secondary | ICD-10-CM | POA: Diagnosis not present

## 2020-02-22 DIAGNOSIS — D560 Alpha thalassemia: Secondary | ICD-10-CM | POA: Diagnosis present

## 2020-02-22 DIAGNOSIS — J1282 Pneumonia due to coronavirus disease 2019: Secondary | ICD-10-CM | POA: Diagnosis present

## 2020-02-22 DIAGNOSIS — I5032 Chronic diastolic (congestive) heart failure: Secondary | ICD-10-CM | POA: Diagnosis present

## 2020-02-22 DIAGNOSIS — Z79899 Other long term (current) drug therapy: Secondary | ICD-10-CM

## 2020-02-22 DIAGNOSIS — K219 Gastro-esophageal reflux disease without esophagitis: Secondary | ICD-10-CM | POA: Diagnosis present

## 2020-02-22 DIAGNOSIS — J9601 Acute respiratory failure with hypoxia: Secondary | ICD-10-CM | POA: Diagnosis present

## 2020-02-22 DIAGNOSIS — J9600 Acute respiratory failure, unspecified whether with hypoxia or hypercapnia: Secondary | ICD-10-CM

## 2020-02-22 DIAGNOSIS — I110 Hypertensive heart disease with heart failure: Secondary | ICD-10-CM | POA: Diagnosis present

## 2020-02-22 LAB — BASIC METABOLIC PANEL
Anion gap: 14 (ref 5–15)
BUN: 14 mg/dL (ref 6–20)
CO2: 20 mmol/L — ABNORMAL LOW (ref 22–32)
Calcium: 8.9 mg/dL (ref 8.9–10.3)
Chloride: 103 mmol/L (ref 98–111)
Creatinine, Ser: 1.3 mg/dL — ABNORMAL HIGH (ref 0.61–1.24)
GFR calc Af Amer: >60 mL/min (ref >60)
GFR calc non Af Amer: >60 mL/min (ref >60)
Glucose, Bld: 135 mg/dL — ABNORMAL HIGH (ref 70–99)
Potassium: 3.9 mmol/L (ref 3.5–5.1)
Sodium: 137 mmol/L (ref 135–145)

## 2020-02-22 LAB — CBC WITH DIFFERENTIAL/PLATELET
Abs Immature Granulocytes: 0.13 10*3/uL — ABNORMAL HIGH (ref 0.00–0.07)
Basophils Absolute: 0.1 10*3/uL (ref 0.0–0.1)
Basophils Relative: 0 %
Eosinophils Absolute: 0.1 10*3/uL (ref 0.0–0.5)
Eosinophils Relative: 0 %
HCT: 35 % — ABNORMAL LOW (ref 39.0–52.0)
Hemoglobin: 11.3 g/dL — ABNORMAL LOW (ref 13.0–17.0)
Immature Granulocytes: 1 %
Lymphocytes Relative: 5 %
Lymphs Abs: 0.8 10*3/uL (ref 0.7–4.0)
MCH: 21.9 pg — ABNORMAL LOW (ref 26.0–34.0)
MCHC: 32.3 g/dL (ref 30.0–36.0)
MCV: 67.8 fL — ABNORMAL LOW (ref 80.0–100.0)
Monocytes Absolute: 0.8 10*3/uL (ref 0.1–1.0)
Monocytes Relative: 5 %
Neutro Abs: 15.9 10*3/uL — ABNORMAL HIGH (ref 1.7–7.7)
Neutrophils Relative %: 89 %
Platelets: 331 10*3/uL (ref 150–400)
RBC: 5.16 MIL/uL (ref 4.22–5.81)
RDW: 14.4 % (ref 11.5–15.5)
WBC: 17.7 10*3/uL — ABNORMAL HIGH (ref 4.0–10.5)
nRBC: 0 % (ref 0.0–0.2)

## 2020-02-22 LAB — FIBRINOGEN: Fibrinogen: >800 mg/dL — ABNORMAL HIGH (ref 210–475)

## 2020-02-22 LAB — HEPATIC FUNCTION PANEL
ALT: 30 U/L (ref 0–44)
AST: 63 U/L — ABNORMAL HIGH (ref 15–41)
Albumin: 3 g/dL — ABNORMAL LOW (ref 3.5–5.0)
Alkaline Phosphatase: 178 U/L — ABNORMAL HIGH (ref 38–126)
Bilirubin, Direct: 0.4 mg/dL — ABNORMAL HIGH (ref 0.0–0.2)
Indirect Bilirubin: 0.7 mg/dL (ref 0.3–0.9)
Total Bilirubin: 1.1 mg/dL (ref 0.3–1.2)
Total Protein: 8.1 g/dL (ref 6.5–8.1)

## 2020-02-22 LAB — LACTIC ACID, PLASMA
Lactic Acid, Venous: 2.3 mmol/L (ref 0.5–1.9)
Lactic Acid, Venous: 2.9 mmol/L (ref 0.5–1.9)
Lactic Acid, Venous: 2 mmol/L (ref 0.5–1.9)

## 2020-02-22 LAB — POC SARS CORONAVIRUS 2 AG -  ED: SARS Coronavirus 2 Ag: POSITIVE — AB

## 2020-02-22 LAB — TRIGLYCERIDES: Triglycerides: 242 mg/dL — ABNORMAL HIGH (ref <150)

## 2020-02-22 LAB — C-REACTIVE PROTEIN: CRP: 23.2 mg/dL — ABNORMAL HIGH (ref <1.0)

## 2020-02-22 LAB — LACTATE DEHYDROGENASE: LDH: 451 U/L — ABNORMAL HIGH (ref 98–192)

## 2020-02-22 LAB — D-DIMER, QUANTITATIVE: D-Dimer, Quant: 0.99 ug/mL-FEU — ABNORMAL HIGH (ref 0.00–0.50)

## 2020-02-22 LAB — PROCALCITONIN: Procalcitonin: 0.3 ng/mL

## 2020-02-22 LAB — FERRITIN: Ferritin: 838 ng/mL — ABNORMAL HIGH (ref 24–336)

## 2020-02-22 IMAGING — DX DG CHEST 1V PORT
1 series · 1 of 1 positions shown · non-contrast
Comparison: [DATE]

CLINICAL DATA: Shortness of breath and cough

EXAM:
PORTABLE CHEST 1 VIEW

[chest ap]
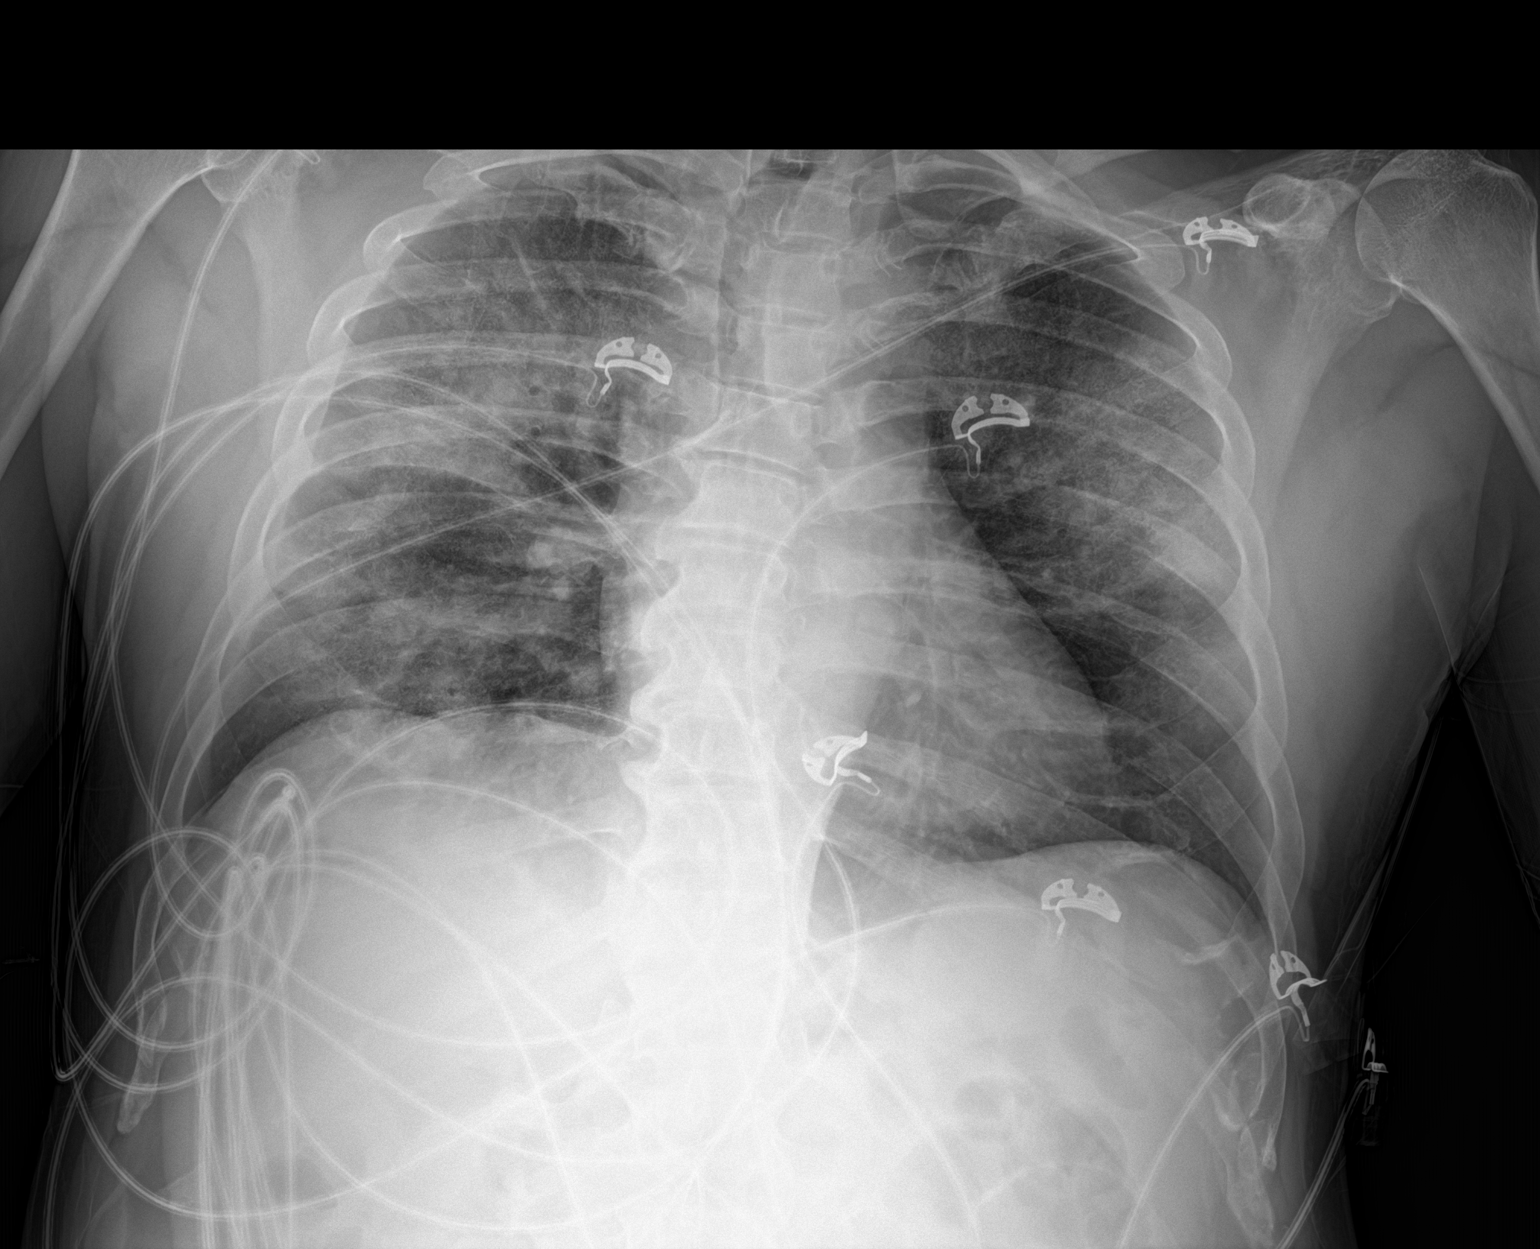

[1 of 1 positions shown; findings below may reference images not displayed]

FINDINGS: Patchy bilateral airspace disease consistent with pneumonia. No
visible effusion or pneumothorax. Normal heart size and mediastinal
contours.

Artifact from EKG leads
IMPRESSION: Patchy bilateral pneumonia, usually [69] associated.

## 2020-02-22 MED ORDER — PANTOPRAZOLE SODIUM 40 MG PO TBEC
40.0000 mg | DELAYED_RELEASE_TABLET | Freq: Two times a day (BID) | ORAL | Status: DC
  Administered 2020-02-22 – 2020-02-26 (×9): 40 mg via ORAL
  Filled 2020-02-22 (×9): qty 1

## 2020-02-22 MED ORDER — SODIUM CHLORIDE 0.9 % IV SOLN
100.0000 mg | Freq: Every day | INTRAVENOUS | Status: AC
  Administered 2020-02-23 – 2020-02-26 (×4): 100 mg via INTRAVENOUS
  Filled 2020-02-22 (×4): qty 20

## 2020-02-22 MED ORDER — THIAMINE HCL 100 MG PO TABS
100.0000 mg | ORAL_TABLET | Freq: Every day | ORAL | Status: DC
  Administered 2020-02-22 – 2020-02-26 (×5): 100 mg via ORAL
  Filled 2020-02-22 (×5): qty 1

## 2020-02-22 MED ORDER — GUAIFENESIN-DM 100-10 MG/5ML PO SYRP
10.0000 mL | ORAL_SOLUTION | ORAL | Status: DC | PRN
  Administered 2020-02-22 – 2020-02-23 (×2): 10 mL via ORAL
  Filled 2020-02-22 (×2): qty 10

## 2020-02-22 MED ORDER — IPRATROPIUM-ALBUTEROL 20-100 MCG/ACT IN AERS
1.0000 | INHALATION_SPRAY | Freq: Four times a day (QID) | RESPIRATORY_TRACT | Status: DC | PRN
  Filled 2020-02-22: qty 4

## 2020-02-22 MED ORDER — ADULT MULTIVITAMIN W/MINERALS CH
1.0000 | ORAL_TABLET | Freq: Every day | ORAL | Status: DC
  Administered 2020-02-22 – 2020-02-26 (×5): 1 via ORAL
  Filled 2020-02-22 (×5): qty 1

## 2020-02-22 MED ORDER — ACETAMINOPHEN 325 MG PO TABS
650.0000 mg | ORAL_TABLET | Freq: Four times a day (QID) | ORAL | Status: DC | PRN
  Administered 2020-02-26: 650 mg via ORAL
  Filled 2020-02-22: qty 2

## 2020-02-22 MED ORDER — GABAPENTIN 300 MG PO CAPS: 600.0000 mg | ORAL_CAPSULE | Freq: Three times a day (TID) | ORAL | Status: DC | PRN

## 2020-02-22 MED ORDER — DEXAMETHASONE SODIUM PHOSPHATE 10 MG/ML IJ SOLN
6.0000 mg | INTRAMUSCULAR | Status: DC
  Administered 2020-02-23 – 2020-02-26 (×4): 6 mg via INTRAVENOUS
  Filled 2020-02-22 (×4): qty 1

## 2020-02-22 MED ORDER — ENOXAPARIN SODIUM 40 MG/0.4ML ~~LOC~~ SOLN
40.0000 mg | SUBCUTANEOUS | Status: DC
  Administered 2020-02-22 – 2020-02-25 (×4): 40 mg via SUBCUTANEOUS
  Filled 2020-02-22 (×4): qty 0.4

## 2020-02-22 MED ORDER — DEXAMETHASONE SODIUM PHOSPHATE 10 MG/ML IJ SOLN
6.0000 mg | Freq: Once | INTRAMUSCULAR | Status: AC
  Administered 2020-02-22: 6 mg via INTRAVENOUS
  Filled 2020-02-22: qty 1

## 2020-02-22 MED ORDER — FOLIC ACID 1 MG PO TABS
1.0000 mg | ORAL_TABLET | Freq: Every day | ORAL | Status: DC
  Administered 2020-02-22 – 2020-02-26 (×5): 1 mg via ORAL
  Filled 2020-02-22 (×5): qty 1

## 2020-02-22 MED ORDER — FAMOTIDINE 20 MG PO TABS
20.0000 mg | ORAL_TABLET | Freq: Every day | ORAL | Status: DC
  Administered 2020-02-22 – 2020-02-25 (×4): 20 mg via ORAL
  Filled 2020-02-22 (×4): qty 1

## 2020-02-22 MED ORDER — SODIUM CHLORIDE 0.9 % IV SOLN
100.0000 mg | INTRAVENOUS | Status: AC
  Administered 2020-02-22 (×2): 100 mg via INTRAVENOUS
  Filled 2020-02-22 (×4): qty 20

## 2020-02-22 MED ORDER — HYDROCOD POLST-CPM POLST ER 10-8 MG/5ML PO SUER
5.0000 mL | Freq: Two times a day (BID) | ORAL | Status: DC | PRN
  Administered 2020-02-23 – 2020-02-24 (×2): 5 mL via ORAL
  Filled 2020-02-22 (×2): qty 5

## 2020-02-22 NOTE — Progress Notes (Signed)
Pt admitted from ED to 5W21. A&O. VS BP stable, HR & RR tachy but was like this in ED. No fever. MD called to be aware. Pt doesn't appear to be in distress. Skin intact. Call bell within reach. No c/o pain. All questions/concerns addressed. Will continue to monitor.      02/22/20 1103  Vitals  Temp 99.2 F (37.3 C)  Temp Source Oral  BP (!) 136/97  MAP (mmHg) 110  ECG Heart Rate (!) 114  Resp (!) 33  Oxygen Therapy  SpO2 (!) 89 %  O2 Device Non-rebreather Mask  O2 Flow Rate (L/min) 15 L/min  Pain Assessment  Pain Score 0  MEWS Score  MEWS Temp 0  MEWS Systolic 0  MEWS Pulse 2  MEWS RR 2  MEWS LOC 0  MEWS Score 4  MEWS Score Color Red  MEWS Assessment  Is this an acute change? Yes

## 2020-02-22 NOTE — ED Provider Notes (Signed)
[facility] EMERGENCY DEPARTMENT Provider Note   CSN: [id number] Arrival date & time: 02/22/20  0459     History Chief Complaint  Patient presents with  . Cogh/Chest Congestion/Lost Taste  Level 5 caveat due to acuity of condition  Eric Ingram is a 59 y.o. male.  The history is provided by the patient and a relative. The history is limited by the condition of the patient.  Cough Cough characteristics:  Dry Severity:  Moderate Onset quality:  Gradual Duration:  2 days Timing:  Constant Progression:  Worsening Chronicity:  New Relieved by:  None tried Worsened by:  Nothing Associated symptoms: shortness of breath   Patient presents with increasing dry cough and shortness of breath for the past 2 days.  He also reports loss of taste.  He also reports exertional dyspnea.  No fevers reported.  No vomiting.  Patient is accompanied by son-in-law.  History was limited due to language barrier patient is [place], as well as acuity of condition due to hypoxia    Past Medical History:  Diagnosis Date  . Anemia   . CHF (congestive heart failure) ([place])   . Dental infection 08/02/2018  . GERD (gastroesophageal reflux disease)   . Hypertension   . Shingles rash 07/07/2019  . Thalassemia, alpha ([place])   . Viral gastritis 02/08/2019    Patient Active Problem List   Diagnosis Date Noted  . Viral URI 10/10/2019  . Cervical radiculopathy 09/12/2019  . Post herpetic neuralgia 08/29/2019  . EKG abnormalities 08/29/2019  . Adhesive capsulitis of both shoulders 08/29/2019  . Gastro-esophageal reflux 07/18/2019  . Wheezing on both sides of chest 02/16/2019  . Tachycardia 02/08/2019  . Cough 02/08/2019  . Constipation 02/08/2019  . Shoulder pain, left 02/08/2019  . Abdominal bloating 11/07/2018  . Acute on chronic diastolic heart failure ([place])   . Iron deficiency anemia due to chronic blood loss   . Chronic sinusitis 01/05/2018  . Health care maintenance 01/05/2018    . Hypertension 10/16/2017    Past Surgical History:  Procedure Laterality Date  . NO PAST SURGERIES         Family History  Problem Relation Age of Onset  . Colon cancer Neg Hx   . Stomach cancer Neg Hx     Social History   Tobacco Use  . Smoking status: Former Smoker    Quit date: 12/15/1999    Years since quitting: 20.2  . Smokeless tobacco: Never Used  Substance Use Topics  . Alcohol use: No    Comment: former  . Drug use: No    Home Medications Prior to Admission medications   Medication Sig Start Date End Date Taking? Authorizing Provider  diclofenac Sodium (VOLTAREN) 1 % GEL Apply 2 g topically 4 (four) times daily. 02/09/20 03/10/20  Al Decant, MD  fluticasone (FLONASE) 50 MCG/ACT nasal spray Place 2 sprays into both nostrils daily. 10/11/18 04/09/19  Kathi Ludwig, MD  gabapentin (NEURONTIN) 300 MG capsule TAKE 2 CAPSULES BY MOUTH THREE TIMES DAILY. TAKE UP TO 2 CAPSULES IN THE MORNING, AFTERNOON, AND AT BEDTIME 12/22/19   Axel Filler, MD  lisinopril (ZESTRIL) 40 MG tablet Take 1 tablet (40 mg total) by mouth daily. 02/09/20   Al Decant, MD  naproxen (NAPROSYN) 500 MG tablet Take 1 tablet (500 mg total) by mouth 2 (two) times daily as needed. 02/09/20 02/08/21  Al Decant, MD  pantoprazole (PROTONIX) 40 MG tablet Take 1 tablet (40 mg total) by mouth 2 (two)  times daily. 02/09/20   Al Decant, MD  sucralfate (CARAFATE) 1 g tablet Take 1 tablet (1 g total) by mouth every 6 (six) hours as needed. Slowly dissolve 1 tablet in 1 tablespoon of water before taking 11/03/18   Lorella Nimrod, MD    Allergies    Patient has no known allergies.  Review of Systems   Review of Systems  Unable to perform ROS: Acuity of condition  Respiratory: Positive for cough and shortness of breath.     Physical Exam Updated Vital Signs BP 136/87   Pulse 99   Temp 98.7 F (37.1 C) (Oral)   Resp (!) 27   Ht 1.575 m (5\' 2" )   Wt 74.8 kg   SpO2 96%   BMI  30.18 kg/m   Physical Exam CONSTITUTIONAL: Well developed, ill-appearing HEAD: Normocephalic/atraumatic EYES: EOMI NECK: supple no meningeal signs SPINE/BACK:entire spine nontender CV: S1/S2 noted, no murmurs/rubs/gallops noted LUNGS: 68% on room air, tachypneic, crackles bilaterally, wheezing bilaterally ABDOMEN: soft, nontender NEURO: Pt is awake/alert/appropriate, moves all extremitiesx4.  No facial droop.   EXTREMITIES: pulses normal/equal, full ROM SKIN: warm, color normal PSYCH: no abnormalities of mood noted, alert and oriented to situation  ED Results / Procedures / Treatments   Labs (all labs ordered are listed, but only abnormal results are displayed) Labs Reviewed  CBC WITH DIFFERENTIAL/PLATELET - Abnormal; Notable for the following components:      Result Value   WBC 17.7 (*)    Hemoglobin 11.3 (*)    HCT 35.0 (*)    MCV 67.8 (*)    MCH 21.9 (*)    Neutro Abs 15.9 (*)    Abs Immature Granulocytes 0.13 (*)    All other components within normal limits  BASIC METABOLIC PANEL - Abnormal; Notable for the following components:   CO2 20 (*)    Glucose, Bld 135 (*)    Creatinine, Ser 1.30 (*)    All other components within normal limits  HEPATIC FUNCTION PANEL - Abnormal; Notable for the following components:   Albumin 3.0 (*)    AST 63 (*)    Alkaline Phosphatase 178 (*)    Bilirubin, Direct 0.4 (*)    All other components within normal limits  LACTIC ACID, PLASMA - Abnormal; Notable for the following components:   Lactic Acid, Venous 2.9 (*)    All other components within normal limits  D-DIMER, QUANTITATIVE (NOT AT [facility]) - Abnormal; Notable for the following components:   D-Dimer, Quant 0.99 (*)    All other components within normal limits  LACTATE DEHYDROGENASE - Abnormal; Notable for the following components:   LDH 451 (*)    All other components within normal limits  FIBRINOGEN - Abnormal; Notable for the following components:   Fibrinogen >800 (*)     All other components within normal limits  POC SARS CORONAVIRUS 2 AG -  ED - Abnormal; Notable for the following components:   SARS Coronavirus 2 Ag POSITIVE (*)    All other components within normal limits  CULTURE, BLOOD (ROUTINE X 2)  CULTURE, BLOOD (ROUTINE X 2)  LACTIC ACID, PLASMA  PROCALCITONIN  FERRITIN  TRIGLYCERIDES  C-REACTIVE PROTEIN    EKG EKG Interpretation  Date/Time:  Thursday February 22 2020 05:28:32 EST Ventricular Rate:  106 PR Interval:    QRS Duration: 83 QT Interval:  317 QTC Calculation: 421 R Axis:   75 Text Interpretation: Sinus tachycardia Probable left atrial enlargement No significant change since last tracing Confirmed by Christy Gentles,  Elenore Rota [phone]) on 02/22/2020 5:31:25 AM   Radiology DG Chest Portable 1 View  Result Date: 02/22/2020 CLINICAL DATA:  Shortness of breath and cough EXAM: PORTABLE CHEST 1 VIEW COMPARISON:  02/08/2019 FINDINGS: Patchy bilateral airspace disease consistent with pneumonia. No visible effusion or pneumothorax. Normal heart size and mediastinal contours. Artifact from EKG leads IMPRESSION: Patchy bilateral pneumonia, usually COVID-19 associated. Electronically Signed   By: Monte Fantasia M.D.   On: 02/22/2020 05:46    Procedures .Critical Care Performed by: Ripley Fraise, MD Authorized by: Ripley Fraise, MD   Critical care provider statement:    Critical care time (minutes):  31   Critical care start time:  02/22/2020 6:18 AM   Critical care end time:  02/22/2020 6:49 AM   Critical care time was exclusive of:  Separately billable procedures and treating other patients   Critical care was necessary to treat or prevent imminent or life-threatening deterioration of the following conditions:  Respiratory failure   Critical care was time spent personally by me on the following activities:  Ordering and review of radiographic studies, ordering and review of laboratory studies, pulse oximetry, re-evaluation of patient's  condition, evaluation of patient's response to treatment, development of treatment plan with patient or surrogate, examination of patient and obtaining history from patient or surrogate   I assumed direction of critical care for this patient from another provider in my specialty: no        Medications Ordered in ED Medications  dexamethasone (DECADRON) injection 6 mg (has no administration in time range)  remdesivir 100 mg in sodium chloride 0.9 % 100 mL IVPB (has no administration in time range)  remdesivir 100 mg in sodium chloride 0.9 % 100 mL IVPB (has no administration in time range)    ED Course  I have reviewed the triage vital signs and the nursing notes.  Pertinent labs & imaging results that were available during my care of the patient were reviewed by me and considered in my medical decision making (see chart for details).    MDM Rules/Calculators/A&P                      6:18 AM Patient was seen on arrival to the room.  Patient was acutely hypoxic to 68% on room air on arrival.  He was tachypneic and ill-appearing.  Patient did respond to supplemental oxygen.  His vital signs of improved X-ray appears to be consistent with COVID-19 pneumonia.  Labs are pending at this time. Will follow closely 6:47 AM Patient found to be Covid positive.  He is still requiring high flow oxygen.  Decadron and remdesivir has been ordered Patient reports he is feeling improved.  He will be admitted to the hospital 7:21 AM D/w internal medicine team for admission Pt continues on high flow oxygen pulse ox 88-90% and work of breathing improved Final Clinical Impression(s) / ED Diagnoses Final diagnoses:  Pneumonia due to COVID-19 virus  Acute respiratory failure with hypoxia [facility])    Rx / DC Orders ED Discharge Orders    None       Ripley Fraise, MD 02/22/20 [phone]

## 2020-02-22 NOTE — TOC Initial Note (Signed)
Transition of Care [facility]) - Initial/Assessment Note    Patient Details  Name: Eric Ingram MRN: [id number] Date of Birth: 1961-10-27  Transition of Care [facility]) CM/SW Contact:    Carles Collet, RN Phone Number: 02/22/2020, 2:14 PM  Clinical Narrative:    Patient from home w family. + COVID  3/11 High dose oxygen required. Remdesivir/ steroids  Follows at internal medicine clinic.  Is uninsured.  Appointment made for Friday 3/19 at [facility] in hopes he may be recovered and discharged by then. If not please call [phone] to reschedule appointment.              Expected Discharge Plan: [place] Barriers to Discharge: Continued Medical Work up   Patient Goals and CMS Choice        Expected Discharge Plan and Services Expected Discharge Plan: [place]                                              Prior Living Arrangements/Services                       Activities of Daily Living      Permission Sought/Granted                  Emotional Assessment              Admission diagnosis:  Acute respiratory failure with hypoxia ([place]) [J96.01] Acute respiratory failure due to COVID-19 ([place]) [U07.1, J96.00] Pneumonia due to COVID-19 virus [U07.1, J12.82] Patient Active Problem List   Diagnosis Date Noted  . Acute respiratory failure due to COVID-19 ([place]) 02/22/2020  . Viral URI 10/10/2019  . Cervical radiculopathy 09/12/2019  . Post herpetic neuralgia 08/29/2019  . EKG abnormalities 08/29/2019  . Adhesive capsulitis of both shoulders 08/29/2019  . Gastro-esophageal reflux 07/18/2019  . Wheezing on both sides of chest 02/16/2019  . Tachycardia 02/08/2019  . Cough 02/08/2019  . Constipation 02/08/2019  . Shoulder pain, left 02/08/2019  . Abdominal bloating 11/07/2018  . Acute on chronic diastolic heart failure ([place])   . Iron deficiency anemia due to chronic blood loss   . Chronic  sinusitis 01/05/2018  . Health care maintenance 01/05/2018  . Hypertension 10/16/2017   PCP:  Earlene Plater, MD Pharmacy:   [facility] [place] [id number] Phone: [phone] Fax: [place], [place] - [address] [place] [address] Phone: [phone] Fax: [phone]     Social Determinants of Health (SDOH) Interventions    Readmission Risk Interventions No flowsheet data found.

## 2020-02-22 NOTE — H&P (Signed)
Date: 02/22/2020               Patient Name:  Eric Ingram MRN: [id number]  DOB: [birth year] Age / Sex: 59 y.o., male   PCP: Earlene Plater, MD         Medical Service: Internal Medicine Teaching Service         Attending Physician: Dr. Velna Ochs, MD    First Contact: Charleen Kirks, MD, Albertine Grates Pager: IB [phone])  Second Contact: Eileen Stanford, MD, [place] Pager: OA [phone])       After Hours (After 5p/  First Contact Pager: [phone]  weekends / holidays): Second Contact Pager: [phone]   Chief Complaint: dyspnea  History of Present Illness: 59 y.o. yo male w/ PMH significant for HTN, atypical chest pain, ?HFpEF 2/2 uncontrolled HTN but not requiring diuretic therapy rec'd one time dose of IV lasix 2019. His history was obtained via interpreter, ED provider and his son who lives with him at home.  He Presents today with a 3-day history of poor taste, appetite and worsening cough and shortness of breath.  He denies any sick contacts he lives at home with his son who also denies any sick contacts or participating in any large gatherings.  He denies any nausea or vomiting, chest pain or other symptoms.  He reports that last night he became very short of breath to the point where he decided to come into the emergency department.  On arrival to the ED he was found to have an O2 saturation of 61% on room air, tachypneic.  He was started on dexamethasone and remdesivir and placed on nonrebreather.  He tells me since being oxygen he feels less short of breath and is now hungry asking to eat.     Meds:  Current Meds  Medication Sig  . diclofenac Sodium (VOLTAREN) 1 % GEL Apply 2 g topically 4 (four) times daily.  . diphenhydrAMINE-PE-APAP (THERAFLU EXPRESSMAX) 12.5-5-325 MG/15ML LIQD Take 15 mLs by mouth every 4 (four) hours as needed (flu symptoms).  . gabapentin (NEURONTIN) 300 MG capsule TAKE 2 CAPSULES BY MOUTH THREE TIMES DAILY. TAKE UP TO 2 CAPSULES IN THE MORNING, AFTERNOON, AND AT  BEDTIME (Patient taking differently: Take 600 mg by mouth 3 (three) times daily. )  . lisinopril (ZESTRIL) 40 MG tablet Take 1 tablet (40 mg total) by mouth daily.  . metoprolol succinate (TOPROL-XL) 25 MG 24 hr tablet Take 25 mg by mouth daily.  . pantoprazole (PROTONIX) 40 MG tablet Take 1 tablet (40 mg total) by mouth 2 (two) times daily.  Marland Kitchen Phenyleph-Doxylamine-DM-APAP (NYQUIL SEVERE COLD/FLU) 5-6.25-10-325 MG/15ML LIQD Take 30 mLs by mouth every 6 (six) hours as needed (cold).     Allergies: Allergies as of 02/22/2020  . (No Known Allergies)   Past Medical History:  Diagnosis Date  . Anemia   . CHF (congestive heart failure) ([place])   . Dental infection 08/02/2018  . GERD (gastroesophageal reflux disease)   . Hypertension   . Shingles rash 07/07/2019  . Thalassemia, alpha ([place])   . Viral gastritis 02/08/2019    Family History:  Family History  Problem Relation Age of Onset  . Colon cancer Neg Hx   . Stomach cancer Neg Hx      Social History:  Social History   Tobacco Use  . Smoking status: Former Smoker    Quit date: 12/15/1999    Years since quitting: 20.2  . Smokeless tobacco: Never Used  Substance Use Topics  . Alcohol  use: No    Comment: former  . Drug use: No     Review of Systems: A complete ROS was negative except as per HPI.   Physical Exam: Blood pressure 128/73, pulse (!) 112, temperature 98.7 F (37.1 C), temperature source Oral, resp. rate (!) 33, height 5\' 2"  (1.575 m), weight 74.8 kg, SpO2 (!) 86 %. Physical Exam Constitutional:      Appearance: He is not diaphoretic.  HENT:     Head: Normocephalic and atraumatic.  Eyes:     General: No scleral icterus.       Right eye: No discharge.        Left eye: No discharge.  Cardiovascular:     Rate and Rhythm: Regular rhythm. Tachycardia present.     Heart sounds: Normal heart sounds. No murmur. No friction rub. No gallop.      Comments: No lower extremity edema Pulmonary:     [place]: No respiratory  distress.     Breath sounds: Rales present. No wheezing.     Comments: Mild inspiratory wheeze appreciated bilaterally Abdominal:     General: Bowel sounds are normal. There is no distension.     Palpations: Abdomen is soft. There is no mass.     Tenderness: There is no abdominal tenderness. There is no guarding.  Musculoskeletal:     Right lower leg: No edema.     Left lower leg: No edema.  Neurological:     Mental Status: He is alert.     EKG: personally reviewed my interpretation is sinus tachycardia  CXR: personally reviewed my interpretation is patchy bilateral opacification seen consistent with severe viral pna likely 2/2 covid 19    Assessment & Plan by Problem: Active Problems:   Acute respiratory failure due to COVID-19 [facility])  Acute Respiratory failure due to Covid-19: classic s/s covid 19 starting 3 days prior, high supplemental O2 requirement and markedly elevated inflammatory markers and large amount of lung involvement on chest radiograph indicative of a pt with severe infection.  Currently stable on non rebreather but high risk for continued decompensation.  Started on remdesevir and dexamethasone.  -consulted PCCM for evaluation and consideration of advanced immunomodulatory agents -continue dexamethasone and remdesevir -combivent PRN -Try to transition to HFNC  -follow blood cx, repeat lactic acid likely elevated 2/2 hypoxemia and increased RR  HTN: on lisinopril at home   -hold lisinopril for now with increase in cr, poor oral intake, bp at baseline currently   Dispo: Admit patient to Inpatient with expected length of stay greater than 2 midnights.  Signed: Katherine Roan, MD 02/22/2020, 9:29 AM

## 2020-02-22 NOTE — ED Triage Notes (Signed)
Patient reports persistent dry cough with chest congestion / lost of taste and exertional dyspnea onset this week . O2 sat= 69% at triage ( room air) , denies fever or chills .

## 2020-02-22 NOTE — Consult Note (Signed)
Eric Ingram, MRN:  [id number], DOB:  May 05, 1961, LOS: 0 ADMISSION DATE:  02/22/2020, CONSULTATION DATE: 02/22/2020  REFERRING MD:  Dr. Philipp Ingram, CHIEF COMPLAINT:  SOB   Brief History   59 year old [place] M who presented 3/11 with a 5-day history of dry cough and loss of appetite.  Covid screening positive.  History of present illness   Translator utilized for interview.  Translator # [id number]   59 year old [place], never smoker, who presented 3/11 with a 5-day history of increasing dry cough, loss of appetite and shortness of breath.    The patient reports he does not work and has not for the last 4 years.  His children and wife live at home with him.  His children work.  No known sick exposures.  Family has not had any reports of illness.  He states he does not take medications on a daily basis and has no known health problems.  However, have a home medication list that includes antihypertensives.  He reports his cough has been largely nonproductive but occasionally does produce sputum.  He notes that his ribs and back are sore from coughing continuously and he has been unable to sleep.  He has had decreased appetite.  Currently he reports that he is tired from not sleeping due to cough and hungry.  He denies fevers.  Initial ER evaluation found him to be hypoxemic on room air to 68%.  This improved with nonrebreather.  Initial labs notable for BUN 14, creatinine 1.30, anion gap 14, alkaline phosphatase 178, albumin 3, AST 63, ALT 30, LDH 451, ferritin 838, CRP 23.2, lactic acid 2.3, WBC 17.7, Hgb 11.3, platelets 331, D-dimer 0.99, fibrinogen greater than 800.  Initial chest x-ray with bilateral infiltrates.    PCCM consulted for evaluation of hypoxemic respiratory failure in the setting of COVID-19.  Past Medical History  Alpha thalassemia Hypertension CHF Anemia GERD  Significant Hospital Events   3/11 Admit  Consults:  PCCM  Procedures:    Significant  Diagnostic Tests:    Micro Data:  COVID 3/11 >>  Antimicrobials / COVID Rx:  Decadron 3/11 >> Remdesivir 3/11 >>  Interim history/subjective:  RN reports patient remains on nonrebreather, in no acute distress.  Objective   Blood pressure 128/73, pulse (!) 112, temperature 98.7 F (37.1 C), temperature source Oral, resp. rate (!) 33, height 5\' 2"  (1.575 m), weight 74.8 kg, SpO2 (!) 86 %.        Intake/Output Summary (Last 24 hours) at 02/22/2020 0947 Last data filed at 02/22/2020 0754 Gross per 24 hour  Intake 100 ml  Output --  Net 100 ml   Filed Weights   02/22/20 0505 02/22/20 0506 02/22/20 0530  Weight: 68.5 kg 75 kg 74.8 kg    Examination: General: Thin adult male lying on ER stretcher in no acute distress HEENT: MM pink/moist, NRB in place, fair dentition, pupils=/reactive Neuro: Awake, alert, oriented/interactive with interpreter, MAE, normal strength CV: s1s2 RRR, ST on monitor, no m/r/g PULM: Mild tachypnea but nonlabored, able to speak full sentences with interpreter with no distress, breath sounds bilaterally clear anterior  GI: soft, bsx4 active  Extremities: warm/dry, no edema  Skin: no rashes or lesions   Resolved Hospital Problem list     Assessment & Plan:   Acute Hypoxemic Respiratory Failure secondary to COVID Viral Infection  -ok for floor admission, no indication for intubation at this time  -wean O2 for sats > 88%, will tolerate periods of hypoxemia  as long as he is not in distress or change in mental status  -pulmonary hygiene - mobilize  -prone positioning as tolerated  -continue remdesivir, decadron  -if declines or requires intubation, consider tocilizumab  HTN CHF -??reported hx but normal EF, no mention of diastolic dysfunction on last ECHO -per primary SVC    Best practice:  Diet: heart healthy  Pain/Anxiety/Delirium protocol (if indicated): n/a  VAP protocol (if indicated): n/a  DVT prophylaxis: per primary, COVID dosing per  pharmacy  GI prophylaxis: pepcid  Glucose control: n/a  Mobility: as tolerated, prone as able  Code Status: Full Code  Family Communication: Patient updated on plan of care Disposition: Floor, IMTS   Labs   CBC: Recent Labs  Lab 02/22/20 0517  WBC 17.7*  NEUTROABS 15.9*  HGB 11.3*  HCT 35.0*  MCV 67.8*  PLT [id number]    Basic Metabolic Panel: Recent Labs  Lab 02/22/20 0517  NA 137  K 3.9  CL 103  CO2 20*  GLUCOSE 135*  BUN 14  CREATININE 1.30*  CALCIUM 8.9   GFR: Estimated Creatinine Clearance: 54.9 mL/min (A) (by C-G formula based on SCr of 1.3 mg/dL (H)). Recent Labs  Lab 02/22/20 0517 02/22/20 0608 02/22/20 0612 02/22/20 0615  PROCALCITON  --   --  0.30  --   WBC 17.7*  --   --   --   LATICACIDVEN  --  2.3*  --  2.9*    Liver Function Tests: Recent Labs  Lab 02/22/20 0612  AST 63*  ALT 30  ALKPHOS 178*  BILITOT 1.1  PROT 8.1  ALBUMIN 3.0*   No results for input(s): LIPASE, AMYLASE in the last 168 hours. No results for input(s): AMMONIA in the last 168 hours.  ABG    Component Value Date/Time   TCO2 24 07/13/2015 0035     Coagulation Profile: No results for input(s): INR, PROTIME in the last 168 hours.  Cardiac Enzymes: No results for input(s): CKTOTAL, CKMB, CKMBINDEX, TROPONINI in the last 168 hours.  HbA1C: No results found for: HGBA1C  CBG: No results for input(s): GLUCAP in the last 168 hours.  Review of Systems: Positives in [place]   Gen: Denies fever, chills, weight change, fatigue, night sweats HEENT: Denies blurred vision, double vision, hearing loss, tinnitus, sinus congestion, rhinorrhea, sore throat, neck stiffness, dysphagia PULM: Denies shortness of breath, cough, sputum production, hemoptysis, wheezing CV: Denies chest pain, edema, orthopnea, paroxysmal nocturnal dyspnea, palpitations GI: Denies abdominal pain, abdominal soreness / back pain from coughing, nausea, vomiting, diarrhea, hematochezia, melena, constipation,  change in bowel habits GU: Denies dysuria, hematuria, polyuria, oliguria, urethral discharge Endocrine: Denies hot or cold intolerance, polyuria, polyphagia or appetite change Derm: Denies rash, dry skin, scaling or peeling skin change Heme: Denies easy bruising, bleeding, bleeding gums Neuro: Denies headache, numbness, weakness, slurred speech, loss of memory or consciousness   Past Medical History  He,  has a past medical history of Anemia, CHF (congestive heart failure) ([place]), Dental infection (08/02/2018), GERD (gastroesophageal reflux disease), Hypertension, Shingles rash (07/07/2019), Thalassemia, alpha ([place]), and Viral gastritis (02/08/2019).   Surgical History    Past Surgical History:  Procedure Laterality Date  . NO PAST SURGERIES       Social History   reports that he quit smoking about 20 years ago. He has never used smokeless tobacco. He reports that he does not drink alcohol or use drugs.   Family History   His family history is negative for Colon cancer and  Stomach cancer.   Allergies No Known Allergies   Home Medications  Prior to Admission medications   Medication Sig Start Date End Date Taking? Authorizing Provider  diclofenac Sodium (VOLTAREN) 1 % GEL Apply 2 g topically 4 (four) times daily. 02/09/20 03/10/20 Yes Al Decant, MD  diphenhydrAMINE-PE-APAP [facility]) 12.5-5-325 MG/15ML LIQD Take 15 mLs by mouth every 4 (four) hours as needed (flu symptoms).   Yes [provider]  gabapentin (NEURONTIN) 300 MG capsule TAKE 2 CAPSULES BY MOUTH THREE TIMES DAILY. TAKE UP TO 2 CAPSULES IN THE MORNING, AFTERNOON, AND AT BEDTIME Patient taking differently: Take 600 mg by mouth 3 (three) times daily.  12/22/19  Yes Axel Filler, MD  lisinopril (ZESTRIL) 40 MG tablet Take 1 tablet (40 mg total) by mouth daily. 02/09/20  Yes Al Decant, MD  metoprolol succinate (TOPROL-XL) 25 MG 24 hr tablet Take 25 mg by mouth daily. 02/02/20  Yes [provider]  pantoprazole (PROTONIX) 40 MG tablet Take 1 tablet (40 mg total) by mouth 2 (two) times daily. 02/09/20  Yes Al Decant, MD  Phenyleph-Doxylamine-DM-APAP (NYQUIL SEVERE COLD/FLU) 5-6.25-10-325 MG/15ML LIQD Take 30 mLs by mouth every 6 (six) hours as needed (cold).   Yes [provider]  amLODipine (NORVASC) 2.5 MG tablet Take 2.5 mg by mouth daily. 01/09/20   [provider]  fluticasone (FLONASE) 50 MCG/ACT nasal spray Place 2 sprays into both nostrils daily. 10/11/18 04/09/19  Kathi Ludwig, MD  naproxen (NAPROSYN) 500 MG tablet Take 1 tablet (500 mg total) by mouth 2 (two) times daily as needed. Patient not taking: Reported on 02/22/2020 02/09/20 02/08/21  Al Decant, MD  sucralfate (CARAFATE) 1 g tablet Take 1 tablet (1 g total) by mouth every 6 (six) hours as needed. Slowly dissolve 1 tablet in 1 tablespoon of water before taking Patient not taking: Reported on 02/22/2020 11/03/18   Lorella Nimrod, MD     Critical care time: n/a    Noe Gens, MSN, NP-C [facility] 02/22/2020, 10:55 AM   Please see [email or web address] for pager details.

## 2020-02-23 ENCOUNTER — Inpatient Hospital Stay (HOSPITAL_COMMUNITY): Payer: HRSA Program

## 2020-02-23 DIAGNOSIS — J1282 Pneumonia due to Coronavirus disease 2019: Secondary | ICD-10-CM

## 2020-02-23 DIAGNOSIS — J9601 Acute respiratory failure with hypoxia: Secondary | ICD-10-CM

## 2020-02-23 LAB — HIV ANTIBODY (ROUTINE TESTING W REFLEX): HIV Screen 4th Generation wRfx: NONREACTIVE

## 2020-02-23 LAB — PHOSPHORUS: Phosphorus: 3.4 mg/dL (ref 2.5–4.6)

## 2020-02-23 LAB — COMPREHENSIVE METABOLIC PANEL
ALT: 34 U/L (ref 0–44)
AST: 56 U/L — ABNORMAL HIGH (ref 15–41)
Albumin: 3 g/dL — ABNORMAL LOW (ref 3.5–5.0)
Alkaline Phosphatase: 209 U/L — ABNORMAL HIGH (ref 38–126)
Anion gap: 13 (ref 5–15)
BUN: 20 mg/dL (ref 6–20)
CO2: 22 mmol/L (ref 22–32)
Calcium: 9.3 mg/dL (ref 8.9–10.3)
Chloride: 102 mmol/L (ref 98–111)
Creatinine, Ser: 1.16 mg/dL (ref 0.61–1.24)
GFR calc Af Amer: >60 mL/min (ref >60)
GFR calc non Af Amer: >60 mL/min (ref >60)
Glucose, Bld: 111 mg/dL — ABNORMAL HIGH (ref 70–99)
Potassium: 4.2 mmol/L (ref 3.5–5.1)
Sodium: 137 mmol/L (ref 135–145)
Total Bilirubin: 0.8 mg/dL (ref 0.3–1.2)
Total Protein: 7.8 g/dL (ref 6.5–8.1)

## 2020-02-23 LAB — FERRITIN: Ferritin: 993 ng/mL — ABNORMAL HIGH (ref 24–336)

## 2020-02-23 LAB — MAGNESIUM: Magnesium: 2.1 mg/dL (ref 1.7–2.4)

## 2020-02-23 LAB — C-REACTIVE PROTEIN: CRP: 26.8 mg/dL — ABNORMAL HIGH (ref <1.0)

## 2020-02-23 LAB — ABO/RH: ABO/RH(D): A POS

## 2020-02-23 LAB — D-DIMER, QUANTITATIVE: D-Dimer, Quant: 0.83 ug/mL-FEU — ABNORMAL HIGH (ref 0.00–0.50)

## 2020-02-23 IMAGING — DX DG CHEST 1V PORT
1 series · 1 of 1 positions shown · non-contrast
Comparison: [DATE]

CLINICAL DATA: Acute respiratory failure with hypoxia.

EXAM:
PORTABLE CHEST 1 VIEW

[chest ap]
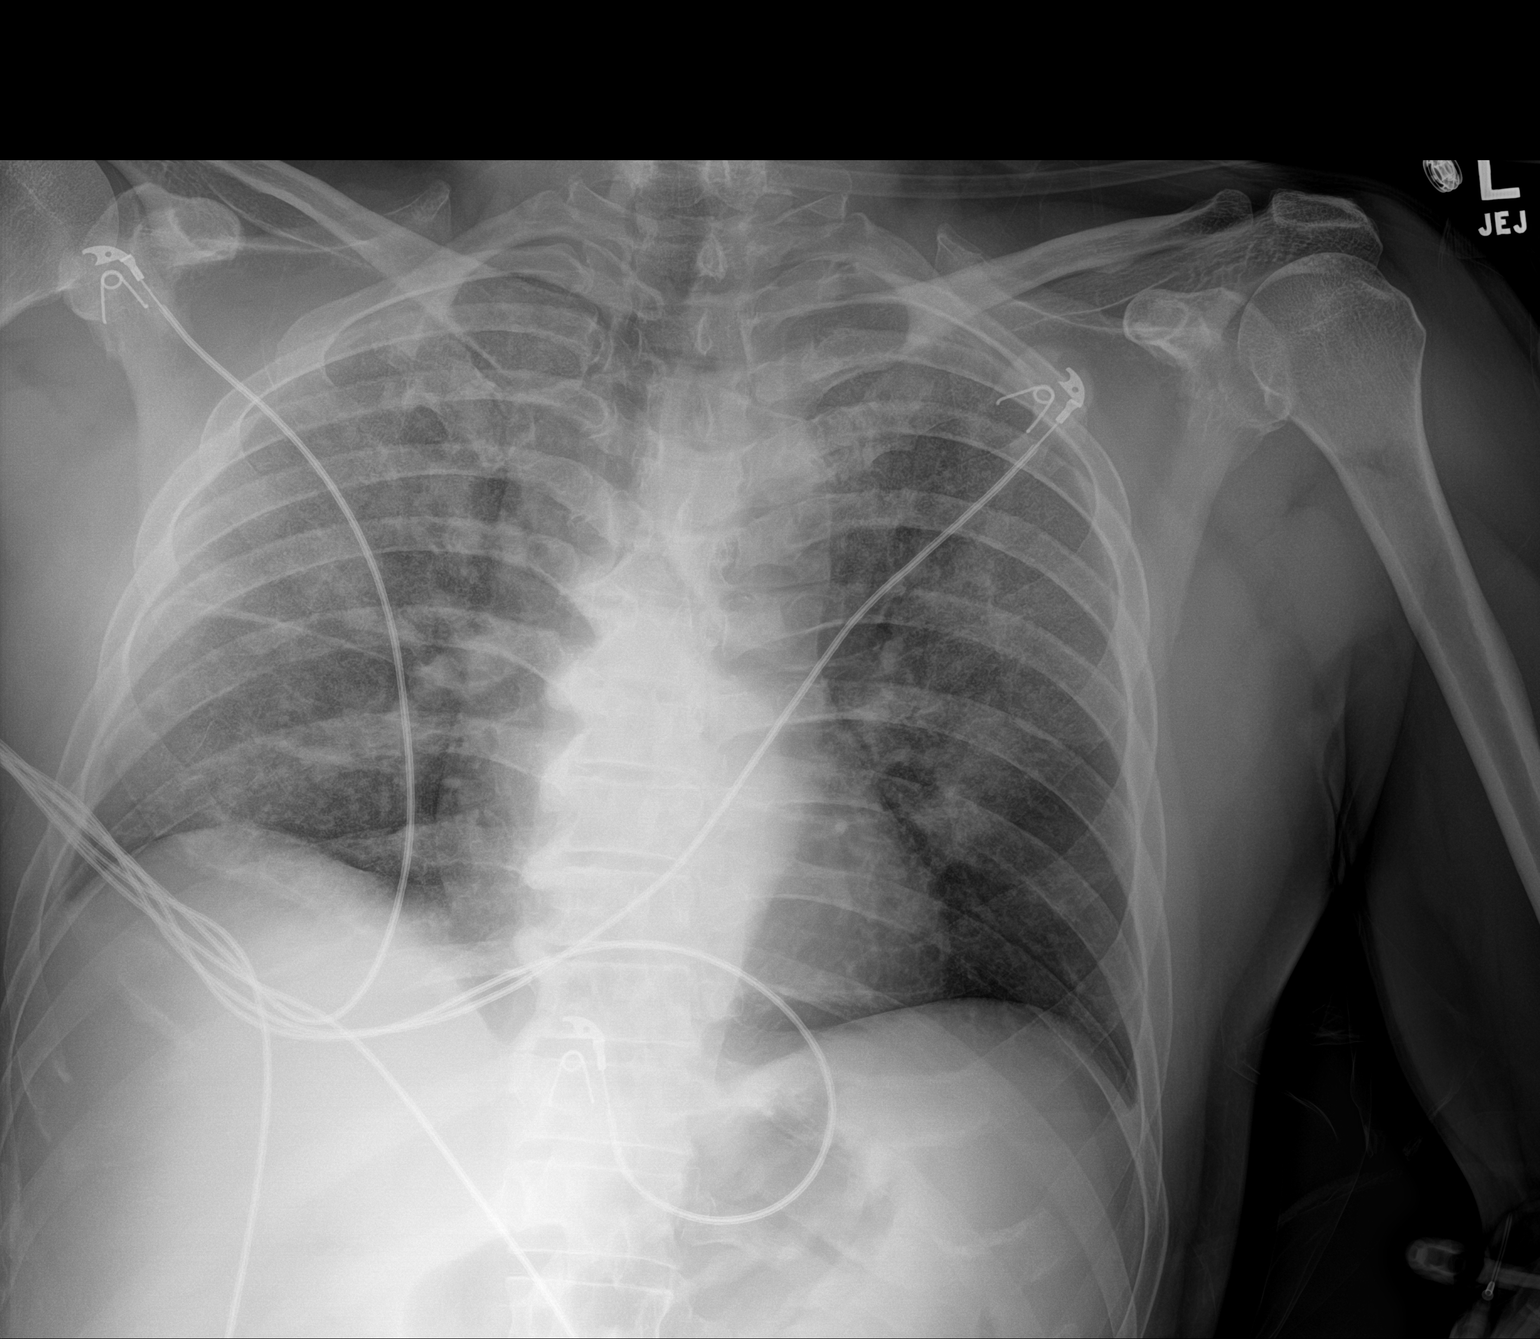

[1 of 1 positions shown; findings below may reference images not displayed]

FINDINGS: [UH] hours. The bilateral patchy relatively diffuse airspace disease
persists in a similar fashion, right greater than left. The
cardiopericardial silhouette is within normal limits for size. The
visualized bony structures of the thorax are intact. Telemetry leads
overlie the chest.
IMPRESSION: Stable.  Patchy bilateral airspace disease, right greater than left.

## 2020-02-23 MED ORDER — RAMELTEON 8 MG PO TABS
8.0000 mg | ORAL_TABLET | Freq: Every day | ORAL | Status: DC
  Administered 2020-02-23 – 2020-02-25 (×3): 8 mg via ORAL
  Filled 2020-02-23 (×5): qty 1

## 2020-02-23 MED ORDER — AMLODIPINE BESYLATE 2.5 MG PO TABS
2.5000 mg | ORAL_TABLET | Freq: Every day | ORAL | Status: DC
  Administered 2020-02-23 – 2020-02-26 (×4): 2.5 mg via ORAL
  Filled 2020-02-23 (×4): qty 1

## 2020-02-23 MED ORDER — IPRATROPIUM-ALBUTEROL 20-100 MCG/ACT IN AERS
1.0000 | INHALATION_SPRAY | Freq: Four times a day (QID) | RESPIRATORY_TRACT | Status: DC
  Administered 2020-02-23 – 2020-02-26 (×13): 1 via RESPIRATORY_TRACT

## 2020-02-23 MED ORDER — BENZONATATE 100 MG PO CAPS
100.0000 mg | ORAL_CAPSULE | Freq: Three times a day (TID) | ORAL | Status: DC
  Administered 2020-02-23 – 2020-02-26 (×11): 100 mg via ORAL
  Filled 2020-02-23 (×11): qty 1

## 2020-02-23 MED ORDER — LABETALOL HCL 5 MG/ML IV SOLN: 5.0000 mg | Freq: Four times a day (QID) | INTRAVENOUS | Status: DC | PRN

## 2020-02-23 MED ORDER — ONDANSETRON HCL 4 MG/2ML IJ SOLN
4.0000 mg | Freq: Four times a day (QID) | INTRAMUSCULAR | Status: DC | PRN
  Administered 2020-02-23: 4 mg via INTRAVENOUS
  Filled 2020-02-23: qty 2

## 2020-02-23 NOTE — Progress Notes (Signed)
NAMEKalynn Ingram, MRN:  [id number], DOB:  1961-02-23, LOS: 1 ADMISSION DATE:  02/22/2020, CONSULTATION DATE: 02/22/2020  REFERRING MD:  Dr. Philipp Ovens, CHIEF COMPLAINT:  SOB   Brief History   59 year old Eric Ingram who presented 3/11 with a 5-day history of dry cough and loss of appetite.  Covid screening positive.  History of present illness   Translator utilized for interview.  Translator # [id number]   59 year old Eric Ingram, never smoker, who presented 3/11 with a 5-day history of increasing dry cough, loss of appetite and shortness of breath.    The patient reports he does not work and has not for the last 4 years.  His children and wife live at home with him.  His children work.  No known sick exposures.  Family has not had any reports of illness.  He states he does not take medications on a daily basis and has no known health problems.  However, have a home medication list that includes antihypertensives.  He reports his cough has been largely nonproductive but occasionally does produce sputum.  He notes that his ribs and back are sore from coughing continuously and he has been unable to sleep.  He has had decreased appetite.  Currently he reports that he is tired from not sleeping due to cough and hungry.  He denies fevers.  Initial ER evaluation found him to be hypoxemic on room air to 68%.  This improved with nonrebreather.  Initial labs notable for BUN 14, creatinine 1.30, anion gap 14, alkaline phosphatase 178, albumin 3, AST 63, ALT 30, LDH 451, ferritin 838, CRP 23.2, lactic acid 2.3, WBC 17.7, Hgb 11.3, platelets 331, D-dimer 0.99, fibrinogen greater than 800.  Initial chest x-ray with bilateral infiltrates.    PCCM consulted for evaluation of hypoxemic respiratory failure in the setting of COVID-19.  Past Medical History  Alpha thalassemia Hypertension CHF Anemia GERD  Significant Hospital Events   3/11 Admit  Consults:  PCCM  Procedures:    Significant  Diagnostic Tests:    Micro Data:  COVID 3/11 >>  Antimicrobials / COVID Rx:  Decadron 3/11 >> Remdesivir 3/11 >>  Interim history/subjective:  He remains on 1.00 NRB mask, states he feels better, more comfortable, less coughing and shortness of breath  Objective   Blood pressure (!) 132/93, pulse 95, temperature 97.8 F (36.6 C), temperature source Axillary, resp. rate (!) 29, height 5\' 2"  (1.575 Ingram), weight 74.8 kg, SpO2 90 %.        Intake/Output Summary (Last 24 hours) at 02/23/2020 1524 Last data filed at 02/23/2020 0854 Gross per 24 hour  Intake 120 ml  Output 725 ml  Net -605 ml   Filed Weights   02/22/20 0505 02/22/20 0506 02/22/20 0530  Weight: 68.5 kg 75 kg 74.8 kg    Examination: General: Thin man, laying in bed comfortably with 100% mask in place HEENT: Oropharynx clear, pupils react Neuro: Awake, alert, able to communicate with some broken English, no focal deficits noted CV: Regular, distant, no murmur PULM: No tachypnea today, no excess work of breathing or accessory muscle use.  Few scattered bilateral inspiratory crackles, no wheeze GI: Nondistended, positive bowel sounds Extremities: No edema Skin: No rash  Chest x-ray from 3/12 reviewed by me, shows stable bilateral patchy interstitial infiltrates, right greater than left, no progression compared with prior.  Resolved Hospital Problem list     Assessment & Plan:   Acute Hypoxemic Respiratory Failure secondary to COVID Viral Infection  Stable on 1.00 NRB.  SPO2 in the high 90s and work of breathing, respiratory pattern are improved.  Consider weaning to high flow nasal cannula or standard nasal cannula, goal SPO2 > 88%.  Follow intermittent chest x-ray.  Agree with remdesivir and dexamethasone as planned.   Please call if we can assist further  Best practice:  Diet: heart healthy  Pain/Anxiety/Delirium protocol (if indicated): n/a  VAP protocol (if indicated): n/a  DVT prophylaxis: per primary,  COVID dosing per pharmacy  GI prophylaxis: pepcid  Glucose control: n/a  Mobility: as tolerated, prone as able  Code Status: Full Code  Family Communication: Patient updated on plan of care Disposition: Floor, IMTS   Labs   CBC: Recent Labs  Lab 02/22/20 0517  WBC 17.7*  NEUTROABS 15.9*  HGB 11.3*  HCT 35.0*  MCV 67.8*  PLT [id number]    Basic Metabolic Panel: Recent Labs  Lab 02/22/20 0517 02/23/20 0718  NA 137 137  K 3.9 4.2  CL 103 102  CO2 20* 22  GLUCOSE 135* 111*  BUN 14 20  CREATININE 1.30* 1.16  CALCIUM 8.9 9.3  MG  --  2.1  PHOS  --  3.4   GFR: Estimated Creatinine Clearance: 61.6 mL/min (by C-G formula based on SCr of 1.16 mg/dL). Recent Labs  Lab 02/22/20 0517 02/22/20 0608 02/22/20 0612 02/22/20 0615 02/22/20 1416  PROCALCITON  --   --  0.30  --   --   WBC 17.7*  --   --   --   --   LATICACIDVEN  --  2.3*  --  2.9* 2.0*    Liver Function Tests: Recent Labs  Lab 02/22/20 0612 02/23/20 0718  AST 63* 56*  ALT 30 34  ALKPHOS 178* 209*  BILITOT 1.1 0.8  PROT 8.1 7.8  ALBUMIN 3.0* 3.0*    Baltazar Apo, MD, PhD 02/23/2020, 3:25 PM [facility] [phone] or if no answer [phone]

## 2020-02-23 NOTE — Progress Notes (Signed)
Spoke with patients, updated her on the plan of care. Will continue to monitor.

## 2020-02-23 NOTE — Progress Notes (Addendum)
Pt with a MEWS score firing red. Provider from internal medicine was notified of pt having HR-109, R-24, o2-98% on NR, T-97.8. pt vomited x2. VS to now be taken q15 x4, q30, q1h, then q4h. Will continue to monitor pt.Pt also placed on NPO. Informed pt via interpretor of new diet changes. Will continue to monitor pt.

## 2020-02-23 NOTE — Progress Notes (Signed)
   Subjective: HD#1   Overnight: No acute overnight events reported.  Today, Eric Ingram was examined today using an interpreter.  He endorses posttussive emesis and nausea.  In regards to his dyspnea, he states that it is stable and has not worsened since admission.  Objective:  Vital signs in last 24 hours: Vitals:   02/23/20 0828 02/23/20 1000 02/23/20 1002 02/23/20 1008  BP: (!) 124/91   (!) 147/89  Pulse: (!) 111   (!) 109  Resp: (!) 28 (!) 24  18  Temp: 98.7 F (37.1 C)   97.8 F (36.6 C)  TempSrc: Oral   Axillary  SpO2: (!) 85%  100% 100%  Weight:      Height:       Const: In no apparent distress, lying comfortably in bed, conversational HEENT: Nonrebreather max in place Resp: Diffuse rales, SPO2 90% on 15 L nonrebreather at rest but decreases to 70s-80s when sitting upright and with movement CV: Tachycardic, no murmurs, gallop, rub  Assessment/Plan:  Active Problems:   Acute respiratory failure due to COVID-19 [facility])  Eric Ingram is a very pleasant 59 year old [place] speaking gentleman with medical history significant for hypertension here for evaluation of acute hypoxic respiratory failure secondary to COVID-19 pneumonia  Acute hypoxic respiratory failure secondary to COVID-19 pneumonia: He continues to remain on 15 L high flow nasal cannula.  At rest he maintains oxygen saturation in the low 90s however with exertion, oxygen saturation decreases to the 70s-80s.  He endorses posttussive emesis and nausea which is likely a sequelae of COVID-19 pneumonia. -Continue remdesivir per pharmacy (Day 2) -Continue dexamethasone (Day 2) -Supplemental oxygen as needed -Prone as tolerated -Tessalon Perles    Elevated transaminases: Mildly elevated AST which is improving.  63>> 56. -Continue to monitor closely as he is on remdesivir   Hypertension: BPs have remained in the 120/140s/80s-90s. -Resume home amlodipine -Labetalol as needed SBP > 180, DBP > 120.  Hold for  HR<65   FEN: NPO, Heart Healthy when able to transition off NRB VTE ppx: Subcutaneous Lovenox CODE STATUS: Full code  Prior to Admission Living Arrangement: Home Anticipated Discharge Location: Home Barriers to Discharge: Ongoing medical therapy Dispo: Anticipated discharge in approximately 3-5 day(s).    Jean Rosenthal, MD 02/23/2020, 10:37 AM Pager: [phone] Internal Medicine Teaching Service

## 2020-02-24 LAB — CBC WITH DIFFERENTIAL/PLATELET
Abs Immature Granulocytes: 0.19 10*3/uL — ABNORMAL HIGH (ref 0.00–0.07)
Basophils Absolute: 0 10*3/uL (ref 0.0–0.1)
Basophils Relative: 0 %
Eosinophils Absolute: 0.4 10*3/uL (ref 0.0–0.5)
Eosinophils Relative: 2 %
HCT: 34.3 % — ABNORMAL LOW (ref 39.0–52.0)
Hemoglobin: 11.4 g/dL — ABNORMAL LOW (ref 13.0–17.0)
Immature Granulocytes: 1 %
Lymphocytes Relative: 5 %
Lymphs Abs: 0.9 10*3/uL (ref 0.7–4.0)
MCH: 21.8 pg — ABNORMAL LOW (ref 26.0–34.0)
MCHC: 33.2 g/dL (ref 30.0–36.0)
MCV: 65.7 fL — ABNORMAL LOW (ref 80.0–100.0)
Monocytes Absolute: 0.7 10*3/uL (ref 0.1–1.0)
Monocytes Relative: 4 %
Neutro Abs: 17.3 10*3/uL — ABNORMAL HIGH (ref 1.7–7.7)
Neutrophils Relative %: 88 %
Platelets: 487 10*3/uL — ABNORMAL HIGH (ref 150–400)
RBC: 5.22 MIL/uL (ref 4.22–5.81)
RDW: 14.3 % (ref 11.5–15.5)
WBC: 19.4 10*3/uL — ABNORMAL HIGH (ref 4.0–10.5)
nRBC: 0 % (ref 0.0–0.2)

## 2020-02-24 LAB — COMPREHENSIVE METABOLIC PANEL
ALT: 31 U/L (ref 0–44)
AST: 40 U/L (ref 15–41)
Albumin: 2.9 g/dL — ABNORMAL LOW (ref 3.5–5.0)
Alkaline Phosphatase: 200 U/L — ABNORMAL HIGH (ref 38–126)
Anion gap: 13 (ref 5–15)
BUN: 25 mg/dL — ABNORMAL HIGH (ref 6–20)
CO2: 23 mmol/L (ref 22–32)
Calcium: 9.1 mg/dL (ref 8.9–10.3)
Chloride: 99 mmol/L (ref 98–111)
Creatinine, Ser: 1.15 mg/dL (ref 0.61–1.24)
GFR calc Af Amer: >60 mL/min (ref >60)
GFR calc non Af Amer: >60 mL/min (ref >60)
Glucose, Bld: 137 mg/dL — ABNORMAL HIGH (ref 70–99)
Potassium: 5.1 mmol/L (ref 3.5–5.1)
Sodium: 135 mmol/L (ref 135–145)
Total Bilirubin: 0.6 mg/dL (ref 0.3–1.2)
Total Protein: 7.7 g/dL (ref 6.5–8.1)

## 2020-02-24 LAB — C-REACTIVE PROTEIN: CRP: 19.7 mg/dL — ABNORMAL HIGH (ref <1.0)

## 2020-02-24 LAB — D-DIMER, QUANTITATIVE: D-Dimer, Quant: 0.76 ug/mL-FEU — ABNORMAL HIGH (ref 0.00–0.50)

## 2020-02-24 MED ORDER — GUAIFENESIN ER 600 MG PO TB12
600.0000 mg | ORAL_TABLET | Freq: Two times a day (BID) | ORAL | Status: DC
  Administered 2020-02-24 – 2020-02-26 (×5): 600 mg via ORAL
  Filled 2020-02-24 (×5): qty 1

## 2020-02-24 MED ORDER — GUAIFENESIN-DM 100-10 MG/5ML PO SYRP
10.0000 mL | ORAL_SOLUTION | ORAL | Status: DC
  Administered 2020-02-24 – 2020-02-25 (×5): 10 mL via ORAL
  Filled 2020-02-24 (×5): qty 10

## 2020-02-24 NOTE — Progress Notes (Signed)
   Subjective: HD#2   Overnight: No acute overnight events reported  Today, Eric Ingram was examined using the [place] interpreter.  Overall he states his shortness of breath has improved but he still continues to endorse cough with phlegm production as well as rhinorrhea.  We were able to update his family at bedside with interpreter.  Objective:  Vital signs in last 24 hours: Vitals:   02/24/20 0022 02/24/20 0322 02/24/20 0400 02/24/20 0555  BP: 119/85 117/72 123/80   Pulse: 79 (!) 101 94   Resp: (!) 21 (!) 34    Temp: 97.7 F (36.5 C) 97.6 F (36.4 C)    TempSrc:      SpO2: 95% 97% 93% 95%  Weight:      Height:       Const: In no apparent distress, lying comfortably in bed HEENT: Nasal cannula in place Resp: Bilateral rales appreciated, on 7 L high flow nasal cannula CV: Tachycardic, no murmurs, gallop, rub  Assessment/Plan:  Active Problems:   Pneumonia due to COVID-19 virus   Acute respiratory failure with hypoxia [facility])  Mr. Enoch is a very pleasant 59 year old [place] speaking gentleman with medical history significant for hypertension here for evaluation of acute hypoxic respiratory failure secondary to COVID-19 pneumonia  Acute hypoxic respiratory failure secondary to COVID-19 pneumonia: He states that his shortness of breath has improved but continues to endorse cough, rhinorrhea and sputum production.  We have scheduled Tessalon Perles and Robitussin since he might not be asking for them on an as-needed basis.  His CRP is improving though leukocytosis persists. -Continue remdesivir per pharmacy (Day 3) -Continue dexamethasone (Day 3) -Supplemental oxygen as needed -Prone as tolerated -Tessalon, Robitussin   Elevated transaminases:  Improved -Continue to monitor closely as he is on remdesivir   Hypertension: -Amlodipine 2.5 mg daily -Labetalol as needed SBP > 180, DBP > 120.  Hold for HR<65   FEN:  Heart healthy VTE ppx: Subcutaneous  Lovenox CODE STATUS: Full code  Prior to Admission Living Arrangement: Home Anticipated Discharge Location: Home Barriers to Discharge: Ongoing medical therapy Dispo: Anticipated discharge in approximately 3-5 day(s).     Jean Rosenthal, MD 02/24/2020, 6:44 AM Pager: [phone] Internal Medicine Teaching Service

## 2020-02-25 ENCOUNTER — Encounter (HOSPITAL_COMMUNITY): Payer: Self-pay | Admitting: Internal Medicine

## 2020-02-25 DIAGNOSIS — U071 COVID-19: Principal | ICD-10-CM

## 2020-02-25 DIAGNOSIS — J1282 Pneumonia due to coronavirus disease 2019: Secondary | ICD-10-CM

## 2020-02-25 LAB — COMPREHENSIVE METABOLIC PANEL
ALT: 41 U/L (ref 0–44)
AST: 58 U/L — ABNORMAL HIGH (ref 15–41)
Albumin: 2.8 g/dL — ABNORMAL LOW (ref 3.5–5.0)
Alkaline Phosphatase: 183 U/L — ABNORMAL HIGH (ref 38–126)
Anion gap: 13 (ref 5–15)
BUN: 33 mg/dL — ABNORMAL HIGH (ref 6–20)
CO2: 22 mmol/L (ref 22–32)
Calcium: 8.6 mg/dL — ABNORMAL LOW (ref 8.9–10.3)
Chloride: 98 mmol/L (ref 98–111)
Creatinine, Ser: 1.22 mg/dL (ref 0.61–1.24)
GFR calc Af Amer: >60 mL/min (ref >60)
GFR calc non Af Amer: >60 mL/min (ref >60)
Glucose, Bld: 107 mg/dL — ABNORMAL HIGH (ref 70–99)
Potassium: 4.5 mmol/L (ref 3.5–5.1)
Sodium: 133 mmol/L — ABNORMAL LOW (ref 135–145)
Total Bilirubin: 0.9 mg/dL (ref 0.3–1.2)
Total Protein: 7.6 g/dL (ref 6.5–8.1)

## 2020-02-25 LAB — CBC WITH DIFFERENTIAL/PLATELET
Abs Immature Granulocytes: 0 10*3/uL (ref 0.00–0.07)
Basophils Absolute: 0 10*3/uL (ref 0.0–0.1)
Basophils Relative: 0 %
Eosinophils Absolute: 0 10*3/uL (ref 0.0–0.5)
Eosinophils Relative: 0 %
HCT: 32.2 % — ABNORMAL LOW (ref 39.0–52.0)
Hemoglobin: 10.5 g/dL — ABNORMAL LOW (ref 13.0–17.0)
Lymphocytes Relative: 8 %
Lymphs Abs: 1.6 10*3/uL (ref 0.7–4.0)
MCH: 21.8 pg — ABNORMAL LOW (ref 26.0–34.0)
MCHC: 32.6 g/dL (ref 30.0–36.0)
MCV: 66.8 fL — ABNORMAL LOW (ref 80.0–100.0)
Monocytes Absolute: 1.2 10*3/uL — ABNORMAL HIGH (ref 0.1–1.0)
Monocytes Relative: 6 %
Neutro Abs: 17.4 10*3/uL — ABNORMAL HIGH (ref 1.7–7.7)
Neutrophils Relative %: 86 %
Platelets: 509 10*3/uL — ABNORMAL HIGH (ref 150–400)
RBC: 4.82 MIL/uL (ref 4.22–5.81)
RDW: 14.4 % (ref 11.5–15.5)
WBC: 20.2 10*3/uL — ABNORMAL HIGH (ref 4.0–10.5)
nRBC: 0.1 % (ref 0.0–0.2)
nRBC: 1 /100 WBC — ABNORMAL HIGH

## 2020-02-25 LAB — C-REACTIVE PROTEIN: CRP: 8 mg/dL — ABNORMAL HIGH (ref <1.0)

## 2020-02-25 LAB — D-DIMER, QUANTITATIVE: D-Dimer, Quant: 0.94 ug/mL-FEU — ABNORMAL HIGH (ref 0.00–0.50)

## 2020-02-25 MED ORDER — GUAIFENESIN-DM 100-10 MG/5ML PO SYRP: 15.0000 mL | ORAL_SOLUTION | ORAL | Status: DC

## 2020-02-25 MED ORDER — POLYETHYLENE GLYCOL 3350 17 G PO PACK
17.0000 g | PACK | Freq: Every day | ORAL | Status: DC
  Administered 2020-02-25 – 2020-02-26 (×2): 17 g via ORAL
  Filled 2020-02-25 (×2): qty 1

## 2020-02-25 MED ORDER — GUAIFENESIN-DM 100-10 MG/5ML PO SYRP
15.0000 mL | ORAL_SOLUTION | Freq: Four times a day (QID) | ORAL | Status: DC
  Administered 2020-02-25: 5 mL via ORAL
  Administered 2020-02-25 – 2020-02-26 (×4): 15 mL via ORAL
  Filled 2020-02-25 (×5): qty 15

## 2020-02-25 NOTE — Progress Notes (Signed)
   Subjective: HD#3   Overnight: No events reported  Today, Eric Ingram was examined using a [place] interpreter. He states that his dyspnea has improved but still endorses cough with sputum production  Objective:  Vital signs in last 24 hours: Vitals:   02/24/20 2355 02/25/20 0037 02/25/20 0410 02/25/20 0630  BP: 117/74     Pulse:  80    Resp:  20 (!) 22 20  Temp: 98.2 F (36.8 C)     TempSrc: Oral     SpO2:  96% 99% 94%  Weight:      Height:       Const: In no apparent distress HEENT: [place] in place Resp: Minimal rales appreciated. Improved from prior  CV: RRR, no murmurs, gallop, rub   Assessment/Plan:  Active Problems:   Pneumonia due to COVID-19 virus   Acute respiratory failure with hypoxia [facility])  Eric Ingram a very pleasant 61 year oldMontagnardspeaking gentleman with medical history significant for hypertension here for evaluation of acute hypoxic respiratory failure secondary to COVID-19 pneumonia  Acute hypoxic respiratory failure secondary to COVID-19 pneumonia: SOB has improved significantly. Now on 3L [place] with SpO2 of 98%. He still has a cough with sputum production. Remains afebrile. Reactive leukocytosis from steroids.  -Continue remdesivir per pharmacy (Day 4) -Continue dexamethasone(Day 4) -Supplemental oxygen as needed -Prone as tolerated -Tessalon, Robitussin, Mucinex   Elevated transaminases: Stable -Continue to monitor closely as he is on remdesivir   Hypertension: -Amlodipine 2.5 mg daily -Labetalol as neededSBP >180, DBP >120. Hold for HR<65   FEN: Heart healthy VTE ppx:Subcutaneous Lovenox CODE STATUS:Full code  Prior to Admission Living Arrangement:Home Anticipated Discharge Location:Home Barriers to [place] medical therapy Dispo: Anticipated discharge in approximately3-5day(s).     Jean Rosenthal, MD 02/25/2020, 6:49 AM Pager: [phone] Internal Medicine Teaching Service

## 2020-02-25 NOTE — Plan of Care (Signed)

## 2020-02-26 LAB — COMPREHENSIVE METABOLIC PANEL
ALT: 46 U/L — ABNORMAL HIGH (ref 0–44)
AST: 47 U/L — ABNORMAL HIGH (ref 15–41)
Albumin: 2.9 g/dL — ABNORMAL LOW (ref 3.5–5.0)
Alkaline Phosphatase: 179 U/L — ABNORMAL HIGH (ref 38–126)
Anion gap: 13 (ref 5–15)
BUN: 32 mg/dL — ABNORMAL HIGH (ref 6–20)
CO2: 22 mmol/L (ref 22–32)
Calcium: 8.8 mg/dL — ABNORMAL LOW (ref 8.9–10.3)
Chloride: 98 mmol/L (ref 98–111)
Creatinine, Ser: 1.23 mg/dL (ref 0.61–1.24)
GFR calc Af Amer: >60 mL/min (ref >60)
GFR calc non Af Amer: >60 mL/min (ref >60)
Glucose, Bld: 111 mg/dL — ABNORMAL HIGH (ref 70–99)
Potassium: 4.3 mmol/L (ref 3.5–5.1)
Sodium: 133 mmol/L — ABNORMAL LOW (ref 135–145)
Total Bilirubin: 0.9 mg/dL (ref 0.3–1.2)
Total Protein: 8 g/dL (ref 6.5–8.1)

## 2020-02-26 LAB — CBC WITH DIFFERENTIAL/PLATELET
Abs Immature Granulocytes: 0.58 10*3/uL — ABNORMAL HIGH (ref 0.00–0.07)
Basophils Absolute: 0.1 10*3/uL (ref 0.0–0.1)
Basophils Relative: 0 %
Eosinophils Absolute: 0 10*3/uL (ref 0.0–0.5)
Eosinophils Relative: 0 %
HCT: 33.7 % — ABNORMAL LOW (ref 39.0–52.0)
Hemoglobin: 11.2 g/dL — ABNORMAL LOW (ref 13.0–17.0)
Immature Granulocytes: 3 %
Lymphocytes Relative: 9 %
Lymphs Abs: 1.7 10*3/uL (ref 0.7–4.0)
MCH: 22.1 pg — ABNORMAL LOW (ref 26.0–34.0)
MCHC: 33.2 g/dL (ref 30.0–36.0)
MCV: 66.6 fL — ABNORMAL LOW (ref 80.0–100.0)
Monocytes Absolute: 1.8 10*3/uL — ABNORMAL HIGH (ref 0.1–1.0)
Monocytes Relative: 9 %
Neutro Abs: 15.1 10*3/uL — ABNORMAL HIGH (ref 1.7–7.7)
Neutrophils Relative %: 79 %
Platelets: 580 10*3/uL — ABNORMAL HIGH (ref 150–400)
RBC: 5.06 MIL/uL (ref 4.22–5.81)
RDW: 14.5 % (ref 11.5–15.5)
WBC: 19.3 10*3/uL — ABNORMAL HIGH (ref 4.0–10.5)
nRBC: 0.2 % (ref 0.0–0.2)

## 2020-02-26 LAB — C-REACTIVE PROTEIN: CRP: 7.4 mg/dL — ABNORMAL HIGH (ref <1.0)

## 2020-02-26 LAB — D-DIMER, QUANTITATIVE: D-Dimer, Quant: 1.4 ug/mL-FEU — ABNORMAL HIGH (ref 0.00–0.50)

## 2020-02-26 MED ORDER — DEXAMETHASONE 0.5 MG PO TABS: 6.0000 mg | ORAL_TABLET | Freq: Every day | ORAL | 0 refills | Status: AC

## 2020-02-26 MED ORDER — GUAIFENESIN-DM 100-10 MG/5ML PO SYRP: 15.0000 mL | ORAL_SOLUTION | Freq: Four times a day (QID) | ORAL | 0 refills | Status: DC

## 2020-02-26 MED ORDER — LACTATED RINGERS IV BOLUS
1000.0000 mL | Freq: Once | INTRAVENOUS | Status: AC
  Administered 2020-02-26: 1000 mL via INTRAVENOUS

## 2020-02-26 MED ORDER — BENZONATATE 100 MG PO CAPS: 100.0000 mg | ORAL_CAPSULE | Freq: Three times a day (TID) | ORAL | 0 refills | Status: DC

## 2020-02-26 MED ORDER — LACTATED RINGERS IV SOLN: INTRAVENOUS | Status: AC

## 2020-02-26 NOTE — Progress Notes (Signed)
SATURATION QUALIFICATIONS: (This note is used to comply with regulatory documentation for home oxygen)  Patient Saturations on Room Air at Rest = 94%  Patient Saturations on Room Air while Ambulating = 87%  Patient Saturations on 2 Liters of oxygen while Ambulating = 90%  Please briefly explain why patient needs home oxygen: Pt requires supplemental O2 to maintain sats above 88% while ambulating

## 2020-02-26 NOTE — Progress Notes (Addendum)
   Subjective: HD#4   Overnight: No acute overnight events reported  Today, Eric Ingram is doing well and was examined using a [place] interpreter.  He states that he is doing well in regards to his shortness of breath.  He also reports that his cough has improved  Objective:  Vital signs in last 24 hours: Vitals:   02/26/20 0450 02/26/20 0541 02/26/20 0551 02/26/20 0700  BP: (!) 146/74     Pulse: (!) 114  (!) 104 98  Resp: (!) 22 (!) 21  (!) 23  Temp:      TempSrc:      SpO2: 91%   95%  Weight:      Height:       Const: In no apparent distress HEENT: Nasal cannula in place Resp: CTA BL, no wheezes, crackles, rhonchi.  SPO2 at 90% on ambient air, will decrease to 89% when he sits up. CV: RRR, no murmurs, gallop, rub  Assessment/Plan:  Active Problems:   Pneumonia due to COVID-19 virus   Acute respiratory failure with hypoxia [facility])  Eric Ingram a very pleasant 25 year old[place]agnardspeaking Ingram with medical history significant for hypertension here for evaluation of acute hypoxic respiratory failure secondary to COVID-19 pneumonia   Acute hypoxic respiratory failure secondary to COVID-19 pneumonia:He had an episode of tachycardia to the 110s but has resolved. His CRP is improving 7.4<<8<<19.  This morning, his oxygen saturation was at 90% on room air while at rest and decreased to 89% when he sat up.  We made him aware that he would be discharged today on supplemental oxygen.  He has been advised to quarantine for 5 additional days after discharge. -Continue remdesivir per pharmacy (Day5/5) -Continue dexamethasone(Day5/10) -Supplemental oxygen as needed -Prone as tolerated -Tessalon, Robitussin, Mucinex -Will ambulate today with Pulse Ox -Incentive spirometer as needed  -Stable to discharge today on supplemental oxygen    Elevated transaminases:Stable -Continue to monitor closely as he is on remdesivir   Hypertension: -Amlodipine2.5 mg  daily -Labetalol as neededSBP >180, DBP >120. Hold for HR<65   [id number] healthy VTE ppx:Subcutaneous Lovenox CODE STATUS:Full code  Prior to Admission Living Arrangement:Home Anticipated Discharge Location:Home Barriers to [facility] medical therapy Dispo: Anticipated discharge in approximately3-5day(s).   Jean Rosenthal, MD 02/26/2020, 10:04 AM Pager: [phone] Internal Medicine Teaching Service

## 2020-02-26 NOTE — Progress Notes (Signed)
Pt given discharge instructions, prescriptions, and care notes. Pt verbalized understanding AEB no further questions or concerns at this time. IV was discontinued, no redness, pain, or swelling noted at this time. Telemetry discontinued and Centralized Telemetry was notified. Pt left the floor via wheelchair with staff in stable condition.  Also spoke with Pt's daughter and reviewed AVS via phone. Reminded of prescriptions at [place] & followup with PCP. Also informed that Pt needs O2 when ambulating. No further questions or concerns.

## 2020-02-26 NOTE — Discharge Summary (Signed)
Name: Eric Ingram MRN: [id number] DOB: [birth year] 59 y.o. PCP: Earlene Plater, MD  Date of Admission: 02/22/2020  5:04 AM Date of Discharge: 3/15/20213/15/2020 Attending Physician: No att. providers found  Discharge Diagnosis: 1.  Acute hypoxic respiratory failure secondary to COVID-19 pneumonia 2.  Elevated transaminases secondary to COVID-19 infection  Discharge Medications: Allergies as of 02/26/2020   No Known Allergies     Medication List    STOP taking these medications   Theraflu ExpressMax 12.5-5-325 MG/15ML Liqd Generic drug: diphenhydrAMINE-PE-APAP     TAKE these medications   amLODipine 2.5 MG tablet Commonly known as: NORVASC Take 2.5 mg by mouth daily.   benzonatate 100 MG capsule Commonly known as: TESSALON Take 1 capsule (100 mg total) by mouth 3 (three) times daily.   dexamethasone 0.5 MG tablet Commonly known as: Decadron Take 12 tablets (6 mg total) by mouth daily for 5 days.   diclofenac Sodium 1 % Gel Commonly known as: Voltaren Apply 2 g topically 4 (four) times daily.   fluticasone 50 MCG/ACT nasal spray Commonly known as: FLONASE Place 2 sprays into both nostrils daily.   gabapentin 300 MG capsule Commonly known as: NEURONTIN TAKE 2 CAPSULES BY MOUTH THREE TIMES DAILY. TAKE UP TO 2 CAPSULES IN THE MORNING, AFTERNOON, AND AT BEDTIME What changed: See the new instructions.   guaiFENesin-dextromethorphan 100-10 MG/5ML syrup Commonly known as: ROBITUSSIN DM Take 15 mLs by mouth every 6 (six) hours for 15 days.   lisinopril 40 MG tablet Commonly known as: ZESTRIL Take 1 tablet (40 mg total) by mouth daily.   metoprolol succinate 25 MG 24 hr tablet Commonly known as: TOPROL-XL Take 25 mg by mouth daily.   naproxen 500 MG tablet Commonly known as: Naprosyn Take 1 tablet (500 mg total) by mouth 2 (two) times daily as needed.   NyQuil Severe Cold/Flu 5-6.25-10-325 MG/15ML Liqd Generic drug: Phenyleph-Doxylamine-DM-APAP Take 30 mLs  by mouth every 6 (six) hours as needed (cold).   pantoprazole 40 MG tablet Commonly known as: PROTONIX Take 1 tablet (40 mg total) by mouth 2 (two) times daily.   sucralfate 1 g tablet Commonly known as: Carafate Take 1 tablet (1 g total) by mouth every 6 (six) hours as needed. Slowly dissolve 1 tablet in 1 tablespoon of water before taking            Durable Medical Equipment  (From admission, onward)         Start     Ordered   02/26/20 1445  For home use only DME oxygen  Once    Comments: Pt requires 2L [place] with exertion no oxygen required at rest  Question Answer Comment  Length of Need 6 Months   Mode or (Route) Nasal cannula   Liters per Minute 2   Frequency Continuous (stationary and portable oxygen unit needed)   Oxygen delivery system Gas      02/26/20 1445   02/26/20 0000  For home use only DME oxygen    Comments: Pt requires 2L [place] oxygen with ambulation  Question Answer Comment  Length of Need 6 Months   Mode or (Route) Mask   Liters per Minute 2   Oxygen delivery system Gas      02/26/20 1331          Disposition and follow-up:   Eric Ingram was [facility] from [facility] in Stable condition.  At the hospital follow up visit please address:  1.  Acute hypoxic respiratory failure secondary  to COVID-19 pneumonia, elevated transaminases: He will complete an additional 5-day course of dexamethasone 6 mg.  He will be discharged on supplemental oxygen.  2.  Labs / imaging needed at time of follow-up: LFTs  3.  Pending labs/ test needing follow-up: None  Follow-up Appointments: Follow-up [place]. Go on 03/01/2020.   Why: Appointment at [facility].  Follow the signs to the [place] information: [place], by [place]       [place] Follow up.   Why: home oxygen          Hospital Course by problem list: 1.  Acute  hypoxic respiratory failure secondary to COVID-19 pneumonia, elevated LFTs: Eric Ingram  is a 59 year old [place] speaking gentleman with medical history significant for hypertension who presented to [facility] with parageusia, dysgeusia, poor appetite, cough and shortness of breath.  He was found to be hypoxic to 61% on room air after testing positive for COVID-19.  He initially required high flow nasal cannula and nonrebreather as means of supplemental oxygen.  He was started on remdesivir and dexamethasone.  His clinical course was remarkable as he required less and less supplemental oxygen.  He will be discharged today on supplemental oxygen and complete an additional 5-day course of dexamethasone 5 mg daily.  Discharge Vitals:   BP 114/76 (BP Location: Right Arm)   Pulse (!) 104   Temp 98.3 F (36.8 C) (Oral)   Resp 20   Ht 5\' 2"  (1.575 m)   Wt 74.8 kg   SpO2 93%   BMI 30.18 kg/m   Pertinent Labs, Studies, and Procedures:  CMP Latest Ref Rng & Units 02/26/2020 02/25/2020 02/24/2020  Glucose 70 - 99 mg/dL 111(H) 107(H) 137(H)  BUN 6 - 20 mg/dL 32(H) 33(H) 25(H)  Creatinine 0.61 - 1.24 mg/dL 1.23 1.22 1.15  Sodium 135 - 145 mmol/L 133(L) 133(L) 135  Potassium 3.5 - 5.1 mmol/L 4.3 4.5 5.1  Chloride 98 - 111 mmol/L 98 98 99  CO2 22 - 32 mmol/L 22 22 23   Calcium 8.9 - 10.3 mg/dL 8.8(L) 8.6(L) 9.1  Total Protein 6.5 - 8.1 g/dL 8.0 7.6 7.7  Total Bilirubin 0.3 - 1.2 mg/dL 0.9 0.9 0.6  Alkaline Phos 38 - 126 U/L 179(H) 183(H) 200(H)  AST 15 - 41 U/L 47(H) 58(H) 40  ALT 0 - 44 U/L 46(H) 41 31    Discharge Instructions: Discharge Instructions    Diet - low sodium heart healthy   Complete by: As directed    Discharge instructions   Complete by: As directed    Eric Ingram,   It was a pleasure taking care of you here in the hospital.  You were admitted because of a lung infection from coronavirus.  We treated you with the virus medicine and steroids.  Going forward, you will take  the steroid medicine for 5 additional days.  Please quarantine for another 5 days.  We are sending you home with oxygen.  With Covid, the trouble with breathing can linger around for several weeks.  Please follow-up with your primary doctor.  Take Care!   For home use only DME oxygen   Complete by: As directed    Pt requires 2L [place] oxygen with ambulation   Length of Need: 6 Months   Mode or (Route): Mask   Liters per Minute: 2   Oxygen delivery system: Gas   Increase activity slowly  Complete by: As directed       Signed: Jean Rosenthal, MD 02/26/2020, 9:07 PM   Pager: [phone] Internal Medicine Teaching Service

## 2020-02-26 NOTE — TOC Transition Note (Signed)
Transition of Care [facility]) - CM/SW Discharge Note   Patient Details  Name: Eric Ingram MRN: [id number] Date of Birth: [birth year]  [facility] CM/SW Contact:  Maryclare Labrador, RN Phone Number: 02/26/2020, 2:06 PM   Clinical Narrative:   Pt deemed appropriate for discharge home today.  CM attempted to speak with pt directly however interpretor services was unable to locate a resource that could assist with the [place] language.  Per bedside nurse  - pt has a limited understanding of English - CM advised to call daughter.  CM contacted daughter at phone listed in [place].  CM was able to speak with daughter [name] and pt's wife with the assistance of his daughter.  Daughter [name] speaks english and informed CM that pt has an orange card and denied hardship associated with medication copays.  Daughter also informed pt that family will be with pt at discharge, pts other daughter [name] will transport him home at discharge.  Daughter in agreement for home oxygen via charity, pt's daughter will provide a credit card per [employer] charity requirements.  Daughter to call unit when home equipment is delivered - bedside nurse aware that pt needs to remain in house until home equipment is confirmed.  [employer] will provide portable tanks to unit for transport home.  No other CM needs determined - CM signing off    Final next level of care: Home/Self Care Barriers to Discharge: Continued Medical Work up   Patient Goals and CMS Choice   CMS Medicare.gov Compare Post Acute Care list provided to:: Other (Comment Required) Choice offered to / list presented to : Adult Children(Daughter [name])  Discharge Placement                       Discharge Plan and Services                DME Arranged: Oxygen DME Agency: [employer] Date DME Agency Contacted: 02/26/20 Time DME Agency Contacted: [phone] Representative spoke with at DME Agency: [place] ([place])  Interventions     Readmission Risk Interventions No flowsheet data found.

## 2020-02-27 LAB — CULTURE, BLOOD (ROUTINE X 2)
Culture: NO GROWTH
Culture: NO GROWTH
Special Requests: ADEQUATE

## 2020-03-01 ENCOUNTER — Ambulatory Visit: Payer: Self-pay

## 2020-03-04 ENCOUNTER — Emergency Department (HOSPITAL_COMMUNITY): Payer: Self-pay

## 2020-03-04 ENCOUNTER — Emergency Department (HOSPITAL_COMMUNITY)
Admission: EM | Admit: 2020-03-04 | Discharge: 2020-03-04 | Disposition: A | Discharge status: Home / Self Care | Payer: Self-pay | Attending: Emergency Medicine | Admitting: Emergency Medicine

## 2020-03-04 ENCOUNTER — Encounter (HOSPITAL_COMMUNITY): Payer: Self-pay | Admitting: Emergency Medicine

## 2020-03-04 DIAGNOSIS — Z87891 Personal history of nicotine dependence: Secondary | ICD-10-CM | POA: Insufficient documentation

## 2020-03-04 DIAGNOSIS — I110 Hypertensive heart disease with heart failure: Secondary | ICD-10-CM | POA: Insufficient documentation

## 2020-03-04 DIAGNOSIS — Z8616 Personal history of COVID-19: Secondary | ICD-10-CM | POA: Insufficient documentation

## 2020-03-04 DIAGNOSIS — R1013 Epigastric pain: Secondary | ICD-10-CM | POA: Insufficient documentation

## 2020-03-04 DIAGNOSIS — K5900 Constipation, unspecified: Secondary | ICD-10-CM | POA: Insufficient documentation

## 2020-03-04 DIAGNOSIS — Z79899 Other long term (current) drug therapy: Secondary | ICD-10-CM | POA: Insufficient documentation

## 2020-03-04 DIAGNOSIS — R630 Anorexia: Secondary | ICD-10-CM | POA: Insufficient documentation

## 2020-03-04 DIAGNOSIS — I5032 Chronic diastolic (congestive) heart failure: Secondary | ICD-10-CM | POA: Insufficient documentation

## 2020-03-04 DIAGNOSIS — R110 Nausea: Secondary | ICD-10-CM | POA: Insufficient documentation

## 2020-03-04 LAB — COMPREHENSIVE METABOLIC PANEL
ALT: 84 U/L — ABNORMAL HIGH (ref 0–44)
AST: 42 U/L — ABNORMAL HIGH (ref 15–41)
Albumin: 2.5 g/dL — ABNORMAL LOW (ref 3.5–5.0)
Alkaline Phosphatase: 167 U/L — ABNORMAL HIGH (ref 38–126)
Anion gap: 10 (ref 5–15)
BUN: 19 mg/dL (ref 6–20)
CO2: 22 mmol/L (ref 22–32)
Calcium: 8.5 mg/dL — ABNORMAL LOW (ref 8.9–10.3)
Chloride: 102 mmol/L (ref 98–111)
Creatinine, Ser: 1.03 mg/dL (ref 0.61–1.24)
GFR calc Af Amer: >60 mL/min (ref >60)
GFR calc non Af Amer: >60 mL/min (ref >60)
Glucose, Bld: 91 mg/dL (ref 70–99)
Potassium: 4.5 mmol/L (ref 3.5–5.1)
Sodium: 134 mmol/L — ABNORMAL LOW (ref 135–145)
Total Bilirubin: 0.8 mg/dL (ref 0.3–1.2)
Total Protein: 7.8 g/dL (ref 6.5–8.1)

## 2020-03-04 LAB — CBC WITH DIFFERENTIAL/PLATELET
Abs Immature Granulocytes: 0.15 10*3/uL — ABNORMAL HIGH (ref 0.00–0.07)
Basophils Absolute: 0 10*3/uL (ref 0.0–0.1)
Basophils Relative: 0 %
Eosinophils Absolute: 0.1 10*3/uL (ref 0.0–0.5)
Eosinophils Relative: 1 %
HCT: 34.2 % — ABNORMAL LOW (ref 39.0–52.0)
Hemoglobin: 10.9 g/dL — ABNORMAL LOW (ref 13.0–17.0)
Immature Granulocytes: 1 %
Lymphocytes Relative: 11 %
Lymphs Abs: 1.8 10*3/uL (ref 0.7–4.0)
MCH: 21.9 pg — ABNORMAL LOW (ref 26.0–34.0)
MCHC: 31.9 g/dL (ref 30.0–36.0)
MCV: 68.8 fL — ABNORMAL LOW (ref 80.0–100.0)
Monocytes Absolute: 1.1 10*3/uL — ABNORMAL HIGH (ref 0.1–1.0)
Monocytes Relative: 7 %
Neutro Abs: 13.5 10*3/uL — ABNORMAL HIGH (ref 1.7–7.7)
Neutrophils Relative %: 80 %
Platelets: 698 10*3/uL — ABNORMAL HIGH (ref 150–400)
RBC: 4.97 MIL/uL (ref 4.22–5.81)
RDW: 16 % — ABNORMAL HIGH (ref 11.5–15.5)
WBC: 16.7 10*3/uL — ABNORMAL HIGH (ref 4.0–10.5)
nRBC: 0.1 % (ref 0.0–0.2)

## 2020-03-04 LAB — LIPASE, BLOOD: Lipase: 49 U/L (ref 11–51)

## 2020-03-04 IMAGING — US US ABDOMEN LIMITED
1 series · 14 of 25 positions shown · non-contrast
Comparison: CT abdomen pelvis [DATE].

CLINICAL DATA: Epigastric pain and nausea.

EXAM:
ULTRASOUND ABDOMEN LIMITED RIGHT UPPER QUADRANT

[Series 1: us abdomen limited · 14 of 27 slices shown]
[im 1/27]
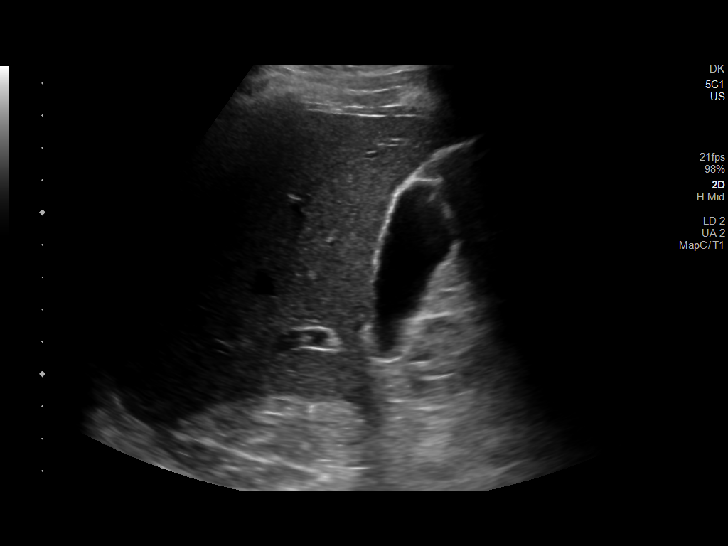
[im 3/27]
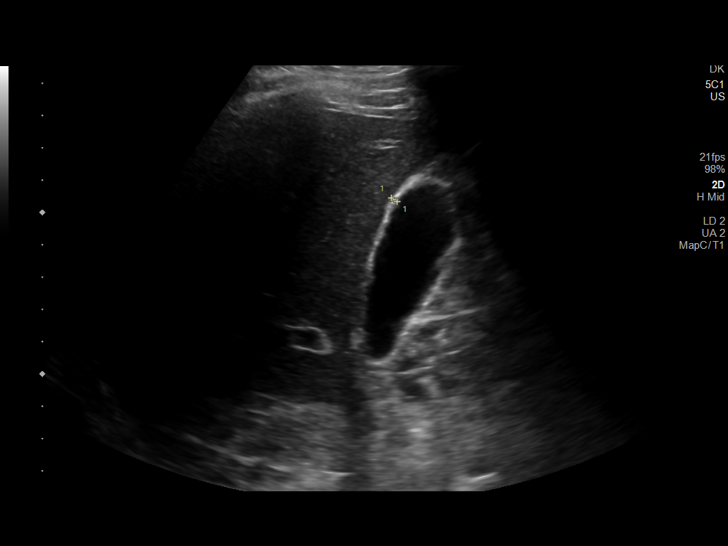
[im 5/27]
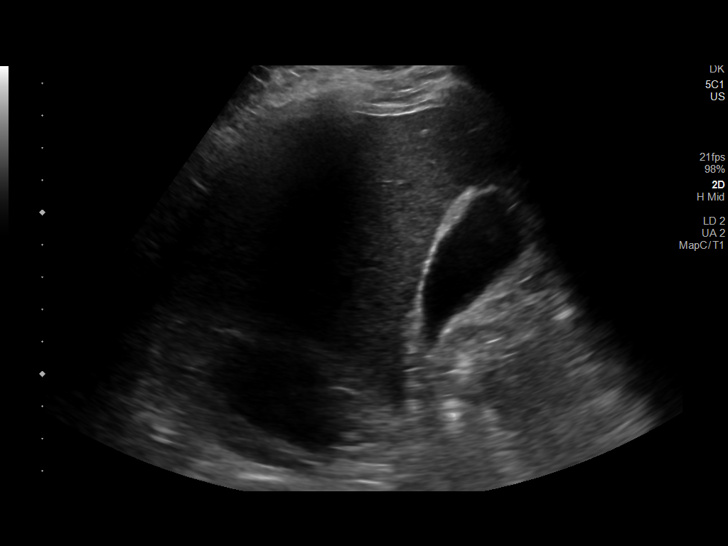
[im 7/27]
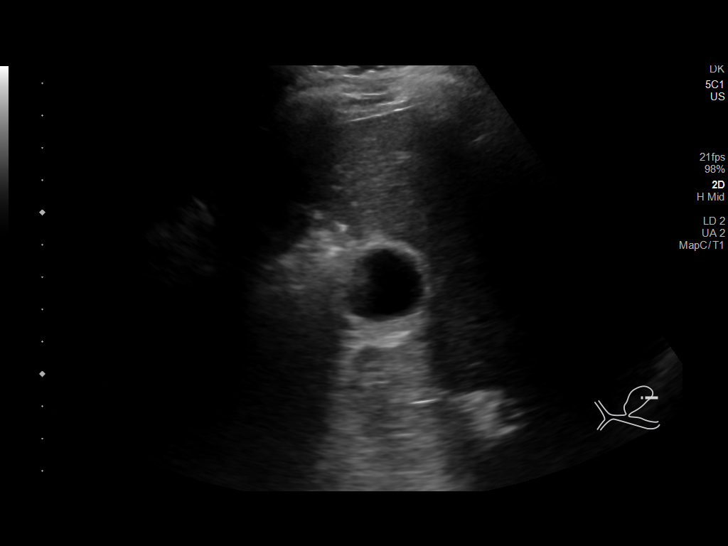
[im 9/27]
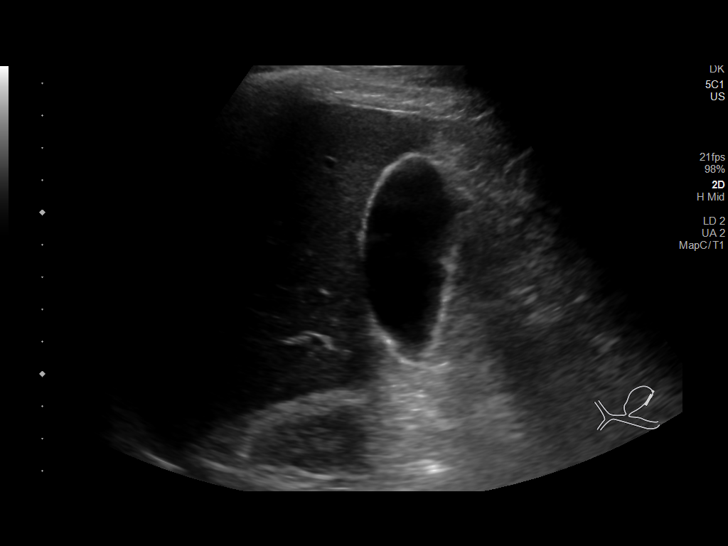
[im 10/27]
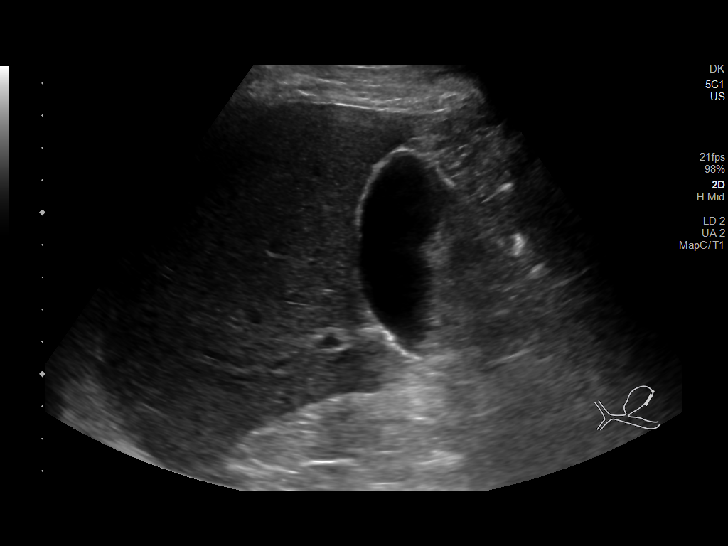
[im 12/27]
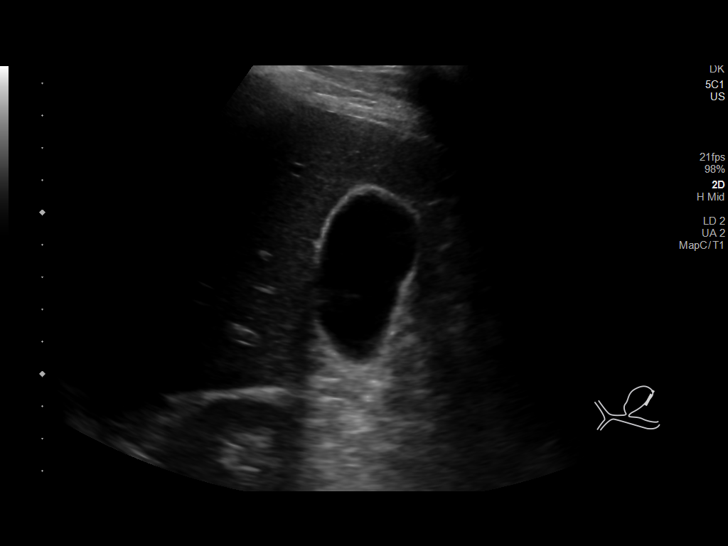
[im 15/27]
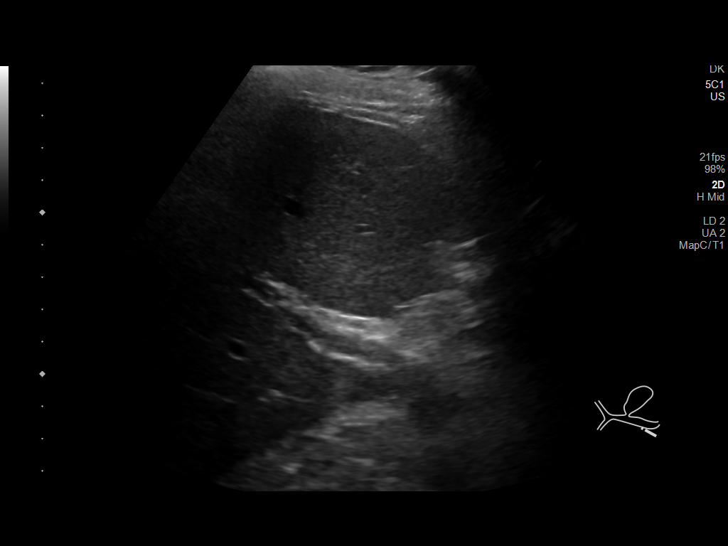
[im 17/27]
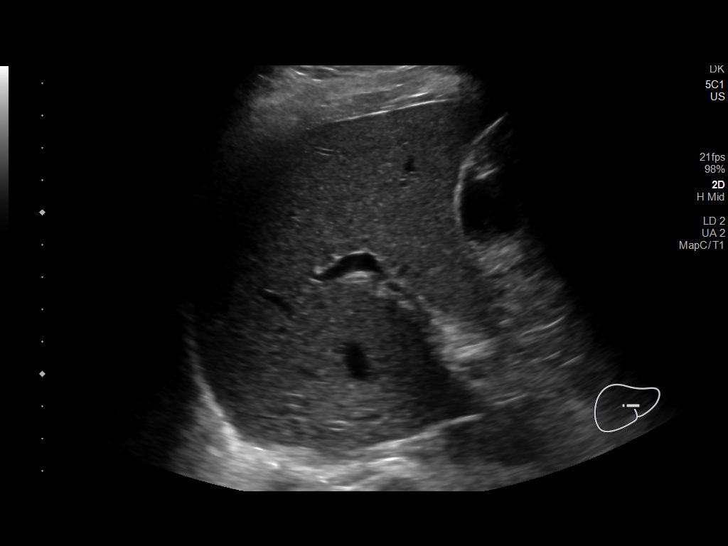
[im 18/27]
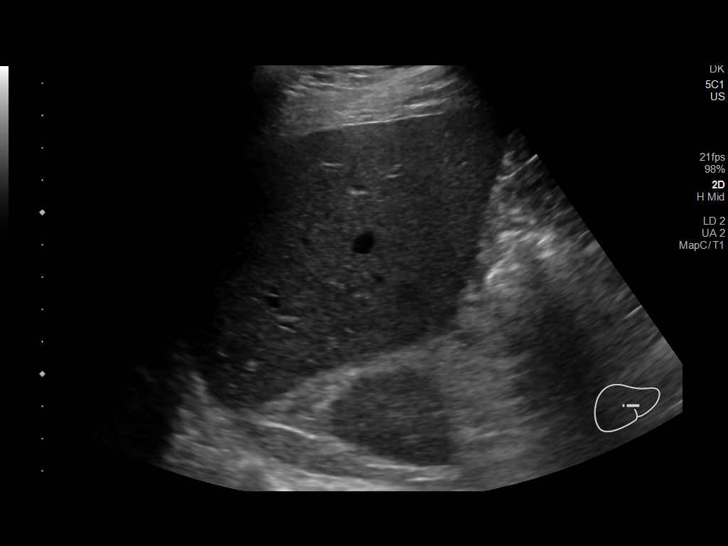
[im 20/27]
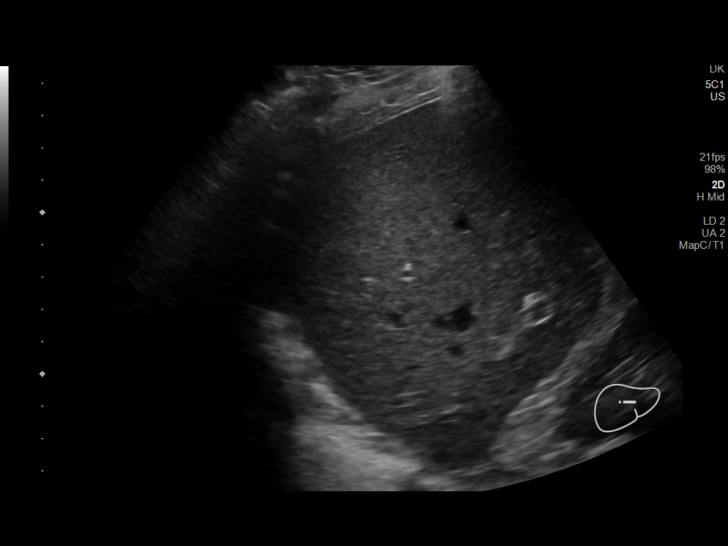
[im 22/27]
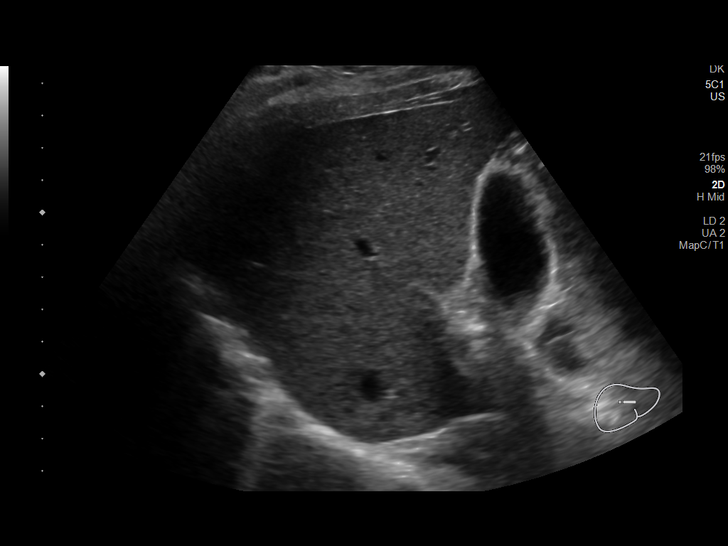
[im 24/27]
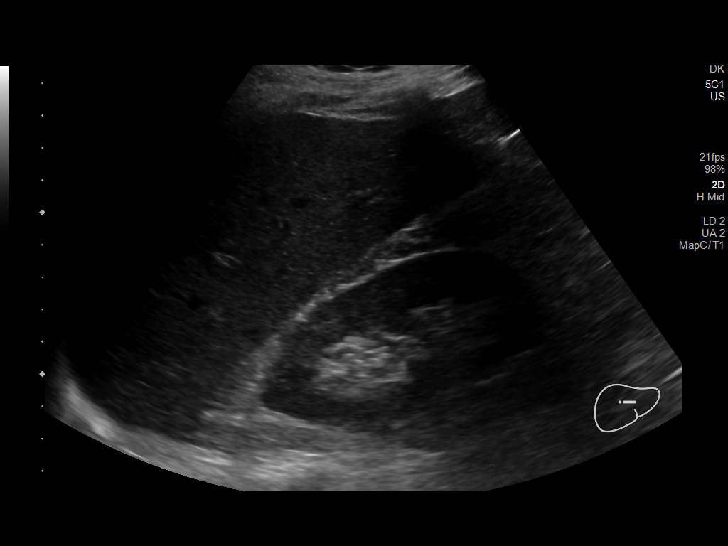
[im 27/27]
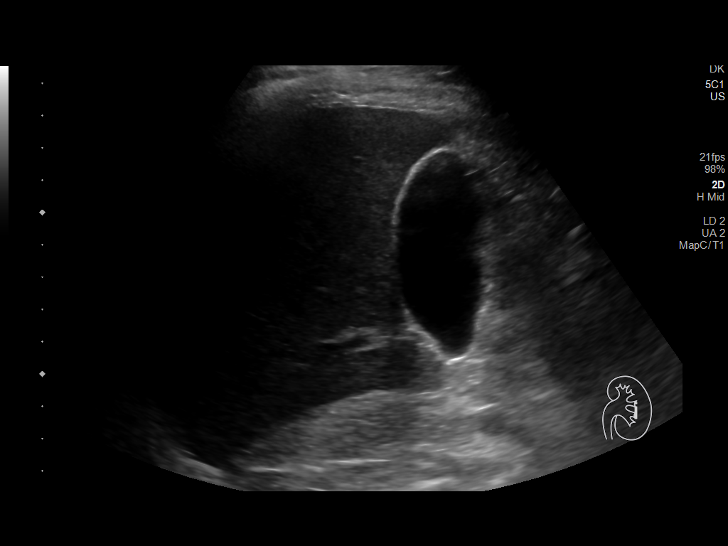

[14 of 25 positions shown; findings below may reference images not displayed]

FINDINGS: Gallbladder:

No gallstones or wall thickening visualized. No sonographic Murphy
sign noted by sonographer.

Common bile duct:

Diameter: 3 mm, within normal limits.

Liver:

No focal lesion identified. Within normal limits in parenchymal
echogenicity. Portal vein is patent on color Doppler imaging with
normal direction of blood flow towards the liver.

Other: None.
IMPRESSION: Normal exam

## 2020-03-04 IMAGING — DX DG CHEST 1V PORT
1 series · 1 of 1 positions shown · non-contrast
Comparison: Chest x-ray [DATE]

CLINICAL DATA: Epigastric pain and nausea. History of COVID
pneumonia

EXAM:
PORTABLE CHEST 1 VIEW

[chest]
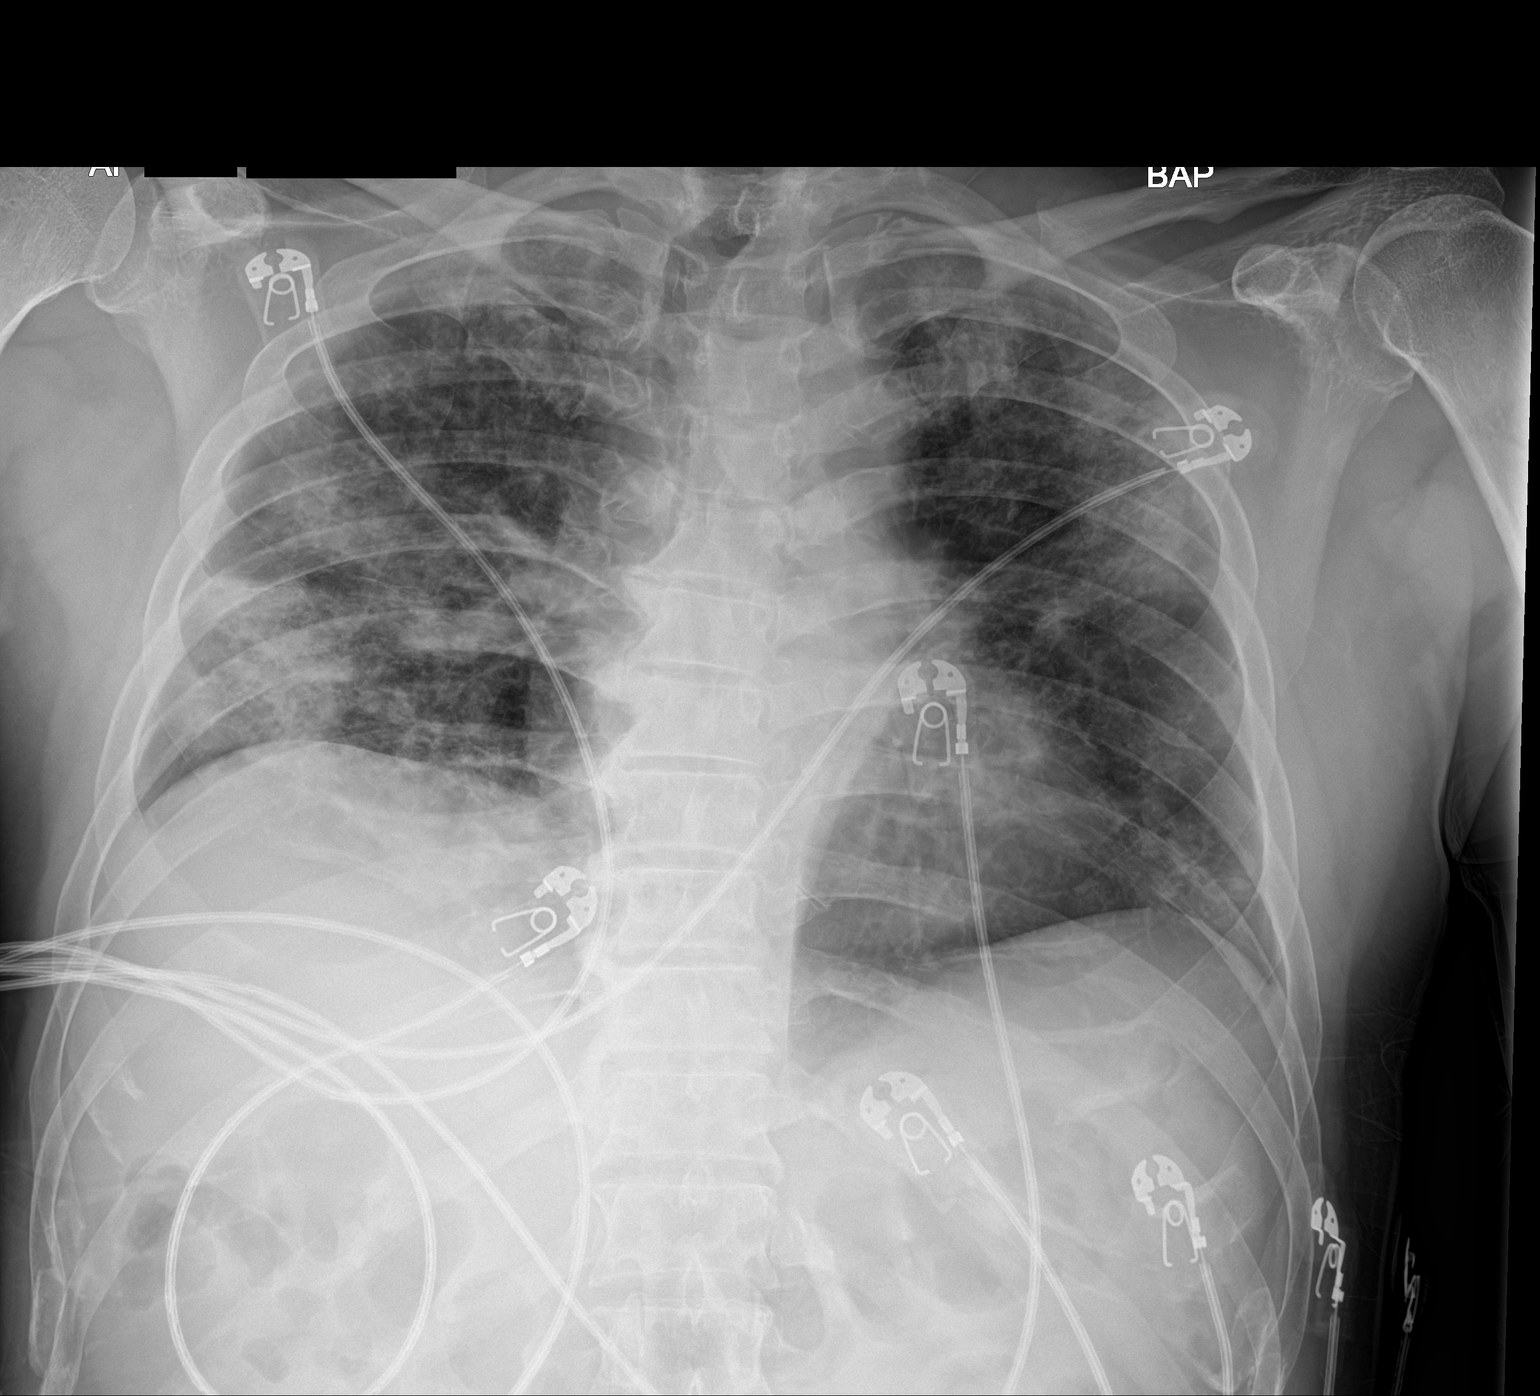

[1 of 1 positions shown; findings below may reference images not displayed]

FINDINGS: The cardiac silhouette, mediastinal and hilar contours are within
normal limits and stable.

Persistent patchy bilateral lung infiltrates with slightly lower
lung volumes and slightly progressive areas of airspace
consolidation. No pleural effusions or pneumothorax.
IMPRESSION: Persistent patchy bilateral lung infiltrates.

## 2020-03-04 MED ORDER — SODIUM CHLORIDE 0.9 % IV BOLUS
500.0000 mL | Freq: Once | INTRAVENOUS | Status: AC
  Administered 2020-03-04: 500 mL via INTRAVENOUS

## 2020-03-04 MED ORDER — LIDOCAINE VISCOUS HCL 2 % MT SOLN
15.0000 mL | Freq: Once | OROMUCOSAL | Status: AC
  Administered 2020-03-04: 15 mL via ORAL
  Filled 2020-03-04: qty 15

## 2020-03-04 MED ORDER — FAMOTIDINE IN NACL 20-0.9 MG/50ML-% IV SOLN
20.0000 mg | Freq: Once | INTRAVENOUS | Status: AC
  Administered 2020-03-04: 20 mg via INTRAVENOUS
  Filled 2020-03-04: qty 50

## 2020-03-04 MED ORDER — ALUM & MAG HYDROXIDE-SIMETH 200-200-20 MG/5ML PO SUSP
30.0000 mL | Freq: Once | ORAL | Status: AC
  Administered 2020-03-04: 30 mL via ORAL
  Filled 2020-03-04: qty 30

## 2020-03-04 MED ORDER — SUCRALFATE 1 GM/10ML PO SUSP: 1.0000 g | Freq: Three times a day (TID) | ORAL | 0 refills | Status: DC

## 2020-03-04 MED ORDER — FAMOTIDINE 20 MG PO TABS: 20.0000 mg | ORAL_TABLET | Freq: Two times a day (BID) | ORAL | 0 refills | Status: DC

## 2020-03-04 MED ORDER — PANTOPRAZOLE SODIUM 40 MG IV SOLR
40.0000 mg | Freq: Once | INTRAVENOUS | Status: AC
  Administered 2020-03-04: 40 mg via INTRAVENOUS
  Filled 2020-03-04: qty 40

## 2020-03-04 MED ORDER — ALBUTEROL SULFATE HFA 108 (90 BASE) MCG/ACT IN AERS: 1.0000 | INHALATION_SPRAY | Freq: Four times a day (QID) | RESPIRATORY_TRACT | 0 refills | Status: DC | PRN

## 2020-03-04 NOTE — ED Provider Notes (Addendum)
[facility] EMERGENCY DEPARTMENT Provider Note   CSN: [id number] Arrival date & time: 03/04/20  [id number]     History Chief Complaint  Patient presents with  . Abdominal Pain    Eric Ingram is a 59 y.o. male.  The history is provided by the patient. The history is limited by a language barrier. A language interpreter was used.        Eric Ingram is a 59 y.o. male, with a history of CHF, GERD, HTN, anemia, COVID-19 infection, presenting to the ED with abdominal pain for the last 10 days.  He describes his discomfort starting in the epigastric region, feels like a fullness, radiates into the chest, worse with eating, moderate in intensity.  He has had similar discomfort intermittently for the last 2 years. His discomfort is accompanied by nausea.  He has also had decreased appetite due to his discomfort and nausea.  He ate a small amount of food this morning.  He feels as though he is constipated.  Last bowel movement was this morning with small pellet stool.  He is still passing gas.  He was admitted March 11 for shortness of breath and hypoxia due to COVID-19 infection and discharged March 15.   He notes he still has a cough, but his cough nor his previous shortness of breath have worsened.  Denies fever/chills, exertional chest pain, vomiting, diarrhea, hematochezia/melena, dizziness, syncope, or any other complaints.   Past Medical History:  Diagnosis Date  . Anemia   . CHF (congestive heart failure) ([place])   . Dental infection 08/02/2018  . GERD (gastroesophageal reflux disease)   . Hypertension   . Shingles rash 07/07/2019  . Thalassemia, alpha ([place])   . Viral gastritis 02/08/2019    Patient Active Problem List   Diagnosis Date Noted  . Acute respiratory failure with hypoxia ([place])   . Pneumonia due to COVID-19 virus 02/22/2020  . Viral URI 10/10/2019  . Cervical radiculopathy 09/12/2019  . Post herpetic neuralgia 08/29/2019  . EKG abnormalities 08/29/2019    . Adhesive capsulitis of both shoulders 08/29/2019  . Gastro-esophageal reflux 07/18/2019  . Wheezing on both sides of chest 02/16/2019  . Tachycardia 02/08/2019  . Cough 02/08/2019  . Constipation 02/08/2019  . Shoulder pain, left 02/08/2019  . Abdominal bloating 11/07/2018  . Acute on chronic diastolic heart failure ([place])   . Iron deficiency anemia due to chronic blood loss   . Chronic sinusitis 01/05/2018  . Health care maintenance 01/05/2018  . Hypertension 10/16/2017    Past Surgical History:  Procedure Laterality Date  . NO PAST SURGERIES         Family History  Problem Relation Age of Onset  . Colon cancer Neg Hx   . Stomach cancer Neg Hx     Social History   Tobacco Use  . Smoking status: Former Smoker    Quit date: 12/15/1999    Years since quitting: 20.2  . Smokeless tobacco: Never Used  Substance Use Topics  . Alcohol use: No    Comment: former  . Drug use: No    Home Medications Prior to Admission medications   Medication Sig Start Date End Date Taking? Authorizing Provider  albuterol (VENTOLIN HFA) 108 (90 Base) MCG/ACT inhaler Inhale 1-2 puffs into the lungs every 6 (six) hours as needed for wheezing or shortness of breath. 03/04/20   Chanele Douglas C, PA-C  amLODipine (NORVASC) 2.5 MG tablet Take 2.5 mg by mouth daily. 01/09/20   [provider]  benzonatate (TESSALON) 100 MG capsule Take 1 capsule (100 mg total) by mouth 3 (three) times daily. 02/26/20   Katherine Roan, MD  diclofenac Sodium (VOLTAREN) 1 % GEL Apply 2 g topically 4 (four) times daily. 02/09/20 03/10/20  Al Decant, MD  famotidine (PEPCID) 20 MG tablet Take 1 tablet (20 mg total) by mouth 2 (two) times daily for 5 days. 03/04/20 03/09/20  Josie Mesa C, PA-C  fluticasone (FLONASE) 50 MCG/ACT nasal spray Place 2 sprays into both nostrils daily. 10/11/18 04/09/19  Kathi Ludwig, MD  gabapentin (NEURONTIN) 300 MG capsule TAKE 2 CAPSULES BY MOUTH THREE TIMES DAILY. TAKE UP TO 2  CAPSULES IN THE MORNING, AFTERNOON, AND AT BEDTIME Patient taking differently: Take 600 mg by mouth 3 (three) times daily.  12/22/19   Axel Filler, MD  guaiFENesin-dextromethorphan (ROBITUSSIN DM) 100-10 MG/5ML syrup Take 15 mLs by mouth every 6 (six) hours for 15 days. 02/26/20 03/12/20  Katherine Roan, MD  lisinopril (ZESTRIL) 40 MG tablet Take 1 tablet (40 mg total) by mouth daily. 02/09/20   Al Decant, MD  metoprolol succinate (TOPROL-XL) 25 MG 24 hr tablet Take 25 mg by mouth daily. 02/02/20   [provider]  naproxen (NAPROSYN) 500 MG tablet Take 1 tablet (500 mg total) by mouth 2 (two) times daily as needed. Patient not taking: Reported on 02/22/2020 02/09/20 02/08/21  Al Decant, MD  pantoprazole (PROTONIX) 40 MG tablet Take 1 tablet (40 mg total) by mouth 2 (two) times daily. 02/09/20   Al Decant, MD  Phenyleph-Doxylamine-DM-APAP (NYQUIL SEVERE COLD/FLU) 5-6.25-10-325 MG/15ML LIQD Take 30 mLs by mouth every 6 (six) hours as needed (cold).    [provider]  sucralfate (CARAFATE) 1 GM/10ML suspension Take 10 mLs (1 g total) by mouth 4 (four) times daily -  with meals and at bedtime. 03/04/20   Zaylin Runco, Helane Gunther, PA-C    Allergies    Patient has no known allergies.  Review of Systems   Review of Systems  Constitutional: Negative for chills, diaphoresis and fever.  Respiratory: Negative for cough and shortness of breath.   Cardiovascular: Negative for leg swelling.  Gastrointestinal: Positive for abdominal pain, constipation and nausea. Negative for blood in stool, diarrhea and vomiting.  Genitourinary: Negative for dysuria, frequency and hematuria.  Musculoskeletal: Negative for back pain.  Neurological: Negative for dizziness and syncope.  All other systems reviewed and are negative.   Physical Exam Updated Vital Signs BP 124/89 (BP Location: Left Arm)   Pulse (!) 104   Temp 97.8 F (36.6 C) (Oral)   Resp 17   SpO2 98%   Physical  Exam Vitals and nursing note reviewed.  Constitutional:      General: He is not in acute distress.    Appearance: He is well-developed. He is not diaphoretic.  HENT:     Head: Normocephalic and atraumatic.     Mouth/Throat:     Mouth: Mucous membranes are moist.     Pharynx: Oropharynx is clear.  Eyes:     Conjunctiva/sclera: Conjunctivae normal.  Cardiovascular:     Rate and Rhythm: Regular rhythm. Tachycardia present.     Pulses: Normal pulses.          Radial pulses are 2+ on the right side and 2+ on the left side.       Posterior tibial pulses are 2+ on the right side and 2+ on the left side.     Heart sounds: Normal heart sounds.  Comments: Tactile temperature in the extremities appropriate and equal bilaterally. Mildly tachycardic. Pulmonary:     Effort: Tachypnea present.     Breath sounds: Normal breath sounds.  Abdominal:     Palpations: Abdomen is soft.     Tenderness: There is abdominal tenderness in the epigastric area. There is no guarding.     Comments: Patient verbally indicates tenderness in the epigastric region, but shows no other reaction.  Genitourinary:    Comments: Rectal Exam:  No external hemorrhoids, fissures, or lesions noted.  No frank blood or melena. No stool burden.  No rectal tenderness. No foreign bodies noted.   RN, Jenny Reichmann, served as chaperone during the rectal exam. Musculoskeletal:     Cervical back: Neck supple.     Right lower leg: No edema.     Left lower leg: No edema.  Lymphadenopathy:     Cervical: No cervical adenopathy.  Skin:    General: Skin is warm and dry.  Neurological:     Mental Status: He is alert.  Psychiatric:        Mood and Affect: Mood and affect normal.        Speech: Speech normal.        Behavior: Behavior normal.     ED Results / Procedures / Treatments   Labs (all labs ordered are listed, but only abnormal results are displayed) Labs Reviewed  CBC WITH DIFFERENTIAL/PLATELET - Abnormal; Notable for the  following components:      Result Value   WBC 16.7 (*)    Hemoglobin 10.9 (*)    HCT 34.2 (*)    MCV 68.8 (*)    MCH 21.9 (*)    RDW 16.0 (*)    Platelets 698 (*)    Neutro Abs 13.5 (*)    Monocytes Absolute 1.1 (*)    Abs Immature Granulocytes 0.15 (*)    All other components within normal limits  COMPREHENSIVE METABOLIC PANEL - Abnormal; Notable for the following components:   Sodium 134 (*)    Calcium 8.5 (*)    Albumin 2.5 (*)    AST 42 (*)    ALT 84 (*)    Alkaline Phosphatase 167 (*)    All other components within normal limits  LIPASE, BLOOD   WBC  Date Value Ref Range Status  03/04/2020 16.7 (H) 4.0 - 10.5 K/uL Final  02/26/2020 19.3 (H) 4.0 - 10.5 K/uL Final  02/25/2020 20.2 (H) 4.0 - 10.5 K/uL Final  02/24/2020 19.4 (H) 4.0 - 10.5 K/uL Final   Hemoglobin  Date Value Ref Range Status  03/04/2020 10.9 (L) 13.0 - 17.0 g/dL Final  02/26/2020 11.2 (L) 13.0 - 17.0 g/dL Final  02/25/2020 10.5 (L) 13.0 - 17.0 g/dL Final  02/24/2020 11.4 (L) 13.0 - 17.0 g/dL Final  09/02/2018 12.2 (L) 13.0 - 17.7 g/dL Final  02/16/2018 12.3 (L) 13.0 - 17.7 g/dL Final    EKG None  Radiology DG Chest Portable 1 View  Result Date: 03/04/2020 CLINICAL DATA:  Epigastric pain and nausea. History of COVID pneumonia EXAM: PORTABLE CHEST 1 VIEW COMPARISON:  Chest x-ray 02/23/2020 FINDINGS: The cardiac silhouette, mediastinal and hilar contours are within normal limits and stable. Persistent patchy bilateral lung infiltrates with slightly lower lung volumes and slightly progressive areas of airspace consolidation. No pleural effusions or pneumothorax. IMPRESSION: Persistent patchy bilateral lung infiltrates. Electronically Signed   By: Marijo Sanes M.D.   On: 03/04/2020 12:42   US Abdomen Limited RUQ  Result Date: 03/04/2020  CLINICAL DATA:  Epigastric pain and nausea. EXAM: ULTRASOUND ABDOMEN LIMITED RIGHT UPPER QUADRANT COMPARISON:  CT abdomen pelvis 08/30/2018. FINDINGS: Gallbladder: No  gallstones or wall thickening visualized. No sonographic Murphy sign noted by sonographer. Common bile duct: Diameter: 3 mm, within normal limits. Liver: No focal lesion identified. Within normal limits in parenchymal echogenicity. Portal vein is patent on color Doppler imaging with normal direction of blood flow towards the liver. Other: None. IMPRESSION: Normal exam Electronically Signed   By: Lorin Picket M.D.   On: 03/04/2020 12:12    Procedures Procedures (including critical care time)  Medications Ordered in ED Medications  pantoprazole (PROTONIX) injection 40 mg (40 mg Intravenous Given 03/04/20 1050)  famotidine (PEPCID) IVPB 20 mg premix (0 mg Intravenous Stopped 03/04/20 1120)  alum & mag hydroxide-simeth (MAALOX/MYLANTA) 200-200-20 MG/5ML suspension 30 mL (30 mLs Oral Given 03/04/20 1049)    And  lidocaine (XYLOCAINE) 2 % viscous mouth solution 15 mL (15 mLs Oral Given 03/04/20 1049)  sodium chloride 0.9 % bolus 500 mL (0 mLs Intravenous Stopped 03/04/20 1326)    ED Course  I have reviewed the triage vital signs and the nursing notes.  Pertinent labs & imaging results that were available during my care of the patient were reviewed by me and considered in my medical decision making (see chart for details).  Clinical Course as of Mar 05 1455  Mon Mar 04, 2020  1300 Called for [place] interpreter. Service states they will call back soon.   [SJ]  1312 Pain has resolved, especially after GI cocktail. No shortness of breath. No chest pain.   [SJ]    Clinical Course User Index [SJ] Laddie Math C, PA-C   MDM Rules/Calculators/A&P                      Patient presents with epigastric pain that he has experienced before, but has been constant since his hospital admission. Patient is nontoxic appearing, afebrile, not tachypneic, not hypotensive, maintains excellent SPO2 on room air, and is in no apparent distress.  Initially mildly tachycardic which I suspect was due to his  epigastric discomfort.  He did have some initial tachypnea, but this actually resolved upon subsequent evaluations, despite what is documented in his vital signs.  Right before discharge, his SPO2 was at 96% and he was showing no increased work of breathing.  I have reviewed the patient's chart to obtain more information.   I reviewed and interpreted the patient's labs and radiological studies. Chest x-ray did not appear to be any worse than it was during his admission.  This is consistent with his COVID-19 diagnosis previously.  Patient symptoms suggest a gastric origin to his discomfort.  His symptoms resolved with GI cocktail.  He noted he has not been taking his Protonix and was advised to restart this.  The patient was given instructions for home care as well as return precautions. Patient voices understanding of these instructions, accepts the plan, and is comfortable with discharge.   Findings and plan of care discussed with Gerlene Fee, MD.     Final Clinical Impression(s) / ED Diagnoses Final diagnoses:  Epigastric pain    Rx / DC Orders ED Discharge Orders         Ordered    albuterol (VENTOLIN HFA) 108 (90 Base) MCG/ACT inhaler  Every 6 hours PRN     03/04/20 1424    famotidine (PEPCID) 20 MG tablet  2 times daily  03/04/20 1424    sucralfate (CARAFATE) 1 GM/10ML suspension  3 times daily with meals & bedtime     03/04/20 1424           Lorayne Bender, PA-C 03/04/20 1501    Lorayne Bender, PA-C 03/04/20 1615    Maudie Flakes, MD 03/05/20 [phone]

## 2020-03-04 NOTE — ED Notes (Signed)
Pt states he feels much better. Pt given water per pa.

## 2020-03-04 NOTE — Discharge Instructions (Addendum)
  Diet: Start with a clear liquid diet, progressed to a full liquid diet, and then bland solids as you are able. Please adhere to the enclosed dietary suggestions.  In general, avoid NSAIDs (i.e. ibuprofen, naproxen, etc.), caffeine, alcohol, spicy foods, fatty foods, or any other foods that seem to cause your symptoms to arise.  Protonix: Take this medication daily (previously prescribed), 20-30 minutes prior to your first meal, for the next 8 weeks.  Continue to take this medication even if you begin to feel better.  Pepcid: Take this medication twice a day for the next 5 days.  Follow-up: Please follow-up with your primary care provider or with the gastroenterologist on this matter.  Return: Return to the ED for significantly worsening symptoms, persistent vomiting, persistent fever, vomiting blood, blood in the stools, dark stools, or any other major concerns.  For prescription assistance, may try using prescription discount sites or apps, such as goodrx.com  Cough and sometimes shortness of breath from pneumonia can sometimes last 6 weeks even after active infection is gone. May use the albuterol inhaler, as needed, for shortness of breath.

## 2020-03-04 NOTE — ED Triage Notes (Signed)
Pt here from home with c/o aba pain and nause a, pt was discharged from hospital on the 15 th with covid

## 2020-03-07 ENCOUNTER — Other Ambulatory Visit: Payer: Self-pay

## 2020-03-07 ENCOUNTER — Encounter (HOSPITAL_COMMUNITY): Payer: Self-pay | Admitting: Emergency Medicine

## 2020-03-07 ENCOUNTER — Emergency Department (HOSPITAL_COMMUNITY)
Admission: EM | Admit: 2020-03-07 | Discharge: 2020-03-07 | Disposition: A | Discharge status: Home / Self Care | Payer: Self-pay | Attending: Emergency Medicine | Admitting: Emergency Medicine

## 2020-03-07 ENCOUNTER — Ambulatory Visit: Payer: Self-pay

## 2020-03-07 ENCOUNTER — Emergency Department (HOSPITAL_COMMUNITY): Payer: Self-pay

## 2020-03-07 DIAGNOSIS — Z79899 Other long term (current) drug therapy: Secondary | ICD-10-CM | POA: Insufficient documentation

## 2020-03-07 DIAGNOSIS — R0602 Shortness of breath: Secondary | ICD-10-CM | POA: Insufficient documentation

## 2020-03-07 DIAGNOSIS — I509 Heart failure, unspecified: Secondary | ICD-10-CM | POA: Insufficient documentation

## 2020-03-07 DIAGNOSIS — Z87891 Personal history of nicotine dependence: Secondary | ICD-10-CM | POA: Insufficient documentation

## 2020-03-07 DIAGNOSIS — R1013 Epigastric pain: Secondary | ICD-10-CM | POA: Insufficient documentation

## 2020-03-07 DIAGNOSIS — I10 Essential (primary) hypertension: Secondary | ICD-10-CM | POA: Insufficient documentation

## 2020-03-07 DIAGNOSIS — D560 Alpha thalassemia: Secondary | ICD-10-CM | POA: Insufficient documentation

## 2020-03-07 LAB — CBC
HCT: 33.6 % — ABNORMAL LOW (ref 39.0–52.0)
Hemoglobin: 10.6 g/dL — ABNORMAL LOW (ref 13.0–17.0)
MCH: 22 pg — ABNORMAL LOW (ref 26.0–34.0)
MCHC: 31.5 g/dL (ref 30.0–36.0)
MCV: 69.7 fL — ABNORMAL LOW (ref 80.0–100.0)
Platelets: 686 10*3/uL — ABNORMAL HIGH (ref 150–400)
RBC: 4.82 MIL/uL (ref 4.22–5.81)
RDW: 16.6 % — ABNORMAL HIGH (ref 11.5–15.5)
WBC: 16.8 10*3/uL — ABNORMAL HIGH (ref 4.0–10.5)
nRBC: 0 % (ref 0.0–0.2)

## 2020-03-07 LAB — BASIC METABOLIC PANEL
Anion gap: 10 (ref 5–15)
BUN: 21 mg/dL — ABNORMAL HIGH (ref 6–20)
CO2: 21 mmol/L — ABNORMAL LOW (ref 22–32)
Calcium: 8.9 mg/dL (ref 8.9–10.3)
Chloride: 104 mmol/L (ref 98–111)
Creatinine, Ser: 1.12 mg/dL (ref 0.61–1.24)
GFR calc Af Amer: >60 mL/min (ref >60)
GFR calc non Af Amer: >60 mL/min (ref >60)
Glucose, Bld: 94 mg/dL (ref 70–99)
Potassium: 4 mmol/L (ref 3.5–5.1)
Sodium: 135 mmol/L (ref 135–145)

## 2020-03-07 LAB — HEPATIC FUNCTION PANEL
ALT: 39 U/L (ref 0–44)
AST: 20 U/L (ref 15–41)
Albumin: 2.5 g/dL — ABNORMAL LOW (ref 3.5–5.0)
Alkaline Phosphatase: 143 U/L — ABNORMAL HIGH (ref 38–126)
Bilirubin, Direct: <0.1 mg/dL (ref 0.0–0.2)
Total Bilirubin: 0.6 mg/dL (ref 0.3–1.2)
Total Protein: 7.4 g/dL (ref 6.5–8.1)

## 2020-03-07 LAB — LIPASE, BLOOD: Lipase: 47 U/L (ref 11–51)

## 2020-03-07 LAB — TROPONIN I (HIGH SENSITIVITY): Troponin I (High Sensitivity): 6 ng/L (ref <18)

## 2020-03-07 IMAGING — DX DG CHEST 2V
2 series · 2 of 2 positions shown · non-contrast
Comparison: [DATE]

CLINICAL DATA: Respiratory distress. Short of breath and cough.
COVID positive test 2 weeks ago.

EXAM:
CHEST - 2 VIEW

[chest pa]
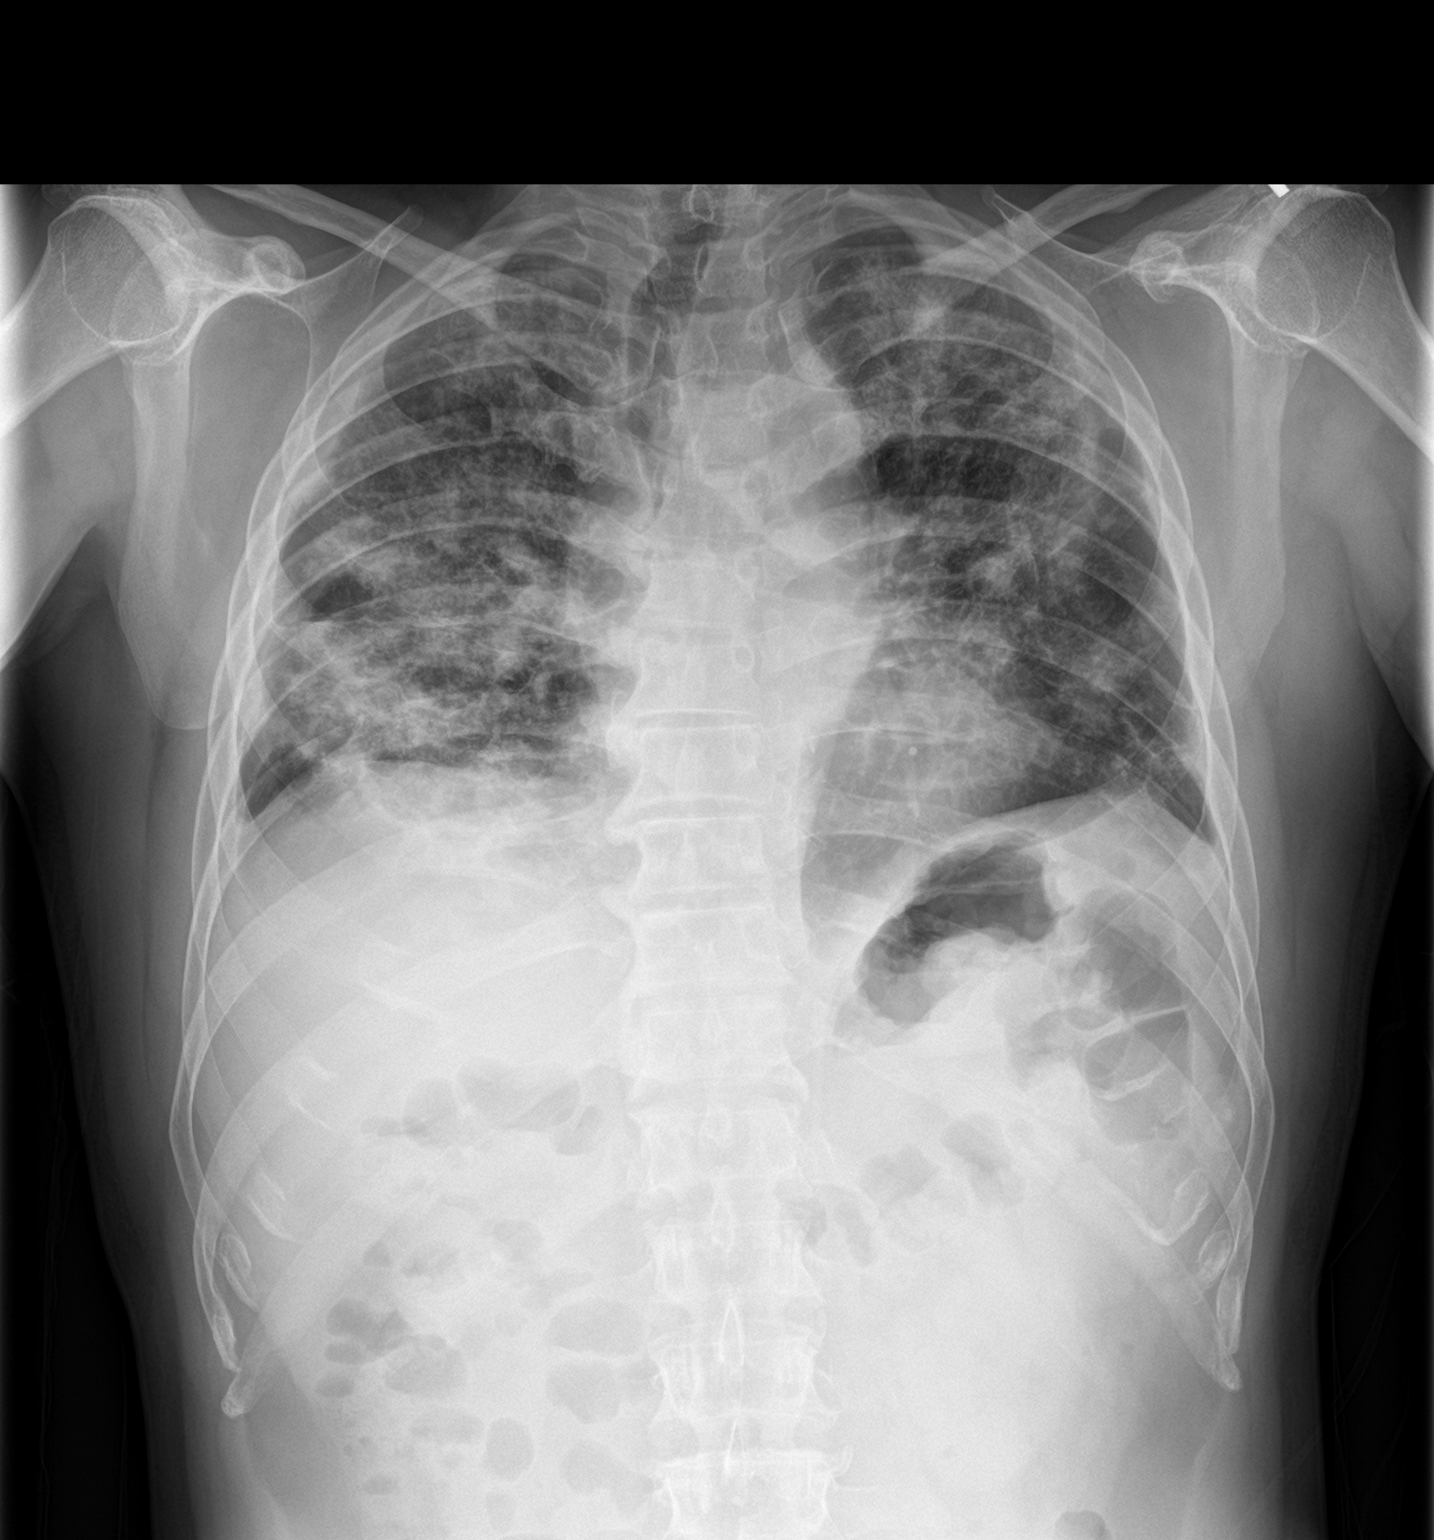

[chest lat]
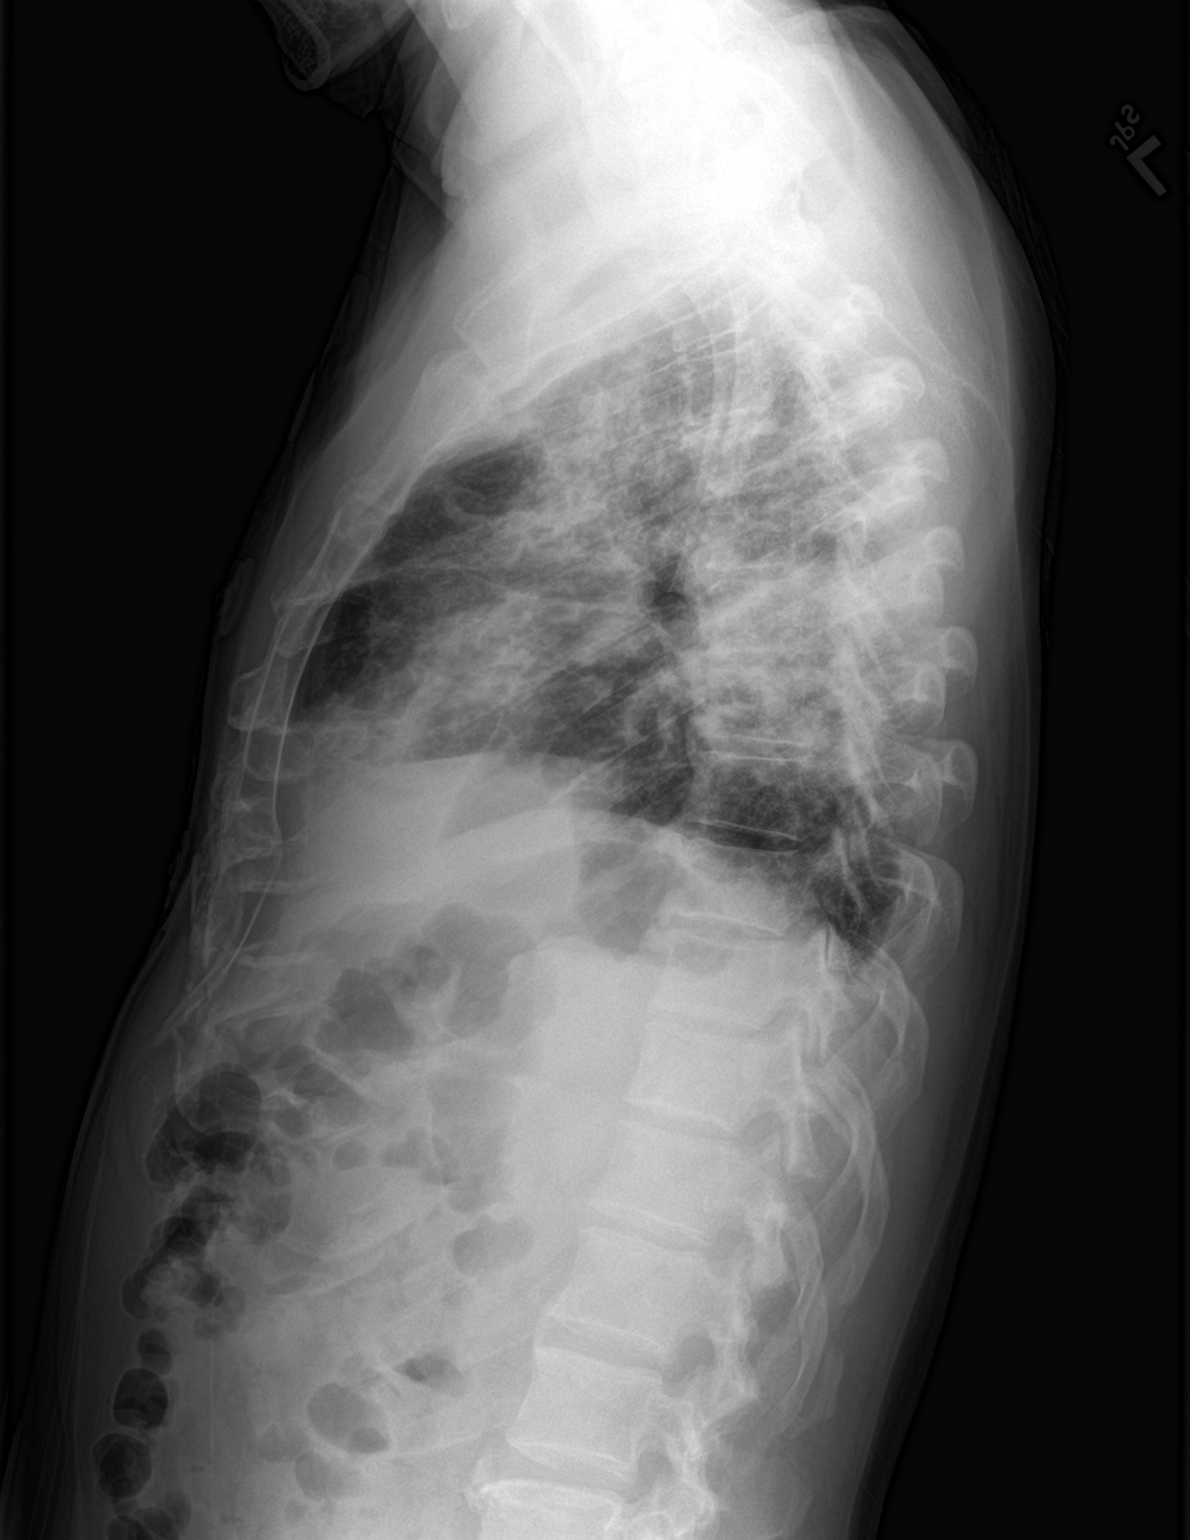

[2 of 2 positions shown; findings below may reference images not displayed]

FINDINGS: Bilateral patchy airspace lung opacities are without significant
change from the most recent prior study. No new lung abnormalities.

No convincing pleural effusion.  No pneumothorax.

Cardiac silhouette is normal in size.
IMPRESSION: 1. No significant change from the prior study.
2. Persistent bilateral airspace lung opacities consistent with
multifocal pneumonia.

## 2020-03-07 IMAGING — CT CT ABD-PELV W/ CM
2 of 5 series · 16 of 46 positions shown, 18 images · IV contrast (OMNI 350)
Comparison: None.

CLINICAL DATA: Epigastric pain.

EXAM:
CT ABDOMEN AND PELVIS WITH CONTRAST
TECHNIQUE: Multidetector CT imaging of the abdomen and pelvis was performed
using the standard protocol following bolus administration of
intravenous contrast.
CONTRAST:  100mL OMNIPAQUE IOHEXOL 350 MG/ML SOLN

[Series 4: a/p w/ 5mm · axial · 0.70mm/px · z∈[+1111,+1511]mm · 13 of 90 slices shown, 15 images]
[im 5/90  soft-tissue]
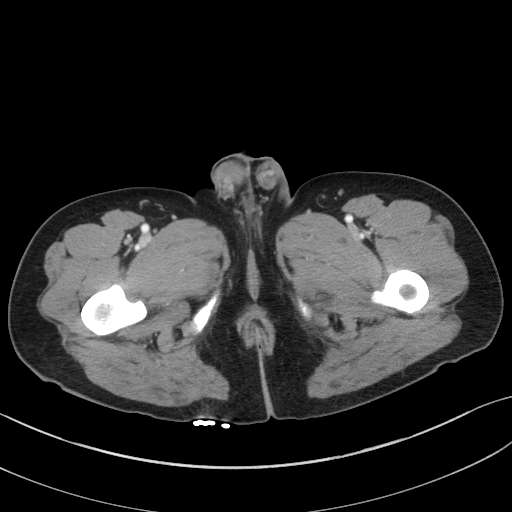
[im 5/90  bone]
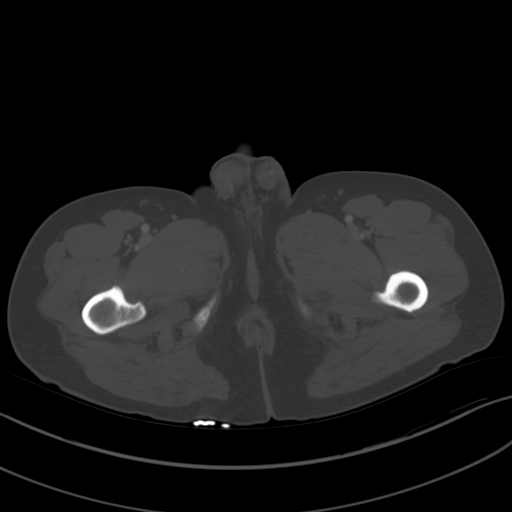
[im 15/90  soft-tissue]
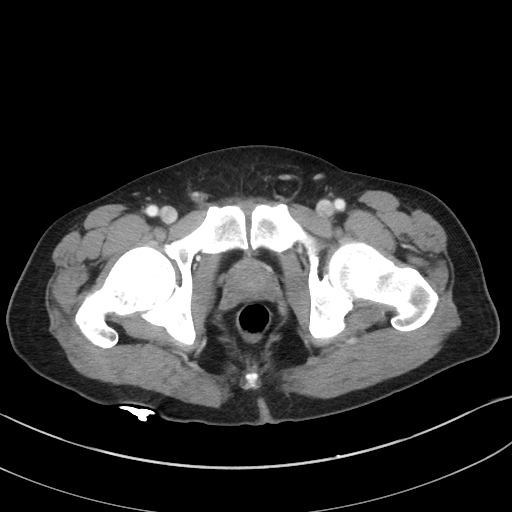
[im 19/90  soft-tissue]
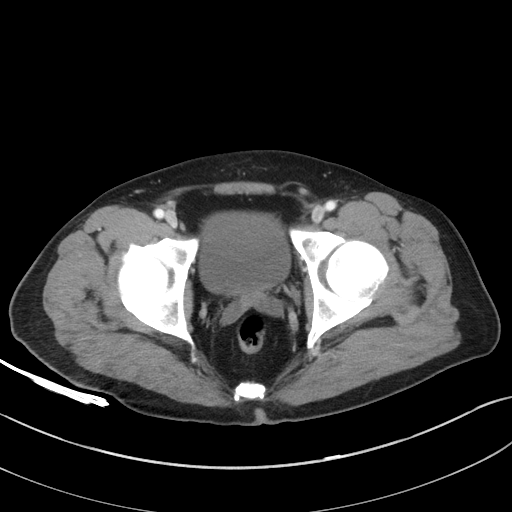
[im 24/90  soft-tissue]
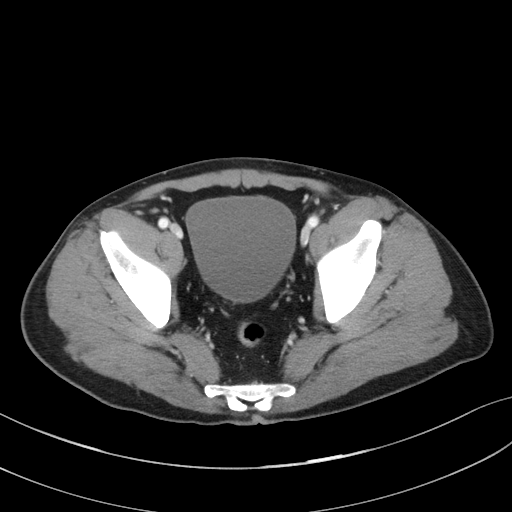
[im 33/90  soft-tissue]
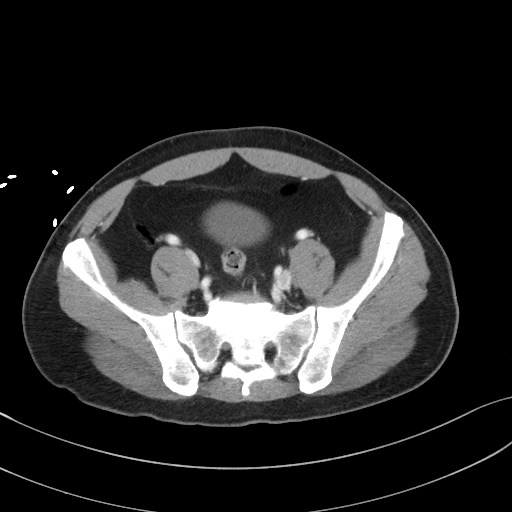
[im 38/90  soft-tissue]
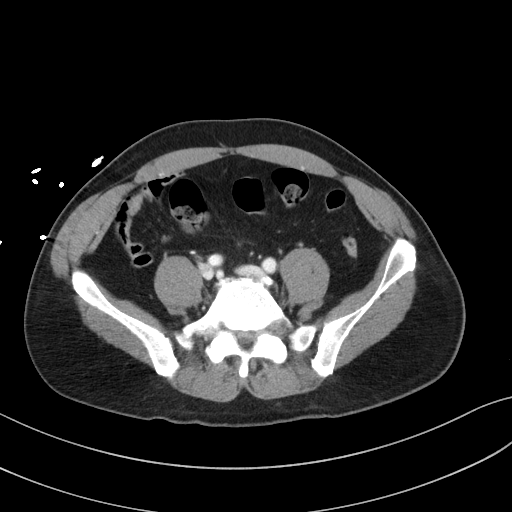
[im 47/90  soft-tissue]
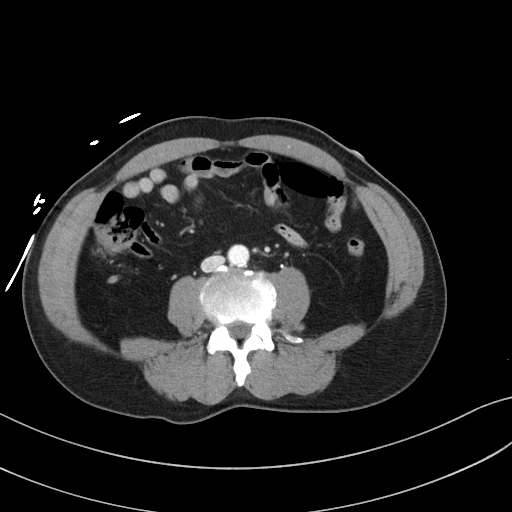
[im 52/90  soft-tissue]
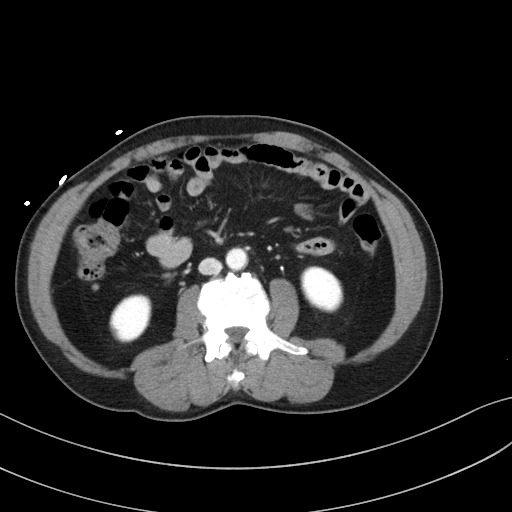
[im 57/90  soft-tissue]
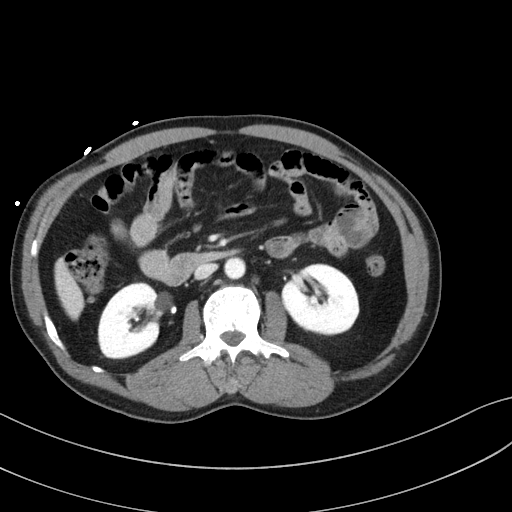
[im 57/90  bone]
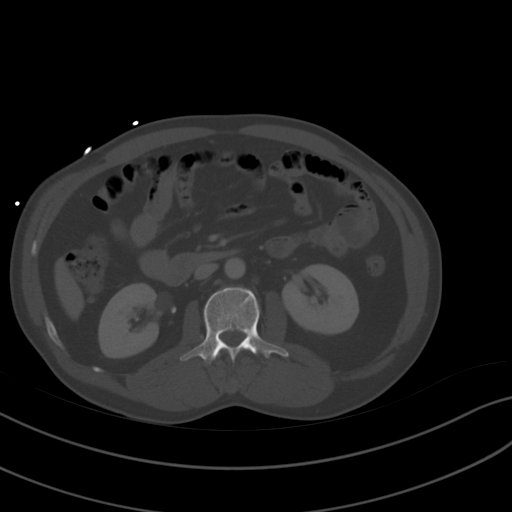
[im 66/90  soft-tissue]
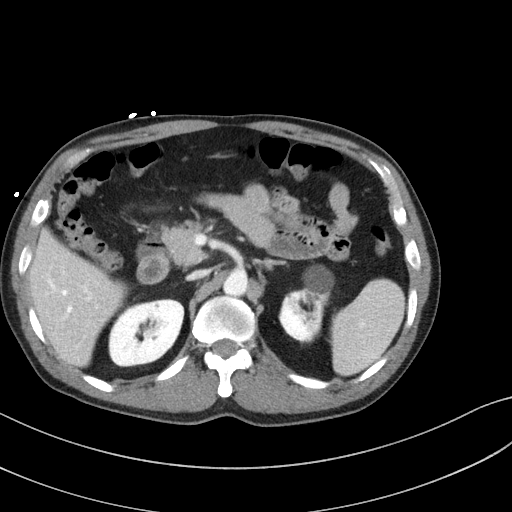
[im 71/90  soft-tissue]
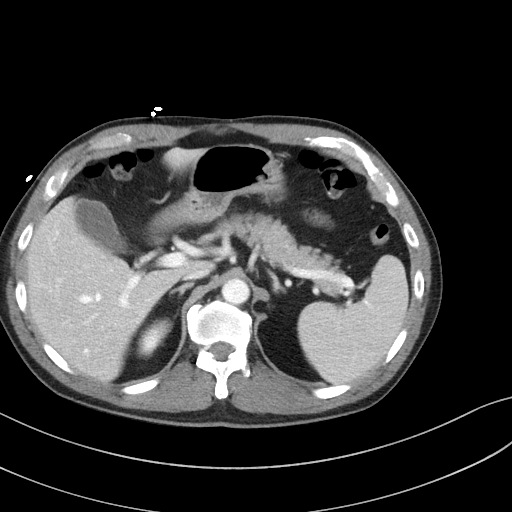
[im 75/90  soft-tissue]
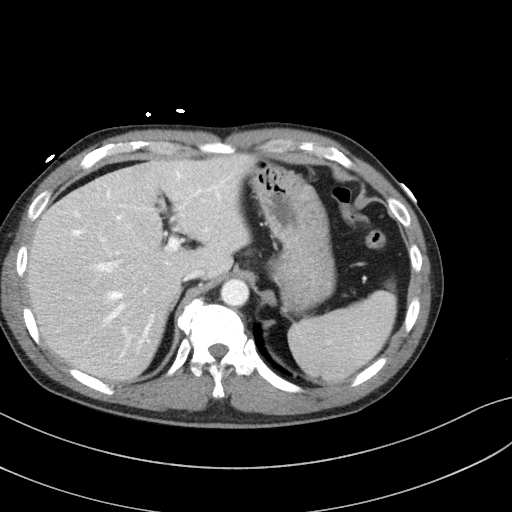
[im 85/90  soft-tissue]
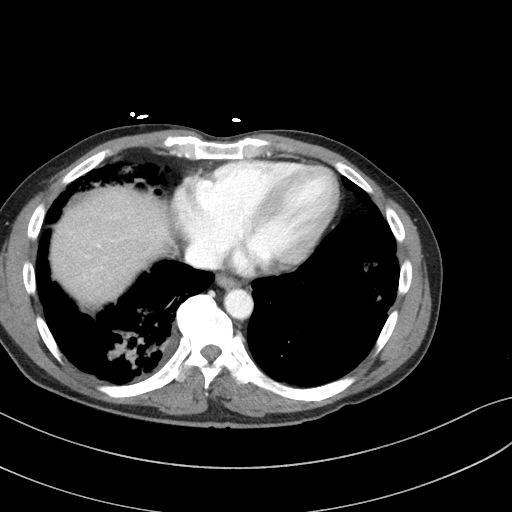

[Series 8: a/p w/ cor · coronal · 0.67mm/px · 3 of 151 slices shown]
[im 51/151  soft-tissue]
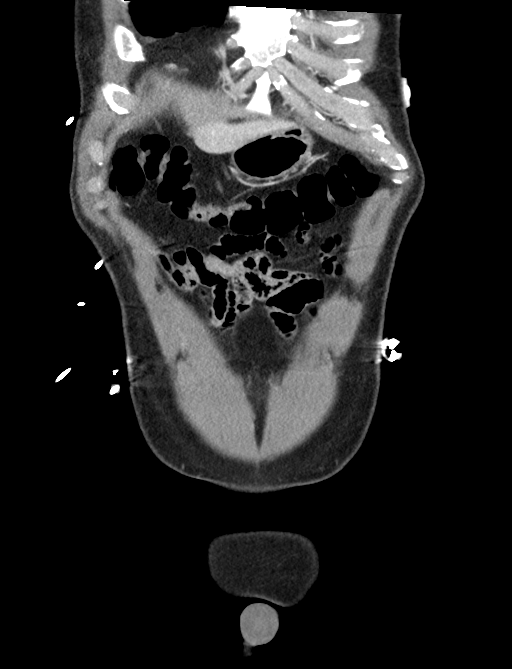
[im 67/151  soft-tissue]
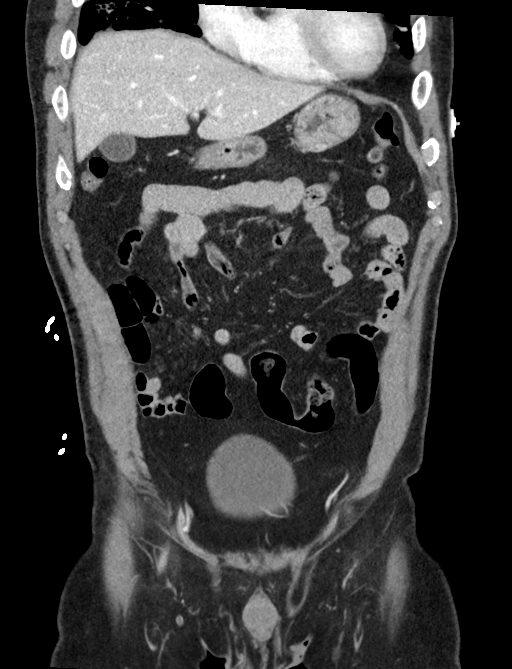
[im 84/151  soft-tissue]
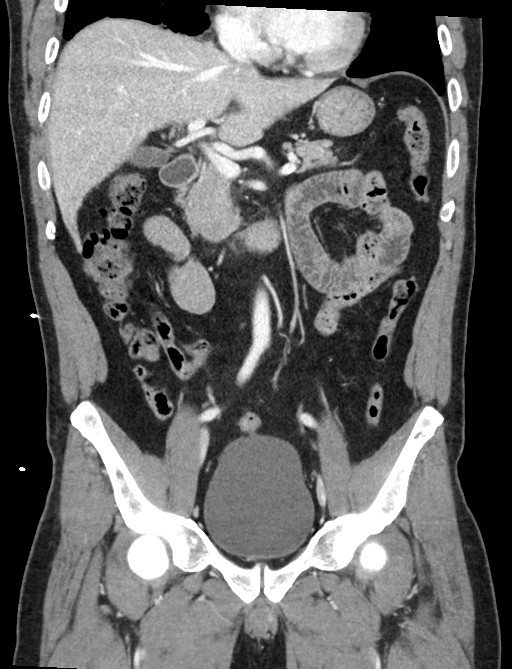

[16 of 46 positions shown; findings below may reference images not displayed]

FINDINGS: Lower chest: Marked severity patchy infiltrates are seen within the
bilateral lung bases.

There is a small right pleural effusion.

Hepatobiliary: No focal liver abnormality is seen. No gallstones,
gallbladder wall thickening, or biliary dilatation.

Pancreas: Unremarkable. No pancreatic ductal dilatation or
surrounding inflammatory changes.

Spleen: Normal in size without focal abnormality.

Adrenals/Urinary Tract: Adrenal glands are unremarkable. Kidneys are
normal in size, without renal calculi or hydronephrosis. Bilateral
renal cysts are seen. The largest measures approximately 2.4 cm in
diameter and is located along the anterior aspect of the mid left
kidney. Bladder is unremarkable.

Stomach/Bowel: Stomach is within normal limits. A 4 mm appendicolith
is seen within the lumen of an otherwise normal appearing appendix.
No evidence of bowel wall thickening, distention, or inflammatory
changes.

Vascular/Lymphatic: No significant vascular findings are present. No
enlarged abdominal or pelvic lymph nodes.

Reproductive: Prostate is unremarkable.

Other: There is a 2.0 cm x 1.9 cm fat containing left inguinal
hernia.

Musculoskeletal: No acute or significant osseous findings.
IMPRESSION: 1. Marked severity patchy infiltrates within the bilateral lung
bases with a small right pleural effusion.
2. Small fat-containing left inguinal hernia.
3. Appendicolith without evidence of appendicitis.
4. Bilateral renal cysts.

## 2020-03-07 IMAGING — CT CT ANGIO CHEST
3 of 7 series · 17 of 36 positions shown · IV contrast (OMNI 350)
Comparison: [DATE]

CLINICAL DATA: Shortness of breath and epigastric pain.

EXAM:
CT ANGIOGRAPHY CHEST WITH CONTRAST
TECHNIQUE: Multidetector CT imaging of the chest was performed using the
standard protocol during bolus administration of intravenous
contrast. Multiplanar CT image reconstructions and MIPs were
obtained to evaluate the vascular anatomy.
CONTRAST:  100mL OMNIPAQUE IOHEXOL 350 MG/ML SOLN

[Series 3: pe thins · axial · 0.74mm/px · z∈[+1422,+1677]mm · 13 of 299 slices shown]
[im 22/299  lung]
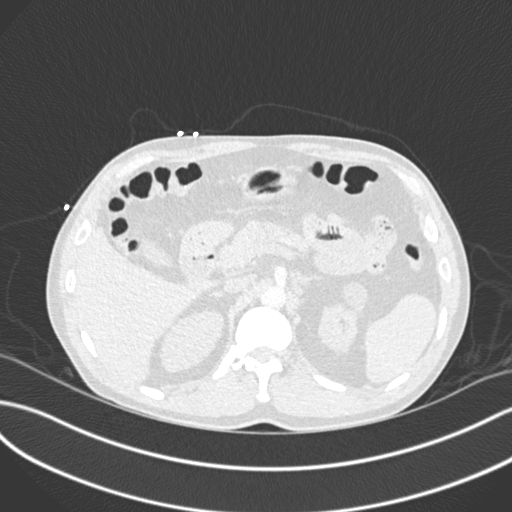
[im 43/299  mediastinal]
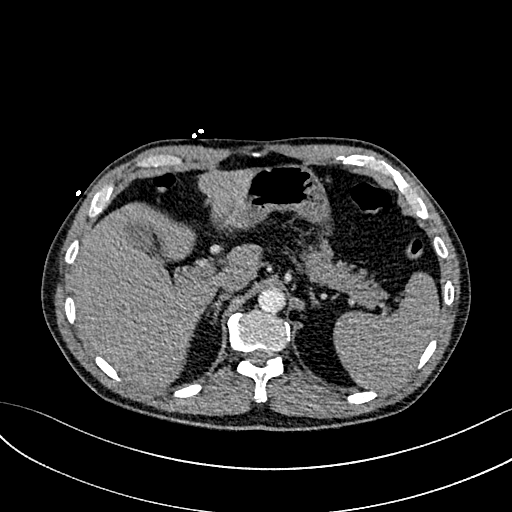
[im 64/299  lung]
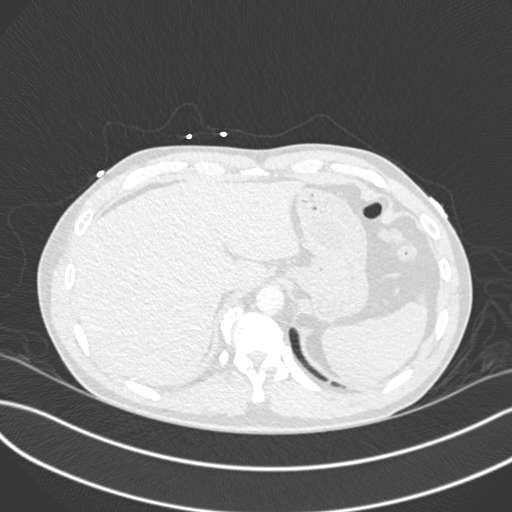
[im 86/299  mediastinal]
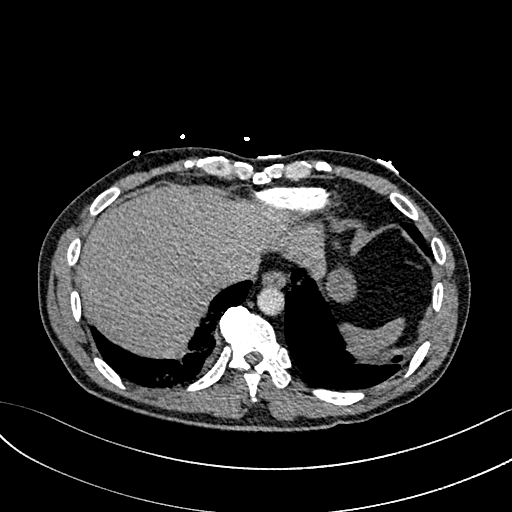
[im 107/299  lung]
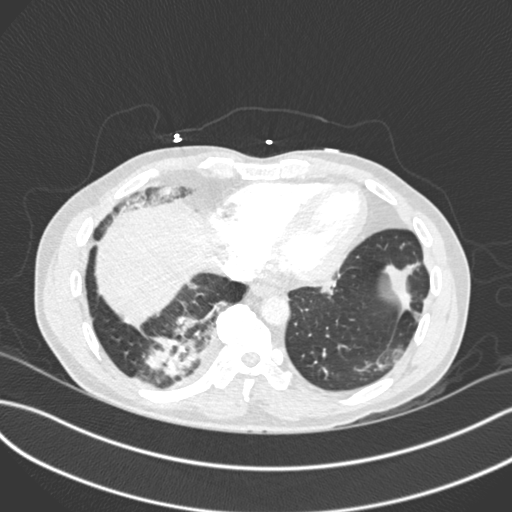
[im 128/299  mediastinal]
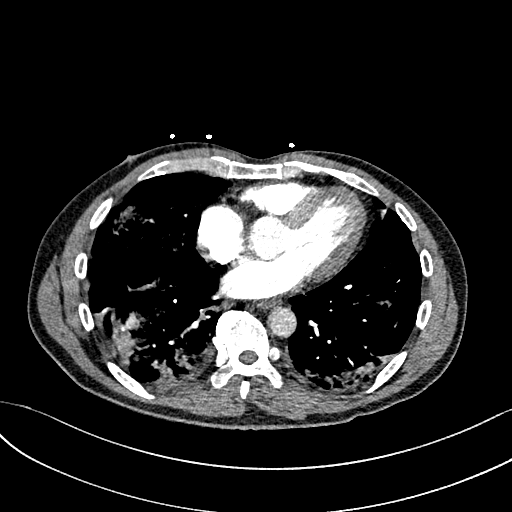
[im 150/299  lung]
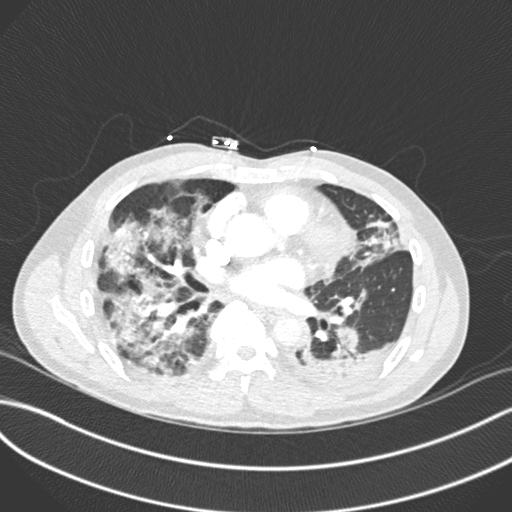
[im 171/299  mediastinal]
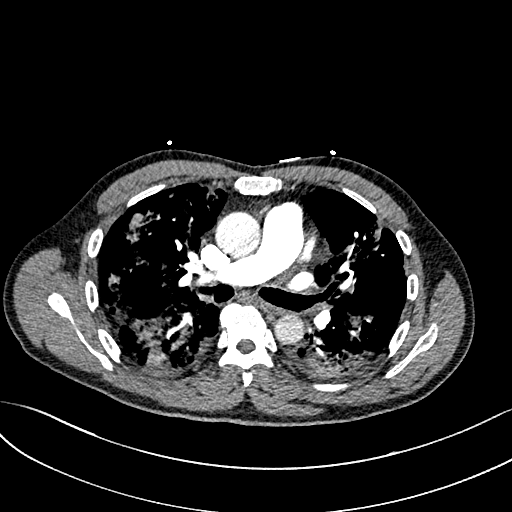
[im 192/299  lung]
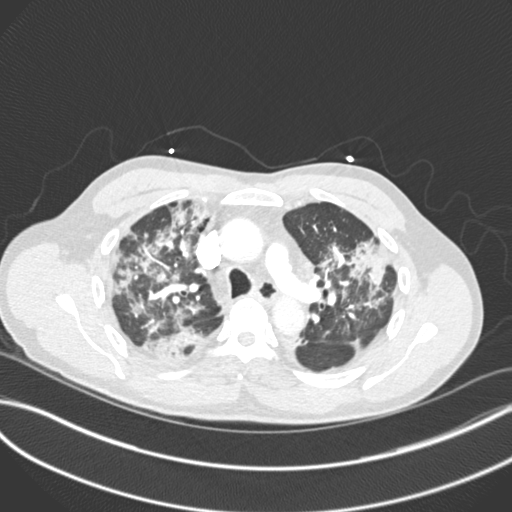
[im 213/299  mediastinal]
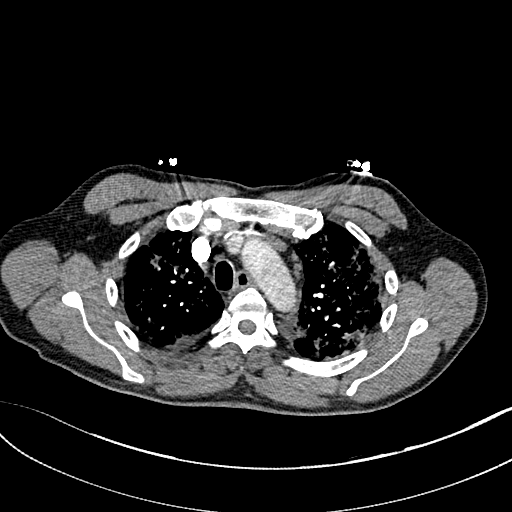
[im 235/299  lung]
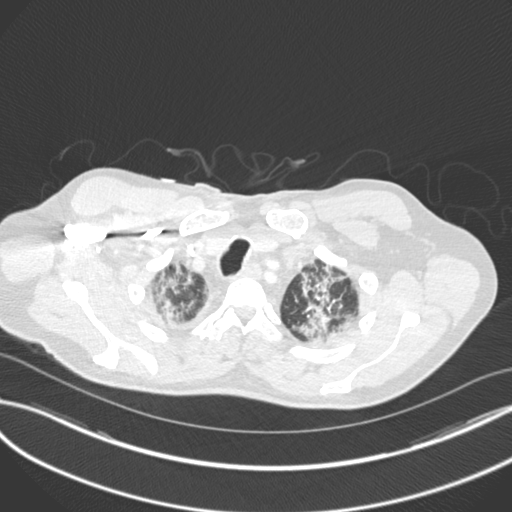
[im 256/299  mediastinal]
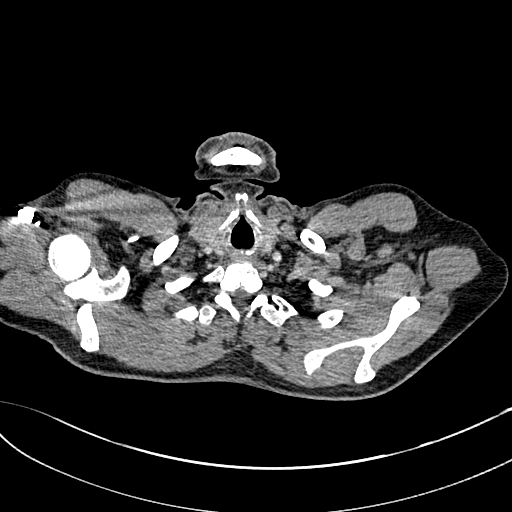
[im 277/299  lung]
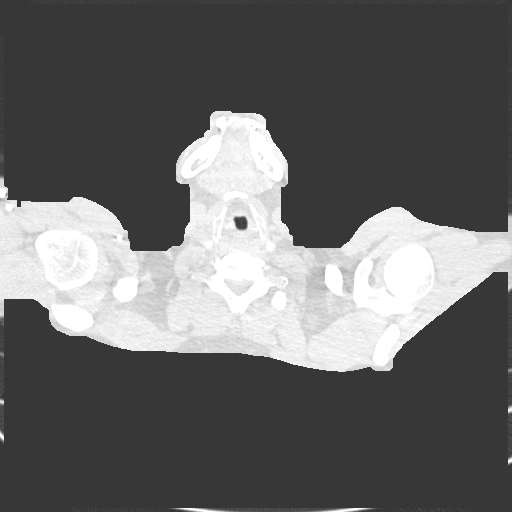

[Series 4: pe lung · axial · 0.74mm/px · z∈[+1497,+1605]mm · 3 of 109 slices shown]
[im 28/109  mediastinal]
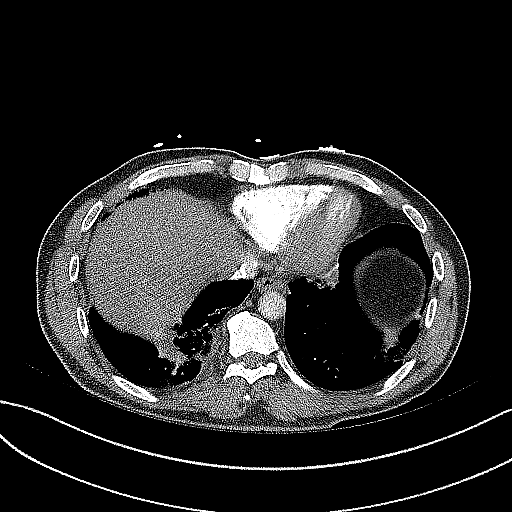
[im 55/109  mediastinal]
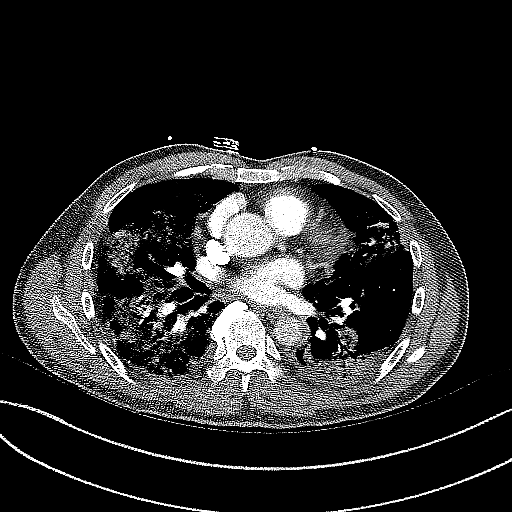
[im 82/109  mediastinal]
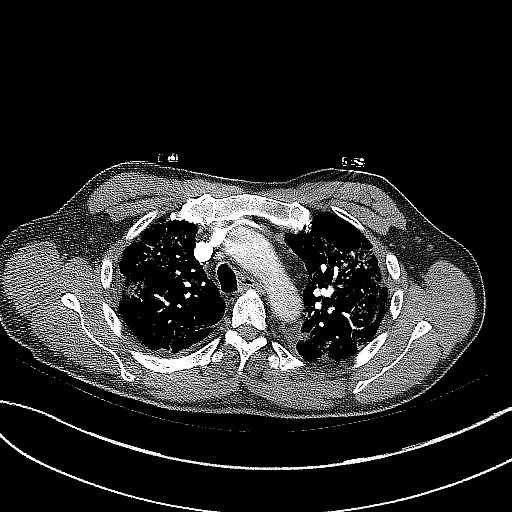

[Series 5: pe 2mm cor · coronal · 0.59mm/px · 1 of 151 slices shown]
[im 76/151  mediastinal]
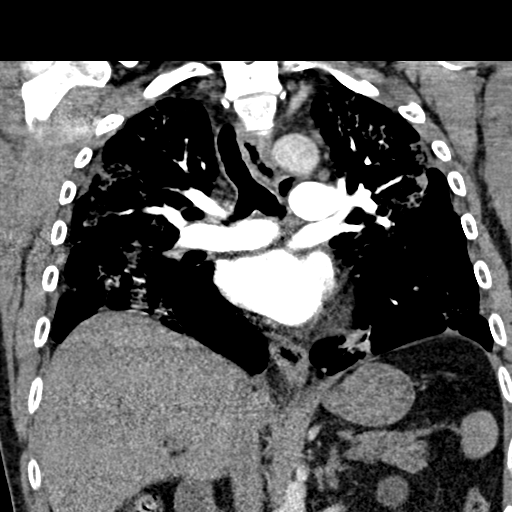

[17 of 36 positions shown; findings below may reference images not displayed]

FINDINGS: Cardiovascular: Satisfactory opacification of the pulmonary arteries
to the segmental level. No evidence of pulmonary embolism. Normal
heart size. No pericardial effusion.

Mediastinum/Nodes: No enlarged mediastinal, hilar, or axillary lymph
nodes. Thyroid gland, trachea, and esophagus demonstrate no
significant findings.

Lungs/Pleura: Marked severity diffuse patchy bilateral infiltrates
are seen.

There is a small right pleural effusion.

No pneumothorax is identified.

Upper Abdomen: A 2.5 cm exophytic cyst is seen along the anterior
aspect of the mid left kidney. Adjacent posterolateral renal
cortical scarring is seen.

Musculoskeletal: No chest wall abnormality. No acute or significant
osseous findings.

Review of the MIP images confirms the above findings.
IMPRESSION: 1. No CT evidence of acute pulmonary embolism.
2. Marked severity diffuse patchy bilateral infiltrates, consistent
with multifocal pneumonia.
3. Small right pleural effusion.

## 2020-03-07 MED ORDER — FAMOTIDINE IN NACL 20-0.9 MG/50ML-% IV SOLN: 20.0000 mg | Freq: Once | INTRAVENOUS | Status: DC

## 2020-03-07 MED ORDER — PANTOPRAZOLE SODIUM 40 MG IV SOLR
40.0000 mg | Freq: Once | INTRAVENOUS | Status: AC
  Administered 2020-03-07: 40 mg via INTRAVENOUS
  Filled 2020-03-07: qty 40

## 2020-03-07 MED ORDER — ALUM & MAG HYDROXIDE-SIMETH 200-200-20 MG/5ML PO SUSP
30.0000 mL | Freq: Once | ORAL | Status: AC
  Administered 2020-03-07: 30 mL via ORAL
  Filled 2020-03-07: qty 30

## 2020-03-07 MED ORDER — IOHEXOL 350 MG/ML SOLN
100.0000 mL | Freq: Once | INTRAVENOUS | Status: AC | PRN
  Administered 2020-03-07: 100 mL via INTRAVENOUS

## 2020-03-07 MED ORDER — LIDOCAINE VISCOUS HCL 2 % MT SOLN
15.0000 mL | Freq: Once | OROMUCOSAL | Status: AC
  Administered 2020-03-07: 15 mL via ORAL
  Filled 2020-03-07: qty 15

## 2020-03-07 MED ORDER — SODIUM CHLORIDE 0.9% FLUSH: 3.0000 mL | Freq: Once | INTRAVENOUS | Status: DC

## 2020-03-07 NOTE — ED Notes (Signed)
O2 titrated down to 2lit [place] Patient maintains sat at 100%  Will continue to monitor

## 2020-03-07 NOTE — ED Notes (Signed)
Pt was discharged from the ED. Pt read and understood discharge paperwork. Pt had vital signs completed. Pt conscious, breathing, and A&Ox4. No distress noted. Pt speaking in complete sentences. Pt brought out of the ED via wheelchair. E-signature not available. 

## 2020-03-07 NOTE — ED Provider Notes (Signed)
[facility] EMERGENCY DEPARTMENT Provider Note   CSN: [id number] Arrival date & time: 03/07/20  [id number]     History Chief Complaint  Patient presents with  . Shortness of Breath  . Abdominal Pain    Eric Ingram is a 59 y.o. male.  Eric Ingram is a 59 y.o. male with a history of CHF, hypertension, GERD, thalassemia, and recent Covid infection, who presents to the emergency department for evaluation of shortness of breath and epigastric abdominal pain.  Patient was admitted with Covid pneumonia on March 11-March 15, was discharged home on supplemental oxygen which he states he has needed intermittently.  When he arrived in the ED he was placed on 2 L nasal cannula but while waiting in the waiting room his shortness of breath worsened and he was noted to be satting at 85% on 2 L, was increased to 4 L with improvement but remains tachypneic.  He reports an occasional nonproductive cough.  He reports epigastric pain that radiates up into his chest, he was seen for similar pain on 3/22 and had a reassuring evaluation including right upper quadrant ultrasound and was discharged home and encouraged to use his Protonix.  He reports he is continued to have this pain, has been nauseated but has not vomited, has had some constipation but was able to pass stools this morning.  He reports pain radiates up into his chest and is a constant dull ache that is nonradiating.  No other aggravating or alleviating factors.  [place] interpreter at bedside to help obtain history.        Past Medical History:  Diagnosis Date  . Anemia   . CHF (congestive heart failure) ([place])   . Dental infection 08/02/2018  . GERD (gastroesophageal reflux disease)   . Hypertension   . Shingles rash 07/07/2019  . Thalassemia, alpha ([place])   . Viral gastritis 02/08/2019    Patient Active Problem List   Diagnosis Date Noted  . Acute respiratory failure with hypoxia ([place])   . Pneumonia due to COVID-19 virus  02/22/2020  . Viral URI 10/10/2019  . Cervical radiculopathy 09/12/2019  . Post herpetic neuralgia 08/29/2019  . EKG abnormalities 08/29/2019  . Adhesive capsulitis of both shoulders 08/29/2019  . Gastro-esophageal reflux 07/18/2019  . Wheezing on both sides of chest 02/16/2019  . Tachycardia 02/08/2019  . Cough 02/08/2019  . Constipation 02/08/2019  . Shoulder pain, left 02/08/2019  . Abdominal bloating 11/07/2018  . Acute on chronic diastolic heart failure ([place])   . Iron deficiency anemia due to chronic blood loss   . Chronic sinusitis 01/05/2018  . Health care maintenance 01/05/2018  . Hypertension 10/16/2017    Past Surgical History:  Procedure Laterality Date  . NO PAST SURGERIES         Family History  Problem Relation Age of Onset  . Colon cancer Neg Hx   . Stomach cancer Neg Hx     Social History   Tobacco Use  . Smoking status: Former Smoker    Quit date: 12/15/1999    Years since quitting: 20.2  . Smokeless tobacco: Never Used  Substance Use Topics  . Alcohol use: No    Comment: former  . Drug use: No    Home Medications Prior to Admission medications   Medication Sig Start Date End Date Taking? Authorizing Provider  albuterol (VENTOLIN HFA) 108 (90 Base) MCG/ACT inhaler Inhale 1-2 puffs into the lungs every 6 (six) hours as needed for wheezing or shortness of  breath. 03/04/20  Yes Joy, Shawn C, PA-C  amLODipine (NORVASC) 2.5 MG tablet Take 2.5 mg by mouth daily. 01/09/20  Yes [provider]  benzonatate (TESSALON) 100 MG capsule Take 1 capsule (100 mg total) by mouth 3 (three) times daily. 02/26/20  Yes Katherine Roan, MD  diclofenac Sodium (VOLTAREN) 1 % GEL Apply 2 g topically 4 (four) times daily. 02/09/20 03/10/20 Yes Al Decant, MD  famotidine (PEPCID) 20 MG tablet Take 1 tablet (20 mg total) by mouth 2 (two) times daily for 5 days. 03/04/20 03/09/20 Yes Joy, Shawn C, PA-C  fluticasone (FLONASE) 50 MCG/ACT nasal spray Place 2 sprays into  both nostrils daily. 10/11/18 03/07/20 Yes Harbrecht, Purcell Nails, MD  gabapentin (NEURONTIN) 300 MG capsule TAKE 2 CAPSULES BY MOUTH THREE TIMES DAILY. TAKE UP TO 2 CAPSULES IN THE MORNING, AFTERNOON, AND AT BEDTIME Patient taking differently: Take 600 mg by mouth 3 (three) times daily.  12/22/19  Yes Axel Filler, MD  guaiFENesin-dextromethorphan (ROBITUSSIN DM) 100-10 MG/5ML syrup Take 15 mLs by mouth every 6 (six) hours for 15 days. 02/26/20 03/12/20 Yes Katherine Roan, MD  lisinopril (ZESTRIL) 40 MG tablet Take 1 tablet (40 mg total) by mouth daily. 02/09/20  Yes Al Decant, MD  pantoprazole (PROTONIX) 40 MG tablet Take 1 tablet (40 mg total) by mouth 2 (two) times daily. 02/09/20  Yes Al Decant, MD  Phenyleph-Doxylamine-DM-APAP (NYQUIL SEVERE COLD/FLU) 5-6.25-10-325 MG/15ML LIQD Take 30 mLs by mouth every 6 (six) hours as needed (cold).   Yes [provider]  metoprolol succinate (TOPROL-XL) 25 MG 24 hr tablet Take 25 mg by mouth daily. 02/02/20   [provider]  naproxen (NAPROSYN) 500 MG tablet Take 1 tablet (500 mg total) by mouth 2 (two) times daily as needed. Patient not taking: Reported on 02/22/2020 02/09/20 02/08/21  Al Decant, MD  sucralfate (CARAFATE) 1 GM/10ML suspension Take 10 mLs (1 g total) by mouth 4 (four) times daily -  with meals and at bedtime. 03/04/20   Joy, Helane Gunther, PA-C    Allergies    Patient has no known allergies.  Review of Systems   Review of Systems  Constitutional: Negative for chills and fever.  HENT: Negative for congestion.   Respiratory: Positive for cough and shortness of breath.   Cardiovascular: Positive for chest pain.  Gastrointestinal: Positive for abdominal pain, constipation and nausea. Negative for blood in stool and vomiting.  Genitourinary: Negative for dysuria and frequency.  Musculoskeletal: Negative for arthralgias and myalgias.  Skin: Negative for color change and rash.  Neurological: Negative for  syncope and light-headedness.  All other systems reviewed and are negative.   Physical Exam Updated Vital Signs BP 127/80   Pulse 95   Temp 97.9 F (36.6 C) (Oral)   Resp (!) 30   SpO2 99%   Physical Exam Vitals and nursing note reviewed.  Constitutional:      General: He is not in acute distress.    Appearance: He is well-developed and normal weight. He is not diaphoretic.  HENT:     Head: Normocephalic and atraumatic.     Mouth/Throat:     Mouth: Mucous membranes are moist.     Pharynx: Oropharynx is clear.  Eyes:     General:        Right eye: No discharge.        Left eye: No discharge.     Pupils: Pupils are equal, round, and reactive to light.  Cardiovascular:  Rate and Rhythm: Normal rate and regular rhythm.     Heart sounds: No murmur. No friction rub. No gallop.   Pulmonary:     Effort: Pulmonary effort is normal. Tachypnea present. No respiratory distress.     Breath sounds: Normal breath sounds. No wheezing or rales.     Comments: On 4 L nasal cannula patient tachypneic but without labored breathing able to speak in full sentences, lungs clear to auscultation bilaterally. Chest:     Chest wall: No tenderness.  Abdominal:     General: Bowel sounds are normal. There is no distension.     Palpations: Abdomen is soft. There is no mass.     Tenderness: There is abdominal tenderness. There is no guarding.     Comments: Abdomen is soft and nondistended, bowel sounds are present throughout, patient does have focal epigastric tenderness without guarding or rebound, all other quadrants nontender to palpation.  Musculoskeletal:        General: No deformity.     Cervical back: Neck supple.     Right lower leg: No tenderness. No edema.     Left lower leg: No tenderness. No edema.     Comments: Bilateral lower extremities without edema  Skin:    General: Skin is warm and dry.     Capillary Refill: Capillary refill takes less than 2 seconds.  Neurological:      Mental Status: He is alert.     Coordination: Coordination normal.     Comments: Speech is clear, able to follow commands Moves extremities without ataxia, coordination intact  Psychiatric:        Mood and Affect: Mood normal.        Behavior: Behavior normal.     ED Results / Procedures / Treatments   Labs (all labs ordered are listed, but only abnormal results are displayed) Labs Reviewed  BASIC METABOLIC PANEL - Abnormal; Notable for the following components:      Result Value   CO2 21 (*)    BUN 21 (*)    All other components within normal limits  CBC - Abnormal; Notable for the following components:   WBC 16.8 (*)    Hemoglobin 10.6 (*)    HCT 33.6 (*)    MCV 69.7 (*)    MCH 22.0 (*)    RDW 16.6 (*)    Platelets 686 (*)    All other components within normal limits  HEPATIC FUNCTION PANEL  LIPASE, BLOOD  TROPONIN I (HIGH SENSITIVITY)    EKG None  Radiology DG Chest 2 View  Result Date: 03/07/2020 CLINICAL DATA:  Respiratory distress. Short of breath and cough. COVID positive test 2 weeks ago. EXAM: CHEST - 2 VIEW COMPARISON:  03/04/2020 FINDINGS: Bilateral patchy airspace lung opacities are without significant change from the most recent prior study. No new lung abnormalities. No convincing pleural effusion.  No pneumothorax. Cardiac silhouette is normal in size. IMPRESSION: 1. No significant change from the prior study. 2. Persistent bilateral airspace lung opacities consistent with multifocal pneumonia. Electronically Signed   By: Lajean Manes M.D.   On: 03/07/2020 10:22    Procedures Procedures (including critical care time)  Medications Ordered in ED Medications  sodium chloride flush (NS) 0.9 % injection 3 mL (has no administration in time range)  alum & mag hydroxide-simeth (MAALOX/MYLANTA) 200-200-20 MG/5ML suspension 30 mL (has no administration in time range)    And  lidocaine (XYLOCAINE) 2 % viscous mouth solution 15 mL (has no administration  in time  range)  pantoprazole (PROTONIX) injection 40 mg (has no administration in time range)    ED Course  I have reviewed the triage vital signs and the nursing notes.  Pertinent labs & imaging results that were available during my care of the patient were reviewed by me and considered in my medical decision making (see chart for details).    MDM Rules/Calculators/A&P                     59 year old male presents with shortness of breath and epigastric pain, had recent admission for Covid and was discharged home on supplemental oxygen.  While in the waiting room today patient with increasing oxygen requirement, on arrival he is tachypneic but all other vitals are normal.  He reports epigastric pain that radiates up into the chest, had recent evaluation for the same with normal ultrasound, and this pain was improved with GI cocktail but at this time patient was not complaining of chest pain.  With increasing oxygen requirement after recent Covid infection concern for potential PE will get CT angio of the chest, will also check basic labs, troponin, and CT abdomen pelvis.  Patient initially on 4 L of oxygen, titrated back down to 2 L and maintaining normal O2 sats but persistently tachypneic.  Lungs are clear on exam.  Mild epigastric tenderness noted.  Lab work shows leukocytosis, patient has been on a course of Decadron since discharge.  Stable hemoglobin, no significant electrolyte derangements, normal renal function.  Hepatic function and lipase pending.  Initial troponin is normal and EKG without significant ischemic changes.  Chest x-ray without significant changes from prior study, persistent bilateral airspace disease suggestive of multifocal pneumonia.  At shift change PE study and CT abdomen pelvis pending as well as hepatic function and lipase.  Care signed out to resident MD Nadeen Landau who will follow up on pending studies and disposition appropriately.  Final Clinical Impression(s) / ED  Diagnoses Final diagnoses:  Shortness of breath  Epigastric pain    Rx / DC Orders ED Discharge Orders    None       Janet Berlin 03/07/20 [id number]    Isla Pence, MD 03/19/20 1943

## 2020-03-07 NOTE — ED Notes (Signed)
Pt walked to front desk noted to be in resp distress. Pt sat 85% on 2LMP. PLaced in wheelchair and 02 increasesed to 4 LMP [place]. O2 sats now 99% on same and resp 26

## 2020-03-07 NOTE — ED Triage Notes (Signed)
Pt was recently discharged from hospital on 15th with covid/ pneumonia. C/o ongoing sob despite 2L supplemental O2 that is wears at baseline, as well as epigastric pain that his PCP placed him on meds for on 3/22.

## 2020-03-08 NOTE — ED Provider Notes (Signed)
  Physical Exam  BP 120/82 (BP Location: Right Arm)   Pulse 82   Temp 98 F (36.7 C) (Oral)   Resp 18   SpO2 98%   Physical Exam Vitals and nursing note reviewed.  Constitutional:      Appearance: Normal appearance. He is well-developed.  HENT:     Head: Normocephalic and atraumatic.     Nose: Nose normal. No congestion or rhinorrhea.     Mouth/Throat:     Mouth: Mucous membranes are moist.     Pharynx: Oropharynx is clear.  Eyes:     Extraocular Movements: Extraocular movements intact.     Pupils: Pupils are equal, round, and reactive to light.  Cardiovascular:     Rate and Rhythm: Normal rate and regular rhythm.  Pulmonary:     Effort: Pulmonary effort is normal. No respiratory distress.  Abdominal:     General: There is no distension.     Palpations: Abdomen is soft.     Tenderness: There is no abdominal tenderness.  Musculoskeletal:        General: Normal range of motion.     Cervical back: Normal range of motion and neck supple.  Skin:    General: Skin is warm and dry.     Capillary Refill: Capillary refill takes less than 2 seconds.  Neurological:     General: No focal deficit present.     Mental Status: He is alert and oriented to person, place, and time. Mental status is at baseline.  Psychiatric:        Mood and Affect: Mood normal.     ED Course/Procedures     Procedures  MDM   Assumed care from off going provider at 1500.  For details on ED course prior to handoff, please refer to previous team's note.  In brief, patient admitted with COVID-19 from 3/11-3/15.  He was discharged home on supplemental oxygen.  Patient presenting with shortness of breath and initial increased O2 requirement of 4 L.  Patient's O2 weaned back down to baseline of 2 L.  He is also noted to have abdominal tenderness in the epigastric area.  Previous team collected CT abdomen pelvis and CTA PE study.  Plan at time of handoff is to follow-up these images as well as pending lab work.   If work-up is normal, patient likely to be discharged home.  CT scan showed no signs of PE but is notable for multifocal pneumonia.  No acute findings on CT abdomen pelvis.  Lab work without acute findings or significant changes from previous.  On reassessment, patient says that he feels better.  He appears stable for discharge.  Patient in agreement with plan.  Provided strict return precautions.  Encouraged him to follow-up with his PCP in 2 to 3 days.  Patient assessed and evaluated with Dr. Zenia Resides.  Nadeen Landau, MD      Nadeen Landau, MD 03/08/20 [id number]    Isla Pence, MD 03/19/20 [phone]

## 2020-03-10 ENCOUNTER — Emergency Department (HOSPITAL_COMMUNITY): Payer: HRSA Program

## 2020-03-10 ENCOUNTER — Other Ambulatory Visit: Payer: Self-pay

## 2020-03-10 ENCOUNTER — Inpatient Hospital Stay (HOSPITAL_COMMUNITY)
Admission: EM | Admit: 2020-03-10 | Discharge: 2020-03-18 | DRG: 177 | Disposition: A | Discharge status: Home Health | Payer: HRSA Program | Attending: Internal Medicine | Admitting: Internal Medicine

## 2020-03-10 ENCOUNTER — Encounter (HOSPITAL_COMMUNITY): Payer: Self-pay | Admitting: *Deleted

## 2020-03-10 DIAGNOSIS — I10 Essential (primary) hypertension: Secondary | ICD-10-CM

## 2020-03-10 DIAGNOSIS — E860 Dehydration: Secondary | ICD-10-CM | POA: Diagnosis present

## 2020-03-10 DIAGNOSIS — M7501 Adhesive capsulitis of right shoulder: Secondary | ICD-10-CM

## 2020-03-10 DIAGNOSIS — N179 Acute kidney failure, unspecified: Secondary | ICD-10-CM | POA: Diagnosis present

## 2020-03-10 DIAGNOSIS — Z87891 Personal history of nicotine dependence: Secondary | ICD-10-CM | POA: Diagnosis not present

## 2020-03-10 DIAGNOSIS — J1282 Pneumonia due to coronavirus disease 2019: Secondary | ICD-10-CM | POA: Diagnosis present

## 2020-03-10 DIAGNOSIS — D563 Thalassemia minor: Secondary | ICD-10-CM | POA: Diagnosis present

## 2020-03-10 DIAGNOSIS — K5900 Constipation, unspecified: Secondary | ICD-10-CM | POA: Diagnosis not present

## 2020-03-10 DIAGNOSIS — E875 Hyperkalemia: Secondary | ICD-10-CM | POA: Diagnosis not present

## 2020-03-10 DIAGNOSIS — L740 Miliaria rubra: Secondary | ICD-10-CM | POA: Diagnosis not present

## 2020-03-10 DIAGNOSIS — J80 Acute respiratory distress syndrome: Secondary | ICD-10-CM | POA: Diagnosis present

## 2020-03-10 DIAGNOSIS — K219 Gastro-esophageal reflux disease without esophagitis: Secondary | ICD-10-CM | POA: Diagnosis present

## 2020-03-10 DIAGNOSIS — J159 Unspecified bacterial pneumonia: Secondary | ICD-10-CM | POA: Diagnosis present

## 2020-03-10 DIAGNOSIS — Z79899 Other long term (current) drug therapy: Secondary | ICD-10-CM

## 2020-03-10 DIAGNOSIS — I5032 Chronic diastolic (congestive) heart failure: Secondary | ICD-10-CM | POA: Diagnosis present

## 2020-03-10 DIAGNOSIS — Z6830 Body mass index (BMI) 30.0-30.9, adult: Secondary | ICD-10-CM

## 2020-03-10 DIAGNOSIS — U071 COVID-19: Secondary | ICD-10-CM | POA: Diagnosis present

## 2020-03-10 DIAGNOSIS — E669 Obesity, unspecified: Secondary | ICD-10-CM | POA: Diagnosis present

## 2020-03-10 DIAGNOSIS — R0603 Acute respiratory distress: Secondary | ICD-10-CM | POA: Diagnosis present

## 2020-03-10 DIAGNOSIS — R1013 Epigastric pain: Secondary | ICD-10-CM | POA: Diagnosis present

## 2020-03-10 DIAGNOSIS — I110 Hypertensive heart disease with heart failure: Secondary | ICD-10-CM | POA: Diagnosis present

## 2020-03-10 DIAGNOSIS — D539 Nutritional anemia, unspecified: Secondary | ICD-10-CM | POA: Diagnosis present

## 2020-03-10 LAB — CBC
HCT: 31.3 % — ABNORMAL LOW (ref 39.0–52.0)
Hemoglobin: 10.1 g/dL — ABNORMAL LOW (ref 13.0–17.0)
MCH: 22.3 pg — ABNORMAL LOW (ref 26.0–34.0)
MCHC: 32.3 g/dL (ref 30.0–36.0)
MCV: 69.1 fL — ABNORMAL LOW (ref 80.0–100.0)
Platelets: 620 10*3/uL — ABNORMAL HIGH (ref 150–400)
RBC: 4.53 MIL/uL (ref 4.22–5.81)
RDW: 16.7 % — ABNORMAL HIGH (ref 11.5–15.5)
WBC: 30.2 10*3/uL — ABNORMAL HIGH (ref 4.0–10.5)
nRBC: 0 % (ref 0.0–0.2)

## 2020-03-10 LAB — BASIC METABOLIC PANEL
Anion gap: 12 (ref 5–15)
BUN: 23 mg/dL — ABNORMAL HIGH (ref 6–20)
CO2: 19 mmol/L — ABNORMAL LOW (ref 22–32)
Calcium: 8.6 mg/dL — ABNORMAL LOW (ref 8.9–10.3)
Chloride: 101 mmol/L (ref 98–111)
Creatinine, Ser: 1.42 mg/dL — ABNORMAL HIGH (ref 0.61–1.24)
GFR calc Af Amer: >60 mL/min (ref >60)
GFR calc non Af Amer: 54 mL/min — ABNORMAL LOW (ref >60)
Glucose, Bld: 158 mg/dL — ABNORMAL HIGH (ref 70–99)
Potassium: 4.2 mmol/L (ref 3.5–5.1)
Sodium: 132 mmol/L — ABNORMAL LOW (ref 135–145)

## 2020-03-10 LAB — HEPATIC FUNCTION PANEL
ALT: 56 U/L — ABNORMAL HIGH (ref 0–44)
AST: 54 U/L — ABNORMAL HIGH (ref 15–41)
Albumin: 2.3 g/dL — ABNORMAL LOW (ref 3.5–5.0)
Alkaline Phosphatase: 294 U/L — ABNORMAL HIGH (ref 38–126)
Bilirubin, Direct: 1.2 mg/dL — ABNORMAL HIGH (ref 0.0–0.2)
Indirect Bilirubin: 1 mg/dL — ABNORMAL HIGH (ref 0.3–0.9)
Total Bilirubin: 2.2 mg/dL — ABNORMAL HIGH (ref 0.3–1.2)
Total Protein: 7.6 g/dL (ref 6.5–8.1)

## 2020-03-10 LAB — TROPONIN I (HIGH SENSITIVITY)
Troponin I (High Sensitivity): 23 ng/L — ABNORMAL HIGH (ref <18)
Troponin I (High Sensitivity): 31 ng/L — ABNORMAL HIGH (ref <18)

## 2020-03-10 LAB — BRAIN NATRIURETIC PEPTIDE: B Natriuretic Peptide: 67 pg/mL (ref 0.0–100.0)

## 2020-03-10 IMAGING — CT CT ABD-PELV W/ CM
2 of 11 series · 12 of 46 positions shown, 17 images · IV contrast (APPLIED)
Comparison: [DATE]

CLINICAL DATA: Chest pain and abdominal pain, history of [JV]
positivity

EXAM:
CT ANGIOGRAPHY CHEST
CT ABDOMEN AND PELVIS WITH CONTRAST
TECHNIQUE: Multidetector CT imaging of the chest was performed using the
standard protocol during bolus administration of intravenous
contrast. Multiplanar CT image reconstructions and MIPs were
obtained to evaluate the vascular anatomy. Multidetector CT imaging
of the abdomen and pelvis was performed using the standard protocol
during bolus administration of intravenous contrast.
CONTRAST:  100mL OMNIPAQUE IOHEXOL 350 MG/ML SOLN

[Series 8: coronal mpr · coronal · 0.49mm/px · 1 of 87 slices shown, 2 images]
[im 44/87  soft-tissue]
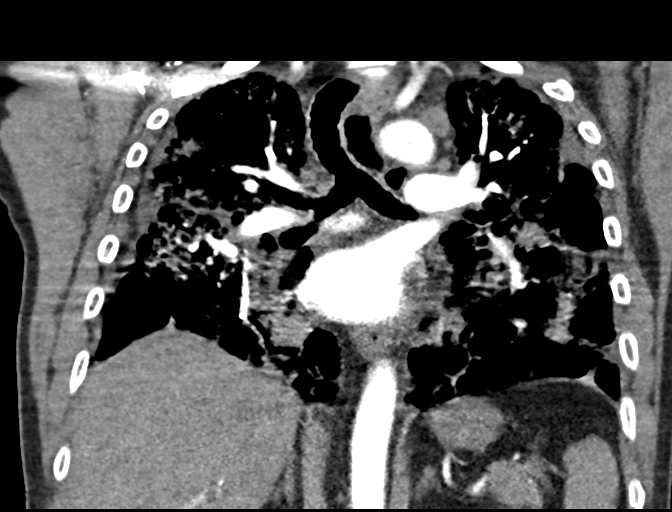
[im 44/87  bone]
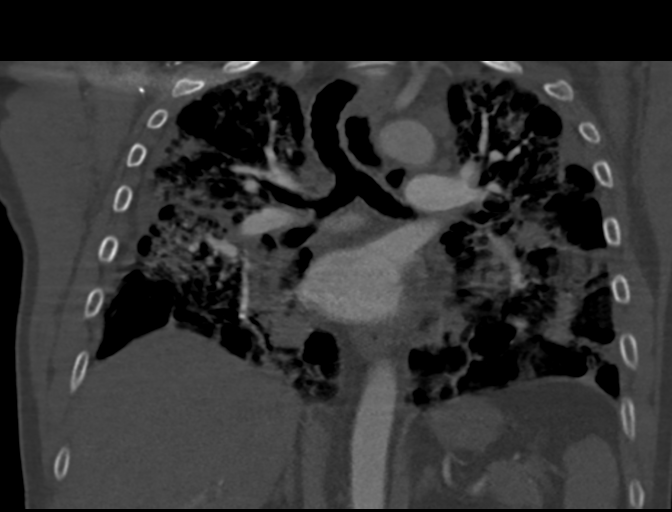

[Series 13: thins · axial · 0.70mm/px · z∈[+852,+1243]mm · 11 of 460 slices shown, 15 images]
[im 46/460  soft-tissue]
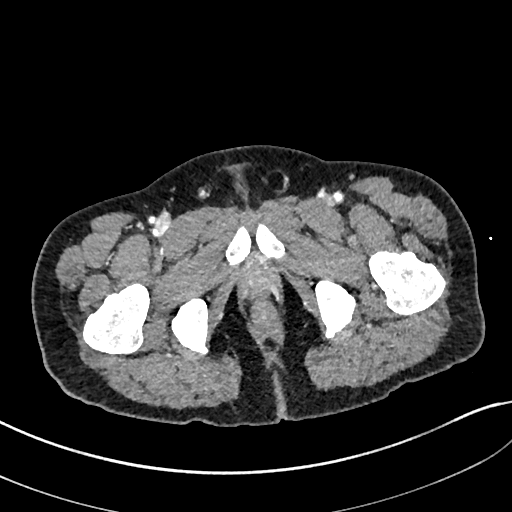
[im 46/460  bone]
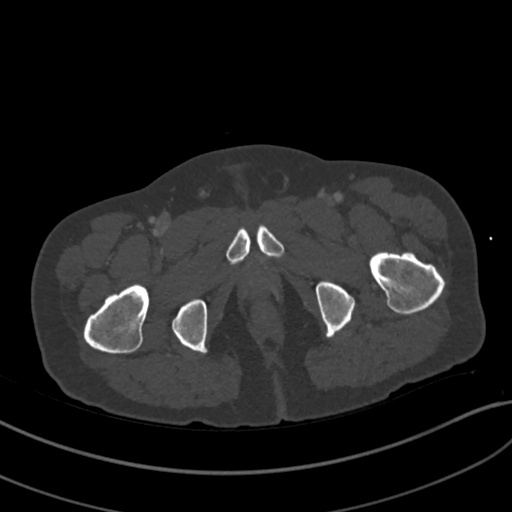
[im 92/460  soft-tissue]
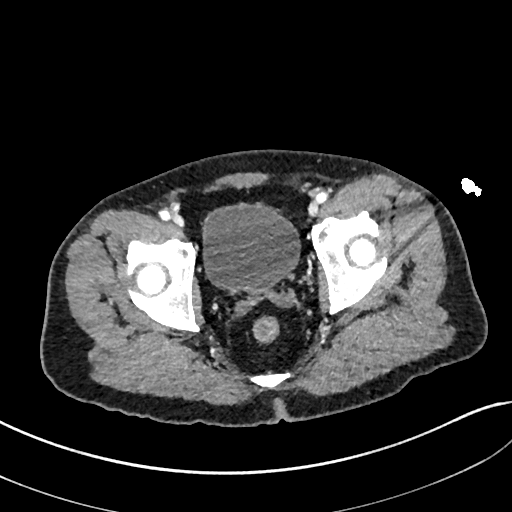
[im 138/460  soft-tissue]
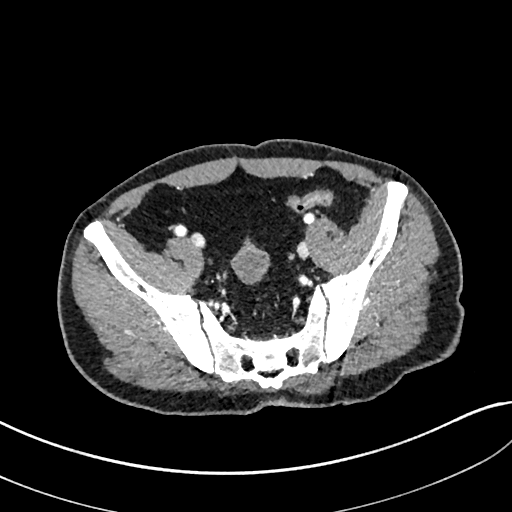
[im 184/460  soft-tissue]
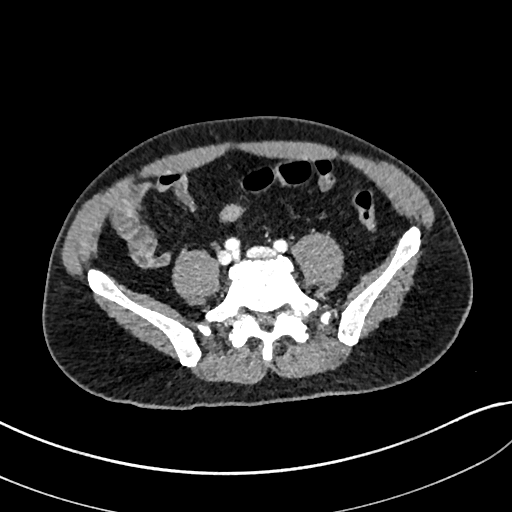
[im 230/460  soft-tissue]
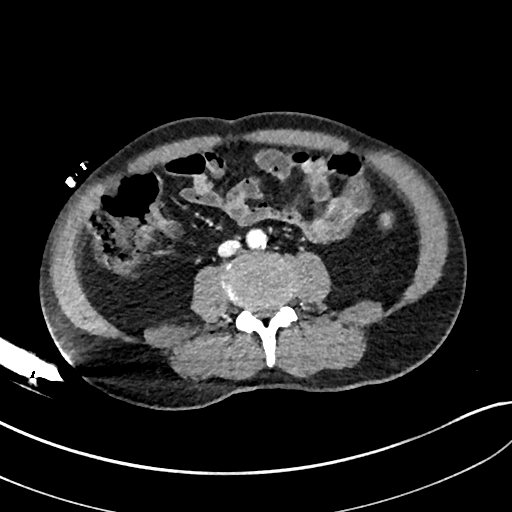
[im 276/460  soft-tissue]
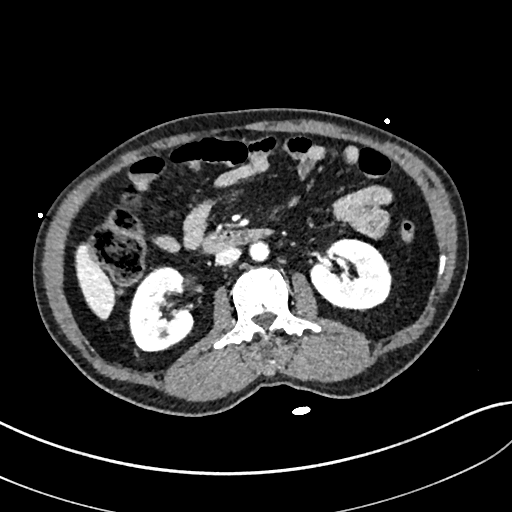
[im 322/460  soft-tissue]
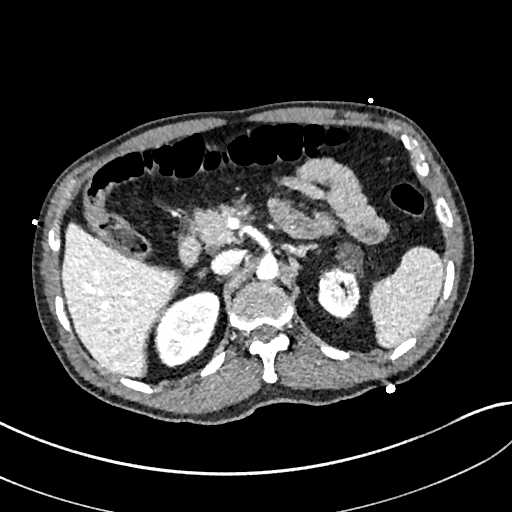
[im 368/460  soft-tissue]
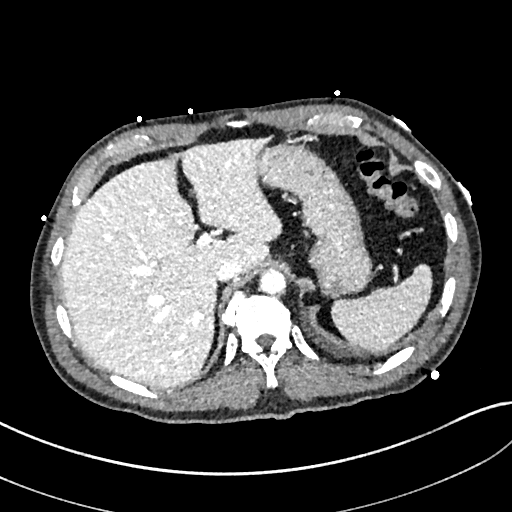
[im 368/460  lung]
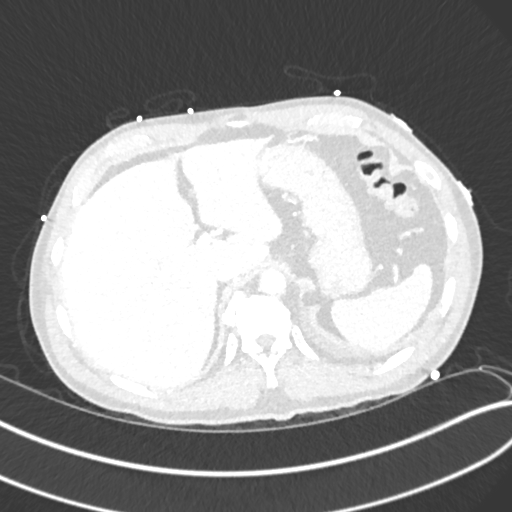
[im 391/460  lung]
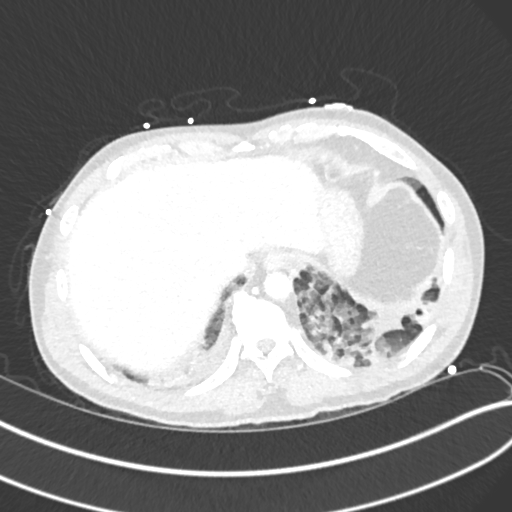
[im 414/460  soft-tissue]
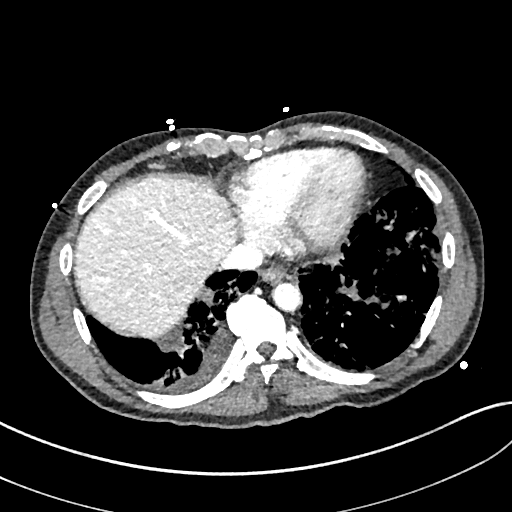
[im 414/460  lung]
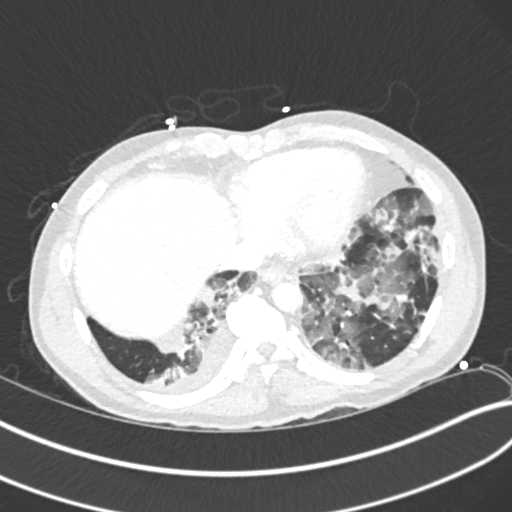
[im 414/460  bone]
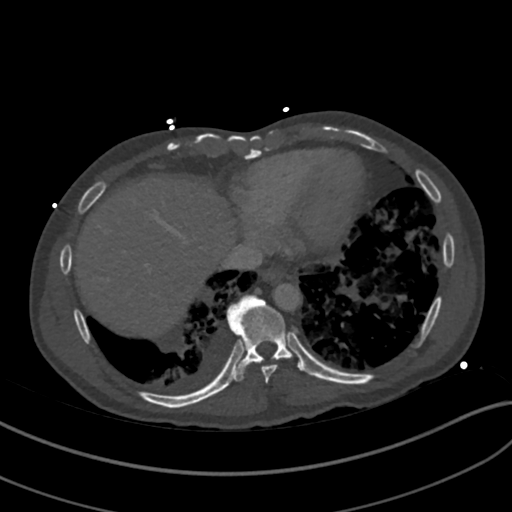
[im 437/460  lung]
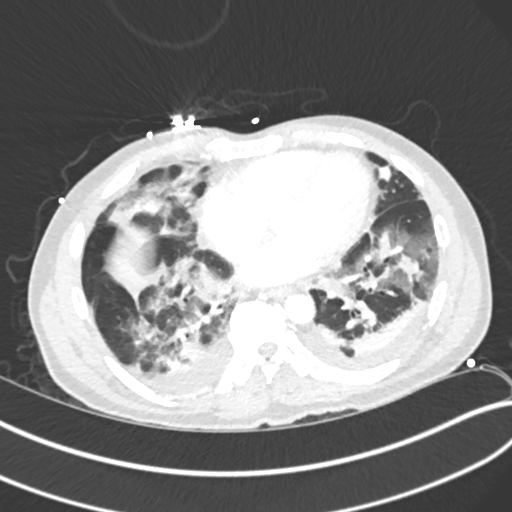

[12 of 46 positions shown; findings below may reference images not displayed]

FINDINGS: CTA CHEST FINDINGS

Cardiovascular: Thoracic aorta is within normal limits. No
significant atherosclerotic calcifications are seen. No cardiac
enlargement is noted. The pulmonary artery shows a normal branching
pattern. No filling defects to suggest pulmonary emboli are noted.
The overall appearance is stable from the prior exam.

Mediastinum/Nodes: Thoracic inlet is within normal limits. No
sizable hilar or mediastinal adenopathy is noted. The esophagus as
visualized is within normal limits.

Lungs/Pleura: Patchy ground-glass opacities are again identified
throughout both lungs consistent with the given clinical history of
[JV] positivity and multifocal pneumonia. These have progressed
from the prior exam particularly in the left lower lobe. Small
pleural effusions are noted right greater than left which are new
from the prior study.

Musculoskeletal: Degenerative changes of the thoracic spine are
again seen. No acute rib abnormality is noted.

Review of the MIP images confirms the above findings.

CT ABDOMEN and PELVIS FINDINGS

Hepatobiliary: No focal liver abnormality is seen. No gallstones,
gallbladder wall thickening, or biliary dilatation.

Pancreas: Unremarkable. No pancreatic ductal dilatation or
surrounding inflammatory changes.

Spleen: Normal in size without focal abnormality.

Adrenals/Urinary Tract: Adrenal glands are within normal limits.
Kidneys are well visualized bilaterally. No renal calculi or
obstructive changes are seen. Normal excretion is noted on delayed
images. Multiple bilateral cysts are again noted and stable. The
bladder is partially distended.

Stomach/Bowel: The appendix shows no inflammatory changes to suggest
appendicitis. Small appendicoliths are noted within. The colon shows
no obstructive or inflammatory changes. Small bowel and stomach
appear within normal limits.

Vascular/Lymphatic: Aortic atherosclerosis. No enlarged abdominal or
pelvic lymph nodes.

Reproductive: Prostate is unremarkable.

Other: No abdominal wall hernia or abnormality. No abdominopelvic
ascites.

Musculoskeletal: Degenerative changes of lumbar spine are noted.

Review of the MIP images confirms the above findings.
IMPRESSION: CTA of the chest: No evidence of acute pulmonary emboli.

Progressive bilateral infiltrates consistent with multifocal
pneumonia related to the patient's known [JV] positivity

New small bilateral pleural effusions right greater than left.

CT of the abdomen and pelvis: Chronic changes without acute
abnormality.

## 2020-03-10 IMAGING — DX DG CHEST 1V PORT
1 series · 1 of 1 positions shown · non-contrast
Comparison: [DATE]

CLINICAL DATA: Short of breath, hypoxia, history of [C0]
pneumonia

EXAM:
PORTABLE CHEST 1 VIEW

[chest ap]
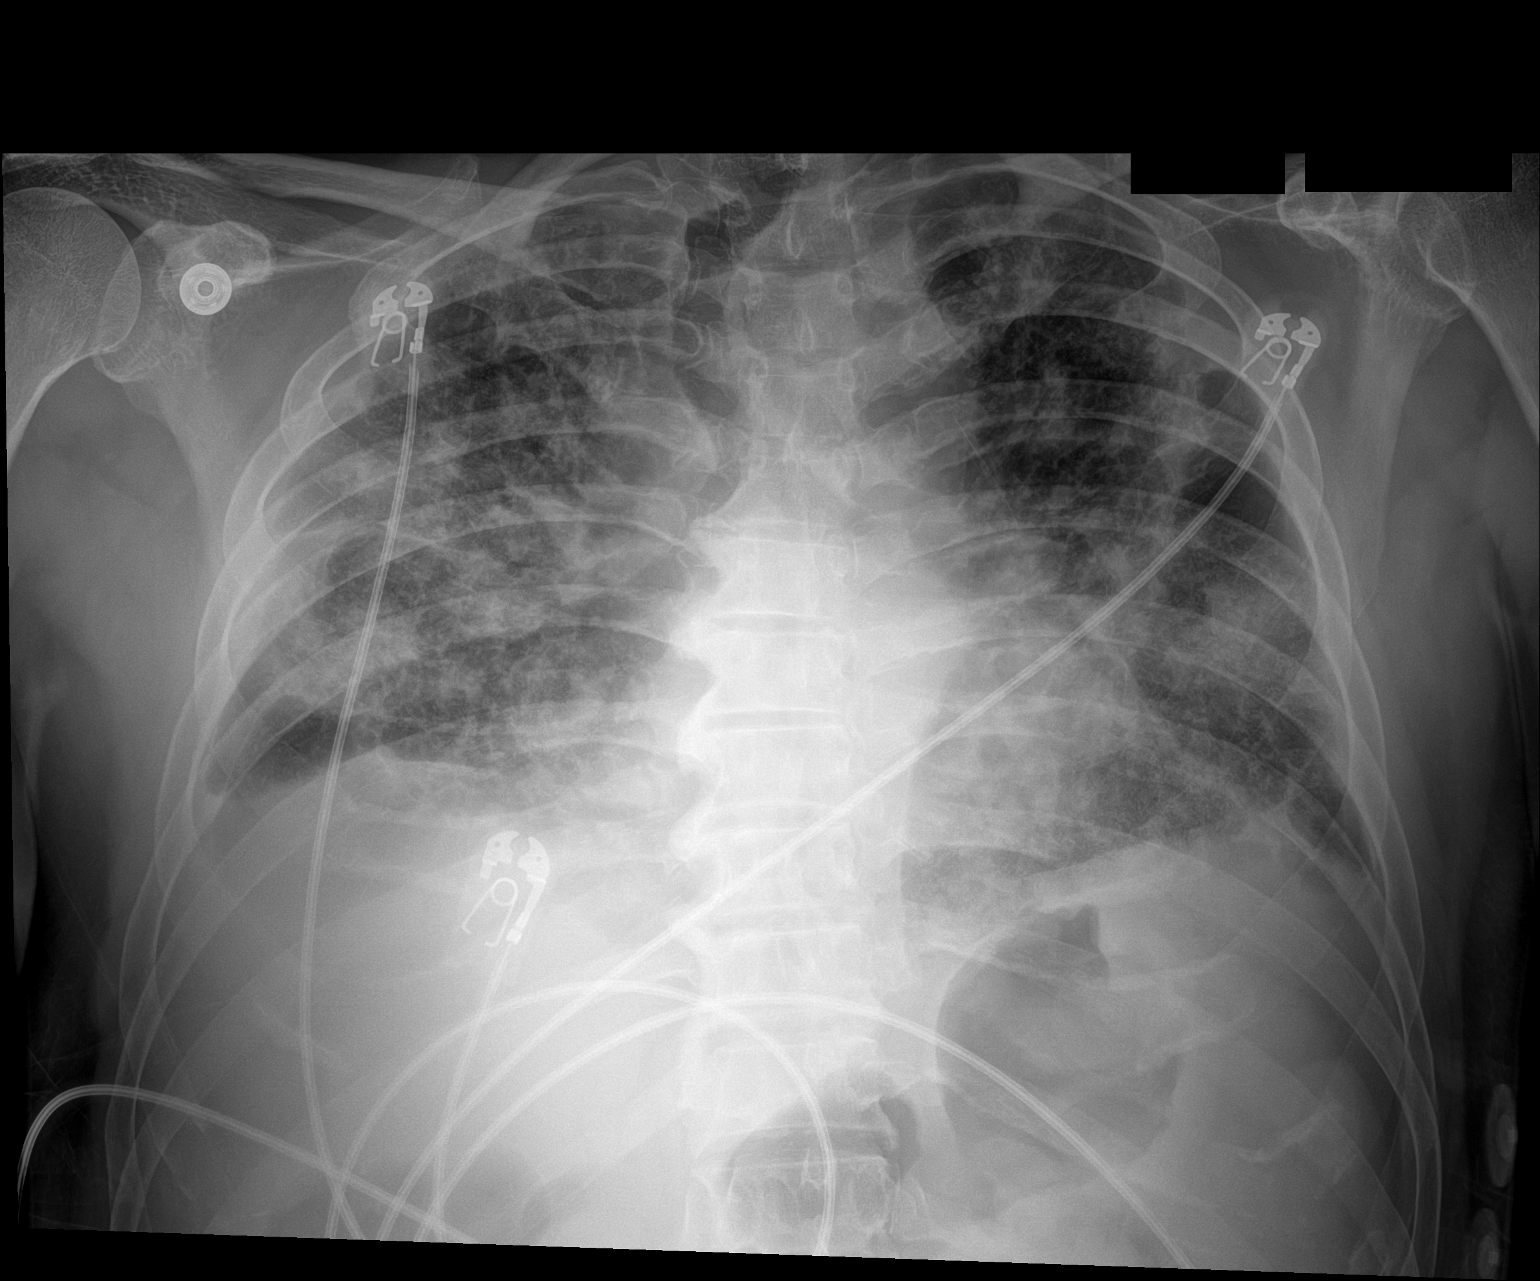

[1 of 1 positions shown; findings below may reference images not displayed]

FINDINGS: Single frontal view of the chest demonstrates progressive bilateral
multifocal pneumonia most pronounced at the left lung base. Lung
volumes are diminished. There are trace bilateral pleural effusions.
No pneumothorax. Cardiac silhouette is unremarkable.
IMPRESSION: 1. Progression of bilateral multifocal pneumonia, most pronounced at
the left lung base.
2. Trace bilateral pleural effusions.

## 2020-03-10 MED ORDER — PANTOPRAZOLE SODIUM 40 MG PO TBEC
40.0000 mg | DELAYED_RELEASE_TABLET | Freq: Two times a day (BID) | ORAL | Status: DC
  Administered 2020-03-11 – 2020-03-18 (×15): 40 mg via ORAL
  Filled 2020-03-10 (×15): qty 1

## 2020-03-10 MED ORDER — ACETAMINOPHEN 325 MG PO TABS
650.0000 mg | ORAL_TABLET | Freq: Four times a day (QID) | ORAL | Status: DC | PRN
  Administered 2020-03-11 – 2020-03-18 (×7): 650 mg via ORAL
  Filled 2020-03-10 (×8): qty 2

## 2020-03-10 MED ORDER — SODIUM CHLORIDE 0.9 % IV SOLN
500.0000 mg | Freq: Once | INTRAVENOUS | Status: AC
  Administered 2020-03-10: 500 mg via INTRAVENOUS
  Filled 2020-03-10: qty 500

## 2020-03-10 MED ORDER — SODIUM CHLORIDE 0.9% FLUSH
3.0000 mL | Freq: Once | INTRAVENOUS | Status: AC
  Administered 2020-03-11: 3 mL via INTRAVENOUS

## 2020-03-10 MED ORDER — GUAIFENESIN-DM 100-10 MG/5ML PO SYRP
15.0000 mL | ORAL_SOLUTION | Freq: Four times a day (QID) | ORAL | Status: DC
  Administered 2020-03-11 – 2020-03-18 (×29): 15 mL via ORAL
  Filled 2020-03-10 (×30): qty 15

## 2020-03-10 MED ORDER — ENOXAPARIN SODIUM 40 MG/0.4ML ~~LOC~~ SOLN
40.0000 mg | SUBCUTANEOUS | Status: DC
  Administered 2020-03-11: 40 mg via SUBCUTANEOUS
  Filled 2020-03-10: qty 0.4

## 2020-03-10 MED ORDER — GABAPENTIN 300 MG PO CAPS
600.0000 mg | ORAL_CAPSULE | Freq: Three times a day (TID) | ORAL | Status: DC
  Administered 2020-03-11 – 2020-03-18 (×23): 600 mg via ORAL
  Filled 2020-03-10 (×23): qty 2

## 2020-03-10 MED ORDER — SENNOSIDES-DOCUSATE SODIUM 8.6-50 MG PO TABS: 1.0000 | ORAL_TABLET | Freq: Every evening | ORAL | Status: DC | PRN

## 2020-03-10 MED ORDER — BENZONATATE 100 MG PO CAPS
100.0000 mg | ORAL_CAPSULE | Freq: Three times a day (TID) | ORAL | Status: DC
  Administered 2020-03-11 – 2020-03-18 (×23): 100 mg via ORAL
  Filled 2020-03-10 (×23): qty 1

## 2020-03-10 MED ORDER — IOHEXOL 350 MG/ML SOLN
100.0000 mL | Freq: Once | INTRAVENOUS | Status: AC | PRN
  Administered 2020-03-10: 100 mL via INTRAVENOUS

## 2020-03-10 MED ORDER — PROMETHAZINE HCL 25 MG PO TABS
12.5000 mg | ORAL_TABLET | Freq: Four times a day (QID) | ORAL | Status: DC | PRN
  Administered 2020-03-11: 09:00:00 12.5 mg via ORAL
  Filled 2020-03-10: qty 1

## 2020-03-10 MED ORDER — SODIUM CHLORIDE 0.9 % IV SOLN
1.0000 g | Freq: Once | INTRAVENOUS | Status: AC
  Administered 2020-03-10: 1 g via INTRAVENOUS
  Filled 2020-03-10: qty 10

## 2020-03-10 NOTE — ED Provider Notes (Signed)
[facility] Provider Note   CSN: [id number] Arrival date & time: 03/10/20  1941     History Chief Complaint  Patient presents with  . Chest Pain  . Shortness of Breath    Eric Ingram is a 59 y.o. male past medical Struve anemia, CHF, GERD, hypertension with recent COVID-19 infection on 02/22/2020 who presents for evaluation of chest pain and shortness of breath.  Patient was initially diagnosed with Covid on 02/22/2020 and is admitted to the hospital.  He was discharged with supplemental oxygen (2 L).  He came into the ED on 03/04/2020 for epigastric abdominal pain.  Work-up was unremarkable at that time.  He came additionally on 03/07/20 for chest pain, shortness of breath.  At that time, he dropped down to 88% on 2 L O2.  He was bumped up to 4 L which put his oxygen sats normal.  He was eventually weaned off and his work-up including a CTA for evaluation of PE was negative.  He was discharged home.  He comes back in today with worsening shortness of breath and chest pain.  Daughter thinks that it began earlier today but is unsure of the timeline.  He reports some left-sided chest pain that goes into the center of his chest and makes him feel tired.  He also reports worsening shortness of breath.  He has been using his oxygen and did have to bump it up to 4 L.  He has continued to have some cough.  She has not noted any fever.  He has had some intermittent vomiting but did not vomit today.  He also reports some epigastric abdominal pain.  Denies any leg swelling.  The history is provided by the patient. No language interpreter was used (Family member was used.).       Past Medical History:  Diagnosis Date  . Anemia   . CHF (congestive heart failure) ([place])   . Dental infection 08/02/2018  . GERD (gastroesophageal reflux disease)   . Hypertension   . Shingles rash 07/07/2019  . Thalassemia, alpha ([place])   . Viral gastritis 02/08/2019    Patient Active  Problem List   Diagnosis Date Noted  . Acute respiratory distress 03/10/2020  . Acute respiratory failure with hypoxia ([place])   . Pneumonia due to COVID-19 virus 02/22/2020  . Viral URI 10/10/2019  . Cervical radiculopathy 09/12/2019  . Post herpetic neuralgia 08/29/2019  . EKG abnormalities 08/29/2019  . Adhesive capsulitis of both shoulders 08/29/2019  . Gastro-esophageal reflux 07/18/2019  . Wheezing on both sides of chest 02/16/2019  . Tachycardia 02/08/2019  . Cough 02/08/2019  . Constipation 02/08/2019  . Shoulder pain, left 02/08/2019  . Abdominal bloating 11/07/2018  . Acute on chronic diastolic heart failure ([place])   . Iron deficiency anemia due to chronic blood loss   . Chronic sinusitis 01/05/2018  . Health care maintenance 01/05/2018  . Hypertension 10/16/2017    Past Surgical History:  Procedure Laterality Date  . NO PAST SURGERIES         Family History  Problem Relation Age of Onset  . Colon cancer Neg Hx   . Stomach cancer Neg Hx     Social History   Tobacco Use  . Smoking status: Former Smoker    Quit date: 12/15/1999    Years since quitting: 20.2  . Smokeless tobacco: Never Used  Substance Use Topics  . Alcohol use: No    Comment: former  . Drug use: No  Home Medications Prior to Admission medications   Medication Sig Start Date End Date Taking? Authorizing Provider  albuterol (VENTOLIN HFA) 108 (90 Base) MCG/ACT inhaler Inhale 1-2 puffs into the lungs every 6 (six) hours as needed for wheezing or shortness of breath. 03/04/20   Joy, Shawn C, PA-C  amLODipine (NORVASC) 2.5 MG tablet Take 2.5 mg by mouth daily. 01/09/20   [provider]  benzonatate (TESSALON) 100 MG capsule Take 1 capsule (100 mg total) by mouth 3 (three) times daily. 02/26/20   Katherine Roan, MD  famotidine (PEPCID) 20 MG tablet Take 1 tablet (20 mg total) by mouth 2 (two) times daily for 5 days. 03/04/20 03/09/20  Joy, Shawn C, PA-C  fluticasone (FLONASE) 50 MCG/ACT  nasal spray Place 2 sprays into both nostrils daily. 10/11/18 03/07/20  Kathi Ludwig, MD  gabapentin (NEURONTIN) 300 MG capsule TAKE 2 CAPSULES BY MOUTH THREE TIMES DAILY. TAKE UP TO 2 CAPSULES IN THE MORNING, AFTERNOON, AND AT BEDTIME Patient taking differently: Take 600 mg by mouth 3 (three) times daily.  12/22/19   Axel Filler, MD  guaiFENesin-dextromethorphan (ROBITUSSIN DM) 100-10 MG/5ML syrup Take 15 mLs by mouth every 6 (six) hours for 15 days. 02/26/20 03/12/20  Katherine Roan, MD  lisinopril (ZESTRIL) 40 MG tablet Take 1 tablet (40 mg total) by mouth daily. 02/09/20   Al Decant, MD  metoprolol succinate (TOPROL-XL) 25 MG 24 hr tablet Take 25 mg by mouth daily. 02/02/20   [provider]  naproxen (NAPROSYN) 500 MG tablet Take 1 tablet (500 mg total) by mouth 2 (two) times daily as needed. Patient not taking: Reported on 02/22/2020 02/09/20 02/08/21  Al Decant, MD  pantoprazole (PROTONIX) 40 MG tablet Take 1 tablet (40 mg total) by mouth 2 (two) times daily. 02/09/20   Al Decant, MD  Phenyleph-Doxylamine-DM-APAP (NYQUIL SEVERE COLD/FLU) 5-6.25-10-325 MG/15ML LIQD Take 30 mLs by mouth every 6 (six) hours as needed (cold).    [provider]  sucralfate (CARAFATE) 1 GM/10ML suspension Take 10 mLs (1 g total) by mouth 4 (four) times daily -  with meals and at bedtime. 03/04/20   Joy, Helane Gunther, PA-C    Allergies    Patient has no known allergies.  Review of Systems   Review of Systems  Unable to perform ROS: Acuity of condition    Physical Exam Updated Vital Signs BP (!) 85/60   Pulse 90   Temp (!) 97.5 F (36.4 C) (Oral)   Resp (!) 33   Ht _0  (1.575 m)   Wt 74.8 kg   SpO2 100%   BMI 30.18 kg/m   Physical Exam Vitals and nursing note reviewed.  Constitutional:      Appearance: He is ill-appearing.  HENT:     Head: Normocephalic and atraumatic.  Eyes:     General: Lids are normal.     Conjunctiva/sclera: Conjunctivae normal.      Pupils: Pupils are equal, round, and reactive to light.  Cardiovascular:     Rate and Rhythm: Normal rate and regular rhythm.     Pulses: Normal pulses.     Heart sounds: Normal heart sounds. No murmur. No friction rub. No gallop.   Pulmonary:     Effort: Tachypnea and accessory muscle usage present.     Comments: Tachypnea, increased work of breathing with accessory muscle use is noted.  Crackles noted to bilateral lung fields almost to the top.  Only able to speak a few words at a time.  Abdominal:     Palpations: Abdomen is soft. Abdomen is not rigid.     Tenderness: There is no abdominal tenderness. There is no guarding.     Comments: Abdomen is soft, nondistended.  Tenderness palpation in epigastric region.  No rigidity, guarding.  Musculoskeletal:        General: Normal range of motion.     Cervical back: Full passive range of motion without pain.     Comments: Bilateral lower extremities are symmetric in appearance without any overlying warmth, erythema, edema.  Skin:    General: Skin is warm and dry.     Capillary Refill: Capillary refill takes less than 2 seconds.  Neurological:     Mental Status: He is oriented to person, place, and time.  Psychiatric:        Speech: Speech normal.     ED Results / Procedures / Treatments   Labs (all labs ordered are listed, but only abnormal results are displayed) Labs Reviewed  BASIC METABOLIC PANEL - Abnormal; Notable for the following components:      Result Value   Sodium 132 (*)    CO2 19 (*)    Glucose, Bld 158 (*)    BUN 23 (*)    Creatinine, Ser 1.42 (*)    Calcium 8.6 (*)    GFR calc non Af Amer 54 (*)    All other components within normal limits  CBC - Abnormal; Notable for the following components:   WBC 30.2 (*)    Hemoglobin 10.1 (*)    HCT 31.3 (*)    MCV 69.1 (*)    MCH 22.3 (*)    RDW 16.7 (*)    Platelets 620 (*)    All other components within normal limits  HEPATIC FUNCTION PANEL - Abnormal; Notable for  the following components:   Albumin 2.3 (*)    AST 54 (*)    ALT 56 (*)    Alkaline Phosphatase 294 (*)    Total Bilirubin 2.2 (*)    Bilirubin, Direct 1.2 (*)    Indirect Bilirubin 1.0 (*)    All other components within normal limits  TROPONIN I (HIGH SENSITIVITY) - Abnormal; Notable for the following components:   Troponin I (High Sensitivity) 23 (*)    All other components within normal limits  TROPONIN I (HIGH SENSITIVITY) - Abnormal; Notable for the following components:   Troponin I (High Sensitivity) 31 (*)    All other components within normal limits  CULTURE, BLOOD (ROUTINE X 2)  CULTURE, BLOOD (ROUTINE X 2)  BRAIN NATRIURETIC PEPTIDE  SODIUM, URINE, RANDOM  CREATININE, URINE, RANDOM  CBC WITH DIFFERENTIAL/PLATELET  C-REACTIVE PROTEIN  FERRITIN  COMPREHENSIVE METABOLIC PANEL  D-DIMER, QUANTITATIVE (NOT AT [facility])  PROCALCITONIN    EKG None  Radiology CT Angio Chest PE W and/or Wo Contrast  Result Date: 03/10/2020 CLINICAL DATA:  Chest pain and abdominal pain, history of COVID-19 positivity EXAM: CT ANGIOGRAPHY CHEST CT ABDOMEN AND PELVIS WITH CONTRAST TECHNIQUE: Multidetector CT imaging of the chest was performed using the standard protocol during bolus administration of intravenous contrast. Multiplanar CT image reconstructions and MIPs were obtained to evaluate the vascular anatomy. Multidetector CT imaging of the abdomen and pelvis was performed using the standard protocol during bolus administration of intravenous contrast. CONTRAST:  140m OMNIPAQUE IOHEXOL 350 MG/ML SOLN COMPARISON:  03/07/2020 FINDINGS: CTA CHEST FINDINGS Cardiovascular: Thoracic aorta is within normal limits. No significant atherosclerotic calcifications are seen. No cardiac enlargement is noted. The pulmonary artery  shows a normal branching pattern. No filling defects to suggest pulmonary emboli are noted. The overall appearance is stable from the prior exam. Mediastinum/Nodes: Thoracic inlet is  within normal limits. No sizable hilar or mediastinal adenopathy is noted. The esophagus as visualized is within normal limits. Lungs/Pleura: Patchy ground-glass opacities are again identified throughout both lungs consistent with the given clinical history of COVID-19 positivity and multifocal pneumonia. These have progressed from the prior exam particularly in the left lower lobe. Small pleural effusions are noted right greater than left which are new from the prior study. Musculoskeletal: Degenerative changes of the thoracic spine are again seen. No acute rib abnormality is noted. Review of the MIP images confirms the above findings. CT ABDOMEN and PELVIS FINDINGS Hepatobiliary: No focal liver abnormality is seen. No gallstones, gallbladder wall thickening, or biliary dilatation. Pancreas: Unremarkable. No pancreatic ductal dilatation or surrounding inflammatory changes. Spleen: Normal in size without focal abnormality. Adrenals/Urinary Tract: Adrenal glands are within normal limits. Kidneys are well visualized bilaterally. No renal calculi or obstructive changes are seen. Normal excretion is noted on delayed images. Multiple bilateral cysts are again noted and stable. The bladder is partially distended. Stomach/Bowel: The appendix shows no inflammatory changes to suggest appendicitis. Small appendicoliths are noted within. The colon shows no obstructive or inflammatory changes. Small bowel and stomach appear within normal limits. Vascular/Lymphatic: Aortic atherosclerosis. No enlarged abdominal or pelvic lymph nodes. Reproductive: Prostate is unremarkable. Other: No abdominal wall hernia or abnormality. No abdominopelvic ascites. Musculoskeletal: Degenerative changes of lumbar spine are noted. Review of the MIP images confirms the above findings. IMPRESSION: CTA of the chest: No evidence of acute pulmonary emboli. Progressive bilateral infiltrates consistent with multifocal pneumonia related to the patient's  known COVID-19 positivity New small bilateral pleural effusions right greater than left. CT of the abdomen and pelvis: Chronic changes without acute abnormality. Electronically Signed   By: Inez Catalina M.D.   On: 03/10/2020 22:44   CT ABDOMEN PELVIS W CONTRAST  Result Date: 03/10/2020 CLINICAL DATA:  Chest pain and abdominal pain, history of COVID-19 positivity EXAM: CT ANGIOGRAPHY CHEST CT ABDOMEN AND PELVIS WITH CONTRAST TECHNIQUE: Multidetector CT imaging of the chest was performed using the standard protocol during bolus administration of intravenous contrast. Multiplanar CT image reconstructions and MIPs were obtained to evaluate the vascular anatomy. Multidetector CT imaging of the abdomen and pelvis was performed using the standard protocol during bolus administration of intravenous contrast. CONTRAST:  149m OMNIPAQUE IOHEXOL 350 MG/ML SOLN COMPARISON:  03/07/2020 FINDINGS: CTA CHEST FINDINGS Cardiovascular: Thoracic aorta is within normal limits. No significant atherosclerotic calcifications are seen. No cardiac enlargement is noted. The pulmonary artery shows a normal branching pattern. No filling defects to suggest pulmonary emboli are noted. The overall appearance is stable from the prior exam. Mediastinum/Nodes: Thoracic inlet is within normal limits. No sizable hilar or mediastinal adenopathy is noted. The esophagus as visualized is within normal limits. Lungs/Pleura: Patchy ground-glass opacities are again identified throughout both lungs consistent with the given clinical history of COVID-19 positivity and multifocal pneumonia. These have progressed from the prior exam particularly in the left lower lobe. Small pleural effusions are noted right greater than left which are new from the prior study. Musculoskeletal: Degenerative changes of the thoracic spine are again seen. No acute rib abnormality is noted. Review of the MIP images confirms the above findings. CT ABDOMEN and PELVIS FINDINGS  Hepatobiliary: No focal liver abnormality is seen. No gallstones, gallbladder wall thickening, or biliary dilatation. Pancreas: Unremarkable. No  pancreatic ductal dilatation or surrounding inflammatory changes. Spleen: Normal in size without focal abnormality. Adrenals/Urinary Tract: Adrenal glands are within normal limits. Kidneys are well visualized bilaterally. No renal calculi or obstructive changes are seen. Normal excretion is noted on delayed images. Multiple bilateral cysts are again noted and stable. The bladder is partially distended. Stomach/Bowel: The appendix shows no inflammatory changes to suggest appendicitis. Small appendicoliths are noted within. The colon shows no obstructive or inflammatory changes. Small bowel and stomach appear within normal limits. Vascular/Lymphatic: Aortic atherosclerosis. No enlarged abdominal or pelvic lymph nodes. Reproductive: Prostate is unremarkable. Other: No abdominal wall hernia or abnormality. No abdominopelvic ascites. Musculoskeletal: Degenerative changes of lumbar spine are noted. Review of the MIP images confirms the above findings. IMPRESSION: CTA of the chest: No evidence of acute pulmonary emboli. Progressive bilateral infiltrates consistent with multifocal pneumonia related to the patient's known COVID-19 positivity New small bilateral pleural effusions right greater than left. CT of the abdomen and pelvis: Chronic changes without acute abnormality. Electronically Signed   By: Inez Catalina M.D.   On: 03/10/2020 22:44   DG Chest Portable 1 View  Result Date: 03/10/2020 CLINICAL DATA:  Short of breath, hypoxia, history of COVID-19 pneumonia EXAM: PORTABLE CHEST 1 VIEW COMPARISON:  03/07/2020 FINDINGS: Single frontal view of the chest demonstrates progressive bilateral multifocal pneumonia most pronounced at the left lung base. Lung volumes are diminished. There are trace bilateral pleural effusions. No pneumothorax. Cardiac silhouette is unremarkable.  IMPRESSION: 1. Progression of bilateral multifocal pneumonia, most pronounced at the left lung base. 2. Trace bilateral pleural effusions. Electronically Signed   By: Randa Ngo M.D.   On: 03/10/2020 20:40    Procedures .Critical Care Performed by: Volanda Napoleon, PA-C Authorized by: Volanda Napoleon, PA-C   Critical care provider statement:    Critical care time (minutes):  35   Critical care was time spent personally by me on the following activities:  Discussions with consultants, evaluation of patient's response to treatment, examination of patient, ordering and performing treatments and interventions, ordering and review of laboratory studies, ordering and review of radiographic studies, pulse oximetry, re-evaluation of patient's condition, obtaining history from patient or surrogate and review of old charts   (including critical care time)  Medications Ordered in ED Medications  sodium chloride flush (NS) 0.9 % injection 3 mL (has no administration in time range)  enoxaparin (LOVENOX) injection 40 mg (has no administration in time range)  acetaminophen (TYLENOL) tablet 650 mg (has no administration in time range)  senna-docusate (Senokot-S) tablet 1 tablet (has no administration in time range)  promethazine (PHENERGAN) tablet 12.5 mg (has no administration in time range)  gabapentin (NEURONTIN) capsule 600 mg (has no administration in time range)  pantoprazole (PROTONIX) EC tablet 40 mg (has no administration in time range)  guaiFENesin-dextromethorphan (ROBITUSSIN DM) 100-10 MG/5ML syrup 15 mL (has no administration in time range)  benzonatate (TESSALON) capsule 100 mg (has no administration in time range)  iohexol (OMNIPAQUE) 350 MG/ML injection 100 mL (100 mLs Intravenous Contrast Given 03/10/20 2231)  cefTRIAXone (ROCEPHIN) 1 g in sodium chloride 0.9 % 100 mL IVPB (0 g Intravenous Stopped 03/10/20 2357)  azithromycin (ZITHROMAX) 500 mg in sodium chloride 0.9 % 250 mL IVPB  (0 mg Intravenous Stopped 03/11/20 0036)    ED Course  I have reviewed the triage vital signs and the nursing notes.  Pertinent labs & imaging results that were available during my care of the patient were reviewed by me and considered  in my medical decision making (see chart for details).    MDM Rules/Calculators/A&P                      59 year old male with recent COVID-19 diagnosis who presents for evaluation of worsening shortness of breath, chest pain.  Also reports some epigastric pain.  On initial ED arrival, he was satting 60% on his baseline O2.  He was switched to 10 L and was only maintaining about 85%.  He was bumped up to 15 L nonrebreather.  He is tachypneic with increased accessory muscle usage.  He has crackles noted to both bases.  Concern for infectious process versus CHF.  Plan to check labs, chest x-ray.  BMP is unremarkable.  LFTs show slight elevation in AST, ALT, alk phos.  CBC shows leukocytosis of 30.2.  His previous 1 showed that it was only 16.  His initial troponin is 23.  BMP shows BUN of 23, creatinine of 1.42.  Chest x-ray shows progression of bilateral multifocal pneumonia, most pronounced in the left lung base.  CTA chest negative for any acute PE.  He has progressive bilateral infiltrates.  He has small bilateral pleural effusions noted.  CT on pelvis shows no acute abnormalities.  Patient still requiring 10 L of oxygen to maintain obtain O2 sats.  Given progressive worsening pneumonia, leukocytosis, progressively worsening hypoxia, shortness of breath, will require admission.  Patient started on antibiotics.  Question if there is a bacterial superimposed infection over the Covid.  Discussed patient with internal medicine team.  They will accept patient for admission.  Portions of this note were generated with [employer]. Dictation errors may occur despite best attempts at proofreading.   Final Clinical Impression(s) / ED Diagnoses Final  diagnoses:  Acute hypoxemic respiratory failure due to COVID-19 [facility])  Pneumonia due to COVID-19 virus  Epigastric pain    Rx / DC Orders ED Discharge Orders    None       Desma Mcgregor 03/11/20 0039    Virgel Manifold, MD 03/11/20 1606

## 2020-03-10 NOTE — ED Notes (Signed)
[facility] Cranker labs, with Blood Cultures.

## 2020-03-10 NOTE — ED Triage Notes (Signed)
The pt was in the hospital for covid pneumonia on the 15  He keeps c/o sob  sats on arrival tonight at triage was  In the triage area  And hsi sats were in the 60s  Non-rebreather mask was placed  On arrival

## 2020-03-10 NOTE — ED Notes (Signed)
2nd set of blood cultures drawn 

## 2020-03-10 NOTE — H&P (Addendum)
Date: 03/11/2020               Patient Name:  Eric Ingram MRN: [id number]  DOB: 11/17/1961 Age / Sex: 59 y.o., male   PCP: Earlene Plater, MD         Medical Service: Internal Medicine Teaching Service         Attending Physician: Dr. Evette Doffing, Mallie Mussel, *    First Contact: Dr. Eileen Stanford Pager: [phone]  Second Contact: Dr. Eileen Stanford Pager: [phone]       After Hours (After 5p/  First Contact Pager: [phone]  weekends / holidays): Second Contact Pager: [phone]   Chief Complaint: Shortness of breath  History of Present Illness:  Patient speaks [place], with very limited English. Multiple attempts to speak with a Education officer, environmental, but were not successful.  Spoke with Daughter [name], who is able to speak limited [place]. History is limited and provided by patient and daughter.  Mr. Skylier Kestler is a 73 y/o with a PMH of CHF, HTN, alpha thal, who presents with chest pain. Patient states that pain began 2-3 days ago. He states that he has sharp pain with deep breaths. He has had a cough productive for mucus, but the cough has been improving. Additionaly, he reports being hot,  He has not used tylenol or NSAIDS. He completed his medications from his previous hospitalization. He denies nausea, vomiting, diarrhea.   ED Course: CT Angio negative for PE, CXR progression of bilateral multifocal pneumonia, AKI 1.42, negative BNP, troponins 23->31.   Meds:  Albuterol Amlodipine 2.5 mg daily Tussionex Famotidine 20 mg 2 times daily Gabapentin 3 mg 3 times daily Lisinopril 40 mg daily Metoprolol succinate 25 mg daily Pantoprazole 40 mg twice daily Carafate 1 g 4 times daily   Allergies: Allergies as of 03/10/2020   (No Known Allergies)   Past Medical History:  Diagnosis Date   Anemia    CHF (congestive heart failure) (HCC)    Dental infection 08/02/2018   GERD (gastroesophageal reflux disease)    Hypertension    Shingles rash 07/07/2019   Thalassemia, alpha (HCC)    Viral gastritis  02/08/2019    Family History:  No significant family history  Social History:  Housing: Lives at home with wife and two daughters.  Tobacco: Previous smoker 20 years ago ETOH: Denies Illicit Substance: Denies  Review of Systems: A complete ROS was negative except as per HPI.   Physical Exam: Blood pressure (!) 90/54, pulse 97, temperature 97.6 F (36.4 C), temperature source Oral, resp. rate (!) 34, height 5\' 2"  (1.575 m), weight 74.8 kg, SpO2 100 %. Physical Exam Vitals reviewed.  Constitutional:      General: He is in acute distress.     Appearance: He is diaphoretic. He is not ill-appearing or toxic-appearing.  HENT:     Head: Normocephalic and atraumatic.  Cardiovascular:     Rate and Rhythm: Regular rhythm. Tachycardia present.     Heart sounds: Normal heart sounds. Heart sounds not distant. No murmur. No friction rub. No gallop.   Pulmonary:     Effort: Pulmonary effort is normal. Tachypnea present.     Breath sounds: Normal breath sounds. No decreased breath sounds, wheezing, rhonchi or rales.     Comments: RR 32-38 during interview Chest:     Chest wall: Tenderness present.     Comments: Tenderness to palpation.  Abdominal:     General: Bowel sounds are normal.  Musculoskeletal:     Right lower  leg: No edema.     Left lower leg: No edema.  Neurological:     Mental Status: He is alert.  Psychiatric:        Mood and Affect: Mood is anxious.     EKG: personally reviewed my interpretation is sinus tach.   CXR: personally reviewed my interpretation is progression of bilateral multifocal pneumonia. Trace bilateral pleural effusions.   Assessment & Plan by Problem: Active Problems:   Pneumonia due to COVID-19 virus   Acute respiratory distress syndrome (ARDS) due to COVID-19 virus (HCC)   Acute kidney injury ([place])  Chelsey Mesick is a 59 y/o male with a PMH of CHF, HTN, alpha thal, is being admitted for possible superimposed bacterial pneumonia on previous COVID  19 pneumonia.   Possible Bacterial Pneumonia Superimposed on COVID 19:  Patient with recent covid 56 (02/22/20) presents to the ED with 2-3 days worsening chest pain, worsening chest imaging, elevated white counts, and in need of high flow O2.  - Attempted to give tocilizumab, due to patient requiring high air flow and some transaminitis, but due to the experimental nature of the drug, were unable to give to the patient at this time due to not being able to properly explain benefits and risks associated with this medication, due to language barrier. Attempts again in the AM when translator available.  - Azithromycin 500 mg IV - Ceftriaxone 1g IV - Procalcitonin - Dexamethasone - albuterol q6h - LR infusion 100 mL x5Hours   - Airborne and Contact precautions - CRP - ABG - CMP - CBC - Ferritin - Incentive spirometry  - Continuous pulse ox. - Monitor Vitals - Tessalon 100mg  - Nyquil Severe Cold  - Blood Culture  Acute Kidney Injury:  Elevated of Cr: 1.42 from 1.12, ordering urine studies to r/o pre-renal v intrinsic.  - Urine Creatinine - Urine Sodium   Microcytic Anemia:  Hgb 10.1, MCV: 69.1 likely 2/2 alpha thalassemia.    Dispo: Admit patient to Inpatient with expected length of stay greater than 2 midnights.  Signed: Maudie Mercury, MD 03/11/2020, 3:54 AM

## 2020-03-11 ENCOUNTER — Ambulatory Visit: Payer: Self-pay

## 2020-03-11 DIAGNOSIS — N179 Acute kidney failure, unspecified: Secondary | ICD-10-CM | POA: Diagnosis present

## 2020-03-11 LAB — COMPREHENSIVE METABOLIC PANEL
ALT: 46 U/L — ABNORMAL HIGH (ref 0–44)
AST: 31 U/L (ref 15–41)
Albumin: 2 g/dL — ABNORMAL LOW (ref 3.5–5.0)
Alkaline Phosphatase: 275 U/L — ABNORMAL HIGH (ref 38–126)
Anion gap: 17 — ABNORMAL HIGH (ref 5–15)
BUN: 22 mg/dL — ABNORMAL HIGH (ref 6–20)
CO2: 17 mmol/L — ABNORMAL LOW (ref 22–32)
Calcium: 8.4 mg/dL — ABNORMAL LOW (ref 8.9–10.3)
Chloride: 102 mmol/L (ref 98–111)
Creatinine, Ser: 1.25 mg/dL — ABNORMAL HIGH (ref 0.61–1.24)
GFR calc Af Amer: >60 mL/min (ref >60)
GFR calc non Af Amer: >60 mL/min (ref >60)
Glucose, Bld: 95 mg/dL (ref 70–99)
Potassium: 4.6 mmol/L (ref 3.5–5.1)
Sodium: 136 mmol/L (ref 135–145)
Total Bilirubin: 1.6 mg/dL — ABNORMAL HIGH (ref 0.3–1.2)
Total Protein: 7.2 g/dL (ref 6.5–8.1)

## 2020-03-11 LAB — PROCALCITONIN: Procalcitonin: 0.41 ng/mL

## 2020-03-11 LAB — CBC WITH DIFFERENTIAL/PLATELET
Abs Immature Granulocytes: 0.31 10*3/uL — ABNORMAL HIGH (ref 0.00–0.07)
Basophils Absolute: 0.1 10*3/uL (ref 0.0–0.1)
Basophils Relative: 0 %
Eosinophils Absolute: 0.1 10*3/uL (ref 0.0–0.5)
Eosinophils Relative: 1 %
HCT: 29.4 % — ABNORMAL LOW (ref 39.0–52.0)
Hemoglobin: 9.4 g/dL — ABNORMAL LOW (ref 13.0–17.0)
Immature Granulocytes: 1 %
Lymphocytes Relative: 3 %
Lymphs Abs: 0.9 10*3/uL (ref 0.7–4.0)
MCH: 21.7 pg — ABNORMAL LOW (ref 26.0–34.0)
MCHC: 32 g/dL (ref 30.0–36.0)
MCV: 67.7 fL — ABNORMAL LOW (ref 80.0–100.0)
Monocytes Absolute: 1.3 10*3/uL — ABNORMAL HIGH (ref 0.1–1.0)
Monocytes Relative: 5 %
Neutro Abs: 25 10*3/uL — ABNORMAL HIGH (ref 1.7–7.7)
Neutrophils Relative %: 90 %
Platelets: 521 10*3/uL — ABNORMAL HIGH (ref 150–400)
RBC: 4.34 MIL/uL (ref 4.22–5.81)
RDW: 16.3 % — ABNORMAL HIGH (ref 11.5–15.5)
WBC: 27.6 10*3/uL — ABNORMAL HIGH (ref 4.0–10.5)
nRBC: 0 % (ref 0.0–0.2)

## 2020-03-11 LAB — C-REACTIVE PROTEIN: CRP: 24 mg/dL — ABNORMAL HIGH (ref <1.0)

## 2020-03-11 LAB — MRSA PCR SCREENING: MRSA by PCR: NEGATIVE

## 2020-03-11 LAB — FERRITIN: Ferritin: 882 ng/mL — ABNORMAL HIGH (ref 24–336)

## 2020-03-11 LAB — BLOOD GAS, ARTERIAL
Acid-base deficit: 4.1 mmol/L — ABNORMAL HIGH (ref 0.0–2.0)
Bicarbonate: 20.2 mmol/L (ref 20.0–28.0)
Drawn by: 347621
FIO2: 100
O2 Saturation: 95.8 %
Patient temperature: 36.5
pCO2 arterial: 34.9 mmHg (ref 32.0–48.0)
pH, Arterial: 7.378 (ref 7.350–7.450)
pO2, Arterial: 82 mmHg — ABNORMAL LOW (ref 83.0–108.0)

## 2020-03-11 LAB — SODIUM, URINE, RANDOM: Sodium, Ur: 18 mmol/L

## 2020-03-11 LAB — CREATININE, URINE, RANDOM: Creatinine, Urine: 168.68 mg/dL

## 2020-03-11 LAB — D-DIMER, QUANTITATIVE: D-Dimer, Quant: 7.18 ug/mL-FEU — ABNORMAL HIGH (ref 0.00–0.50)

## 2020-03-11 MED ORDER — LACTATED RINGERS IV SOLN: INTRAVENOUS | Status: DC

## 2020-03-11 MED ORDER — SODIUM CHLORIDE 0.9 % IV SOLN
2.0000 g | Freq: Two times a day (BID) | INTRAVENOUS | Status: DC
  Administered 2020-03-11 – 2020-03-12 (×2): 2 g via INTRAVENOUS
  Filled 2020-03-11 (×2): qty 2

## 2020-03-11 MED ORDER — VANCOMYCIN HCL 1500 MG/300ML IV SOLN
1500.0000 mg | Freq: Once | INTRAVENOUS | Status: AC
  Administered 2020-03-11: 1500 mg via INTRAVENOUS
  Filled 2020-03-11: qty 300

## 2020-03-11 MED ORDER — ALBUTEROL SULFATE HFA 108 (90 BASE) MCG/ACT IN AERS
1.0000 | INHALATION_SPRAY | Freq: Four times a day (QID) | RESPIRATORY_TRACT | Status: DC | PRN
  Administered 2020-03-12 – 2020-03-18 (×9): 2 via RESPIRATORY_TRACT
  Filled 2020-03-11: qty 6.7

## 2020-03-11 MED ORDER — LACTATED RINGERS IV SOLN: INTRAVENOUS | Status: AC

## 2020-03-11 MED ORDER — VANCOMYCIN VARIABLE DOSE PER UNSTABLE RENAL FUNCTION (PHARMACIST DOSING): Status: DC

## 2020-03-11 MED ORDER — DEXAMETHASONE SODIUM PHOSPHATE 10 MG/ML IJ SOLN
6.0000 mg | INTRAMUSCULAR | Status: DC
  Administered 2020-03-11 – 2020-03-12 (×2): 6 mg via INTRAVENOUS
  Filled 2020-03-11 (×2): qty 1

## 2020-03-11 MED ORDER — TOCILIZUMAB 400 MG/20ML IV SOLN
8.0000 mg/kg | Freq: Once | INTRAVENOUS | Status: DC
  Filled 2020-03-11: qty 29.9

## 2020-03-11 MED ORDER — ENOXAPARIN SODIUM 40 MG/0.4ML ~~LOC~~ SOLN
40.0000 mg | Freq: Two times a day (BID) | SUBCUTANEOUS | Status: DC
  Administered 2020-03-11 – 2020-03-18 (×15): 40 mg via SUBCUTANEOUS
  Filled 2020-03-11 (×15): qty 0.4

## 2020-03-11 NOTE — Progress Notes (Signed)
Ventolin inhaler is  not available in the pyxis. Notified  to the pharmacy.

## 2020-03-11 NOTE — Progress Notes (Signed)
Patient received from the  Ed. Patient is alert and oriented x4. Vital signs are stable. Iv in place and running fluid. skin assessment done with another nurse. No skin break down noted on examination. Patient has given instructions about call bell and phone. Bed in low position and call bell in reach.

## 2020-03-11 NOTE — Progress Notes (Addendum)
   Subjective: HD#1   Overnight: Admitted  Today, Eric Ingram continues to remain uncomfortable due to difficulty breathing.  He continues to endorse epigastric pain, neck pain as well as pleuritic chest pain.  On further questioning, he states that after his discharge from the previous hospitalization, had had some improvement however was not completely back to his baseline.  He then began gradually experiencing shortness of breath, chest pain until yesterday when his symptoms worsened and prompted him to present to the hospital.  Objective:  Vital signs in last 24 hours: Vitals:   03/11/20 0500 03/11/20 0556 03/11/20 0800 03/11/20 1100  BP:  91/65 96/76   Pulse: 92 95 98   Resp: (!) 22 (!) 26 (!) 22   Temp:   97.6 F (36.4 C)   TempSrc:   Axillary   SpO2: 99% 97% 97% 97%  Weight:      Height:       Const: In mild distress due to shortness of breath HEENT: Nonrebreather in place Resp: Crackles auscultated at the posterior lung fields, oxygen saturation in the mid to upper 90s on 15 L nonrebreather, tachypneic CV: RRR, no murmurs, gallop, rub   Assessment/Plan:  Principal Problem:   Acute respiratory distress syndrome (ARDS) due to COVID-19 virus [facility]) Active Problems:   Pneumonia due to COVID-19 virus   Acute kidney injury [facility])  Mr. Ahlstrand is a 59 year old community dwelling gentleman with medical history significant for hypertension, alpha thalassemia, chronic diastolic heart failure here for management of an acute hypoxic respiratory failure thought to be secondary to progression of prior COVID-19 pneumonia.  Acute hypoxic respiratory failure/ARDS 2/2 SARS-CoV-2: The etiology of his ongoing acute respiratory failure is likely secondary to progression of prior COVID-19 pneumonia.  During his recent hospitalization, he completed 5 days of remdesivir and 10 days of Decadron.  He was discharged on 2-3 L supplemental oxygen.  Bacterial pneumonia is less likely as his procalcitonin  is unremarkable.  He status post azithromycin and ceftriaxone.  Given that we do not have a clear explanation of his respiratory failure, we will treat for possible HCAP empirically with vancomycin and cefepime currently, he remains on 15 L nonrebreather 15 L and oxygen saturation is mid 90s to upper 90s.  His inflammatory markers are elevated.  I spoke to patient's daughter as well as patient in the room and they denied any prior TB infection or exposure.  We will also start him on tocilizumab as well and monitor daily for progression -Continue vancomycin, cefepime -Continue Decadron 6 mg -Tocilizumab x 1 dose -Supplemental oxygen as needed -Incentive spirometer -Robitussin-DM, Tessalon  -Flutter valve -Out of bed to chair -Self proning as needed   AKI: Resolved sCr 1.2<<1.4 (Baseline 1.1).    Hypertension: BP soft.  Hold amlodipine, Toprol-XL, lisinopril   FEN: Heart healthy when of nonrebreather VTE ppx: Subcutaneous Lovenox twice daily CODE STATUS: Full code  Prior to Admission Living Arrangement: Home Anticipated Discharge Location: Home Barriers to Discharge: Home Dispo: Anticipated discharge in approximately 3-4 day(s).    Jean Rosenthal, MD 03/11/2020, 12:06 PM Pager: [phone] Internal Medicine Teaching Service

## 2020-03-11 NOTE — Progress Notes (Signed)
Pharmacy Antibiotic Note  Eric Ingram is a 59 y.o. male admitted on 03/10/2020 with PNA + COVID. Pharmacy has been consulted for Vanco/Cefepime dosing.  CC/HPI: clinical worsening over the last few days with much worsened shortness of breath. . RR 35. Requiring 15L/min  PMH: CHF, HTN, alpha thalassemia, anemia, CHF, GERD, shingles,  Significant events: recent hospitalization for pneumonia due to SARS-CoV-2 that was treated with 5 days of remdesivir and 10 days of dexamethasone  ID: COVID + PNA to start abx and reassess in 48 hrs. - Afebrile, CRP 24, PC 0.41, WBC 30.2>27.6  Vanco 3/29 Cefepime 3.29>>   Plan: Cefepime 2g IV q12hrs Vanocmycin 1500mg  IV x 1, reassess Scr in AM before calculating a maintenance dose  Height: 5\' 2"  (157.5 cm) Weight: 165 lb (74.8 kg) IBW/kg (Calculated) : 54.6  Temp (24hrs), Avg:97.7 F (36.5 C), Min:97.5 F (36.4 C), Max:98.1 F (36.7 C)  Recent Labs  Lab 03/07/20 1103 03/10/20 2003 03/11/20 0320  WBC 16.8* 30.2* 27.6*  CREATININE 1.12 1.42* 1.25*    Estimated Creatinine Clearance: 57.1 mL/min (A) (by C-G formula based on SCr of 1.25 mg/dL (H)).    No Known Allergies   Math Brazie S. Alford Highland, PharmD, BCPS Clinical Staff Pharmacist [email or web address]  Wayland Salinas 03/11/2020 12:39 PM

## 2020-03-12 LAB — CBC WITH DIFFERENTIAL/PLATELET
Abs Immature Granulocytes: 0.22 10*3/uL — ABNORMAL HIGH (ref 0.00–0.07)
Basophils Absolute: 0 10*3/uL (ref 0.0–0.1)
Basophils Relative: 0 %
Eosinophils Absolute: 0 10*3/uL (ref 0.0–0.5)
Eosinophils Relative: 0 %
HCT: 24.8 % — ABNORMAL LOW (ref 39.0–52.0)
Hemoglobin: 8.1 g/dL — ABNORMAL LOW (ref 13.0–17.0)
Immature Granulocytes: 1 %
Lymphocytes Relative: 4 %
Lymphs Abs: 0.8 10*3/uL (ref 0.7–4.0)
MCH: 22 pg — ABNORMAL LOW (ref 26.0–34.0)
MCHC: 32.7 g/dL (ref 30.0–36.0)
MCV: 67.2 fL — ABNORMAL LOW (ref 80.0–100.0)
Monocytes Absolute: 0.8 10*3/uL (ref 0.1–1.0)
Monocytes Relative: 4 %
Neutro Abs: 20.7 10*3/uL — ABNORMAL HIGH (ref 1.7–7.7)
Neutrophils Relative %: 91 %
Platelets: 494 10*3/uL — ABNORMAL HIGH (ref 150–400)
RBC: 3.69 MIL/uL — ABNORMAL LOW (ref 4.22–5.81)
RDW: 16.1 % — ABNORMAL HIGH (ref 11.5–15.5)
WBC: 22.6 10*3/uL — ABNORMAL HIGH (ref 4.0–10.5)
nRBC: 0 % (ref 0.0–0.2)

## 2020-03-12 LAB — BASIC METABOLIC PANEL
Anion gap: 11 (ref 5–15)
BUN: 23 mg/dL — ABNORMAL HIGH (ref 6–20)
CO2: 20 mmol/L — ABNORMAL LOW (ref 22–32)
Calcium: 8.3 mg/dL — ABNORMAL LOW (ref 8.9–10.3)
Chloride: 106 mmol/L (ref 98–111)
Creatinine, Ser: 0.98 mg/dL (ref 0.61–1.24)
GFR calc Af Amer: >60 mL/min (ref >60)
GFR calc non Af Amer: >60 mL/min (ref >60)
Glucose, Bld: 118 mg/dL — ABNORMAL HIGH (ref 70–99)
Potassium: 4.9 mmol/L (ref 3.5–5.1)
Sodium: 137 mmol/L (ref 135–145)

## 2020-03-12 LAB — COMPREHENSIVE METABOLIC PANEL
ALT: 49 U/L — ABNORMAL HIGH (ref 0–44)
AST: 52 U/L — ABNORMAL HIGH (ref 15–41)
Albumin: 1.7 g/dL — ABNORMAL LOW (ref 3.5–5.0)
Alkaline Phosphatase: 261 U/L — ABNORMAL HIGH (ref 38–126)
Anion gap: 7 (ref 5–15)
BUN: 24 mg/dL — ABNORMAL HIGH (ref 6–20)
CO2: 20 mmol/L — ABNORMAL LOW (ref 22–32)
Calcium: 8.2 mg/dL — ABNORMAL LOW (ref 8.9–10.3)
Chloride: 109 mmol/L (ref 98–111)
Creatinine, Ser: 1 mg/dL (ref 0.61–1.24)
Glucose, Bld: 127 mg/dL — ABNORMAL HIGH (ref 70–99)
Potassium: 5.3 mmol/L — ABNORMAL HIGH (ref 3.5–5.1)
Sodium: 136 mmol/L (ref 135–145)
Total Bilirubin: 1.3 mg/dL — ABNORMAL HIGH (ref 0.3–1.2)
Total Protein: 6.4 g/dL — ABNORMAL LOW (ref 6.5–8.1)

## 2020-03-12 LAB — FERRITIN: Ferritin: 1144 ng/mL — ABNORMAL HIGH (ref 24–336)

## 2020-03-12 LAB — C-REACTIVE PROTEIN: CRP: 20.3 mg/dL — ABNORMAL HIGH (ref <1.0)

## 2020-03-12 LAB — D-DIMER, QUANTITATIVE: D-Dimer, Quant: 5.11 ug/mL-FEU — ABNORMAL HIGH (ref 0.00–0.50)

## 2020-03-12 MED ORDER — POLYETHYLENE GLYCOL 3350 17 G PO PACK
17.0000 g | PACK | Freq: Every day | ORAL | Status: DC
  Administered 2020-03-12 – 2020-03-17 (×6): 17 g via ORAL
  Filled 2020-03-12 (×7): qty 1

## 2020-03-12 MED ORDER — KETOROLAC TROMETHAMINE 30 MG/ML IJ SOLN
30.0000 mg | Freq: Once | INTRAMUSCULAR | Status: AC
  Administered 2020-03-12: 30 mg via INTRAVENOUS
  Filled 2020-03-12: qty 1

## 2020-03-12 MED ORDER — VANCOMYCIN HCL 750 MG/150ML IV SOLN
750.0000 mg | Freq: Two times a day (BID) | INTRAVENOUS | Status: DC
  Administered 2020-03-12 – 2020-03-15 (×7): 750 mg via INTRAVENOUS
  Filled 2020-03-12 (×9): qty 150

## 2020-03-12 MED ORDER — AMLODIPINE BESYLATE 2.5 MG PO TABS
2.5000 mg | ORAL_TABLET | Freq: Every day | ORAL | Status: DC
  Administered 2020-03-12 – 2020-03-18 (×7): 2.5 mg via ORAL
  Filled 2020-03-12 (×7): qty 1

## 2020-03-12 MED ORDER — SIMETHICONE 80 MG PO CHEW
80.0000 mg | CHEWABLE_TABLET | Freq: Four times a day (QID) | ORAL | Status: DC | PRN
  Administered 2020-03-13 – 2020-03-17 (×6): 80 mg via ORAL
  Filled 2020-03-12 (×7): qty 1

## 2020-03-12 MED ORDER — SODIUM CHLORIDE 0.9 % IV SOLN
2.0000 g | Freq: Three times a day (TID) | INTRAVENOUS | Status: AC
  Administered 2020-03-12 – 2020-03-17 (×17): 2 g via INTRAVENOUS
  Filled 2020-03-12 (×17): qty 2

## 2020-03-12 MED ORDER — KETOROLAC TROMETHAMINE 30 MG/ML IJ SOLN
30.0000 mg | Freq: Four times a day (QID) | INTRAMUSCULAR | Status: DC
  Filled 2020-03-12 (×2): qty 1

## 2020-03-12 MED ORDER — KETOROLAC TROMETHAMINE 30 MG/ML IJ SOLN
15.0000 mg | Freq: Two times a day (BID) | INTRAMUSCULAR | Status: AC
  Administered 2020-03-13 – 2020-03-14 (×4): 15 mg via INTRAVENOUS
  Filled 2020-03-12 (×4): qty 1

## 2020-03-12 NOTE — Progress Notes (Signed)
Pharmacy Antibiotic Note  Eric Ingram is a 59 y.o. male admitted on 03/10/2020 with PNA + COVID. Pharmacy has been consulted for Vanco/Cefepime dosing.  CC/HPI: clinical worsening over the last few days with much worsened shortness of breath. . RR 35. Requiring 15L/min  PMH: CHF, HTN, alpha thalassemia, anemia, CHF, GERD, shingles,  Significant events: recent hospitalization for pneumonia due to SARS-CoV-2 that was treated with 5 days of remdesivir and 10 days of dexamethasone  ID: COVID + PNA to start abx and reassess in 48 hrs. - Afebrile, CRP 20.3, PC 0.41, WBC 30.2>22.6,   Vanco 3/29>> Cefepime 3.29>> 3/28: Actemra  3/28: BC x 2 3/29: MRSA PCR: negative  Vancomycin 750 mg IV Q 12 hrs. Goal AUC 400-550. Expected AUC: 433.5 SCr used: 1, VD 0.5   Plan: Dexameth 6mg  IV q24h + Toradol scheduled? Messaged MD Incr Cefepime 2g IV q8hrs Start Vancomycin 750mg  IV q 12hrs this AM    Height: 5\' 2"  (157.5 cm) Weight: 165 lb (74.8 kg) IBW/kg (Calculated) : 54.6  Temp (24hrs), Avg:97.8 F (36.6 C), Min:97.4 F (36.3 C), Max:98.2 F (36.8 C)  Recent Labs  Lab 03/07/20 1103 03/10/20 2003 03/11/20 0320 03/12/20 0340  WBC 16.8* 30.2* 27.6* 22.6*  CREATININE 1.12 1.42* 1.25* 1.00    Estimated Creatinine Clearance: 71.4 mL/min (by C-G formula based on SCr of 1 mg/dL).    No Known Allergies   Virgil Slinger S. Alford Highland, PharmD, BCPS Clinical Staff Pharmacist [email or web address]  Wayland Salinas 03/12/2020 8:12 AM

## 2020-03-12 NOTE — Plan of Care (Addendum)
Patient with second episode of tachycardia with HR in 120's, 02 saturation down in upper 70's, RR 34, skin warm and dry. C/o left lower rib pain, "gas pain" per patient. 02 increased to 15L HFNC and patient coached to slow breathing, demonstrates understanding. 02 saturation slowly back up to mid 90's and HFNC decreased to 13L at present. Pain medication provided. Patient with RR now at 22 unlabored. Will continue to  Monitor. Patient resting quietly at present. RR even and unlabored. 02 saturation 93%on 10 L HFNC.

## 2020-03-12 NOTE — Progress Notes (Signed)
   Subjective: HD#2   Overnight: Weaned to HFNC  Today, Eric Ingram continues to endorse abdominal bloating, constipation and pleuritic pain. He does report of come improvement to his shortness of breath.  He also has a productive cough that exacerbates his pleuritic chest pain.  During our encounter today, he had 1 bout of cough and complained of left-sided chest pain.  His oxygen saturation decreased to about 80% and he was placed on 12 L high flow nasal cannula which improved his oxygenation.  After several minutes, his left-sided chest pain improved  Objective:  Vital signs in last 24 hours: Vitals:   03/11/20 2100 03/11/20 2200 03/12/20 0000 03/12/20 0525  BP:   96/72   Pulse:   87   Resp: (!) 24 (!) 24 20   Temp:    98 F (36.7 C)  TempSrc:    Oral  SpO2:   97%   Weight:      Height:       Const: In distress due to pleuritic chest pain HEENT: Nonrebreather mask in place Resp: SPO2 improved to 99-100% after nonrebreather mask was placed, still persistent rales auscultated at the posterior lung lobes  Assessment/Plan:  Principal Problem:   Acute respiratory distress syndrome (ARDS) due to COVID-19 virus [facility]) Active Problems:   Pneumonia due to COVID-19 virus   Acute kidney injury [facility])   Eric Ingram is a 59 year old community dwelling gentleman with medical history significant for hypertension, alpha thalassemia, chronic diastolic heart failure here for management of an acute hypoxic respiratory failure thought to be secondary to progression of prior COVID-19 pneumonia.   Acute hypoxic respiratory failure/ARDS, Multifactorial but likely 2/2 SARS-CoV-2: He continues to require either 15 L of high flow nasal cannula or 12-15 L supplemental oxygen via nonrebreather mask.  This is usually in cases where he has severe cough.  He continues to endorse left-sided pleuritic chest pain.  His D-dimer and CRP have minimally improved however ferritin increased.  He has remained afebrile  with intermittent tachycardia.  We will continue supportive care with supplemental oxygen and monitor his progress daily. -Continue vancomycin, cefepime x48 hours (stop date 03/13/2020) -Discontinue Decadron 6 mg -s/p Tocilizumab x 1 dose -Supplemental oxygen as needed -Incentive spirometer -Robitussin-DM, Tessalon  -Flutter valve -Out of bed to chair -Self proning as needed   Hyperkalemia: K+ of 5.3, does not seem hemolyzed. EKG pending and will repeat BMP -Continue to monitor   Abnormal LFTs: AST 52<<31, ALT 49<<46.  Likely in the setting of systemic inflammatory phase -Continue to monitor   AKI: Resolved sCr 1.2<<1.4 (Baseline 1.1).    Hypertension: BP in the room elevated to 160s -Resume amlodipine 2.5 mg daily   FEN: Heart healthy when of nonrebreather VTE ppx: Subcutaneous Lovenox twice daily CODE STATUS: Full code  Prior to Admission Living Arrangement: Home Anticipated Discharge Location: Home Barriers to Discharge: Home Dispo: Anticipated discharge in approximately 3-4 day(s).    Jean Rosenthal, MD 03/12/2020, 6:44 AM Pager: [phone] Internal Medicine Teaching Service

## 2020-03-13 LAB — CBC WITH DIFFERENTIAL/PLATELET
Abs Immature Granulocytes: 0.13 10*3/uL — ABNORMAL HIGH (ref 0.00–0.07)
Basophils Absolute: 0 10*3/uL (ref 0.0–0.1)
Basophils Relative: 0 %
Eosinophils Absolute: 0.1 10*3/uL (ref 0.0–0.5)
Eosinophils Relative: 0 %
HCT: 25.8 % — ABNORMAL LOW (ref 39.0–52.0)
Hemoglobin: 8.3 g/dL — ABNORMAL LOW (ref 13.0–17.0)
Immature Granulocytes: 1 %
Lymphocytes Relative: 6 %
Lymphs Abs: 1.2 10*3/uL (ref 0.7–4.0)
MCH: 21.9 pg — ABNORMAL LOW (ref 26.0–34.0)
MCHC: 32.2 g/dL (ref 30.0–36.0)
MCV: 68.1 fL — ABNORMAL LOW (ref 80.0–100.0)
Monocytes Absolute: 0.8 10*3/uL (ref 0.1–1.0)
Monocytes Relative: 4 %
Neutro Abs: 18 10*3/uL — ABNORMAL HIGH (ref 1.7–7.7)
Neutrophils Relative %: 89 %
Platelets: 500 10*3/uL — ABNORMAL HIGH (ref 150–400)
RBC: 3.79 MIL/uL — ABNORMAL LOW (ref 4.22–5.81)
RDW: 16.3 % — ABNORMAL HIGH (ref 11.5–15.5)
WBC: 20.2 10*3/uL — ABNORMAL HIGH (ref 4.0–10.5)
nRBC: 0.1 % (ref 0.0–0.2)

## 2020-03-13 LAB — COMPREHENSIVE METABOLIC PANEL
ALT: 101 U/L — ABNORMAL HIGH (ref 0–44)
AST: 83 U/L — ABNORMAL HIGH (ref 15–41)
Albumin: 1.8 g/dL — ABNORMAL LOW (ref 3.5–5.0)
Alkaline Phosphatase: 283 U/L — ABNORMAL HIGH (ref 38–126)
Anion gap: 11 (ref 5–15)
BUN: 22 mg/dL — ABNORMAL HIGH (ref 6–20)
CO2: 22 mmol/L (ref 22–32)
Calcium: 8.4 mg/dL — ABNORMAL LOW (ref 8.9–10.3)
Chloride: 103 mmol/L (ref 98–111)
Creatinine, Ser: 1.01 mg/dL (ref 0.61–1.24)
GFR calc Af Amer: >60 mL/min (ref >60)
GFR calc non Af Amer: >60 mL/min (ref >60)
Glucose, Bld: 93 mg/dL (ref 70–99)
Potassium: 5 mmol/L (ref 3.5–5.1)
Sodium: 136 mmol/L (ref 135–145)
Total Bilirubin: 0.7 mg/dL (ref 0.3–1.2)
Total Protein: 6.8 g/dL (ref 6.5–8.1)

## 2020-03-13 LAB — D-DIMER, QUANTITATIVE: D-Dimer, Quant: 6.65 ug/mL-FEU — ABNORMAL HIGH (ref 0.00–0.50)

## 2020-03-13 LAB — C-REACTIVE PROTEIN: CRP: 10.6 mg/dL — ABNORMAL HIGH (ref <1.0)

## 2020-03-13 LAB — PROCALCITONIN: Procalcitonin: 0.27 ng/mL

## 2020-03-13 LAB — FERRITIN: Ferritin: 1628 ng/mL — ABNORMAL HIGH (ref 24–336)

## 2020-03-13 NOTE — Progress Notes (Signed)
   Subjective: Patient was seen and evaluated at bedside on morning rounds. This is HD# 3. Tele Interpreter assisted with communication with patient. Mr. Joswick temperately required higher (15 li) HF [place] overnight. He is now on 10 li. He still reports productive cough but mentions that it is better now and in overall feels much better. Pleuretic chest and back pain is better but still occurs when he coughs. We discussed plan of care of advised him to ambulate and sit on the chair and to continue using incentive spirometry.   Objective:  Vital signs in last 24 hours: Vitals:   03/13/20 0000 03/13/20 0400 03/13/20 0800 03/13/20 0838  BP:  130/82 120/90   Pulse:  97 96   Resp: (!) 22 (!) 21 (!) 33   Temp:  98.2 F (36.8 C) 98 F (36.7 C)   TempSrc:  Oral Oral   SpO2:  95% 100% 90%  Weight:      Height:       Physical Exam  Constitutional: Sitting on the bed with no acute distress.  Cardiovascular: RRR, nl S1S2, no murmur,  no LEE, pulses are palpable and normal bilaterally Respiratory: On 10 li HF [place], effort normal, no respiratory distress. Has diffuse fine crackles and rhonchi  GI: Soft.No distension. There is no tenderness.  Neurological: Is alert and oriented x 3  Skin: Not diaphoretic. No erythema.  MSK: No LE swelling, erythema or tenderness Psychiatric: Normal mood and affect. Behavior is normal. Judgment and thought content normal.   Assessment/Plan:  Principal Problem:   Acute respiratory distress syndrome (ARDS) due to COVID-19 virus [facility]) Active Problems:   Pneumonia due to COVID-19 virus   Acute kidney injury ([place]) 59 year old community dwelling gentleman with past medical history of HTN, alpha thalassemia, HFpEF, recent hospitalization for COVID-19 infection, presented with acute hypoxic respiratory failure/ARDS.   Acute hypoxic respiratory failure and ARDS: likely a late presentation of recent COVID-19 infection: He reports improvement in his productive cough and  pleuritic chest pain. CRP trending down but rest of inflammatory markers are still elevated. Patient required up to 15 L oxygen supplement through high flow nasal cannula so far.  He is currently on 10 L HF [place]. He has been on empiric antibiotic for probable superimposed bacterial pneumonia. (Procalcitonin mildly elevated at 0.4 that came down to 0.27 in 48 hours) Patient was hospitalized recently for Covid 19 pneumonia and received dexamethasone and remdesivir.  He received 1 dose of Tocilizumab 3/29.  Will continue supportive care and AB for probable superimposed bacterial PNA.  -Continue supplemental oxygen to keep O2 saturation more than 92% -Out of bed to to chair -Continue incentive spirometry -We will continue empiric antibiotic Vancomycin and cefepime for now and during hospitalization -Continue intermediate dose of Lovenox for DVT prophylaxis -Trend inflammatory markers -We will try to keep patient at dry side and will be cautious with IV fluid -Continue airborne precautions -Continue Tessalon and Robitussin  as needed  Hyperkalemia: Improved. K today 5   Abnormal LFT:  Can be in setting of inflammation due to COVID-19 infection.  Will monitor  Diet: HH IV fluid: None VTE ppx: intermediate dose of Lovenox Code status: Full  Prior to Admission Living Arrangement: Home Anticipated Discharge Location: Home Barriers to Discharge:  Dispo: Anticipated discharge in approximately 2-3 day(s).   Dewayne Hatch, MD 03/13/2020, 11:33 AM Pager: [phone]

## 2020-03-13 NOTE — Plan of Care (Signed)

## 2020-03-13 NOTE — Plan of Care (Addendum)
Patient up to [facility] x 1 assist. 02 saturation into 70's with RR 38, labored. Increased cough with white sputum production. 02 increased to 15 L HFNC and patient coached to slow deep breathing. Demonstrates intermittently understanding. HR into 120's, ST. Assisted back to bed. 02 saturation slowly back up 87-89% on 15 L HFNC. Skin warm and dry.  Patient resting quietly at present. 02 saturation 97-98% on 15 L HFNC. 02 decreased to 13 L HFNC.

## 2020-03-14 ENCOUNTER — Ambulatory Visit: Payer: Self-pay

## 2020-03-14 DIAGNOSIS — J1282 Pneumonia due to coronavirus disease 2019: Secondary | ICD-10-CM

## 2020-03-14 LAB — COMPREHENSIVE METABOLIC PANEL
ALT: 134 U/L — ABNORMAL HIGH (ref 0–44)
AST: 100 U/L — ABNORMAL HIGH (ref 15–41)
Albumin: 2 g/dL — ABNORMAL LOW (ref 3.5–5.0)
Alkaline Phosphatase: 263 U/L — ABNORMAL HIGH (ref 38–126)
Anion gap: 11 (ref 5–15)
BUN: 22 mg/dL — ABNORMAL HIGH (ref 6–20)
CO2: 21 mmol/L — ABNORMAL LOW (ref 22–32)
Calcium: 8.4 mg/dL — ABNORMAL LOW (ref 8.9–10.3)
Chloride: 105 mmol/L (ref 98–111)
Creatinine, Ser: 1.05 mg/dL (ref 0.61–1.24)
GFR calc Af Amer: >60 mL/min (ref >60)
GFR calc non Af Amer: >60 mL/min (ref >60)
Glucose, Bld: 97 mg/dL (ref 70–99)
Potassium: 4.7 mmol/L (ref 3.5–5.1)
Sodium: 137 mmol/L (ref 135–145)
Total Bilirubin: 1 mg/dL (ref 0.3–1.2)
Total Protein: 6.6 g/dL (ref 6.5–8.1)

## 2020-03-14 LAB — CBC WITH DIFFERENTIAL/PLATELET
Abs Immature Granulocytes: 0 10*3/uL (ref 0.00–0.07)
Basophils Absolute: 0.1 10*3/uL (ref 0.0–0.1)
Basophils Relative: 1 %
Eosinophils Absolute: 1 10*3/uL — ABNORMAL HIGH (ref 0.0–0.5)
Eosinophils Relative: 12 %
HCT: 27.7 % — ABNORMAL LOW (ref 39.0–52.0)
Hemoglobin: 8.8 g/dL — ABNORMAL LOW (ref 13.0–17.0)
Lymphocytes Relative: 15 %
Lymphs Abs: 1.2 10*3/uL (ref 0.7–4.0)
MCH: 22.1 pg — ABNORMAL LOW (ref 26.0–34.0)
MCHC: 31.8 g/dL (ref 30.0–36.0)
MCV: 69.4 fL — ABNORMAL LOW (ref 80.0–100.0)
Monocytes Absolute: 0.2 10*3/uL (ref 0.1–1.0)
Monocytes Relative: 2 %
Neutro Abs: 5.6 10*3/uL (ref 1.7–7.7)
Neutrophils Relative %: 70 %
Platelets: 461 10*3/uL — ABNORMAL HIGH (ref 150–400)
RBC: 3.99 MIL/uL — ABNORMAL LOW (ref 4.22–5.81)
RDW: 16.5 % — ABNORMAL HIGH (ref 11.5–15.5)
WBC: 8 10*3/uL (ref 4.0–10.5)
nRBC: 0.4 % — ABNORMAL HIGH (ref 0.0–0.2)
nRBC: 1 /100 WBC — ABNORMAL HIGH

## 2020-03-14 LAB — C-REACTIVE PROTEIN: CRP: 4.6 mg/dL — ABNORMAL HIGH (ref <1.0)

## 2020-03-14 LAB — FERRITIN: Ferritin: 1265 ng/mL — ABNORMAL HIGH (ref 24–336)

## 2020-03-14 LAB — PROTIME-INR
INR: 1.1 (ref 0.8–1.2)
Prothrombin Time: 14.3 seconds (ref 11.4–15.2)

## 2020-03-14 LAB — D-DIMER, QUANTITATIVE: D-Dimer, Quant: 11.01 ug/mL-FEU — ABNORMAL HIGH (ref 0.00–0.50)

## 2020-03-14 NOTE — Evaluation (Signed)
Physical Therapy Evaluation Patient Details Name: Markeese Jochem MRN: [id number] DOB: 1961-05-29 Today's Date: 03/14/2020   History of Present Illness  pt is a 59 y/o male with h/o HTN, CHF and recent admission for Covid 19, admitted with worsening SOB.  ABG's consistent with ARDS, CT shows worsening bilateral diffuse infiltrates.  Work up includes PNA, ARDS and AKI.  Clinical Impression  Pt admitted with/for ARDS/PNA superimposed over recent Covid.  Pt mobilizes well at min guard or better for bed mobility and transfers, but desaturates quickly and will probably have a harder time with ambulation..  Pt currently limited functionally due to the problems listed below.  (see problems list.)  Pt will benefit from PT to maximize function and safety to be able to get home safely withlimited available assist.     Follow Up Recommendations Home health PT;Supervision/Assistance - 24 hour    Equipment Recommendations  Other (comment)(TBA)    Recommendations for Other Services       Precautions / Restrictions Precautions Precautions: Other (comment) Precaution Comments: watch O2 sats Restrictions Weight Bearing Restrictions: No      Mobility  Bed Mobility Overal bed mobility: Modified Independent             General bed mobility comments: pt boosted with ease to EOB and up to sitting  Transfers Overall transfer level: Needs assistance   Transfers: Sit to/from Stand;Stand Pivot Transfers Sit to Stand: Supervision Stand pivot transfers: Supervision       General transfer comment: Actual transfer was accomplished without assist with relative ease, but pt unable to maintain SpO2 and sats dropped into the upper 80's on 6L , with corresponding increase of HR into the upper 110's  Ambulation/Gait             General Gait Details: not attempted today due to unable to keep sats up with lesser mobility.  Stairs            Wheelchair Mobility    Modified Rankin (Stroke  Patients Only)       Balance Overall balance assessment: Needs assistance   Sitting balance-Leahy Scale: Good     Standing balance support: No upper extremity supported Standing balance-Leahy Scale: Good                               Pertinent Vitals/Pain Pain Assessment: Faces Faces Pain Scale: Hurts a little bit Pain Location: upper back and neck pain Pain Descriptors / Indicators: Discomfort;Guarding Pain Intervention(s): Monitored during session    Home Living Family/patient expects to be discharged to:: Private residence Living Arrangements: Children;Spouse/significant other Available Help at Discharge: Family;Available PRN/intermittently Type of Home: Apartment Home Access: Level entry     Home Layout: One level Home Equipment: Grab bars - tub/shower      Prior Function Level of Independence: Independent         Comments: Independent with ADLs, IADLs, mobility without AD. Has not been working for 4-5 years due to medical complexities.      Hand Dominance   Dominant Hand: Left    Extremity/Trunk Assessment   Upper Extremity Assessment Upper Extremity Assessment: Overall WFL for tasks assessed    Lower Extremity Assessment Lower Extremity Assessment: Overall WFL for tasks assessed       Communication   Communication: No difficulties  Cognition Arousal/Alertness: Awake/alert Behavior During Therapy: WFL for tasks assessed/performed Overall Cognitive Status: Within Functional Limits for tasks assessed  General Comments General comments (skin integrity, edema, etc.): At rest, SpO2 at rest on 6L [place] at 96% and RR of 40 and HR at 104 bpm.  Sitting EOB pt desat'd to 77% and when donning socks HR jumped up to 128 BPM.  Pt sat EOB for ~69min while he was coached for better breathing technique and sats rose to 88% at 118 bpm.  Sats 85-88 after transfer and 92% finally in the chair.  Max noted  HR in the mid 140's    Exercises Other Exercises Other Exercises: general active leg ROM exercise at EOB caused increase RR and HR   Assessment/Plan    PT Assessment Patient needs continued PT services  PT Problem List Decreased activity tolerance;Decreased mobility;Decreased safety awareness;Cardiopulmonary status limiting activity       PT Treatment Interventions DME instruction;Gait training;Functional mobility training;Therapeutic activities;Balance training;Patient/family education;Therapeutic exercise    PT Goals (Current goals can be found in the Care Plan section)  Acute Rehab PT Goals Patient Stated Goal: Breathe better, safe at home by myself PT Goal Formulation: With patient Time For Goal Achievement: 03/28/20 Potential to Achieve Goals: Fair    Frequency Min 3X/week   Barriers to discharge        Co-evaluation   Reason for Co-Treatment: (P) Complexity of the patient's impairments (multi-system involvement);Other (comment)(decreased tolerance, need for interpreter use)   OT goals addressed during session: (P) ADL's and self-care       AM-PAC PT "6 Clicks" Mobility  Outcome Measure Help needed turning from your back to your side while in a flat bed without using bedrails?: None Help needed moving from lying on your back to sitting on the side of a flat bed without using bedrails?: None Help needed moving to and from a bed to a chair (including a wheelchair)?: None Help needed standing up from a chair using your arms (e.g., wheelchair or bedside chair)?: None Help needed to walk in hospital room?: A Little Help needed climbing 3-5 steps with a railing? : A Little 6 Click Score: 22    End of Session   Activity Tolerance: Patient tolerated treatment well;Patient limited by fatigue Patient left: in chair;with call bell/phone within reach Nurse Communication: Mobility status PT Visit Diagnosis: Difficulty in walking, not elsewhere classified (R26.2)    Time:  [id number] PT Time Calculation (min) (ACUTE ONLY): 30 min   Charges:   PT Evaluation $PT Eval Moderate Complexity: 1 Mod          03/14/2020  Ginger Carne., PT Acute Rehabilitation Services [phone]  (pager) [phone]  (office)03/14/2020   Tessie Fass Cheronda Erck 03/14/2020, 3:29 PM

## 2020-03-14 NOTE — Plan of Care (Signed)

## 2020-03-14 NOTE — Progress Notes (Signed)
   Subjective: Eric Ingram was seen and evaluated at bedside this morning.  This is hospital day #4.  Interpreter assisting with translation over the phone.  Patient reports improvement.  Still has cough and associated pleuritic chest pain with that.  He was able to ambulate to chair and denies any issues with that however per nurse, his O2 saturation dropped to 70s with movement.  He does not have other complaints.    Objective:  Vital signs in last 24 hours: Vitals:   03/13/20 2000 03/14/20 0000 03/14/20 0400 03/14/20 0805  BP: 112/83 98/71 114/81 121/76  Pulse: 86 78 85 94  Resp: (!) 22 18 (!) 24 (!) 37  Temp: 97.7 F (36.5 C) 98.2 F (36.8 C) 98.1 F (36.7 C) 98.1 F (36.7 C)  TempSrc: Oral Oral Oral Oral  SpO2: 99% 100% 96% 90%  Weight:      Height:       Physical Exam  Constitutional: Pleasant gentleman, sitting on the bed in acute distress.  HENT:  Head: Normocephalic and atraumatic.  Cardiovascular: RRR, nl S1S2, no murmur,  no LEE Respiratory: Receiving supplemental oxygen: 6 L high flow nasal cannula, no respiratory distress, diffuse rhonchi and faint crackle GI: No distension.  Neurological: Is alert and oriented x 3 Skin: Not diaphoretic. No erythema.  Psychiatric: Normal mood and affect. Behavior is normal. Judgment and thought content normal.    Assessment/Plan:  Principal Problem:   Acute respiratory distress syndrome (ARDS) due to COVID-19 virus [facility]) Active Problems:   Pneumonia due to COVID-19 virus   Acute kidney injury ([place])  59 year old gentleman with past medical history of HTN, alpha thalassemia, HFpEF, recent hospitalization for COVID-19 infection, presented with acute hypoxic respiratory failure and ARDS.   Acute hypoxic respiratory failure/ARDS: Likely late presentation of recent COVID-19 infection: Status post Tocilizumab 3/29. (Status post remdesivir and dexamethasone on recent admission). On empiric antibiotic Vanco and cefepime (started  3/29) for probable superimposed. bacterial PNA. Symptoms improving. Oxygen requirement improving.  Now on 6 L high flow nasal cannula. Still has diffuse rhonchi and crackles on exam and becomes hypoxic with ambulation from bed to chair.  -Continue supportive care -Continue supplemental oxygen as needed to keep O2 saturation more than 92% -PT OT eval and treat -Continue empiric Vanco and cefepime (started 3/29) -Continue intermediate dose of Lovenox for DVT prophylaxis -Continue Tessalon and Robitussin for cough -Monitor inflammatory markers and CMP  Abnormal LFT: LFT remains elevated LFT remains elevated.  Likely in setting of inflammation due to COVID-19 infection.  Will monitor.  Hyperkalemia: Resolved  Prior to Admission Living Arrangement: Home Anticipated Discharge Location: Pending PT/OT eval Barriers to Discharge: Dispo: Anticipated discharge depends on clinical improvement. Likely 3-4 days  Eric Hatch, MD 03/14/2020, 12:57 PM Pager: [phone]

## 2020-03-14 NOTE — Progress Notes (Signed)
Occupational Therapy Evaluation Patient Details Name: Eric Ingram MRN: [id number] DOB: 19-Oct-1961 Today's Date: 03/14/2020    History of Present Illness pt is a 59 y/o male with h/o HTN, CHF and recent admission for Covid 19, admitted with worsening SOB.  ABG's consistent with ARDS, CT shows worsening bilateral diffuse infiltrates.  Work up includes PNA, ARDS and AKI.   Clinical Impression   PTA, pt lives at home with family. Pt was Independent with ADLs, IADLs and mobility in the home without AD. Pt's family typically works and is not home during the day. Pt was assisting in caregiving for grandchild at home during day. Presently, pt received in bed on 6 L O2 (96% at rest, 40 RR, 104 HR) and motivated to participate with therapy. Pt Modified Independent for bed mobility to sit EOB with desats to 77% with HR increasing to 128bpm while donning socks. Pt sat EOB and educated in pursed lip breathing recovering to 88% after > 3 minutes. Pt overall supervision/setup to don socks and shorts sitting/standing EOB, but supervision needed to monitor vitals during tasks as pt quickly desats. Pt Supervision for bed > chair transfer without AD desats to 85-88% on 6 L O2 after this. With around 3-5 minutes seated rest break, pt returned to 92% SpO2 and HR at 117bpm. Recommend HHOT at DC to ensure adequate safety and cardiopulmonary tolerance during ADLs. Recommend 24/7 supervision/assistance initially as pt with severely decreased activity tolerance. Will continue to follow acutely and update recommendations as needed.     Follow Up Recommendations  Home health OT;Supervision/Assistance - 24 hour(Initially 24/7 Supervision while recovering)    Equipment Recommendations  3 in 1 bedside commode    Recommendations for Other Services       Precautions / Restrictions Precautions Precautions: Other (comment) Precaution Comments: watch O2 sats Restrictions Weight Bearing Restrictions: No      Mobility Bed  Mobility Overal bed mobility: Modified Independent             General bed mobility comments: pt boosted with ease to EOB and up to sitting  Transfers Overall transfer level: Needs assistance Equipment used: None Transfers: Sit to/from [employer] Transfers Sit to Stand: Supervision Stand pivot transfers: Supervision       General transfer comment: Actual transfer was accomplished without assist with relative ease, but pt unable to maintain SpO2 and sats dropped into the upper 80's on 6L [place], with corresponding increase of HR into the upper 110's    Balance Overall balance assessment: Needs assistance   Sitting balance-Leahy Scale: Good     Standing balance support: No upper extremity supported Standing balance-Leahy Scale: Good                             ADL either performed or assessed with clinical judgement   ADL Overall ADL's : Needs assistance/impaired Eating/Feeding: Independent;Sitting   Grooming: Set up;Sitting   Upper Body Bathing: Supervision/ safety;Sitting   Lower Body Bathing: Set up;Supervison/ safety;Sit to/from stand;Sitting/lateral leans   Upper Body Dressing : Set up;Sitting   Lower Body Dressing: Set up;Supervision/safety;Sit to/from stand;Sitting/lateral leans Lower Body Dressing Details (indicate cue type and reason): Setup/supervision to don socks and shorts sitting and standing EOB Toilet Transfer: Supervision/safety;Stand-pivot   Toileting- Clothing Manipulation and Hygiene: Supervision/safety;Sit to/from stand;Sitting/lateral lean         General ADL Comments: Pt overall supervision for ADLs, able to complete without physical assistance but very limited due  to decreased cardiopulmonary tolerance      Vision Baseline Vision/History: No visual deficits       Perception     Praxis      Pertinent Vitals/Pain Pain Assessment: Faces Faces Pain Scale: Hurts a little bit Pain Location: upper back and neck  pain Pain Descriptors / Indicators: Discomfort;Guarding Pain Intervention(s): Monitored during session     Hand Dominance Left   Extremity/Trunk Assessment Upper Extremity Assessment Upper Extremity Assessment: Overall WFL for tasks assessed   Lower Extremity Assessment Lower Extremity Assessment: Overall WFL for tasks assessed       Communication Communication Communication: No difficulties   Cognition Arousal/Alertness: Awake/alert Behavior During Therapy: WFL for tasks assessed/performed Overall Cognitive Status: Within Functional Limits for tasks assessed                                     General Comments  At rest, SpO2 at rest on 6L [place] at 96% and RR of 40 and HR at 104 bpm.  Sitting EOB pt desat'd to 77% and when donning socks HR jumped up to 128 BPM.  Pt sat EOB for ~18min while he was coached for better breathing technique and sats rose to 88% at 118 bpm.  Sats 85-88 after transfer and 92% finally in the chair.  Max noted HR in the mid 140's    Exercises Exercises: Other exercises Other Exercises Other Exercises: general active leg ROM exercise at EOB caused increase RR and HR   Shoulder Instructions      Home Living Family/patient expects to be discharged to:: Private residence Living Arrangements: Children;Spouse/significant other Available Help at Discharge: Family;Available PRN/intermittently Type of Home: Apartment Home Access: Level entry     Home Layout: One level     Bathroom Shower/Tub: Occupational psychologist: Standard     Home Equipment: Grab bars - tub/shower          Prior Functioning/Environment Level of Independence: Independent        Comments: Independent with ADLs, IADLs, mobility without AD. Has not been working for 4-5 years due to medical complexities.         OT Problem List: Decreased activity tolerance;Cardiopulmonary status limiting activity      OT Treatment/Interventions: Self-care/ADL  training;Therapeutic exercise;Energy conservation;DME and/or AE instruction;Therapeutic activities;Patient/family education    OT Goals(Current goals can be found in the care plan section) Acute Rehab OT Goals Patient Stated Goal: Breathe better, safe at home by myself OT Goal Formulation: With patient Time For Goal Achievement: 03/28/20 Potential to Achieve Goals: Good ADL Goals Pt Will Perform Grooming: Independently;standing Pt Will Perform Lower Body Bathing: with modified independence;sit to/from stand;sitting/lateral leans Pt Will Perform Lower Body Dressing: Independently;sit to/from stand;sitting/lateral leans Pt Will Transfer to Toilet: Independently;ambulating;regular height toilet Pt Will Perform Toileting - Clothing Manipulation and hygiene: Independently;sit to/from stand;sitting/lateral leans Additional ADL Goal #1: Pt will demonstrate implementation of energy conservation strategies at least 3 times during daily tasks to maximize independence.  OT Frequency: Min 3X/week   Barriers to D/C:            Co-evaluation PT/OT/SLP Co-Evaluation/Treatment: Yes Reason for Co-Treatment: Complexity of the patient's impairments (multi-system involvement);Other (comment)(decreased tolerance, need for interpreter use)   OT goals addressed during session: ADL's and self-care      AM-PAC OT "6 Clicks" Daily Activity     Outcome Measure Help from another person eating meals?:  None Help from another person taking care of personal grooming?: A Little Help from another person toileting, which includes using toliet, bedpan, or urinal?: A Little Help from another person bathing (including washing, rinsing, drying)?: A Little Help from another person to put on and taking off regular upper body clothing?: A Little Help from another person to put on and taking off regular lower body clothing?: A Little 6 Click Score: 19   End of Session Equipment Utilized During Treatment: Oxygen Nurse  Communication: Mobility status;Other (comment)(O2 levels)  Activity Tolerance: Patient tolerated treatment well;Other (comment)(limited by desats) Patient left: in chair;with call bell/phone within reach  OT Visit Diagnosis: Unsteadiness on feet (R26.81);Other (comment)(decreased cardiopulmonary tolerance )                Time: [id number] OT Time Calculation (min): 30 min Charges:  OT General Charges $OT Visit: 1 Visit OT Evaluation $OT Eval Moderate Complexity: 1 Mod  Layla Maw, OTR/L  Layla Maw 03/14/2020, 3:34 PM

## 2020-03-14 NOTE — TOC Initial Note (Signed)
Transition of Care Eric Ingram) - Initial/Assessment Note    Patient Details  Name: Eric Ingram MRN: [id number] Date of Birth: [birth year]  Transition of Care Eric Ingram) CM/SW Contact:    Eric Labrador, RN Phone Number: 03/14/2020, 5:07 PM  Clinical Narrative:  Pt is a readmit.  Pt recently discharge home dx with covid on home oxygen.  CM unable to reach pt via phone. CM was able to reach pts daughter Eric Ingram.  Eric Ingram to discuss Eric Ingram recommendation with father this evening, Eric Ingram requested CM arrange charity HH. CM gave chairty referral to Eric Ingram for [facility] - agency accepts pending orders.                  Expected Discharge Plan: [place]     Patient Goals and CMS Choice     Choice offered to / list presented to : Adult Children  Expected Discharge Plan and Services Expected Discharge Plan: [place] arrangements for the past 2 months: Single Family Home                           HH Arranged: PT, OT [place] Agency: [facility] Date [place]: 03/14/20 Time [place]: [place] Representative spoke with at [place]: [place] Arrangements/Services Living arrangements for the past 2 months: [place] with:: Relatives              Current home services: DME(PTA on home oxygen)    Activities of Daily Living      Permission Sought/Granted                  Emotional Assessment              Admission diagnosis:  Acute respiratory distress [R06.03] Epigastric pain [R10.13] Acute hypoxemic respiratory failure due to COVID-19 ([place]) [U07.1, J96.01] Pneumonia due to COVID-19 virus [U07.1, J12.82] Patient Active Problem List   Diagnosis Date Noted  . Acute kidney injury ([place]) 03/11/2020  . Acute respiratory distress syndrome (ARDS) due to COVID-19 virus ([place]) 03/10/2020  . Pneumonia due to COVID-19 virus 02/22/2020  . Cervical radiculopathy 09/12/2019  .  Post herpetic neuralgia 08/29/2019  . EKG abnormalities 08/29/2019  . Adhesive capsulitis of both shoulders 08/29/2019  . Gastro-esophageal reflux 07/18/2019  . Wheezing on both sides of chest 02/16/2019  . Cough 02/08/2019  . Constipation 02/08/2019  . Shoulder pain, left 02/08/2019  . Abdominal bloating 11/07/2018  . Acute on chronic diastolic heart failure ([place])   . Iron deficiency anemia due to chronic blood loss   . Chronic sinusitis 01/05/2018  . Health care maintenance 01/05/2018  . Hypertension 10/16/2017   PCP:  Earlene Plater, MD Pharmacy:   [facility] [place] [id number] Phone: [phone] Fax: [place], [place] - [address] [place] [address] Phone: [phone] Fax: [phone]     Social Determinants of Health (SDOH) Interventions    Readmission Risk Interventions No flowsheet data found.

## 2020-03-15 DIAGNOSIS — U071 COVID-19: Principal | ICD-10-CM

## 2020-03-15 DIAGNOSIS — J80 Acute respiratory distress syndrome: Secondary | ICD-10-CM

## 2020-03-15 LAB — CBC WITH DIFFERENTIAL/PLATELET
Abs Immature Granulocytes: 0.04 10*3/uL (ref 0.00–0.07)
Abs Immature Granulocytes: 0.04 10*3/uL (ref 0.00–0.07)
Basophils Absolute: 0.1 10*3/uL (ref 0.0–0.1)
Basophils Absolute: 0.1 10*3/uL (ref 0.0–0.1)
Basophils Relative: 1 %
Basophils Relative: 1 %
Eosinophils Absolute: 1.1 10*3/uL — ABNORMAL HIGH (ref 0.0–0.5)
Eosinophils Absolute: 1.2 10*3/uL — ABNORMAL HIGH (ref 0.0–0.5)
Eosinophils Relative: 15 %
Eosinophils Relative: 16 %
HCT: 28.1 % — ABNORMAL LOW (ref 39.0–52.0)
HCT: 29.1 % — ABNORMAL LOW (ref 39.0–52.0)
Hemoglobin: 8.8 g/dL — ABNORMAL LOW (ref 13.0–17.0)
Hemoglobin: 9.1 g/dL — ABNORMAL LOW (ref 13.0–17.0)
Immature Granulocytes: 1 %
Immature Granulocytes: 1 %
Lymphocytes Relative: 19 %
Lymphocytes Relative: 15 %
Lymphs Abs: 1.5 10*3/uL (ref 0.7–4.0)
Lymphs Abs: 1.1 10*3/uL (ref 0.7–4.0)
MCH: 21.5 pg — ABNORMAL LOW (ref 26.0–34.0)
MCH: 21.6 pg — ABNORMAL LOW (ref 26.0–34.0)
MCHC: 31.3 g/dL (ref 30.0–36.0)
MCHC: 31.3 g/dL (ref 30.0–36.0)
MCV: 68.7 fL — ABNORMAL LOW (ref 80.0–100.0)
MCV: 69 fL — ABNORMAL LOW (ref 80.0–100.0)
Monocytes Absolute: 0.5 10*3/uL (ref 0.1–1.0)
Monocytes Absolute: 0.4 10*3/uL (ref 0.1–1.0)
Monocytes Relative: 7 %
Monocytes Relative: 6 %
Neutro Abs: 4.5 10*3/uL (ref 1.7–7.7)
Neutro Abs: 4.6 10*3/uL (ref 1.7–7.7)
Neutrophils Relative %: 57 %
Neutrophils Relative %: 61 %
Platelets: 432 10*3/uL — ABNORMAL HIGH (ref 150–400)
Platelets: 465 10*3/uL — ABNORMAL HIGH (ref 150–400)
RBC: 4.09 MIL/uL — ABNORMAL LOW (ref 4.22–5.81)
RBC: 4.22 MIL/uL (ref 4.22–5.81)
RDW: 16.4 % — ABNORMAL HIGH (ref 11.5–15.5)
RDW: 16.9 % — ABNORMAL HIGH (ref 11.5–15.5)
WBC: 7.8 10*3/uL (ref 4.0–10.5)
WBC: 7.3 10*3/uL (ref 4.0–10.5)
nRBC: 0.5 % — ABNORMAL HIGH (ref 0.0–0.2)
nRBC: 0.7 % — ABNORMAL HIGH (ref 0.0–0.2)

## 2020-03-15 LAB — COMPREHENSIVE METABOLIC PANEL
ALT: 108 U/L — ABNORMAL HIGH (ref 0–44)
ALT: 117 U/L — ABNORMAL HIGH (ref 0–44)
AST: 58 U/L — ABNORMAL HIGH (ref 15–41)
AST: 82 U/L — ABNORMAL HIGH (ref 15–41)
Albumin: 2.1 g/dL — ABNORMAL LOW (ref 3.5–5.0)
Albumin: 2.2 g/dL — ABNORMAL LOW (ref 3.5–5.0)
Alkaline Phosphatase: 232 U/L — ABNORMAL HIGH (ref 38–126)
Alkaline Phosphatase: 228 U/L — ABNORMAL HIGH (ref 38–126)
Anion gap: 8 (ref 5–15)
Anion gap: 11 (ref 5–15)
BUN: 20 mg/dL (ref 6–20)
BUN: 17 mg/dL (ref 6–20)
CO2: 24 mmol/L (ref 22–32)
CO2: 23 mmol/L (ref 22–32)
Calcium: 8.4 mg/dL — ABNORMAL LOW (ref 8.9–10.3)
Calcium: 8.5 mg/dL — ABNORMAL LOW (ref 8.9–10.3)
Chloride: 103 mmol/L (ref 98–111)
Chloride: 99 mmol/L (ref 98–111)
Creatinine, Ser: 1.02 mg/dL (ref 0.61–1.24)
Creatinine, Ser: 0.86 mg/dL (ref 0.61–1.24)
GFR calc Af Amer: >60 mL/min (ref >60)
GFR calc Af Amer: >60 mL/min (ref >60)
GFR calc non Af Amer: >60 mL/min (ref >60)
GFR calc non Af Amer: >60 mL/min (ref >60)
Glucose, Bld: 98 mg/dL (ref 70–99)
Glucose, Bld: 121 mg/dL — ABNORMAL HIGH (ref 70–99)
Potassium: 4.8 mmol/L (ref 3.5–5.1)
Potassium: 4.2 mmol/L (ref 3.5–5.1)
Sodium: 135 mmol/L (ref 135–145)
Sodium: 133 mmol/L — ABNORMAL LOW (ref 135–145)
Total Bilirubin: 0.9 mg/dL (ref 0.3–1.2)
Total Bilirubin: 0.6 mg/dL (ref 0.3–1.2)
Total Protein: 6.7 g/dL (ref 6.5–8.1)
Total Protein: 7.1 g/dL (ref 6.5–8.1)

## 2020-03-15 LAB — CULTURE, BLOOD (ROUTINE X 2)
Culture: NO GROWTH
Culture: NO GROWTH
Special Requests: ADEQUATE

## 2020-03-15 LAB — D-DIMER, QUANTITATIVE
D-Dimer, Quant: 9.92 ug/mL-FEU — ABNORMAL HIGH (ref 0.00–0.50)
D-Dimer, Quant: 10.67 ug/mL-FEU — ABNORMAL HIGH (ref 0.00–0.50)

## 2020-03-15 LAB — PROTIME-INR
INR: 1.2 (ref 0.8–1.2)
Prothrombin Time: 14.7 seconds (ref 11.4–15.2)

## 2020-03-15 LAB — VANCOMYCIN, PEAK: Vancomycin Pk: 36 ug/mL (ref 30–40)

## 2020-03-15 LAB — C-REACTIVE PROTEIN
CRP: 2.7 mg/dL — ABNORMAL HIGH (ref <1.0)
CRP: 2.3 mg/dL — ABNORMAL HIGH (ref <1.0)

## 2020-03-15 LAB — FERRITIN
Ferritin: 1139 ng/mL — ABNORMAL HIGH (ref 24–336)
Ferritin: 1291 ng/mL — ABNORMAL HIGH (ref 24–336)

## 2020-03-15 LAB — MAGNESIUM: Magnesium: 1.9 mg/dL (ref 1.7–2.4)

## 2020-03-15 LAB — VANCOMYCIN, TROUGH: Vancomycin Tr: 15 ug/mL (ref 15–20)

## 2020-03-15 LAB — PHOSPHORUS: Phosphorus: 2.8 mg/dL (ref 2.5–4.6)

## 2020-03-15 MED ORDER — VANCOMYCIN HCL 500 MG/100ML IV SOLN
500.0000 mg | Freq: Two times a day (BID) | INTRAVENOUS | Status: DC
  Administered 2020-03-15 – 2020-03-16 (×2): 500 mg via INTRAVENOUS
  Filled 2020-03-15 (×3): qty 100

## 2020-03-15 NOTE — Progress Notes (Signed)
Patient was able to get up from bed and sit on the chair for most of the day. Patient is now on 3L N/[place] oxygen saturation 93 %. Patient educated on incentive spirometry and flutter valve.

## 2020-03-15 NOTE — Plan of Care (Signed)

## 2020-03-15 NOTE — Progress Notes (Signed)
   Subjective: Patient was seen and evaluated at bedside on morning rounds. Language interpreter used. No acute events overnight. Patient endorses continued productive cough associated with some pleuretic chest and back pain. His symptoms has been the same in past 2 days but better than arrival. He ambulated to chair yesterday with PT assistance.   Objective:  Vital signs in last 24 hours: Vitals:   03/15/20 0350 03/15/20 0828 03/15/20 0835 03/15/20 1012  BP: 114/79 131/80  121/81  Pulse:  (!) 104 99   Resp:  (!) 30 (!) 28   Temp: 98.2 F (36.8 C) 97.8 F (36.6 C)    TempSrc: Oral Oral    SpO2:  92% 94%   Weight:      Height:       Physical Exam  Constitutional: Well-developed and well-nourished. No acute distress.  HENT:  Head: Normocephalic and atraumatic.  Eyes: Conjunctivae are normal, EOM nl Cardiovascular: RRR, nl S1S2, no murmur,  no LEE Respiratory: On 6 li HFNC. No respiratory distress. No wheezes.  GI: Soft. Bowel sounds are normal. No distension. There is no tenderness.  Neurological: Is alert and oriented x 3  Skin: Not diaphoretic. No erythema.  Psychiatric: Normal mood and affect. Behavior is normal. Judgment and thought content normal.    Assessment/Plan:  Principal Problem:   Acute respiratory distress syndrome (ARDS) due to COVID-19 virus [facility]) Active Problems:   Pneumonia due to COVID-19 virus   Acute kidney injury ([place])  59 year old gentleman with past medical history of HTN, HFpEF, also thalassemia, recent hospitalization for COVID-19 infection, presented with acute hypoxic respiratory failure and ARDS.  Acute hypoxic respiratory failure/ARDS: This is hospital day 6. Still reports productive cough and some pleuritic chest pain but overall feels better compared to arrival time. Patient has been on high flow nasal cannula, O2 requirement improved to 6 L HFNC. Inflammatory markers elevated, LFT elevated but is stable. CRP is trending down. He is a  status post Tocilizomab. He is on empiric antibiotic with Vanco and cefepime (started 3/29) for possible superimposed bacterial PNA. He is on intermediate dose of anticoagulation for VTE prophylaxis.  -Continue supportive care for COVID-19 infection -Continue airborne and contact precautions -Continue as needed oxygen supplement to keep O2 saturation more than 92% -Continue Vanco and cefepime (started 3/29) -If no improvement, will consider Decadron -Continue incentive spirometry -Continue ambulation -Continue Tessalon and Robitussin -Monitor inflammatory markers and LFT    Prior to Admission Living Arrangement: [place] OT and PT. Anticipated Discharge Location: Home with home PT and OT   Dispo:  Anticipated discharge depends on clinical improvement  Dewayne Hatch, MD 03/15/2020, 10:52 AM Pager: [phone]

## 2020-03-15 NOTE — Progress Notes (Addendum)
Pharmacy Antibiotic Note  Eric Ingram is a 59 y.o. male admitted on 03/10/2020 with pneumonia.  Pharmacy has been consulted for Vancomycin and Cefepime dosing. Recent hospitalization 3/11-3/15/2021 with COVID-19 infection.   Vancomycin peak level 36 mcg/ml after 750 mg IV dose last night and trough 15 mcg/ml prior to this am's dose.  Calculated AUC on this regimen in 648, above goal of 400-550.  Plan:  Decrease Vancomycin from 750 mg to 500 mg IV q12h  Estimated AUC: 432  Continue Cefepime 2gm IV q8hrs.  Follow renal function, clinical progress and antibiotic plans   Height: 5\' 2"  (157.5 cm) Weight: 74.8 kg (165 lb) IBW/kg (Calculated) : 54.6  Temp (24hrs), Avg:98.1 F (36.7 C), Min:97.8 F (36.6 C), Max:98.4 F (36.9 C)  Recent Labs  Lab 03/12/20 0340 03/12/20 0340 03/12/20 1313 03/13/20 0317 03/14/20 0507 03/14/20 2356 03/15/20 0018 03/15/20 0827  WBC 22.6*  --   --  20.2* 8.0 7.8  --  7.3  CREATININE 1.00   < > 0.98 1.01 1.05 1.02  --  0.86  VANCOTROUGH  --   --   --   --   --   --   --  15  VANCOPEAK  --   --   --   --   --   --  36  --    < > = values in this interval not displayed.    Estimated Creatinine Clearance: 83 mL/min (by C-G formula based on SCr of 0.86 mg/dL).    No Known Allergies  Antimicrobials this admission:  Vancomycin 3/29>>  Cefepime 3/29 >>  Dose adjustments this admission:  4/2:  VP 36 on 4/1 pm and VT 15 on 4/2 am on 750 mg IV q12h    - AUC calculates to 648 >  decrease to 500 mg IV q12h for AUC 432  Microbiology results:  3/28 blood x 2: negative  3/29 MRSA PCR: negative    - hx 3/11: COVID: positive  Thank you for allowing pharmacy to be a part of this patient's care.  Arty Baumgartner, [place] Phone: [phone] 03/15/2020 3:27 PM

## 2020-03-16 LAB — CBC WITH DIFFERENTIAL/PLATELET
Abs Immature Granulocytes: 0.06 10*3/uL (ref 0.00–0.07)
Basophils Absolute: 0.1 10*3/uL (ref 0.0–0.1)
Basophils Relative: 1 %
Eosinophils Absolute: 1.4 10*3/uL — ABNORMAL HIGH (ref 0.0–0.5)
Eosinophils Relative: 19 %
HCT: 28.1 % — ABNORMAL LOW (ref 39.0–52.0)
Hemoglobin: 8.9 g/dL — ABNORMAL LOW (ref 13.0–17.0)
Immature Granulocytes: 1 %
Lymphocytes Relative: 18 %
Lymphs Abs: 1.3 10*3/uL (ref 0.7–4.0)
MCH: 21.9 pg — ABNORMAL LOW (ref 26.0–34.0)
MCHC: 31.7 g/dL (ref 30.0–36.0)
MCV: 69.2 fL — ABNORMAL LOW (ref 80.0–100.0)
Monocytes Absolute: 0.5 10*3/uL (ref 0.1–1.0)
Monocytes Relative: 7 %
Neutro Abs: 4 10*3/uL (ref 1.7–7.7)
Neutrophils Relative %: 54 %
Platelets: 432 10*3/uL — ABNORMAL HIGH (ref 150–400)
RBC: 4.06 MIL/uL — ABNORMAL LOW (ref 4.22–5.81)
RDW: 16.9 % — ABNORMAL HIGH (ref 11.5–15.5)
WBC: 7.2 10*3/uL (ref 4.0–10.5)
nRBC: 1 % — ABNORMAL HIGH (ref 0.0–0.2)

## 2020-03-16 LAB — COMPREHENSIVE METABOLIC PANEL
ALT: 132 U/L — ABNORMAL HIGH (ref 0–44)
AST: 112 U/L — ABNORMAL HIGH (ref 15–41)
Albumin: 2.2 g/dL — ABNORMAL LOW (ref 3.5–5.0)
Alkaline Phosphatase: 214 U/L — ABNORMAL HIGH (ref 38–126)
Anion gap: 9 (ref 5–15)
BUN: 16 mg/dL (ref 6–20)
CO2: 25 mmol/L (ref 22–32)
Calcium: 8.4 mg/dL — ABNORMAL LOW (ref 8.9–10.3)
Chloride: 99 mmol/L (ref 98–111)
Creatinine, Ser: 0.96 mg/dL (ref 0.61–1.24)
GFR calc Af Amer: >60 mL/min (ref >60)
GFR calc non Af Amer: >60 mL/min (ref >60)
Glucose, Bld: 100 mg/dL — ABNORMAL HIGH (ref 70–99)
Potassium: 4.4 mmol/L (ref 3.5–5.1)
Sodium: 133 mmol/L — ABNORMAL LOW (ref 135–145)
Total Bilirubin: 0.8 mg/dL (ref 0.3–1.2)
Total Protein: 6.7 g/dL (ref 6.5–8.1)

## 2020-03-16 LAB — MAGNESIUM: Magnesium: 2 mg/dL (ref 1.7–2.4)

## 2020-03-16 LAB — FERRITIN: Ferritin: 965 ng/mL — ABNORMAL HIGH (ref 24–336)

## 2020-03-16 LAB — D-DIMER, QUANTITATIVE: D-Dimer, Quant: 10.52 ug/mL-FEU — ABNORMAL HIGH (ref 0.00–0.50)

## 2020-03-16 LAB — C-REACTIVE PROTEIN: CRP: 1.1 mg/dL — ABNORMAL HIGH (ref <1.0)

## 2020-03-16 LAB — PHOSPHORUS: Phosphorus: 3 mg/dL (ref 2.5–4.6)

## 2020-03-16 LAB — PROTIME-INR
INR: 1.2 (ref 0.8–1.2)
Prothrombin Time: 15.2 seconds (ref 11.4–15.2)

## 2020-03-16 LAB — PROCALCITONIN: Procalcitonin: <0.1 ng/mL

## 2020-03-16 MED ORDER — SIMETHICONE 80 MG PO CHEW
80.0000 mg | CHEWABLE_TABLET | Freq: Every day | ORAL | Status: DC | PRN
  Administered 2020-03-16: 80 mg via ORAL

## 2020-03-16 MED ORDER — SIMETHICONE 80 MG PO CHEW
80.0000 mg | CHEWABLE_TABLET | Freq: Once | ORAL | Status: AC
  Administered 2020-03-16: 80 mg via ORAL
  Filled 2020-03-16: qty 1

## 2020-03-16 NOTE — Progress Notes (Signed)
Pt got OOB to chair. Saturations dropped to 78% with respirations in the 40's. Patient educated about deep breathing. No distress noted. Will continue to monitor.  Hiram Comber, RN 03/16/2020 10:33 AM

## 2020-03-16 NOTE — Progress Notes (Signed)
Transitioned pt from HFNC to humidified nasal cannula. Patient currently 90-92% on 3L [place]. Patient stated he wished to get oob to chair at 1000. Will continue to monitor.  Hiram Comber, RN 03/16/2020 8:55 AM

## 2020-03-16 NOTE — Progress Notes (Signed)
   Subjective: Patient was seen and evaluated at bedside.  Language interpreter used. No acute events overnight.  He was able to sit on the chair and O2 requirement decreased.  Now on 6 L nasal cannula.  He still reports productive cough and mentions that sometimes if he cannot cough the phlegm out, he feels pain on his back of chest.  Also complains of having gas and bloated.  Bowel movement normal, denies any abdominal pain nausea vomiting.  Objective:  Vital signs in last 24 hours: Vitals:   03/15/20 1705 03/15/20 2000 03/16/20 0000 03/16/20 0400  BP: 113/86 119/87  (!) 132/91  Pulse: 100 (!) 101  89  Resp: (!) 28 (!) 24  20  Temp: 98.6 F (37 C) 98.6 F (37 C) 98.4 F (36.9 C) 98.8 F (37.1 C)  TempSrc: Oral Oral Oral Oral  SpO2: 93% 92%  98%  Weight:      Height:        Physical Exam  Constitutional: Pleasant gentleman, sitting on hospital chair in no acute distress.  Cardiovascular:  RRR, nl S1S2, no murmur,  no LEE Respiratory: On 6 L nasal cannula, no respiratory distress, effort normal, diffuse rhonchi and crackle improved GI: Soft. Bowel sounds are normal.  Mildly distension. There is no tenderness.  Neurological: Is alert and oriented x 3 Skin: Not diaphoretic. No erythema.  Psychiatric: Normal mood and affect. Behavior is normal. Judgment and thought content normal.   Assessment/Plan:  Principal Problem:   Acute respiratory distress syndrome (ARDS) due to COVID-19 virus [facility]) Active Problems:   Pneumonia due to COVID-19 virus   Acute kidney injury ([place])  59 year old gentleman with past medical history of HTN, HFpEF, also thalassemia, recent hospitalization for COVID-19 infection, presented with acute hypoxic respiratory failure and ARDS.   Acute hypoxic respiratory failure/ARDS: This is hospital day 7. Still reports productive cough and some pleuritic chest pain but overall feels better compared to arrival time. Patient has been on high flow nasal cannula, O2  requirement now improved to 3-6 L [place].  Inflammatory markers improving. CRP is trending down. Ferritin now is trending down. LFT elevated but is stable.  He is a status post Tocilizomab. He is on empiric antibiotic with Vanco and cefepime (started 3/29) for possible superimposed bacterial PNA.  Procalcitonin normalized and O2 requirement improved.  Discontinue Vanco given negative MRSA.  We will continue cefepime at least for total of 7 days and then reevaluate. He is on intermediate dose of anticoagulation for VTE prophylaxis.  -Continue supportive care for COVID-19 infection -Continue airborne and contact precautions -Continue as needed oxygen supplement to keep O2 saturation more than 92% -Disontinue Vancomycin. -Continue cefepime (started 3/29) -If no improvement, will consider Decadron. 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

## 2020-03-17 LAB — D-DIMER, QUANTITATIVE: D-Dimer, Quant: 8.44 ug/mL-FEU — ABNORMAL HIGH (ref 0.00–0.50)

## 2020-03-17 LAB — COMPREHENSIVE METABOLIC PANEL
ALT: 150 U/L — ABNORMAL HIGH (ref 0–44)
AST: 109 U/L — ABNORMAL HIGH (ref 15–41)
Albumin: 2.5 g/dL — ABNORMAL LOW (ref 3.5–5.0)
Alkaline Phosphatase: 196 U/L — ABNORMAL HIGH (ref 38–126)
Anion gap: 13 (ref 5–15)
BUN: 17 mg/dL (ref 6–20)
CO2: 24 mmol/L (ref 22–32)
Calcium: 8.7 mg/dL — ABNORMAL LOW (ref 8.9–10.3)
Chloride: 98 mmol/L (ref 98–111)
Creatinine, Ser: 1.01 mg/dL (ref 0.61–1.24)
GFR calc Af Amer: >60 mL/min (ref >60)
GFR calc non Af Amer: >60 mL/min (ref >60)
Glucose, Bld: 127 mg/dL — ABNORMAL HIGH (ref 70–99)
Potassium: 4 mmol/L (ref 3.5–5.1)
Sodium: 135 mmol/L (ref 135–145)
Total Bilirubin: 0.7 mg/dL (ref 0.3–1.2)
Total Protein: 6.9 g/dL (ref 6.5–8.1)

## 2020-03-17 LAB — CBC WITH DIFFERENTIAL/PLATELET
Abs Immature Granulocytes: 0.07 10*3/uL (ref 0.00–0.07)
Basophils Absolute: 0 10*3/uL (ref 0.0–0.1)
Basophils Relative: 1 %
Eosinophils Absolute: 1.6 10*3/uL — ABNORMAL HIGH (ref 0.0–0.5)
Eosinophils Relative: 20 %
HCT: 31.5 % — ABNORMAL LOW (ref 39.0–52.0)
Hemoglobin: 9.9 g/dL — ABNORMAL LOW (ref 13.0–17.0)
Immature Granulocytes: 1 %
Lymphocytes Relative: 15 %
Lymphs Abs: 1.1 10*3/uL (ref 0.7–4.0)
MCH: 22 pg — ABNORMAL LOW (ref 26.0–34.0)
MCHC: 31.4 g/dL (ref 30.0–36.0)
MCV: 70.2 fL — ABNORMAL LOW (ref 80.0–100.0)
Monocytes Absolute: 0.6 10*3/uL (ref 0.1–1.0)
Monocytes Relative: 8 %
Neutro Abs: 4.3 10*3/uL (ref 1.7–7.7)
Neutrophils Relative %: 55 %
Platelets: 451 10*3/uL — ABNORMAL HIGH (ref 150–400)
RBC: 4.49 MIL/uL (ref 4.22–5.81)
RDW: 17.9 % — ABNORMAL HIGH (ref 11.5–15.5)
WBC: 7.7 10*3/uL (ref 4.0–10.5)
nRBC: 0.8 % — ABNORMAL HIGH (ref 0.0–0.2)

## 2020-03-17 LAB — FERRITIN: Ferritin: 1222 ng/mL — ABNORMAL HIGH (ref 24–336)

## 2020-03-17 LAB — MAGNESIUM: Magnesium: 2 mg/dL (ref 1.7–2.4)

## 2020-03-17 LAB — PROTIME-INR
INR: 1.1 (ref 0.8–1.2)
Prothrombin Time: 14.5 seconds (ref 11.4–15.2)

## 2020-03-17 LAB — C-REACTIVE PROTEIN: CRP: 0.8 mg/dL (ref <1.0)

## 2020-03-17 LAB — PHOSPHORUS: Phosphorus: 3 mg/dL (ref 2.5–4.6)

## 2020-03-17 MED ORDER — DIPHENHYDRAMINE HCL 25 MG PO CAPS
25.0000 mg | ORAL_CAPSULE | Freq: Two times a day (BID) | ORAL | Status: AC
  Administered 2020-03-17 – 2020-03-18 (×2): 25 mg via ORAL
  Filled 2020-03-17 (×2): qty 1

## 2020-03-17 MED ORDER — SODIUM CHLORIDE 0.9 % IV SOLN
INTRAVENOUS | Status: DC | PRN
  Administered 2020-03-17: 250 mL via INTRAVENOUS

## 2020-03-17 MED ORDER — DIPHENHYDRAMINE HCL 25 MG PO CAPS
25.0000 mg | ORAL_CAPSULE | Freq: Two times a day (BID) | ORAL | Status: DC
  Administered 2020-03-17: 25 mg via ORAL
  Filled 2020-03-17: qty 1

## 2020-03-17 MED ORDER — HYDROXYZINE HCL 25 MG PO TABS
25.0000 mg | ORAL_TABLET | Freq: Two times a day (BID) | ORAL | Status: DC | PRN
  Administered 2020-03-18 (×2): 25 mg via ORAL
  Filled 2020-03-17 (×2): qty 1

## 2020-03-17 MED ORDER — HYDROCORTISONE 1 % EX CREA
TOPICAL_CREAM | Freq: Two times a day (BID) | CUTANEOUS | Status: DC
  Administered 2020-03-18: 1 via TOPICAL
  Filled 2020-03-17: qty 28

## 2020-03-17 MED ORDER — DIPHENHYDRAMINE HCL 25 MG PO CAPS: 25.0000 mg | ORAL_CAPSULE | Freq: Two times a day (BID) | ORAL | Status: DC | PRN

## 2020-03-17 NOTE — Progress Notes (Signed)
Patient pleasant and cooperative with assessment and meds last night. This RN could not find correct interpreter for patient, so patient's daughter assisted interpretation via telephone. Patient titrated down to O2 2L [place]. Patient tolerating well while resting in bed. Patient repeatedly mentioning shingles this AM and itching. This RN did not notice rash except for indentation to back from bedding. Patient denies pain and denies needing medication. Will continue to monitor.

## 2020-03-17 NOTE — Progress Notes (Signed)
   Subjective: Hospital day #8.  Interpreter is assisting with translation. Patient is doing well. Productive cough has been the same. No other complaint.  He complains of some itchy rashes on his left arm and back.  He has this rashes before, does not recall any triggers.  Cimetidine did some help for abdomen o other complaints today.  Objective:  Vital signs in last 24 hours: Vitals:   03/17/20 0505 03/17/20 0605 03/17/20 0902 03/17/20 0944  BP: 106/76  108/75   Pulse: 95 91 94   Resp: (!) 28 (!) 28 (!) 31 (!) 36  Temp: 98.5 F (36.9 C)  98.6 F (37 C)   TempSrc: Oral  Oral   SpO2: 93% 93% 92% (!) 84%  Weight:      Height:       Physical Exam  Constitutional: Well-developed and well-nourished. No acute distress.  Head: Normocephalic and atraumatic.  Eyes: Conjunctivae are normal, EOM nl Cardiovascular:  RRR, nl S1S2, no murmur,  no LEE Respiratory: On 2 L nasal cannula, in no respiratory distress, has diffuse crackle (improved), No wheezes.  GI: Soft. Bowel sounds are normal. No distension. There is no tenderness.  Neurological: Is alert and oriented x 3  Skin: Rash on left arm, upper and lower back Psychiatric: Normal mood and affect. Behavior is normal. Judgment and thought content normal.   Assessment/Plan:  Principal Problem:   Acute respiratory distress syndrome (ARDS) due to COVID-19 virus [facility]) Active Problems:   Pneumonia due to COVID-19 virus   Acute kidney injury ([place])  59 year old gentleman with past medical history of HTN, HFpEF, also thalassemia, recent hospitalization for COVID-19 infection, presented with acute hypoxic respiratory failure and ARDS.  Acute hypoxic respiratory failure/ARDS: This is hospital day 8. Clinically improved, however still has productive cough. O2 requirement improved to 4 L supplemental oxygen through nasal cannula. Inflammatory markers (except ferritin that trended up again from 900s to 1200) are trending down. He is on day 7 of  antibiotic for probable superimposed pneumonia.  Vancomycin 3/29> 4/3(MRSA negative), cefepime 3/29>. Procalcitonin is now normal.  No fever and no leukocytosis. He is a status postTocilizomab. He is on intermediate dose of anticoagulation for VTE prophylaxis.  -Continue supportive care for COVID-19 infection -Continue airborne and contact precautions -Continue as needed oxygen supplement to keep O2 saturation more than 92% -Stop Cefepime after completing today's dose (started 3/29) -If no improvement, will consider Decadron. -Continue incentive spirometry -Continue ambulation with PT -Continue Tessalon and Robitussin -Monitor inflammatory markers and LFT  Rash:  Itching rash on left arm, upper and lower back.  No vesicles. Can be heat rash regular on his back.  Less likely to be allergic reaction to antibiotic given distribution, however today will be the last of the his antibiotic.  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

## 2020-03-17 NOTE — Progress Notes (Addendum)
SATURATION QUALIFICATIONS: (This note is used to comply with regulatory documentation for home oxygen)  Patient Saturations on Room Air at Rest = 85%  Patient Saturations on Room Air while Ambulating = not tested; pt hypoxic on 2L.  Patient Saturations on 2 Liters of oxygen while Ambulating = 84%  Please briefly explain why patient needs home oxygen: pt transferred from bed to chair and became severely short of breath with increased work of breathing. After in chair, pt started to have dry cough. Oxygen increased to 4lpm [place] to improve saturations. Unable to tolerate more than a few feet.

## 2020-03-18 LAB — COMPREHENSIVE METABOLIC PANEL
ALT: 123 U/L — ABNORMAL HIGH (ref 0–44)
AST: 77 U/L — ABNORMAL HIGH (ref 15–41)
Albumin: 2.4 g/dL — ABNORMAL LOW (ref 3.5–5.0)
Alkaline Phosphatase: 153 U/L — ABNORMAL HIGH (ref 38–126)
Anion gap: 9 (ref 5–15)
BUN: 14 mg/dL (ref 6–20)
CO2: 27 mmol/L (ref 22–32)
Calcium: 8.5 mg/dL — ABNORMAL LOW (ref 8.9–10.3)
Chloride: 99 mmol/L (ref 98–111)
Creatinine, Ser: 0.98 mg/dL (ref 0.61–1.24)
GFR calc Af Amer: >60 mL/min (ref >60)
GFR calc non Af Amer: >60 mL/min (ref >60)
Glucose, Bld: 96 mg/dL (ref 70–99)
Potassium: 3.9 mmol/L (ref 3.5–5.1)
Sodium: 135 mmol/L (ref 135–145)
Total Bilirubin: 0.7 mg/dL (ref 0.3–1.2)
Total Protein: 6.3 g/dL — ABNORMAL LOW (ref 6.5–8.1)

## 2020-03-18 LAB — CBC WITH DIFFERENTIAL/PLATELET
Abs Immature Granulocytes: 0.12 10*3/uL — ABNORMAL HIGH (ref 0.00–0.07)
Basophils Absolute: 0 10*3/uL (ref 0.0–0.1)
Basophils Relative: 0 %
Eosinophils Absolute: 1.7 10*3/uL — ABNORMAL HIGH (ref 0.0–0.5)
Eosinophils Relative: 19 %
HCT: 29.2 % — ABNORMAL LOW (ref 39.0–52.0)
Hemoglobin: 9 g/dL — ABNORMAL LOW (ref 13.0–17.0)
Immature Granulocytes: 1 %
Lymphocytes Relative: 16 %
Lymphs Abs: 1.4 10*3/uL (ref 0.7–4.0)
MCH: 21.5 pg — ABNORMAL LOW (ref 26.0–34.0)
MCHC: 30.8 g/dL (ref 30.0–36.0)
MCV: 69.9 fL — ABNORMAL LOW (ref 80.0–100.0)
Monocytes Absolute: 0.8 10*3/uL (ref 0.1–1.0)
Monocytes Relative: 9 %
Neutro Abs: 4.7 10*3/uL (ref 1.7–7.7)
Neutrophils Relative %: 55 %
Platelets: 393 10*3/uL (ref 150–400)
RBC: 4.18 MIL/uL — ABNORMAL LOW (ref 4.22–5.81)
RDW: 18.2 % — ABNORMAL HIGH (ref 11.5–15.5)
WBC: 8.8 10*3/uL (ref 4.0–10.5)
nRBC: 0.7 % — ABNORMAL HIGH (ref 0.0–0.2)

## 2020-03-18 LAB — FERRITIN: Ferritin: 992 ng/mL — ABNORMAL HIGH (ref 24–336)

## 2020-03-18 LAB — PHOSPHORUS: Phosphorus: 2.9 mg/dL (ref 2.5–4.6)

## 2020-03-18 LAB — PROTIME-INR
INR: 1.1 (ref 0.8–1.2)
Prothrombin Time: 14.3 seconds (ref 11.4–15.2)

## 2020-03-18 LAB — MAGNESIUM: Magnesium: 2 mg/dL (ref 1.7–2.4)

## 2020-03-18 LAB — D-DIMER, QUANTITATIVE: D-Dimer, Quant: 7.43 ug/mL-FEU — ABNORMAL HIGH (ref 0.00–0.50)

## 2020-03-18 LAB — C-REACTIVE PROTEIN: CRP: 0.6 mg/dL (ref <1.0)

## 2020-03-18 MED ORDER — GUAIFENESIN-CODEINE 100-10 MG/5ML PO SOLN: 10.0000 mL | Freq: Four times a day (QID) | ORAL | Status: DC

## 2020-03-18 MED ORDER — GUAIFENESIN-CODEINE 100-10 MG/5ML PO SOLN: 10.0000 mL | Freq: Four times a day (QID) | ORAL | Status: DC | PRN

## 2020-03-18 MED ORDER — GUAIFENESIN-CODEINE 100-10 MG/5ML PO SOLN
5.0000 mL | Freq: Four times a day (QID) | ORAL | Status: DC
  Filled 2020-03-18: qty 10

## 2020-03-18 MED ORDER — AMLODIPINE BESYLATE 2.5 MG PO TABS: 2.5000 mg | ORAL_TABLET | Freq: Every day | ORAL | 0 refills | Status: DC

## 2020-03-18 MED ORDER — LIDOCAINE 5 % EX PTCH
1.0000 | MEDICATED_PATCH | CUTANEOUS | Status: DC
  Administered 2020-03-18: 1 via TRANSDERMAL
  Filled 2020-03-18: qty 1

## 2020-03-18 MED ORDER — DIPHENHYDRAMINE HCL 25 MG PO CAPS: 25.0000 mg | ORAL_CAPSULE | Freq: Three times a day (TID) | ORAL | Status: DC | PRN

## 2020-03-18 MED ORDER — DICLOFENAC SODIUM 1 % EX GEL: 2.0000 g | Freq: Four times a day (QID) | CUTANEOUS | 0 refills | Status: DC | PRN

## 2020-03-18 MED ORDER — GUAIFENESIN-CODEINE 100-10 MG/5ML PO SOLN: 5.0000 mL | Freq: Four times a day (QID) | ORAL | 0 refills | Status: DC

## 2020-03-18 MED ORDER — GUAIFENESIN-CODEINE 100-10 MG/5ML PO SOLN
5.0000 mL | Freq: Four times a day (QID) | ORAL | Status: DC
  Administered 2020-03-18: 10 mL via ORAL

## 2020-03-18 MED ORDER — GUAIFENESIN-CODEINE 100-10 MG/5ML PO SOLN: 5.0000 mL | Freq: Four times a day (QID) | ORAL | Status: DC | PRN

## 2020-03-18 MED ORDER — HYDROCORTISONE 1 % EX CREA: TOPICAL_CREAM | Freq: Two times a day (BID) | CUTANEOUS | 0 refills | Status: DC

## 2020-03-18 MED ORDER — LISINOPRIL 40 MG PO TABS: 40.0000 mg | ORAL_TABLET | Freq: Every day | ORAL | 3 refills | Status: DC

## 2020-03-18 MED ORDER — LIDOCAINE 5 % EX PTCH: 1.0000 | MEDICATED_PATCH | CUTANEOUS | 0 refills | Status: DC

## 2020-03-18 MED ORDER — DICLOFENAC SODIUM 1 % EX GEL: 2.0000 g | Freq: Four times a day (QID) | CUTANEOUS | 0 refills | Status: AC | PRN

## 2020-03-18 NOTE — Progress Notes (Addendum)
Physical Therapy Treatment Patient Details Name: Eric Ingram MRN: [id number] DOB: Oct 31, 1961 Today's Date: 03/18/2020    History of Present Illness 59 year old male with PMH HTN, CHF and recent admission for Covid 19, admitted with worsening SOB.  ABG's consistent with ARDS, CT shows worsening bilateral diffuse infiltrates.  Work up includes PNA, ARDS and AKI.    PT Comments    Patient desats to 83-84% on room air with mobility. 3L [place] for ambulation in hallway 79ft with one standing rest break, down to 86% at end of gait trial. Patient requires contact guard assistance due to mild balance deficits and it appears LLE weaker than RLE during gait trial. May trial RW next session if patient's mobility is not improved. Education and cues for improved breathing technique. Continued recommendation for skilled PT services at home and 24/7 supervision/assist.   Follow Up Recommendations  Home health PT;Supervision/Assistance - 24 hour     Equipment Recommendations  Other (comment)(may trial RW next session)       Precautions / Restrictions Precautions Precautions: Fall;Other (comment) Precaution Comments: monitor oxygen saturation Restrictions Weight Bearing Restrictions: No    Mobility  Bed Mobility    General bed mobility comments: Patient already sitting up in chair  Transfers Overall transfer level: Needs assistance Equipment used: None Transfers: Sit to/from Stand Sit to Stand: Min guard;Supervision  General transfer comment: sit<>stand from recliner chair, sit<>stand from standard chair  Ambulation/Gait Ambulation/Gait assistance: Min guard Gait Distance (Feet): 60 Feet(10) Assistive device: None Gait Pattern/deviations: Decreased step length - left;Step-through pattern(increased swing time LLE) Gait velocity: decreased   General Gait Details: Patient desatting on 2L with ambulation so placed on 3L suppl oxygen. One standing rest break during 65ft gait trial after approx  53ft. Down to 86% at end of gait trial, HR 134 bpm. LLE appears weaker than RLE during gait but patient denies LLE feeling weaker than RLE      Balance Overall balance assessment: Needs assistance Sitting-balance support: Feet supported;No upper extremity supported Sitting balance-Leahy Scale: Good     Standing balance support: No upper extremity supported Standing balance-Leahy Scale: (Good-)     Cognition Arousal/Alertness: Awake/alert Behavior During Therapy: WFL for tasks assessed/performed Overall Cognitive Status: Within Functional Limits for tasks assessed     Exercises Other Exercises Other Exercises: flutter x 2 trials Other Exercises: incentive spirometer x 5 trials, max 723mL, cues for improved technique with return demo    General Comments General comments (skin integrity, edema, etc.): Use of [place] interpreter Hulan Amato [phone]. Patient on room air at rest 89-91%, down to 83-84% on room air for ambulation in room approx 10 feet, HR 126 bpm, RR 40. 3L for ambulation 77ft with one standing rest break, down to 86% at end of gait trial, HR 134 bpm. On 2L at end of session and O2 saturation 91%.      Pertinent Vitals/Pain Pain Assessment: Faces Faces Pain Scale: Hurts a little bit Pain Location: R pec/chest (from coughing per OT) Pain Descriptors / Indicators: Guarding;Discomfort Pain Intervention(s): Monitored during session;Limited activity within patient's tolerance     PT Goals (current goals can now be found in the care plan section) Progress towards PT goals: Progressing toward goals    Frequency    Min 3X/week      PT Plan Current plan remains appropriate    Co-evaluation PT/OT/SLP Co-Evaluation/Treatment: Yes(for part of the session) Reason for Co-Treatment: Other (comment);Complexity of the patient's impairments (multi-system involvement)(Vietnamese speaking, decreased tolerance)  AM-PAC PT "6 Clicks" Mobility   Outcome Measure  Help  needed turning from your back to your side while in a flat bed without using bedrails?: None Help needed moving from lying on your back to sitting on the side of a flat bed without using bedrails?: None Help needed moving to and from a bed to a chair (including a wheelchair)?: None Help needed standing up from a chair using your arms (e.g., wheelchair or bedside chair)?: A Little Help needed to walk in hospital room?: A Little Help needed climbing 3-5 steps with a railing? : A Little 6 Click Score: 21    End of Session Equipment Utilized During Treatment: Gait belt;Oxygen Activity Tolerance: Patient tolerated treatment well;Patient limited by fatigue Patient left: in chair;with call bell/phone within reach Nurse Communication: Mobility status;Other (comment)(oxygen saturation) PT Visit Diagnosis: Difficulty in walking, not elsewhere classified (R26.2);Unsteadiness on feet (R26.81)     Time: [id number] PT Time Calculation (min) (ACUTE ONLY): 28 min  Charges:  $Gait Training: 8-22 mins                     Birdie Hopes, PT, DPT Acute Rehab [phone] office    Birdie Hopes 03/18/2020, 12:19 PM

## 2020-03-18 NOTE — Progress Notes (Addendum)
   Subjective:   Interpreter is assisting with translation.  Patient is complaining of some bothering him.  And upper back pain. Rash improved with medication   Objective:  Vital signs in last 24 hours: Vitals:   03/18/20 0526 03/18/20 0620 03/18/20 0645 03/18/20 1006  BP: 118/83   (!) 114/91  Pulse: 87 92  (!) 111  Resp: (!) 31 20  20   Temp: 97.9 F (36.6 C)  97.8 F (36.6 C)   TempSrc: Oral  Oral   SpO2: 93% 94%  99%  Weight:      Height:        Physical Exam  Constitutional: Well-developed and well-nourished. No acute distress.  HENT:  Head: Normocephalic and atraumatic.  Cardiovascular:  RRR, nl S1S2, no murmur,  no LEE Respiratory: On 1 L nasal cannula, no respiratory distress, diffuse crackles improved, no wheezes.  GI: Soft. Bowel sounds are normal. No distension. There is no tenderness.  Neurological: Is alert and oriented x 3 Skin: Not diaphoretic. No erythema.  Psychiatric: Normal mood and affect. Behavior is normal. Judgment and thought content normal.   Assessment/Plan:  Principal Problem:   Acute respiratory distress syndrome (ARDS) due to COVID-19 virus [facility]) Active Problems:   Pneumonia due to COVID-19 virus   Acute kidney injury ([place])  59 year old gentleman with past medical history of HTN, HFpEF, also thalassemia, recent hospitalization for COVID-19 infection, presented with acute hypoxic respiratory failure and ARDS.  Acute hypoxic respiratory failure/ARDS:  He completed 7 days of antibiotic as below for probable superimposed pneumonia.  Remained afebrile without leukocytosis and procalcitonin is now negative(: Vancomycin 3/29> 4/3(MRSA negative), cefepime 3/29> 4/4 He is a status postTocilizomab. Inflammatory markers trended down.   LFT imroved  Clinically improved. -DC home today  Rash:  Improved with benadryl and hydroxyzine and topical Hydrocortisone. Itching rash on left arm, upper and lower back.  No vesicles.   Macrocytic  anemia: Likely in setting of thalassemia minor Hb stable at 9 -CBC daily -Out pt follow up fpr serial CBC   Prior to Admission Living Arrangement: Anticipated Discharge Location: Barriers to Discharge: Dispo: Anticipated discharge in approximately 2-3 days   Dewayne Hatch, MD 03/18/2020, 11:19 AM Pager: [phone]

## 2020-03-18 NOTE — Progress Notes (Signed)
Patient given discharge instructions and teaching, interpreter used. No new questions or concerns. Tele removed. Family to transport home, awaiting ride.

## 2020-03-18 NOTE — Progress Notes (Signed)
Oxygen Therapy   Treatment Indications: Covid PNA, desat to 82 on room air    Treatment Goals: Maintain O2 above 88%    Plan of Care: Needs 2L O2 during ambulation to maintain sats above 89%

## 2020-03-18 NOTE — Discharge Summary (Signed)
Name: Eric Ingram MRN: [id number] DOB: 06/10/1961 59 y.o. PCP: Eric Plater, MD  Date of Admission: 03/10/2020  7:52 PM Date of Discharge: 03/18/2020 Attending Physician: No att. providers found  Discharge Diagnosis: Principal Problem:   Acute respiratory distress syndrome (ARDS) due to COVID-19 virus [facility]) Active Problems:   Pneumonia due to COVID-19 virus   Acute kidney injury [facility])   Discharge Medications: Allergies as of 03/18/2020   No Known Allergies     Medication List    STOP taking these medications   guaiFENesin-dextromethorphan 100-10 MG/5ML syrup Commonly known as: ROBITUSSIN DM     TAKE these medications   albuterol 108 (90 Base) MCG/ACT inhaler Commonly known as: VENTOLIN HFA Inhale 1-2 puffs into the lungs every 6 (six) hours as needed for wheezing or shortness of breath. Notes to patient: Resume home schedule 03/18/20   amLODipine 2.5 MG tablet Commonly known as: NORVASC Take 1 tablet (2.5 mg total) by mouth daily. Start taking on: March 25, 2020 What changed: These instructions start on March 25, 2020. If you are unsure what to do until then, ask your doctor or other care provider. Notes to patient: Resume home schedule 03/25/20   benzonatate 100 MG capsule Commonly known as: TESSALON Take 1 capsule (100 mg total) by mouth 3 (three) times daily. Notes to patient: Resume home schedule 03/18/20   diclofenac Sodium 1 % Gel Commonly known as: Voltaren Apply 2 g topically 4 (four) times daily as needed (to painful sites). Notes to patient: Resume home schedule 03/18/20   famotidine 20 MG tablet Commonly known as: PEPCID Take 1 tablet (20 mg total) by mouth 2 (two) times daily for 5 days. Notes to patient: Resume home schedule 03/18/20   fluticasone 50 MCG/ACT nasal spray Commonly known as: FLONASE Place 2 sprays into both nostrils daily. Notes to patient: Resume home schedule 03/18/20   gabapentin 300 MG capsule Commonly known as: NEURONTIN TAKE 2  CAPSULES BY MOUTH THREE TIMES DAILY. TAKE UP TO 2 CAPSULES IN THE MORNING, AFTERNOON, AND AT BEDTIME What changed: See the new instructions. Notes to patient: Resume home schedule 03/18/20   guaiFENesin-codeine 100-10 MG/5ML syrup Take 5-10 mLs by mouth every 6 (six) hours. Notes to patient: Resume home schedule 03/18/20   hydrocortisone cream 1 % Apply topically 2 (two) times daily. Notes to patient: Resume home schedule 03/18/20   lidocaine 5 % Commonly known as: LIDODERM Place 1 patch onto the skin daily. Remove & Discard patch within 12 hours or as directed by MD Notes to patient: Resume home schedule 03/19/20   lisinopril 40 MG tablet Commonly known as: ZESTRIL Take 1 tablet (40 mg total) by mouth daily. Start taking on: March 25, 2020 What changed: These instructions start on March 25, 2020. If you are unsure what to do until then, ask your doctor or other care provider. Notes to patient: Resume home schedule 03/25/20   metoprolol succinate 25 MG 24 hr tablet Commonly known as: TOPROL-XL Take 25 mg by mouth at bedtime. Notes to patient: Resume home schedule 03/18/20   naproxen 500 MG tablet Commonly known as: Naprosyn Take 1 tablet (500 mg total) by mouth 2 (two) times daily as needed. What changed: reasons to take this Notes to patient: Resume home schedule 03/18/20   pantoprazole 40 MG tablet Commonly known as: PROTONIX Take 1 tablet (40 mg total) by mouth 2 (two) times daily. What changed: when to take this Notes to patient: Resume home schedule 03/18/20   sucralfate 1  GM/10ML suspension Commonly known as: Carafate Take 10 mLs (1 g total) by mouth 4 (four) times daily -  with meals and at bedtime. Notes to patient: Resume home schedule 03/18/20       Disposition and follow-up:   [name] was discharged from [facility] in fair condition.  At the hospital follow up visit please address:  1.  Acute hypoxic respiratory failure/ARDS due to COVID-19  PNA:  Please evaluate respiratory symptoms, lung exam and Pulse oxymetry with ambulation. Make sure patient follows post COVID percussions. He had elevated liver enzyme and AKI that improved before discharge. Please recheck liver and kidney function  2.  Labs / imaging needed at time of follow-up: CMP, inflammatory markers (Ferritin, CRP) 3.  Pending labs/ test needing follow-up:   Follow-up Appointments: Follow-up Information    Eric Plater, MD Follow up in 5 day(s).   Specialty: Internal Medicine Contact information: [address] [place] [address] [phone]           Hospital Course by problem list: 59 year old gentleman with past medical history of HTN, HFpEF, also thalassemia, recent hospitalization for COVID-19 infection s/p steroid and Remdisivire therapy, presented with productive cough, SOB and found to have acute hypoxic respiratory failure and ARDS.   Acute hypoxic respiratory failure/ARDS due to COVID-19 PNA: He required up to 15 li O2 through [facility]. He received supportive care with supplemental O2, Tessalon and Robitussin>Codeine, Tocilizumab, and ABx for superimposed bacterial PNA (mild-moderately elevated Pro calcitonin). Patient did not receive Remdesivir and Steroid at this hospitalization as he had recently received those treatment on prior hospitalization. He also received ABx for probable superimposed PNA given. (7 days of cefepime and 5 days of Vancomycin that stopped with negative MRSA). Procalcitonin normalized at discharge. He received intermediate dose of anticoagulation for VTE prophylaxis during this hospitalization.  He clinically improved. His O2 requirement came down to 0-2 li [place] at discharge. He is discharged with home O2, cough medicine and to f/u with in [facility].  Rash: Pruritic rashes on left arm, upper and lower back.  No vesicles. Can be heat rash regular on his back.  Less likely to be allergic reaction to antibiotic  given distribution, however today will be the last of the his antibiotic. Improved with Benadryl, Topical hydrocortisone 1% and hydroxyzine    Discharge Vitals:   BP 126/84 (BP Location: Left Arm)   Pulse (!) 118   Temp 98.3 F (36.8 C) (Oral)   Resp 20   Ht 5\' 2"  (1.575 m)   Wt 74.8 kg   SpO2 90%   BMI 30.18 kg/m   Pertinent Labs, Studies, and Procedures:  CMP Latest Ref Rng & Units 03/18/2020 03/17/2020 03/16/2020  Glucose 70 - 99 mg/dL 96 127(H) 100(H)  BUN 6 - 20 mg/dL 14 17 16   Creatinine 0.61 - 1.24 mg/dL 0.98 1.01 0.96  Sodium 135 - 145 mmol/L 135 135 133(L)  Potassium 3.5 - 5.1 mmol/L 3.9 4.0 4.4  Chloride 98 - 111 mmol/L 99 98 99  CO2 22 - 32 mmol/L 27 24 25   Calcium 8.9 - 10.3 mg/dL 8.5(L) 8.7(L) 8.4(L)  Total Protein 6.5 - 8.1 g/dL 6.3(L) 6.9 6.7  Total Bilirubin 0.3 - 1.2 mg/dL 0.7 0.7 0.8  Alkaline Phos 38 - 126 U/L 153(H) 196(H) 214(H)  AST 15 - 41 U/L 77(H) 109(H) 112(H)  ALT 0 - 44 U/L 123(H) 150(H) 132(H)     Discharge Instructions: Discharge Instructions    MyChart  COVID-19 home monitoring program   Complete by: Mar 18, 2020    Is the patient willing to use the [place] for home monitoring?: Yes   Call MD for:  difficulty breathing, headache or visual disturbances   Complete by: As directed    Call MD for:  persistant dizziness or light-headedness   Complete by: As directed    Call MD for:  persistant nausea and vomiting   Complete by: As directed    Call MD for:  temperature >100.4   Complete by: As directed    Diet - low sodium heart healthy   Complete by: As directed    Discharge instructions   Complete by: As directed    Thank you for allowing [place] taking care of you at [facility].  We are glad you feel better. I prescribed cough medication (guaifenesin-codeine) to be taken for cough as needed.  As we discussed, this is a controlled medication and can cause dizziness.  Do not drive or operate machines need attention when taking  this medicine.  Take this medicine just as needed for cough. Hold your blood pressure medicine and follow up with primary care doctor in 5-7 days to decide about resuming your meds. Self quarantine yourself for 10 days and use mask.   Face-to-face encounter (required for Medicare/Medicaid patients)   Complete by: As directed    I [place] certify that this patient is under my care and that I, or a nurse practitioner or physician's assistant working with me, had a face-to-face encounter that meets the physician face-to-face encounter requirements with this patient on 03/18/2020. The encounter with the patient was in whole, or in part for the following medical condition(s) which is the primary reason for home health care (List medical condition): covid-19. Requires O2.   The encounter with the patient was in whole, or in part, for the following medical condition, which is the primary reason for home health care: [id number]   I certify that, based on my findings, the following services are medically necessary home health services: Physical therapy   Reason for Medically Necessary Home Health Services: Other See Comments   My clinical findings support the need for the above services: Shortness of breath with activity   Further, I certify that my clinical findings support that this patient is homebound due to: Shortness of Breath with activity   Home Health   Complete by: As directed    To provide the following care/treatments:  PT OT Respiratory Care     Increase activity slowly   Complete by: As directed       Signed: Dewayne Hatch, MD 03/21/2020, 5:20 PM   Pager: @[phone] @

## 2020-03-18 NOTE — Progress Notes (Signed)
Occupational Therapy Treatment Patient Details Name: Eric Ingram MRN: [id number] DOB: 1961-03-26 Today's Date: 03/18/2020    History of present illness 59 year old male with PMH HTN, CHF and recent admission for Covid 19, admitted with worsening SOB.  ABG's consistent with ARDS, CT shows worsening bilateral diffuse infiltrates.  Work up includes PNA, ARDS and AKI.   OT comments  Pt progressing with OT goals and received on less O2 requirements today. Trialed on RA initially to assess cardiopulmonary tolerance during activity. Pt 89-91% at rest on RA, desats to 83-84% when walking to sink without AD. Pt min guard for mobility due to unsteadiness, supervision for grooming tasks while standing at sink. Provided education to pt on energy conservation strategies, specifically sitting during tasks and pursed lip breathing - encouraged pt to implement these strategies at home as well. HR up to 130s standing at sink - frequent encouragement needed to take seated rest breaks as pt eager to ambulate. Due to decreased recovery rate on RA, trialed 2-3 L O2 for remaining mobility reading at 86% at end of task. Reduced to 2 L O2 with pt stats at 91% after seated rest break. HHOT remains appropriate, as well as 24/7 supervision/assistance with tasks as needed. Will continue to follow acutely.    Follow Up Recommendations  Home health OT;Supervision/Assistance - 24 hour    Equipment Recommendations  3 in 1 bedside commode    Recommendations for Other Services      Precautions / Restrictions Precautions Precautions: Fall;Other (comment) Precaution Comments: monitor oxygen saturation Restrictions Weight Bearing Restrictions: No       Mobility Bed Mobility               General bed mobility comments: Patient already sitting up in chair  Transfers Overall transfer level: Needs assistance Equipment used: None Transfers: Sit to/from Stand;Stand Pivot Transfers Sit to Stand: Min  guard;Supervision Stand pivot transfers: Supervision       General transfer comment: sit<>stand from recliner chair, sit<>stand from standard chair    Balance                                           ADL either performed or assessed with clinical judgement   ADL Overall ADL's : Needs assistance/impaired     Grooming: Supervision/safety;Standing;Wash/dry face;Brushing hair Grooming Details (indicate cue type and reason): Supervision for monitoring of O2 and providing cues to take breaks as pt prefers to stand during tasks             Lower Body Dressing: Set up;Sitting/lateral leans Lower Body Dressing Details (indicate cue type and reason): Setup to don shoes              Functional mobility during ADLs: Min guard;Supervision/safety;Cueing for safety General ADL Comments: Pt requires cues to take seated rest breaks and slow breathing throughout     Vision       Perception     Praxis      Cognition Arousal/Alertness: Awake/alert Behavior During Therapy: WFL for tasks assessed/performed Overall Cognitive Status: Within Functional Limits for tasks assessed                                          Exercises Exercises: Other exercises Other Exercises Other Exercises: pursed lip  breathing    Shoulder Instructions       General Comments Use of [place] interpreter Hulan Amato [phone]. Patient on room air at rest 89-91%, down to 83-84% on room air for ambulation in room approx 10 feet, HR 126 bpm, RR 40. 3L for ambulation 53ft with one standing rest break, down to 86% at end of gait trial, HR 134 bpm. On 2L at end of session and O2 saturation 91%.    Pertinent Vitals/ Pain       Pain Assessment: Faces Faces Pain Scale: Hurts little more Pain Location: R pec/chest when coughing Pain Descriptors / Indicators: Guarding;Discomfort Pain Intervention(s): Monitored during session;Limited activity within patient's tolerance  Home  Living                                          Prior Functioning/Environment              Frequency  Min 3X/week        Progress Toward Goals  OT Goals(current goals can now be found in the care plan section)  Progress towards OT goals: Progressing toward goals  Acute Rehab OT Goals Patient Stated Goal: be able to walk without O2 OT Goal Formulation: With patient Time For Goal Achievement: 03/28/20 Potential to Achieve Goals: Good ADL Goals Pt Will Perform Grooming: Independently;standing Pt Will Perform Lower Body Bathing: with modified independence;sit to/from stand;sitting/lateral leans Pt Will Perform Lower Body Dressing: Independently;sit to/from stand;sitting/lateral leans Pt Will Transfer to Toilet: Independently;ambulating;regular height toilet Pt Will Perform Toileting - Clothing Manipulation and hygiene: Independently;sit to/from stand;sitting/lateral leans Additional ADL Goal #1: Pt will demonstrate implementation of energy conservation strategies at least 3 times during daily tasks to maximize independence.  Plan Discharge plan remains appropriate    Co-evaluation    PT/OT/SLP Co-Evaluation/Treatment: Yes Reason for Co-Treatment: Complexity of the patient's impairments (multi-system involvement);Other (comment)([place]namese-speaking, decreased tolerance)   OT goals addressed during session: ADL's and self-care      AM-PAC OT "6 Clicks" Daily Activity     Outcome Measure   Help from another person eating meals?: None Help from another person taking care of personal grooming?: A Little Help from another person toileting, which includes using toliet, bedpan, or urinal?: A Little Help from another person bathing (including washing, rinsing, drying)?: A Little Help from another person to put on and taking off regular upper body clothing?: A Little Help from another person to put on and taking off regular lower body clothing?: A Little 6  Click Score: 19    End of Session Equipment Utilized During Treatment: Oxygen;Gait belt  OT Visit Diagnosis: Unsteadiness on feet (R26.81);Other (comment)(decreased activity tolerance )   Activity Tolerance Patient tolerated treatment well;Patient limited by fatigue   Patient Left in chair;with call bell/phone within reach;Other (comment)(With PT)   Nurse Communication Mobility status        Time: [phone] OT Time Calculation (min): 35 min  Charges: OT General Charges $OT Visit: 1 Visit OT Treatments $Self Care/Home Management : 8-22 mins  Layla Maw, OTR/L   Layla Maw 03/18/2020, 3:37 PM

## 2020-03-18 NOTE — TOC Progression Note (Addendum)
Transition of Care [facility]) - Progression Note    Patient Details  Name: Eric Ingram MRN: [id number] Date of Birth: [birth year]  [facility] CM/SW Contact  Maryclare Labrador, RN Phone Number: 03/18/2020, 3:22 PM  Clinical Narrative:   CM unable to reach pt via phone. [facility] aware of pt potentially discharging home today - agency accepts [facility] orders as written.   Daughter confirms that pt will have 24 hour supervision at discharge.   Daughter informed CM that pt is in agreement with HH. Daughter declined covid at home program.  Daughter will bring portable oxygen tank for transport home.  CM requested pulm ambulatory note documentation by bedside nurse for home oxygen needs -pts baseline is 2 liters    Pt remains uninsured.  CM provided MATCH and requested meds to be transferred to [facility].    Bedside nurse confirms that pt will discharge home on baseline 2 liters of home oxygen.  No other needs determined CM signing off    Expected Discharge Plan: [place]    Expected Discharge Plan and Services Expected Discharge Plan: [place] arrangements for the past 2 months: Single Family Home Expected Discharge Date: 03/18/20                         HH Arranged: PT, OT HH Agency: [facility] [facility] (now [facility]) Date [place]: 03/14/20 Time [place]: [place] Representative spoke with at [place]: [place]) Interventions    Readmission Risk Interventions No flowsheet data found.

## 2020-03-19 ENCOUNTER — Telehealth: Payer: Self-pay | Admitting: Internal Medicine

## 2020-03-19 LAB — PATHOLOGIST SMEAR REVIEW

## 2020-03-19 NOTE — Telephone Encounter (Signed)
[facility] APPT 03/25/2020 @ 1:45PM WITH PCP DR Sheppard Coil

## 2020-03-22 ENCOUNTER — Telehealth: Payer: Self-pay | Admitting: *Deleted

## 2020-03-22 NOTE — Telephone Encounter (Signed)
Pt calls and thru HME's translation he states he is short of breath, has a rash over his body and hard to swallow. He and his wife were made aware to call 911 or go to ED asap. HME spoke with the daughter and encouraged greatly going to ED in case of allergic reaction to med or other. States she will take him now

## 2020-03-23 ENCOUNTER — Emergency Department (HOSPITAL_COMMUNITY)
Admission: EM | Admit: 2020-03-23 | Discharge: 2020-03-23 | Disposition: A | Discharge status: Home / Self Care | Payer: Self-pay | Attending: Emergency Medicine | Admitting: Emergency Medicine

## 2020-03-23 ENCOUNTER — Encounter (HOSPITAL_COMMUNITY): Payer: Self-pay | Admitting: Emergency Medicine

## 2020-03-23 ENCOUNTER — Other Ambulatory Visit: Payer: Self-pay

## 2020-03-23 ENCOUNTER — Emergency Department (HOSPITAL_COMMUNITY): Payer: Self-pay

## 2020-03-23 DIAGNOSIS — Z8616 Personal history of COVID-19: Secondary | ICD-10-CM | POA: Insufficient documentation

## 2020-03-23 DIAGNOSIS — Z79899 Other long term (current) drug therapy: Secondary | ICD-10-CM | POA: Insufficient documentation

## 2020-03-23 DIAGNOSIS — R079 Chest pain, unspecified: Secondary | ICD-10-CM | POA: Insufficient documentation

## 2020-03-23 DIAGNOSIS — Z87891 Personal history of nicotine dependence: Secondary | ICD-10-CM | POA: Insufficient documentation

## 2020-03-23 DIAGNOSIS — R0602 Shortness of breath: Secondary | ICD-10-CM | POA: Insufficient documentation

## 2020-03-23 DIAGNOSIS — L509 Urticaria, unspecified: Secondary | ICD-10-CM | POA: Insufficient documentation

## 2020-03-23 DIAGNOSIS — I5032 Chronic diastolic (congestive) heart failure: Secondary | ICD-10-CM | POA: Insufficient documentation

## 2020-03-23 DIAGNOSIS — I110 Hypertensive heart disease with heart failure: Secondary | ICD-10-CM | POA: Insufficient documentation

## 2020-03-23 LAB — BASIC METABOLIC PANEL
Anion gap: 11 (ref 5–15)
BUN: 8 mg/dL (ref 6–20)
CO2: 24 mmol/L (ref 22–32)
Calcium: 8.9 mg/dL (ref 8.9–10.3)
Chloride: 103 mmol/L (ref 98–111)
Creatinine, Ser: 0.94 mg/dL (ref 0.61–1.24)
GFR calc Af Amer: >60 mL/min (ref >60)
GFR calc non Af Amer: >60 mL/min (ref >60)
Glucose, Bld: 99 mg/dL (ref 70–99)
Potassium: 4.5 mmol/L (ref 3.5–5.1)
Sodium: 138 mmol/L (ref 135–145)

## 2020-03-23 LAB — TROPONIN I (HIGH SENSITIVITY)
Troponin I (High Sensitivity): 5 ng/L (ref <18)
Troponin I (High Sensitivity): 5 ng/L (ref <18)

## 2020-03-23 LAB — CBC
HCT: 37.4 % — ABNORMAL LOW (ref 39.0–52.0)
Hemoglobin: 11.5 g/dL — ABNORMAL LOW (ref 13.0–17.0)
MCH: 22.3 pg — ABNORMAL LOW (ref 26.0–34.0)
MCHC: 30.7 g/dL (ref 30.0–36.0)
MCV: 72.6 fL — ABNORMAL LOW (ref 80.0–100.0)
Platelets: 475 10*3/uL — ABNORMAL HIGH (ref 150–400)
RBC: 5.15 MIL/uL (ref 4.22–5.81)
RDW: 21 % — ABNORMAL HIGH (ref 11.5–15.5)
WBC: 13.1 10*3/uL — ABNORMAL HIGH (ref 4.0–10.5)
nRBC: 0.4 % — ABNORMAL HIGH (ref 0.0–0.2)

## 2020-03-23 IMAGING — DX DG CHEST 2V
2 series · 2 of 2 positions shown · non-contrast
Comparison: [DATE]

CLINICAL DATA: Chest pain

EXAM:
CHEST - 2 VIEW

[chest lat]
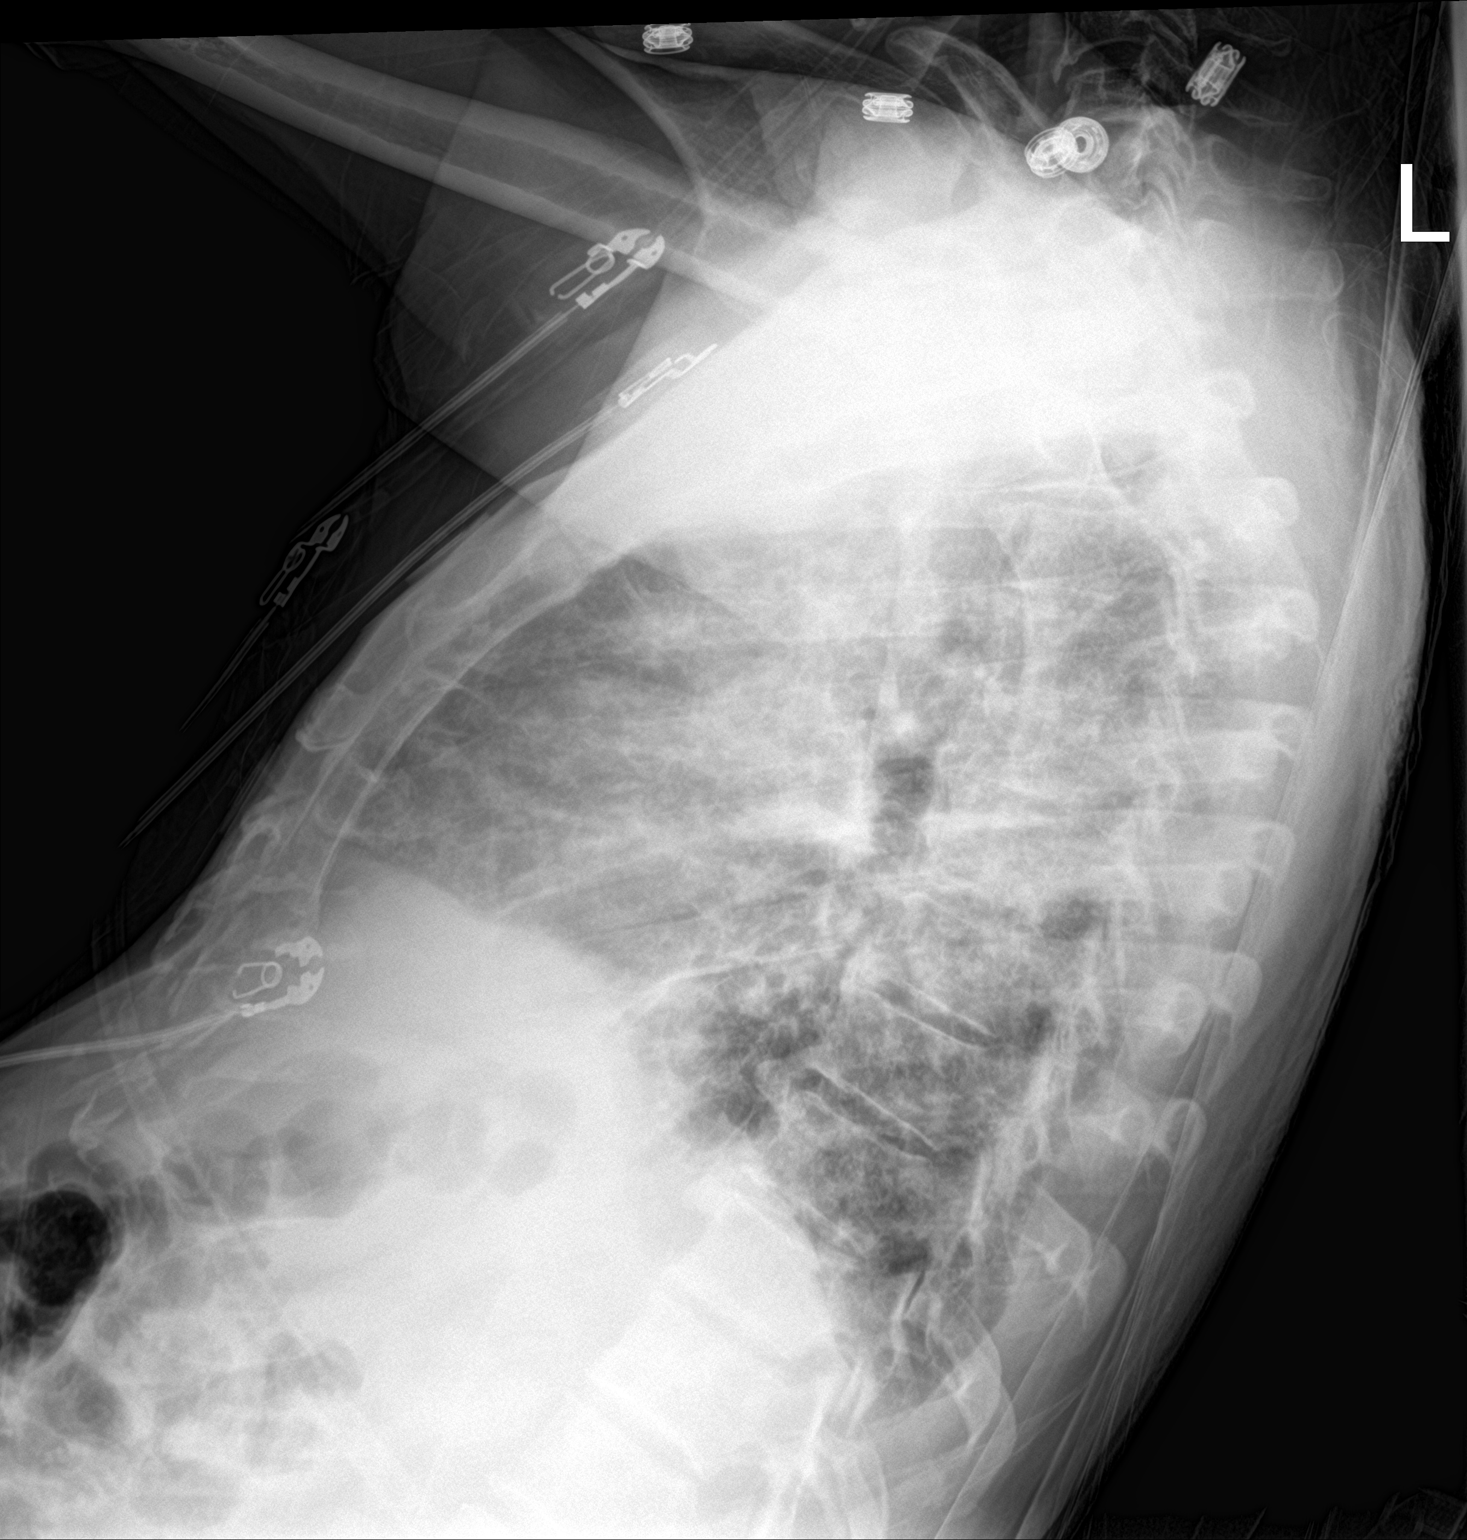

[chest ap]
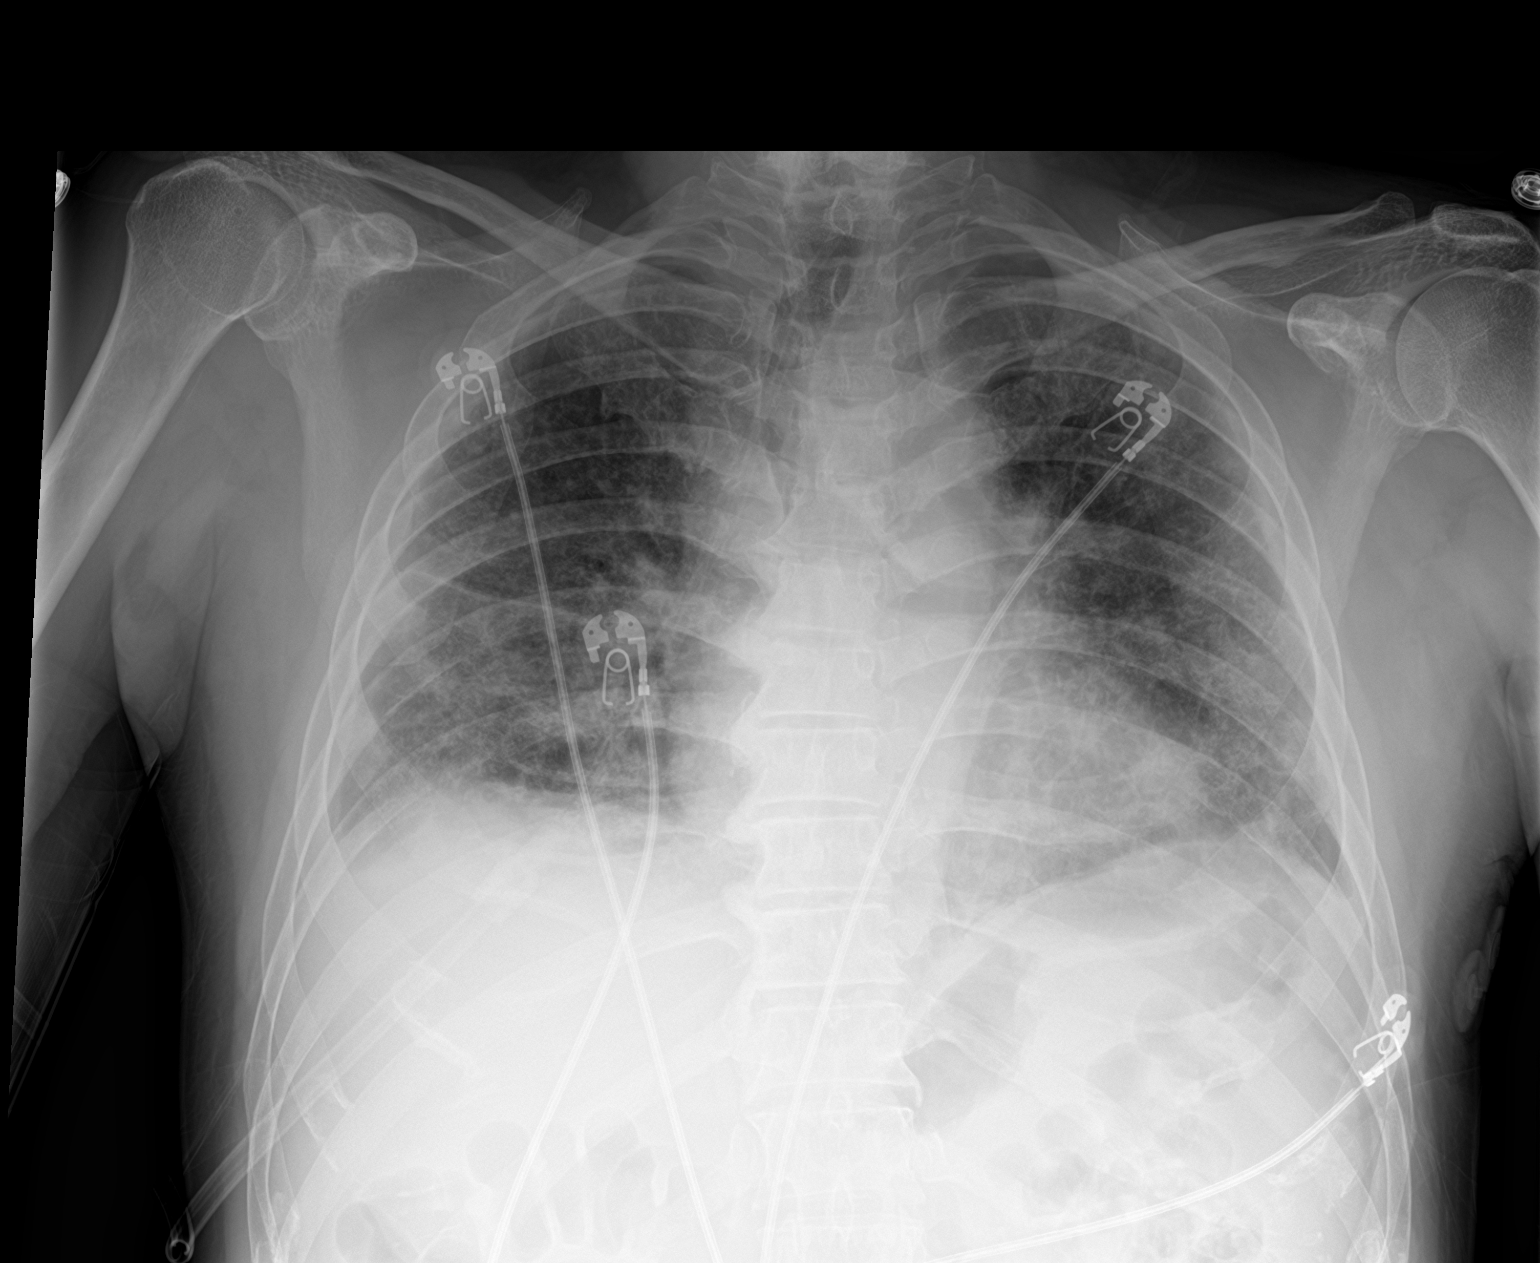

[2 of 2 positions shown; findings below may reference images not displayed]

FINDINGS: The heart size and mediastinal contours are within normal limits.
Low lung volumes. Patchy bilateral lung infiltrates persist,
although have improved compared to previous x-ray. Small right
pleural effusion. No pneumothorax. The visualized skeletal
structures are unremarkable.
IMPRESSION: Persistent but improved bilateral patchy lung infiltrates. Small
right pleural effusion.

## 2020-03-23 MED ORDER — SODIUM CHLORIDE 0.9% FLUSH: 3.0000 mL | Freq: Once | INTRAVENOUS | Status: DC

## 2020-03-23 NOTE — ED Provider Notes (Signed)
[facility] EMERGENCY DEPARTMENT Provider Note   CSN: [id number] Arrival date & time: 03/23/20  [id number]     History Chief Complaint  Patient presents with  . Rash  . Chest Pain  . Shortness of Breath    Eric Ingram is a 59 y.o. male.  The history is provided by the patient. No language interpreter was used.  Rash Location:  Full body Quality: itchiness   Severity:  Moderate Duration:  1 day Timing:  Constant Progression:  Worsening Chronicity:  New Context: medications   Relieved by:  Nothing Worsened by:  Nothing Ineffective treatments:  None tried Associated symptoms: shortness of breath   Chest Pain Associated symptoms: shortness of breath   Shortness of Breath Associated symptoms: chest pain and rash   Pt had a rash yesterday after taking cough medication.  Pt was advised yesterday to come to the ED.  Rash has resolved today.  Pt is on oxygen at home and continues to be short of breath.  Pt has had chest pain since being in the hospital.  Pain is worse when he takes oxygen off and improves with oxygen      Past Medical History:  Diagnosis Date  . Anemia   . CHF (congestive heart failure) ([place])   . Dental infection 08/02/2018  . GERD (gastroesophageal reflux disease)   . Hypertension   . Shingles rash 07/07/2019  . Thalassemia, alpha ([place])   . Viral gastritis 02/08/2019    Patient Active Problem List   Diagnosis Date Noted  . Acute kidney injury ([place]) 03/11/2020  . Acute respiratory distress syndrome (ARDS) due to COVID-19 virus ([place]) 03/10/2020  . Pneumonia due to COVID-19 virus 02/22/2020  . Cervical radiculopathy 09/12/2019  . Post herpetic neuralgia 08/29/2019  . EKG abnormalities 08/29/2019  . Adhesive capsulitis of both shoulders 08/29/2019  . Gastro-esophageal reflux 07/18/2019  . Wheezing on both sides of chest 02/16/2019  . Cough 02/08/2019  . Constipation 02/08/2019  . Shoulder pain, left 02/08/2019  . Abdominal bloating  11/07/2018  . Acute on chronic diastolic heart failure ([place])   . Iron deficiency anemia due to chronic blood loss   . Chronic sinusitis 01/05/2018  . Health care maintenance 01/05/2018  . Hypertension 10/16/2017    Past Surgical History:  Procedure Laterality Date  . NO PAST SURGERIES         Family History  Problem Relation Age of Onset  . Colon cancer Neg Hx   . Stomach cancer Neg Hx     Social History   Tobacco Use  . Smoking status: Former Smoker    Quit date: 12/15/1999    Years since quitting: 20.2  . Smokeless tobacco: Never Used  Substance Use Topics  . Alcohol use: No    Comment: former  . Drug use: No    Home Medications Prior to Admission medications   Medication Sig Start Date End Date Taking? Authorizing Provider  albuterol (VENTOLIN HFA) 108 (90 Base) MCG/ACT inhaler Inhale 1-2 puffs into the lungs every 6 (six) hours as needed for wheezing or shortness of breath. 03/04/20   Joy, Shawn C, PA-C  amLODipine (NORVASC) 2.5 MG tablet Take 1 tablet (2.5 mg total) by mouth daily. 03/25/20   Velna Ochs, MD  benzonatate (TESSALON) 100 MG capsule Take 1 capsule (100 mg total) by mouth 3 (three) times daily. 02/26/20   Katherine Roan, MD  diclofenac Sodium (VOLTAREN) 1 % GEL Apply 2 g topically 4 (four) times daily as  needed (to painful sites). 03/18/20 04/17/20  Velna Ochs, MD  famotidine (PEPCID) 20 MG tablet Take 1 tablet (20 mg total) by mouth 2 (two) times daily for 5 days. Patient not taking: Reported on 03/12/2020 03/04/20 03/12/20  Joy, Raquel Sarna C, PA-C  fluticasone (FLONASE) 50 MCG/ACT nasal spray Place 2 sprays into both nostrils daily. 10/11/18 03/12/20  Kathi Ludwig, MD  gabapentin (NEURONTIN) 300 MG capsule TAKE 2 CAPSULES BY MOUTH THREE TIMES DAILY. TAKE UP TO 2 CAPSULES IN THE MORNING, AFTERNOON, AND AT BEDTIME Patient taking differently: Take 300-600 mg by mouth See admin instructions. Take 300 mg by mouth in the morning, 600 mg at noon, and  300 mg at bedtime 12/22/19   Axel Filler, MD  guaiFENesin-codeine 100-10 MG/5ML syrup Take 5-10 mLs by mouth every 6 (six) hours. 03/18/20   Velna Ochs, MD  hydrocortisone cream 1 % Apply topically 2 (two) times daily. 03/18/20   Velna Ochs, MD  lidocaine (LIDODERM) 5 % Place 1 patch onto the skin daily. Remove & Discard patch within 12 hours or as directed by MD 03/19/20   Velna Ochs, MD  lisinopril (ZESTRIL) 40 MG tablet Take 1 tablet (40 mg total) by mouth daily. 03/25/20   Velna Ochs, MD  metoprolol succinate (TOPROL-XL) 25 MG 24 hr tablet Take 25 mg by mouth at bedtime.  02/02/20   [provider]  naproxen (NAPROSYN) 500 MG tablet Take 1 tablet (500 mg total) by mouth 2 (two) times daily as needed. Patient taking differently: Take 500 mg by mouth 2 (two) times daily as needed for mild pain.  02/09/20 02/08/21  Al Decant, MD  pantoprazole (PROTONIX) 40 MG tablet Take 1 tablet (40 mg total) by mouth 2 (two) times daily. Patient taking differently: Take 40 mg by mouth in the morning, at noon, and at bedtime.  02/09/20   Al Decant, MD  sucralfate (CARAFATE) 1 GM/10ML suspension Take 10 mLs (1 g total) by mouth 4 (four) times daily -  with meals and at bedtime. Patient not taking: Reported on 03/12/2020 03/04/20   Lorayne Bender, PA-C    Allergies    Patient has no known allergies.  Review of Systems   Review of Systems  Respiratory: Positive for shortness of breath.   Cardiovascular: Positive for chest pain.  Skin: Positive for rash.  All other systems reviewed and are negative.   Physical Exam Updated Vital Signs BP 102/72   Pulse 83   Resp (!) 26   SpO2 100%   Physical Exam Vitals and nursing note reviewed.  Constitutional:      Appearance: He is well-developed.  HENT:     Head: Normocephalic and atraumatic.  Eyes:     Conjunctiva/sclera: Conjunctivae normal.  Cardiovascular:     Rate and Rhythm: Normal rate and regular rhythm.      Heart sounds: Normal heart sounds. No murmur.  Pulmonary:     Effort: Pulmonary effort is normal. No respiratory distress.     Breath sounds: Normal breath sounds.  Abdominal:     Palpations: Abdomen is soft.     Tenderness: There is no abdominal tenderness.  Musculoskeletal:        General: Normal range of motion.     Cervical back: Normal range of motion and neck supple.  Skin:    General: Skin is warm and dry.  Neurological:     Mental Status: He is alert.     ED Results / Procedures / Treatments   Labs (  all labs ordered are listed, but only abnormal results are displayed) Labs Reviewed  CBC - Abnormal; Notable for the following components:      Result Value   WBC 13.1 (*)    Hemoglobin 11.5 (*)    HCT 37.4 (*)    MCV 72.6 (*)    MCH 22.3 (*)    RDW 21.0 (*)    Platelets 475 (*)    nRBC 0.4 (*)    All other components within normal limits  BASIC METABOLIC PANEL  TROPONIN I (HIGH SENSITIVITY)  TROPONIN I (HIGH SENSITIVITY)    EKG EKG Interpretation  Date/Time:  Saturday March 23 2020 10:18:40 EDT Ventricular Rate:  95 PR Interval:  108 QRS Duration: 88 QT Interval:  348 QTC Calculation: 437 R Axis:   76 Text Interpretation: Sinus rhythm with short PR Cannot rule out Inferior infarct , age undetermined Abnormal ECG Confirmed by Isla Pence [phone]) on 03/23/2020 10:34:14 AM   Radiology DG Chest 2 View  Result Date: 03/23/2020 CLINICAL DATA:  Chest pain EXAM: CHEST - 2 VIEW COMPARISON:  03/10/2020 FINDINGS: The heart size and mediastinal contours are within normal limits. Low lung volumes. Patchy bilateral lung infiltrates persist, although have improved compared to previous x-ray. Small right pleural effusion. No pneumothorax. The visualized skeletal structures are unremarkable. IMPRESSION: Persistent but improved bilateral patchy lung infiltrates. Small right pleural effusion. Electronically Signed   By: Davina Poke D.O.   On: 03/23/2020 11:42     Procedures Procedures (including critical care time)  Medications Ordered in ED Medications  sodium chloride flush (NS) 0.9 % injection 3 mL (has no administration in time range)    ED Course  I have reviewed the triage vital signs and the nursing notes.  Pertinent labs & imaging results that were available during my care of the patient were reviewed by me and considered in my medical decision making (see chart for details).    MDM Rules/Calculators/A&P                      MDM: portable chest xray shows improving pneumonia,  ekg no acute abnormality,  Troponin is negative x 2.  Pt currently does not have hives, family member has a picture that shows Hives, Pt advised to stop cough medication.  Benadryl if any further rash.  Final Clinical Impression(s) / ED Diagnoses Final diagnoses:  Hives    Rx / DC Orders ED Discharge Orders    None       Sidney Ace 03/23/20 1347    Isla Pence, MD 03/23/20 1415

## 2020-03-23 NOTE — ED Triage Notes (Signed)
Daughter/translator stated, He has a rash on both legs, chest burning and SOB with cough. He has oxygen all the time for infection/pneumonia , not really sure.

## 2020-03-23 NOTE — ED Notes (Signed)
Pt d/c home per MD order. Discharge summary reviewed, pt daughter at bedside- verbalizes understanding. Off unit with NT, reports discharge ride home.

## 2020-03-23 NOTE — ED Notes (Signed)
After hours [place] interpretor paged, EDP aware. Awaiting return call

## 2020-03-23 NOTE — ED Notes (Signed)
Pt taken to xray 

## 2020-03-23 NOTE — Discharge Instructions (Addendum)
Continue current blood pressure medications.  Continue oxygen at home.

## 2020-03-23 NOTE — ED Notes (Signed)
This nurse paged [place] interpretor, awaiting call back.

## 2020-03-25 ENCOUNTER — Other Ambulatory Visit: Payer: Self-pay | Admitting: Internal Medicine

## 2020-03-25 ENCOUNTER — Ambulatory Visit (INDEPENDENT_AMBULATORY_CARE_PROVIDER_SITE_OTHER): Payer: Self-pay | Admitting: Internal Medicine

## 2020-03-25 ENCOUNTER — Encounter: Payer: Self-pay | Admitting: Internal Medicine

## 2020-03-25 VITALS — BP 113/80 | HR 103 | Temp 98.3°F | Ht 62.0 in | Wt 120.0 lb

## 2020-03-25 DIAGNOSIS — I10 Essential (primary) hypertension: Secondary | ICD-10-CM

## 2020-03-25 DIAGNOSIS — N179 Acute kidney failure, unspecified: Secondary | ICD-10-CM

## 2020-03-25 DIAGNOSIS — J1282 Pneumonia due to coronavirus disease 2019: Secondary | ICD-10-CM

## 2020-03-25 DIAGNOSIS — K219 Gastro-esophageal reflux disease without esophagitis: Secondary | ICD-10-CM

## 2020-03-25 DIAGNOSIS — U071 COVID-19: Secondary | ICD-10-CM

## 2020-03-25 DIAGNOSIS — R001 Bradycardia, unspecified: Secondary | ICD-10-CM

## 2020-03-25 LAB — CBC WITH DIFFERENTIAL/PLATELET
Abs Immature Granulocytes: 0.06 10*3/uL (ref 0.00–0.07)
Basophils Absolute: 0.1 10*3/uL (ref 0.0–0.1)
Basophils Relative: 1 %
Eosinophils Absolute: 2.5 10*3/uL — ABNORMAL HIGH (ref 0.0–0.5)
Eosinophils Relative: 23 %
HCT: 38.7 % — ABNORMAL LOW (ref 39.0–52.0)
Hemoglobin: 11.9 g/dL — ABNORMAL LOW (ref 13.0–17.0)
Immature Granulocytes: 1 %
Lymphocytes Relative: 19 %
Lymphs Abs: 2.1 10*3/uL (ref 0.7–4.0)
MCH: 22.6 pg — ABNORMAL LOW (ref 26.0–34.0)
MCHC: 30.7 g/dL (ref 30.0–36.0)
MCV: 73.4 fL — ABNORMAL LOW (ref 80.0–100.0)
Monocytes Absolute: 0.9 10*3/uL (ref 0.1–1.0)
Monocytes Relative: 8 %
Neutro Abs: 5.4 10*3/uL (ref 1.7–7.7)
Neutrophils Relative %: 48 %
Platelets: 505 10*3/uL — ABNORMAL HIGH (ref 150–400)
RBC: 5.27 MIL/uL (ref 4.22–5.81)
RDW: 20.4 % — ABNORMAL HIGH (ref 11.5–15.5)
WBC: 11 10*3/uL — ABNORMAL HIGH (ref 4.0–10.5)
nRBC: 0.2 % (ref 0.0–0.2)

## 2020-03-25 LAB — COMPREHENSIVE METABOLIC PANEL
ALT: 31 U/L (ref 0–44)
AST: 24 U/L (ref 15–41)
Albumin: 3.5 g/dL (ref 3.5–5.0)
Alkaline Phosphatase: 102 U/L (ref 38–126)
Anion gap: 10 (ref 5–15)
BUN: 6 mg/dL (ref 6–20)
CO2: 24 mmol/L (ref 22–32)
Calcium: 8.8 mg/dL — ABNORMAL LOW (ref 8.9–10.3)
Chloride: 103 mmol/L (ref 98–111)
Creatinine, Ser: 0.9 mg/dL (ref 0.61–1.24)
GFR calc Af Amer: >60 mL/min (ref >60)
GFR calc non Af Amer: >60 mL/min (ref >60)
Glucose, Bld: 100 mg/dL — ABNORMAL HIGH (ref 70–99)
Potassium: 4.3 mmol/L (ref 3.5–5.1)
Sodium: 137 mmol/L (ref 135–145)
Total Bilirubin: 0.8 mg/dL (ref 0.3–1.2)
Total Protein: 7.4 g/dL (ref 6.5–8.1)

## 2020-03-25 LAB — D-DIMER, QUANTITATIVE: D-Dimer, Quant: 2.86 ug/mL-FEU — ABNORMAL HIGH (ref 0.00–0.50)

## 2020-03-25 LAB — C-REACTIVE PROTEIN: CRP: <0.5 mg/dL (ref <1.0)

## 2020-03-25 LAB — FERRITIN: Ferritin: 476 ng/mL — ABNORMAL HIGH (ref 24–336)

## 2020-03-25 MED ORDER — METOPROLOL SUCCINATE ER 25 MG PO TB24: 25.0000 mg | ORAL_TABLET | Freq: Every day | ORAL | 3 refills | Status: DC

## 2020-03-25 MED ORDER — PANTOPRAZOLE SODIUM 40 MG PO TBEC: 40.0000 mg | DELAYED_RELEASE_TABLET | Freq: Every day | ORAL | 3 refills | Status: DC

## 2020-03-25 NOTE — Progress Notes (Signed)
Internal Medicine Clinic Attending  Case discussed with Dr. Alexander at the time of the visit.  We reviewed the resident's history and exam and pertinent patient test results.  I agree with the assessment, diagnosis, and plan of care documented in the resident's note.  

## 2020-03-25 NOTE — Assessment & Plan Note (Addendum)
Pt had COVID pneumonia in early March and received steroids and remdesivir. Shortly thereafter, the patient developed a superimposed bacterial pneumonia that required 5 days of broad spectrum antibiotics and was discharged 7 days prior to today's visit. The patient was on room air prior to his infections, but was discharged with 1-2L supplemental O2 PRN. He was ambulated here in clinic w/ and w/o O2 and was saturating at 93% on RA. However, the patient felt very short of breath and had to sit down to catch his breath. Pt still had crackles and course breath sounds on exam.  -HH PT ordered for deconditioning. Explained to pt that he should do exercises that he is taught daily to increase his strength. He is in agreement.  -Encouraged pt to be more active around the house. Wife was under the impression that he was still infectious and needed to quaratine. I advised her  -Referral to respiratory therapy provided -Trend inflammatory lab panel, including CRP, Ddimer and ferritin.

## 2020-03-25 NOTE — Patient Instructions (Signed)
Thank you for visiting clinic today. Below is a summary of what we discussed:  1. Deconditioning - Your hospitalizations for pneumonia has been difficulty on your body. I am putting in a referral to have the physical therapists come to your home.  2. Blood tests -Your blood test results look good so far. There are still a couple more that are pending. If there are any abnormal results, I will call you to let you know.   3. Follow up -Please follow up with me in clinic after you have been seen by the physical therapists  If you develop fevers, increased shortness of breath, coughing or confusion, this may be a sign that the infection may be coming back.

## 2020-03-25 NOTE — Assessment & Plan Note (Signed)
Pt has been taking protonix chronically -Refilled prescription

## 2020-03-25 NOTE — Assessment & Plan Note (Signed)
Refilled metoprolol 

## 2020-03-25 NOTE — Assessment & Plan Note (Signed)
Pt had an AKI at most recent hospitalization a week ago. Today, his kidney function is normal on BMP.

## 2020-03-25 NOTE — Progress Notes (Signed)
   CC: Hospital follow up  HPI:  [name] is a 59 y.o. male with a history as noted below who presents today for pneumonia hospitalization follow-up.  Please refer to the problem based charting for details.  Breathing - 93% no O2 HH PT Rash   Past Medical History:  Diagnosis Date  . Anemia   . CHF (congestive heart failure) ([place])   . Dental infection 08/02/2018  . GERD (gastroesophageal reflux disease)   . Hypertension   . Shingles rash 07/07/2019  . Thalassemia, alpha ([place])   . Viral gastritis 02/08/2019   Review of Systems: All systems were reviewed and are otherwise negative unless mentioned in the HPI.  Physical Exam:  Vitals:   03/25/20 1401  BP: 113/80  Pulse: (!) 103  Temp: 98.3 F (36.8 C)  TempSrc: Oral  SpO2: 99%  Weight: 120 lb (54.4 kg)  Height: 5\' 2"  (1.575 m)   Physical Exam Vitals reviewed.  Constitutional:      General: He is not in acute distress.    Appearance: Normal appearance. He is normal weight. He is not ill-appearing, toxic-appearing or diaphoretic.  HENT:     Head: Normocephalic and atraumatic.  Eyes:     Extraocular Movements: Extraocular movements intact.  Cardiovascular:     Rate and Rhythm: Normal rate and regular rhythm.     Pulses: Normal pulses.     Heart sounds: Normal heart sounds. No murmur. No friction rub. No gallop.   Pulmonary:     Effort: Tachypnea and accessory muscle usage present. No respiratory distress.     Breath sounds: Examination of the right-upper field reveals wheezing. Examination of the left-upper field reveals wheezing. Examination of the right-middle field reveals wheezing. Examination of the left-middle field reveals wheezing. Examination of the right-lower field reveals decreased breath sounds and wheezing. Examination of the left-lower field reveals decreased breath sounds and wheezing. Decreased breath sounds and wheezing present.     Comments: Diffuse coarse breath sounds Abdominal:     General:  Abdomen is flat. Bowel sounds are normal. There is no distension.     Palpations: Abdomen is soft.     Tenderness: There is no abdominal tenderness. There is no guarding.  Neurological:     Mental Status: He is alert.    Assessment & Plan:   See Encounters Tab for problem based charting.  Patient discussed with Dr. Lynnae January

## 2020-03-27 NOTE — Congregational Nurse Program (Signed)
CN office visit by patient's wife on his behalf.  He is home recovering from Covid-19 and too weak to come.  She asked for assistance with the medical bills they had received. CN called [facility] financial office and those bills are covered by his 100% discount.  We also called [facility] and emailed a copy of the discount letter.  Representative stated bill should be covered under [employer] Discount.  We also called [employer] to update recent medical information in reference to his disability claim. Wife states he is still very weak but feeling better today.  They have not yet heard from th PT referral made at Internal medicine appointment on Monday.  Jake Michaelis RN, Congregational Nurse [phone]

## 2020-03-27 NOTE — Telephone Encounter (Signed)
Pt kept 4/12 appt; seen by Dr Sheppard Coil.

## 2020-03-29 ENCOUNTER — Telehealth: Payer: Self-pay | Admitting: *Deleted

## 2020-03-29 NOTE — Telephone Encounter (Signed)
LVM FOR [name] TO RETURN CALL TO [facility]PC / Eric Ingram REGARDING REFERRALS. PATIENT HAS NO COVERAGE FOR HOME HEALTH  / WILL NEED TO CALL [facility] TO MAKE APPOINTMENT.

## 2020-03-29 NOTE — Addendum Note (Signed)
Addended by: Joni Reining C on: 03/29/2020 10:42 AM   Modules accepted: Orders

## 2020-03-29 NOTE — Addendum Note (Signed)
Addended by: Hulan Fray on: 03/29/2020 02:11 PM   Modules accepted: Orders

## 2020-04-02 ENCOUNTER — Telehealth: Payer: Self-pay | Admitting: *Deleted

## 2020-04-02 ENCOUNTER — Telehealth: Payer: Self-pay

## 2020-04-02 NOTE — Telephone Encounter (Signed)
Spoke with Ms. Hoyle Sauer regarding referral for patient with [facility]. She is to call office to make appointment for this patient.  Also that [place] is not covered by the orange card nor covered by [employer].  Ms. Hoyle Sauer will call back with the patient appointment.

## 2020-04-02 NOTE — Telephone Encounter (Signed)
Phone call to [facility] to schedule appointment following call from [place] at [facility] regarding referral. Made appointment with Dr. Valeta Harms on 04/22/2020 @ 10:00 am.  Will notify patient with help of interpreter Diu Hartshorn.  Jake Michaelis RN Congregational Nurse [phone]

## 2020-04-03 ENCOUNTER — Telehealth: Payer: Self-pay

## 2020-04-03 NOTE — Telephone Encounter (Signed)
Phone call to patient with interpreter Eric Ingram assisting.  We told patient that he has an appointment at [facility] with Dr. Valeta Harms on Monday May 10,2021 at 10:00 am and that he needs to take his [employer] discount letter with him.  Also spoke with daughter Eric Ingram who wrote information down for him.  States he is still very weak and has difficulty walking.  His daughter says she thinks he is getting better.  Eric Michaelis RN, Congregational Nurse [phone]

## 2020-04-17 NOTE — Congregational Nurse Program (Signed)
Patient and daughter came to CN office about his medical bills.  He stayed in the car due to weakness and his daughter came in. We called [facility] billing and [facility] and provided information regarding his [facility] discount.  [place].  He was alert with an occasional dry cough.  He had 02 with him but was not using it presently.  BP was 120/80 and Sp02 was 96%.  Reminded him of his appointment with Dr. Valeta Harms at [facility] on Monday 04/22/2020.  His daughter plans to take him.  Jake Michaelis RN, Congregational Nurse [phone]

## 2020-04-22 ENCOUNTER — Encounter: Payer: Self-pay | Admitting: *Deleted

## 2020-04-22 ENCOUNTER — Ambulatory Visit (INDEPENDENT_AMBULATORY_CARE_PROVIDER_SITE_OTHER): Payer: Self-pay | Admitting: Pulmonary Disease

## 2020-04-22 ENCOUNTER — Other Ambulatory Visit: Payer: Self-pay

## 2020-04-22 ENCOUNTER — Encounter: Payer: Self-pay | Admitting: Pulmonary Disease

## 2020-04-22 VITALS — BP 108/68 | HR 88 | Temp 97.6°F | Ht 62.0 in | Wt 129.4 lb

## 2020-04-22 DIAGNOSIS — R0600 Dyspnea, unspecified: Secondary | ICD-10-CM

## 2020-04-22 DIAGNOSIS — Z8616 Personal history of COVID-19: Secondary | ICD-10-CM

## 2020-04-22 DIAGNOSIS — R0602 Shortness of breath: Secondary | ICD-10-CM

## 2020-04-22 DIAGNOSIS — J849 Interstitial pulmonary disease, unspecified: Secondary | ICD-10-CM

## 2020-04-22 DIAGNOSIS — R0609 Other forms of dyspnea: Secondary | ICD-10-CM

## 2020-04-22 NOTE — Progress Notes (Signed)
Synopsis: Referred in May 2021 for history of COVID-19 by Oda Kilts, MD  Subjective:   PATIENT ID: Eric Ingram GENDER: male DOB: 1961-04-12, MRN: [id number]  Chief Complaint  Patient presents with  . Consult    Referred by Dr. Sheppard Coil due to history pneumonia from covid.  Pt does have occ complaints of chest congestion, cough, and SOB with exertion.    This is a 59 year old gentleman past medical history of congestive heart failure, GERD, hypertension.  Patient was diagnosed with COVID-01 March 2020.  He was admitted to [address]  Discharge from the hospital on 02/26/2020 discharge summary reviewed.  For his COVID-19 patient was treated with high flow nasal cannula dexamethasone and remdesivir.  Subsequently he has had multiple emergency department admissions he was readmitted to the hospital at the end of March and discharged March 18, 2020.  At this time still having recurrent respiratory complaints felt to be related to COVID-19.  Once again 5 days later seen again in the emergency department.  03/25/2020 seen by Dr. Sheppard Coil and his primary care office visit.  Last available chest x-ray and April 2021 revealed persistent bilateral patchy infiltrates.  OV 04/22/2020: Patient seen today in the office.  Still complains of significant dyspnea on exertion and shortness of breath.  He feels like he has been very fatigued and losing weight.  Unfortunately has not been very active during this time.  We reviewed his chest imaging today in the office with the patient.  Overall he feels like his trajectory is that he has been getting better but it has been a very slow recovery for him.  He does have some sputum production which is clear.  Patient denies hemoptysis.   Past Medical History:  Diagnosis Date  . Anemia   . CHF (congestive heart failure) ([place])   . Dental infection 08/02/2018  . GERD (gastroesophageal reflux disease)   . Hypertension   . Shingles rash 07/07/2019  . Thalassemia, alpha  ([place])   . Viral gastritis 02/08/2019     Family History  Problem Relation Age of Onset  . Colon cancer Neg Hx   . Stomach cancer Neg Hx      Past Surgical History:  Procedure Laterality Date  . NO PAST SURGERIES      Social History   Socioeconomic History  . Marital status: Married    Spouse name: Not on file  . Number of children: 4  . Years of education: Not on file  . Highest education level: Not on file  Occupational History  . Occupation: Unemployed  Tobacco Use  . Smoking status: Former Smoker    Quit date: 12/15/1999    Years since quitting: 20.3  . Smokeless tobacco: Never Used  Substance and Sexual Activity  . Alcohol use: No    Comment: former  . Drug use: No  . Sexual activity: Not on file  Other Topics Concern  . Not on file  Social History Narrative  . Not on file   Social Determinants of Health   Financial Resource Strain:   . Difficulty of Paying Living Expenses:   Food Insecurity:   . Worried About Charity fundraiser in the Last Year:   . Arboriculturist in the Last Year:   Transportation Needs:   . Film/video editor (Medical):   Marland Kitchen Lack of Transportation (Non-Medical):   Physical Activity:   . Days of Exercise per Week:   . Minutes of Exercise per Session:  Stress:   . Feeling of Stress :   Social Connections:   . Frequency of Communication with Friends and Family:   . Frequency of Social Gatherings with Friends and Family:   . Attends Religious Services:   . Active Member of Clubs or Organizations:   . Attends Archivist Meetings:   Marland Kitchen Marital Status:   Intimate Partner Violence:   . Fear of Current or Ex-Partner:   . Emotionally Abused:   Marland Kitchen Physically Abused:   . Sexually Abused:      No Known Allergies   Outpatient Medications Prior to Visit  Medication Sig Dispense Refill  . albuterol (VENTOLIN HFA) 108 (90 Base) MCG/ACT inhaler Inhale 1-2 puffs into the lungs every 6 (six) hours as needed for wheezing or  shortness of breath. 18 g 0  . amLODipine (NORVASC) 2.5 MG tablet Take 1 tablet (2.5 mg total) by mouth daily. 30 tablet 0  . fluticasone (FLONASE) 50 MCG/ACT nasal spray Place 2 sprays into both nostrils daily.    Marland Kitchen gabapentin (NEURONTIN) 300 MG capsule TAKE 2 CAPSULES BY MOUTH THREE TIMES DAILY. TAKE UP TO 2 CAPSULES IN THE MORNING, AFTERNOON, AND AT BEDTIME (Patient taking differently: Take 300-600 mg by mouth See admin instructions. Take 300 mg by mouth in the morning, 600 mg at noon, and 300 mg at bedtime) 180 capsule 2  . hydrocortisone cream 1 % Apply topically 2 (two) times daily. 30 g 0  . lidocaine (LIDODERM) 5 % Place 1 patch onto the skin daily. Remove & Discard patch within 12 hours or as directed by MD 30 patch 0  . lisinopril (ZESTRIL) 40 MG tablet Take 1 tablet (40 mg total) by mouth daily. 90 tablet 3  . metoprolol succinate (TOPROL-XL) 25 MG 24 hr tablet Take 1 tablet (25 mg total) by mouth at bedtime. IM Program 30 tablet 3  . naproxen (NAPROSYN) 500 MG tablet Take 1 tablet (500 mg total) by mouth 2 (two) times daily as needed. (Patient taking differently: Take 500 mg by mouth 2 (two) times daily as needed for mild pain. ) 60 tablet 2  . pantoprazole (PROTONIX) 40 MG tablet Take 1 tablet (40 mg total) by mouth daily. 30 tablet 3  . sucralfate (CARAFATE) 1 GM/10ML suspension Take 10 mLs (1 g total) by mouth 4 (four) times daily -  with meals and at bedtime. 420 mL 0  . famotidine (PEPCID) 20 MG tablet Take 1 tablet (20 mg total) by mouth 2 (two) times daily for 5 days. (Patient not taking: Reported on 03/12/2020) 10 tablet 0  . fluticasone (FLONASE) 50 MCG/ACT nasal spray Place 2 sprays into both nostrils daily. 16 g 0  . benzonatate (TESSALON) 100 MG capsule Take 1 capsule (100 mg total) by mouth 3 (three) times daily. 20 capsule 0  . guaiFENesin-codeine 100-10 MG/5ML syrup Take 5-10 mLs by mouth every 6 (six) hours. 180 mL 0   No facility-administered medications prior to visit.     Review of Systems  Constitutional: Negative for chills, fever, malaise/fatigue and weight loss.  HENT: Negative for hearing loss, sore throat and tinnitus.   Eyes: Negative for blurred vision and double vision.  Respiratory: Positive for cough and shortness of breath. Negative for hemoptysis, sputum production, wheezing and stridor.   Cardiovascular: Negative for chest pain, palpitations, orthopnea, leg swelling and PND.  Gastrointestinal: Negative for abdominal pain, constipation, diarrhea, heartburn, nausea and vomiting.  Genitourinary: Negative for dysuria, hematuria and urgency.  Musculoskeletal: Negative for  joint pain and myalgias.  Skin: Negative for itching and rash.  Neurological: Negative for dizziness, tingling, weakness and headaches.  Endo/Heme/Allergies: Negative for environmental allergies. Does not bruise/bleed easily.  Psychiatric/Behavioral: Negative for depression. The patient is not nervous/anxious and does not have insomnia.   All other systems reviewed and are negative.    Objective:  Physical Exam Vitals reviewed.  Constitutional:      General: He is not in acute distress.    Appearance: He is well-developed.  HENT:     Head: Normocephalic and atraumatic.  Eyes:     General: No scleral icterus.    Conjunctiva/sclera: Conjunctivae normal.     Pupils: Pupils are equal, round, and reactive to light.  Neck:     Vascular: No JVD.     Trachea: No tracheal deviation.  Cardiovascular:     Rate and Rhythm: Normal rate and regular rhythm.     Heart sounds: Normal heart sounds. No murmur.  Pulmonary:     Effort: Pulmonary effort is normal. No tachypnea, accessory muscle usage or respiratory distress.     Breath sounds: No stridor. No wheezing, rhonchi or rales.     Comments: Bilateral insp crackles  Abdominal:     General: Bowel sounds are normal. There is no distension.     Palpations: Abdomen is soft.     Tenderness: There is no abdominal tenderness.   Musculoskeletal:        General: No tenderness.     Cervical back: Neck supple.  Lymphadenopathy:     Cervical: No cervical adenopathy.  Skin:    General: Skin is warm and dry.     Capillary Refill: Capillary refill takes less than 2 seconds.     Findings: No rash.  Neurological:     Mental Status: He is alert and oriented to person, place, and time.  Psychiatric:        Behavior: Behavior normal.      Vitals:   04/22/20 1018  BP: 108/68  Pulse: 88  Temp: 97.6 F (36.4 C)  TempSrc: Temporal  SpO2: 95%  Weight: 129 lb 6.4 oz (58.7 kg)  Height: 5\' 2"  (1.575 m)   95% on RA BMI Readings from Last 3 Encounters:  04/22/20 23.67 kg/m  03/25/20 21.95 kg/m  03/10/20 30.18 kg/m   Wt Readings from Last 3 Encounters:  04/22/20 129 lb 6.4 oz (58.7 kg)  03/25/20 120 lb (54.4 kg)  03/10/20 165 lb (74.8 kg)     CBC    Component Value Date/Time   WBC 11.0 (H) 03/25/2020 1345   RBC 5.27 03/25/2020 1345   HGB 11.9 (L) 03/25/2020 1345   HGB 12.2 (L) 09/02/2018 1538   HCT 38.7 (L) 03/25/2020 1345   HCT 39.2 09/02/2018 1538   PLT 505 (H) 03/25/2020 1345   PLT 386 09/02/2018 1538   MCV 73.4 (L) 03/25/2020 1345   MCV 71 (L) 09/02/2018 1538   MCH 22.6 (L) 03/25/2020 1345   MCHC 30.7 03/25/2020 1345   RDW 20.4 (H) 03/25/2020 1345   RDW 16.3 (H) 09/02/2018 1538   LYMPHSABS 2.1 03/25/2020 1345   MONOABS 0.9 03/25/2020 1345   EOSABS 2.5 (H) 03/25/2020 1345   BASOSABS 0.1 03/25/2020 1345    Chest Imaging: April 2021 chest x-ray: Persistent bilateral patchy infiltrates  Pulmonary Functions Testing Results: No flowsheet data found.  FeNO: none   Pathology: none  Echocardiogram: 10/2019 results reviewed  1. Left ventricular ejection fraction, by visual estimation, is 60 to  65%. The left ventricle has normal function. There is no left ventricular  hypertrophy.  2. Normal GLS -20.  3. Global right ventricle has normal systolic function.The right  ventricular  size is normal. No increase in right ventricular wall  thickness.  4. Left atrial size was normal.  5. Right atrial size was normal.  6. The mitral valve is normal in structure. Trace mitral valve  regurgitation. No evidence of mitral stenosis.  7. The tricuspid valve is normal in structure. Tricuspid valve  regurgitation is trivial.  8. The aortic valve is tricuspid. Aortic valve regurgitation is not  visualized. Mild aortic valve sclerosis without stenosis.  9. The pulmonic valve was normal in structure. Pulmonic valve  regurgitation is trivial.  10. The inferior vena cava is normal in size with greater than 50%  respiratory variability, suggesting right atrial pressure of 3 mmHg.   Heart Catheterization: None     Assessment & Plan:     ICD-10-CM   1. SOB (shortness of breath)  R06.02 CT CHEST HIGH RESOLUTION    Pulmonary Function Test  2. DOE (dyspnea on exertion)  R06.00 CT CHEST HIGH RESOLUTION  3. History of COVID-19  Z86.16 CT CHEST HIGH RESOLUTION    Pulmonary Function Test  4. Interstitial pulmonary disease (HCC)  J84.9 CT CHEST HIGH RESOLUTION    Pulmonary Function Test    Discussion:  This is a 59 year old gentleman history of COVID-19 in March.  Has significant parenchymal abnormality still present on CT imaging as well as chest x-ray imaging.  We reviewed these today in the office.  Reviewed his recent hospitalization, discharge summary results as well as his multiple ED visits that he has had since this time.  Plan: I think the next best step would be obtaining PFTs and CT imaging. I think that we should wait at least 6 months from his initial COVID-19 diagnosis to see if he is continue to have recovery. HRCT imaging of the chest will help look for any ongoing fibrosis or scarring. Most of these patients do start to turn around with her symptomatology at approximately 6 months. We will schedule a follow-up in the office in 6 months after PFTs and CT imaging  complete.  Greater than 50% of this patient's 45-minute office visit was spent face-to-face discussing the above recommendations and treatment plan.   Current Outpatient Medications:  .  albuterol (VENTOLIN HFA) 108 (90 Base) MCG/ACT inhaler, Inhale 1-2 puffs into the lungs every 6 (six) hours as needed for wheezing or shortness of breath., Disp: 18 g, Rfl: 0 .  amLODipine (NORVASC) 2.5 MG tablet, Take 1 tablet (2.5 mg total) by mouth daily., Disp: 30 tablet, Rfl: 0 .  fluticasone (FLONASE) 50 MCG/ACT nasal spray, Place 2 sprays into both nostrils daily., Disp: , Rfl:  .  gabapentin (NEURONTIN) 300 MG capsule, TAKE 2 CAPSULES BY MOUTH THREE TIMES DAILY. TAKE UP TO 2 CAPSULES IN THE MORNING, AFTERNOON, AND AT BEDTIME (Patient taking differently: Take 300-600 mg by mouth See admin instructions. Take 300 mg by mouth in the morning, 600 mg at noon, and 300 mg at bedtime), Disp: 180 capsule, Rfl: 2 .  hydrocortisone cream 1 %, Apply topically 2 (two) times daily., Disp: 30 g, Rfl: 0 .  lidocaine (LIDODERM) 5 %, Place 1 patch onto the skin daily. Remove & Discard patch within 12 hours or as directed by MD, Disp: 30 patch, Rfl: 0 .  lisinopril (ZESTRIL) 40 MG tablet, Take 1 tablet (40 mg total)  by mouth daily., Disp: 90 tablet, Rfl: 3 .  metoprolol succinate (TOPROL-XL) 25 MG 24 hr tablet, Take 1 tablet (25 mg total) by mouth at bedtime. IM Program, Disp: 30 tablet, Rfl: 3 .  naproxen (NAPROSYN) 500 MG tablet, Take 1 tablet (500 mg total) by mouth 2 (two) times daily as needed. (Patient taking differently: Take 500 mg by mouth 2 (two) times daily as needed for mild pain. ), Disp: 60 tablet, Rfl: 2 .  pantoprazole (PROTONIX) 40 MG tablet, Take 1 tablet (40 mg total) by mouth daily., Disp: 30 tablet, Rfl: 3 .  sucralfate (CARAFATE) 1 GM/10ML suspension, Take 10 mLs (1 g total) by mouth 4 (four) times daily -  with meals and at bedtime., Disp: 420 mL, Rfl: 0 .  famotidine (PEPCID) 20 MG tablet, Take 1 tablet  (20 mg total) by mouth 2 (two) times daily for 5 days. (Patient not taking: Reported on 03/12/2020), Disp: 10 tablet, Rfl: 0 .  fluticasone (FLONASE) 50 MCG/ACT nasal spray, Place 2 sprays into both nostrils daily., Disp: 16 g, Rfl: 0   Garner Nash, DO [facility] 04/22/2020 10:38 AM

## 2020-04-22 NOTE — Patient Instructions (Addendum)
Thank you for visiting Dr. Valeta Harms at [facility]. Today we recommend the following:  Orders Placed This Encounter  Procedures  . CT CHEST HIGH RESOLUTION  . Pulmonary Function Test   Return in about 4 months (around 08/23/2020) for with APP or Dr. Valeta Harms.    Please do your part to reduce the spread of COVID-19.

## 2020-05-01 NOTE — Congregational Nurse Program (Signed)
CN office visit with interpreter Diu Hartshorn assisting. Patient asked for help with medical bills. CN called [facility] and provided [employer]  information (100% discount).  We reviewed instructions for June 30,2021 Pulmonary Function test and the Covid screening for June 08, 2020.  He stated that when he picked up his medicine last week he did not receive his Lisinopril. CN called [facility] who said that it was a couple days too soon last week but that he can pick it up today.  BP 116/88.  States he is gradually feeling better from Covid but still has some cough and SOB.  He is also still weak but is now able to drive.  Jake Michaelis RN, Congregational Nurse [phone]

## 2020-05-23 ENCOUNTER — Other Ambulatory Visit: Payer: Self-pay | Admitting: Student in an Organized Health Care Education/Training Program

## 2020-05-23 DIAGNOSIS — M5412 Radiculopathy, cervical region: Secondary | ICD-10-CM

## 2020-05-23 DIAGNOSIS — B0229 Other postherpetic nervous system involvement: Secondary | ICD-10-CM

## 2020-05-29 NOTE — Congregational Nurse Program (Signed)
CN office visit with interpreter Diu Hartshorn assisting. Patient requesting help to apply for food stamps and with medical bills.  He currently has no income and has outstanding medical bills from Nov. and Dec. 2020. If no word soon about disability he will ask daughter to help him with bills.  Jake Michaelis RN, Congregational Nurse [phone].

## 2020-06-08 ENCOUNTER — Other Ambulatory Visit (HOSPITAL_COMMUNITY)
Admission: RE | Admit: 2020-06-08 | Discharge: 2020-06-08 | Disposition: A | Discharge status: Home / Self Care | Payer: HRSA Program | Source: Ambulatory Visit | Attending: Pulmonary Disease | Admitting: Pulmonary Disease

## 2020-06-08 DIAGNOSIS — Z20822 Contact with and (suspected) exposure to covid-19: Secondary | ICD-10-CM | POA: Insufficient documentation

## 2020-06-08 DIAGNOSIS — Z01812 Encounter for preprocedural laboratory examination: Secondary | ICD-10-CM | POA: Insufficient documentation

## 2020-06-08 LAB — SARS CORONAVIRUS 2 (TAT 6-24 HRS): SARS Coronavirus 2: NEGATIVE

## 2020-06-12 ENCOUNTER — Other Ambulatory Visit: Payer: Self-pay

## 2020-06-12 ENCOUNTER — Ambulatory Visit (INDEPENDENT_AMBULATORY_CARE_PROVIDER_SITE_OTHER): Payer: Self-pay | Admitting: Pulmonary Disease

## 2020-06-12 DIAGNOSIS — R0602 Shortness of breath: Secondary | ICD-10-CM

## 2020-06-12 DIAGNOSIS — J849 Interstitial pulmonary disease, unspecified: Secondary | ICD-10-CM

## 2020-06-12 DIAGNOSIS — Z8616 Personal history of COVID-19: Secondary | ICD-10-CM

## 2020-06-12 LAB — PULMONARY FUNCTION TEST
DL/VA % pred: 96 %
DL/VA: 4.27 ml/min/mmHg/L
DLCO cor % pred: 59 %
DLCO cor: 12.68 ml/min/mmHg
DLCO unc % pred: 59 %
DLCO unc: 12.68 ml/min/mmHg
FEF 25-75 Post: 2.02 L/sec
FEF 25-75 Pre: 0.43 L/sec
FEF2575-%Change-Post: 374 %
FEF2575-%Pred-Post: 87 %
FEF2575-%Pred-Pre: 18 %
FEV1-%Change-Post: 81 %
FEV1-%Pred-Post: 62 %
FEV1-%Pred-Pre: 34 %
FEV1-Post: 1.68 L
FEV1-Pre: 0.92 L
FEV1FVC-%Change-Post: 64 %
FEV1FVC-%Pred-Pre: 67 %
FEV6-%Change-Post: 10 %
FEV6-%Pred-Post: 58 %
FEV6-%Pred-Pre: 53 %
FEV6-Post: 1.96 L
FEV6-Pre: 1.78 L
FEV6FVC-%Change-Post: 0 %
FEV6FVC-%Pred-Post: 105 %
FEV6FVC-%Pred-Pre: 104 %
FVC-%Change-Post: 10 %
FVC-%Pred-Post: 56 %
FVC-%Pred-Pre: 50 %
FVC-Post: 1.98 L
FVC-Pre: 1.8 L
Post FEV1/FVC ratio: 85 %
Post FEV6/FVC ratio: 100 %
Pre FEV1/FVC ratio: 51 %
Pre FEV6/FVC Ratio: 99 %
RV % pred: 63 %
RV: 1.13 L
TLC % pred: 64 %
TLC: 3.47 L

## 2020-06-12 NOTE — Progress Notes (Signed)
Full PFT performed today. °

## 2020-06-19 ENCOUNTER — Telehealth: Payer: Self-pay | Admitting: *Deleted

## 2020-06-19 ENCOUNTER — Telehealth: Payer: Self-pay | Admitting: Pulmonary Disease

## 2020-06-19 NOTE — Telephone Encounter (Signed)
Patient in office with interpreter, SOB has actually improved, cough is better, patient reports some improvement and feels safe waiting to see Dr. Valeta Harms in December. Interpreter provided better information. Patient does not present today with any acute symptoms.

## 2020-06-19 NOTE — Telephone Encounter (Signed)
Call from Jake Michaelis, congregational nurse - stated pt continues to c/o SOB; he does not have an appt here until August. Advised her to call his pulmonologist with [facility]; stated she will.

## 2020-06-19 NOTE — Telephone Encounter (Signed)
Patient seen by congregational RN who sees the [place] population here in [place]. She reports that  Eric Ingram is having SOB that is not improving. Seen by Dr. Valeta Harms on 06/12/2020 with PFT, history of covid pneumonia. Respirations are increased and labored, no cough, no wheezing. He is returning to her office in one hour and she will call me directly, with interpreter, to gather more information to be passed on the provider of the day. Waiting for her call.

## 2020-07-03 NOTE — Congregational Nurse Program (Signed)
CN office visit with interpreter Diu Hartshorn assisting.  Patient brought letter from [place] stating  he was approved for food stamps but stated he has not received EBT card.  Phone call to caseworker and she stated he should receive it in a few days.  We also started a Medicaid application and will complete next week when he returns with the necessary documents.  States he continues to feel better following Covid -19.  Pulse ox was 99% today.  Jake Michaelis RN, Congregational Nurse [phone]

## 2020-07-03 NOTE — Congregational Nurse Program (Signed)
CN office visit with interpreter Diu Hartshorn assisting.  Patient brought letter from [place] regarding food stamps.  He was approved but states he has not received an EBT card.  Phone call to worker listed on letter who stated that he should receiue

## 2020-08-05 ENCOUNTER — Ambulatory Visit: Payer: Self-pay

## 2020-08-05 ENCOUNTER — Other Ambulatory Visit: Payer: Self-pay

## 2020-08-22 ENCOUNTER — Ambulatory Visit (INDEPENDENT_AMBULATORY_CARE_PROVIDER_SITE_OTHER): Payer: Self-pay | Admitting: Student

## 2020-08-22 ENCOUNTER — Other Ambulatory Visit: Payer: Self-pay

## 2020-08-22 ENCOUNTER — Encounter: Payer: Self-pay | Admitting: Student

## 2020-08-22 ENCOUNTER — Other Ambulatory Visit: Payer: Self-pay | Admitting: Student

## 2020-08-22 VITALS — BP 142/90 | HR 76 | Temp 98.4°F | Ht 62.0 in | Wt 142.7 lb

## 2020-08-22 DIAGNOSIS — M5412 Radiculopathy, cervical region: Secondary | ICD-10-CM

## 2020-08-22 DIAGNOSIS — U071 COVID-19: Secondary | ICD-10-CM

## 2020-08-22 DIAGNOSIS — B0229 Other postherpetic nervous system involvement: Secondary | ICD-10-CM

## 2020-08-22 DIAGNOSIS — Z23 Encounter for immunization: Secondary | ICD-10-CM

## 2020-08-22 DIAGNOSIS — Z0000 Encounter for general adult medical examination without abnormal findings: Secondary | ICD-10-CM

## 2020-08-22 DIAGNOSIS — I10 Essential (primary) hypertension: Secondary | ICD-10-CM

## 2020-08-22 DIAGNOSIS — J1282 Pneumonia due to coronavirus disease 2019: Secondary | ICD-10-CM

## 2020-08-22 DIAGNOSIS — E782 Mixed hyperlipidemia: Secondary | ICD-10-CM

## 2020-08-22 DIAGNOSIS — K219 Gastro-esophageal reflux disease without esophagitis: Secondary | ICD-10-CM

## 2020-08-22 LAB — POCT GLYCOSYLATED HEMOGLOBIN (HGB A1C): Hemoglobin A1C: 4.2 % (ref 4.0–5.6)

## 2020-08-22 LAB — GLUCOSE, CAPILLARY: Glucose-Capillary: 99 mg/dL (ref 70–99)

## 2020-08-22 MED ORDER — LISINOPRIL 40 MG PO TABS: 40.0000 mg | ORAL_TABLET | Freq: Every day | ORAL | 3 refills | Status: DC

## 2020-08-22 MED ORDER — SUCRALFATE 1 GM/10ML PO SUSP: 1.0000 g | Freq: Three times a day (TID) | ORAL | 2 refills | Status: DC

## 2020-08-22 MED ORDER — GABAPENTIN 300 MG PO CAPS: ORAL_CAPSULE | ORAL | 2 refills | Status: DC

## 2020-08-22 MED ORDER — CYCLOBENZAPRINE HCL 5 MG PO TABS: 5.0000 mg | ORAL_TABLET | Freq: Three times a day (TID) | ORAL | 0 refills | Status: DC | PRN

## 2020-08-22 NOTE — Assessment & Plan Note (Signed)
Blood pressure today 153/97, recheck 142/90.  Patient states that he has not been taking the lisinopril for about 1 month and a half.  Will refill lisinopril.   Plan: -Refill lisinopril 40 mg daily -Continue amlodipine 2.5 mg daily -Continue metoprolol succinate 25 mg daily

## 2020-08-22 NOTE — Assessment & Plan Note (Addendum)
Patient states that pain started approximately 4 years ago.  Denies trauma.  Pain originated from left shoulder blade and radiates to left upper cervical and also left forearm.  Pain is constant but worse in the afternoon.  Tylenol and gabapentin provide minimal relief.  He complains of weakness of left arm which is progressively worse.  He also complains of numbness in the ulnar distribution of forearm.  X-ray of cervical done in 2020 showed moderate multilevel degenerative disc disease with bilateral neural foraminal stenosis secondary to uncovertebral spurring.  No MRI was done.  Patient will have difficulty of obtaining cervical MRI given he is self-pay.  Pending paperwork for charity.  In the meantime continue gabapentin and Tylenol for pain relief.  Added Flexeril for muscle relaxant.  Plan: -Continue gabapentin, patient can take up to 3 pills before bedtime -Continue Tylenol as needed -Start Flexeril 5 mg 3 times daily as needed -Pending paperwork for cervical MRI.  Patient may need referral to neurosurgeon depends on MRI results

## 2020-08-22 NOTE — Assessment & Plan Note (Signed)
Patient complains of heartburn after meals.  Upper EGD 02/2018 erosive gastritis and negative H. pylori.  Patient is taking Protonix 40 mg twice daily but not taking sucralfate.  I will refill sucralfate today.  Patient can also take Tums or Alka-Seltzer as needed.  Advised patient to avoid spicy or heavily seasoned food.  Plan: -Refill sucralfate -Continue Protonix 40 mg twice daily -Tums or Alka-Seltzer as needed -Avoid spicy or heavily seasoned food

## 2020-08-22 NOTE — Assessment & Plan Note (Signed)
Flu shot today Lipid panel Hemoglobin A1c

## 2020-08-22 NOTE — Progress Notes (Signed)
   CC: Routine visit  HPI:  Eric Ingram is a 59 y.o. with past medical history of hypertension, adhesive capsulitis and cervical radiculopathy, who is here for routine visit.  Please see problem based charting for further details.  Past Medical History:  Diagnosis Date  . Anemia   . CHF (congestive heart failure) ([place])   . Dental infection 08/02/2018  . GERD (gastroesophageal reflux disease)   . Hypertension   . Shingles rash 07/07/2019  . Thalassemia, alpha ([place])   . Viral gastritis 02/08/2019   Review of Systems: As per HPI  Physical Exam:  Vitals:   08/22/20 1001 08/22/20 1030  BP: (!) 153/97 (!) 142/90  Pulse: 86 76  Temp: 98.4 F (36.9 C)   TempSrc: Oral   SpO2: 100%   Weight: 142 lb 11.2 oz (64.7 kg)   Height: 5\' 2"  (1.575 m)    Physical Exam Constitutional:      General: He is not in acute distress. HENT:     Head: Normocephalic.  Eyes:     General: No scleral icterus.       Right eye: No discharge.        Left eye: No discharge.  Neck:     Comments: Negative Spurling test bilaterally Cardiovascular:     Rate and Rhythm: Normal rate and regular rhythm.     Heart sounds: No murmur heard.   Pulmonary:     Effort: Pulmonary effort is normal. No respiratory distress.     Breath sounds: Normal breath sounds.  Abdominal:     Palpations: Abdomen is soft.  Musculoskeletal:     Cervical back: Normal range of motion. No rigidity.     Right lower leg: No edema.     Left lower leg: No edema.     Comments: 5/5 strength Right UE 4/5 Strength Left UE Decreased sensation ulnar side of forearm  Lymphadenopathy:     Cervical: Cervical adenopathy present.  Skin:    General: Skin is warm.  Neurological:     Mental Status: He is alert.  Psychiatric:        Mood and Affect: Mood normal.     Assessment & Plan:   See Encounters Tab for problem based charting.  Patient seen with Dr. Dareen Piano

## 2020-08-22 NOTE — Assessment & Plan Note (Signed)
Patient was diagnosed with Covid 19 pneumonia in March 2021.  He still is complaining of residual fatigue and shortness of breath.  SPO2 100% on room air.  I advised patient that it can take some time for him to fully recover.  If not better, will consider repeat chest x-ray on next visit.

## 2020-08-22 NOTE — Patient Instructions (Addendum)
[name],   It was a pleasure getting to know you today.  Here is a summary of what we talked about today:  1.  Shoulder and neck pain: This seems to be a nerve impingement issue.  We will try to get the paperwork for the neck MRI and we will let you know when it is ready.  In the meantime, continue taking the GABAPENTIN for the pain.  You can take up to 2 pills in the morning and afternoon, and up to 3 pills at bedtime.  I also add a muscle relaxant called FLEXERIL, take it 3 times a day as needed for pain.  Continue taking Tylenol for pain.  Please AVOID NSAIDs such as ibuprofen or naproxen due to gastritis.  2.  High blood pressure: I will refill LISINOPRIL 40 mg.  Please continue taking AMLODIPINE and METOPROLOL.   3.  Heartburn: I will refill SULCRAFATE.  Continue taking Protonix 40 mg twice daily.  You can also take Tums or Alka-Seltzer as needed for heartburn.  Please avoid spicy or heavily seasoned food.  Please contact us for any questions or concerns,  Take care,  Dr. Alfonse Spruce

## 2020-08-22 NOTE — Assessment & Plan Note (Signed)
Continue gabapentin.

## 2020-08-23 ENCOUNTER — Ambulatory Visit (INDEPENDENT_AMBULATORY_CARE_PROVIDER_SITE_OTHER)
Admission: RE | Admit: 2020-08-23 | Discharge: 2020-08-23 | Disposition: A | Discharge status: Home / Self Care | Payer: Self-pay | Source: Ambulatory Visit | Attending: Pulmonary Disease | Admitting: Pulmonary Disease

## 2020-08-23 ENCOUNTER — Other Ambulatory Visit: Payer: Self-pay | Admitting: Student

## 2020-08-23 ENCOUNTER — Other Ambulatory Visit: Payer: Self-pay

## 2020-08-23 DIAGNOSIS — E785 Hyperlipidemia, unspecified: Secondary | ICD-10-CM | POA: Insufficient documentation

## 2020-08-23 DIAGNOSIS — R0609 Other forms of dyspnea: Secondary | ICD-10-CM

## 2020-08-23 DIAGNOSIS — Z8616 Personal history of COVID-19: Secondary | ICD-10-CM

## 2020-08-23 DIAGNOSIS — E782 Mixed hyperlipidemia: Secondary | ICD-10-CM

## 2020-08-23 DIAGNOSIS — J849 Interstitial pulmonary disease, unspecified: Secondary | ICD-10-CM

## 2020-08-23 DIAGNOSIS — R0602 Shortness of breath: Secondary | ICD-10-CM

## 2020-08-23 DIAGNOSIS — R0600 Dyspnea, unspecified: Secondary | ICD-10-CM

## 2020-08-23 LAB — LIPID PANEL
Chol/HDL Ratio: 9.9 ratio — ABNORMAL HIGH (ref 0.0–5.0)
Cholesterol, Total: 266 mg/dL — ABNORMAL HIGH (ref 100–199)
HDL: 27 mg/dL — ABNORMAL LOW (ref >39)
LDL Chol Calc (NIH): 108 mg/dL — ABNORMAL HIGH (ref 0–99)
Triglycerides: 742 mg/dL (ref 0–149)
VLDL Cholesterol Cal: 131 mg/dL — ABNORMAL HIGH (ref 5–40)

## 2020-08-23 IMAGING — CT CT CHEST HIGH RESOLUTION W/O CM
2 of 8 series · 14 of 36 positions shown, 17 images · non-contrast
Comparison: CT chest angiogram, [DATE], CT coronary angiogram,
[DATE]

CLINICAL DATA: History of [C7], persistent shortness of breath,
possible ILD

EXAM:
CT CHEST WITHOUT CONTRAST
TECHNIQUE: Multidetector CT imaging of the chest was performed following the
standard protocol without intravenous contrast. High resolution
imaging of the lungs, as well as inspiratory and expiratory imaging,
was performed.

[Series 5: high resolution · axial · 0.66mm/px · z∈[-250,-36]mm · 11 of 257 slices shown, 14 images]
[im 22/257  mediastinal]
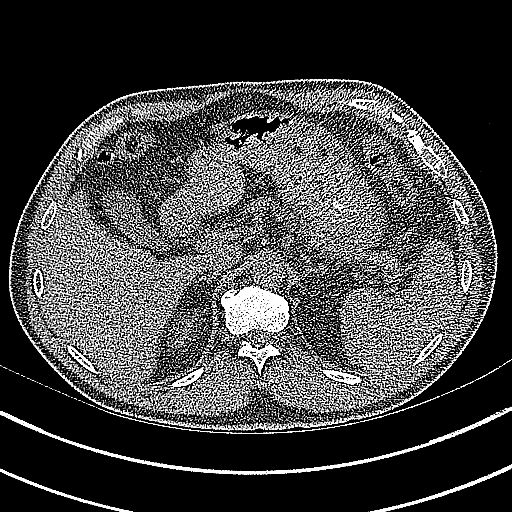
[im 22/257  lung]
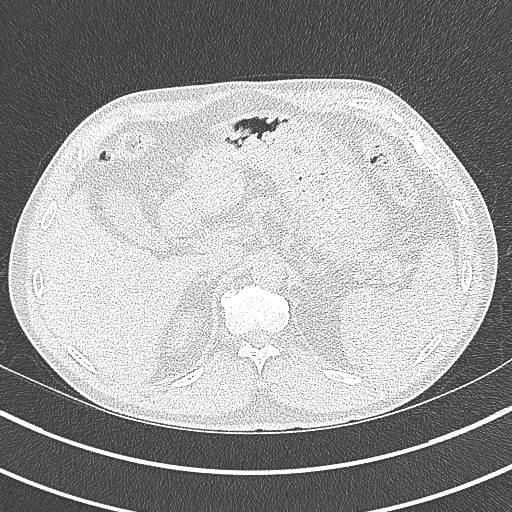
[im 43/257  lung]
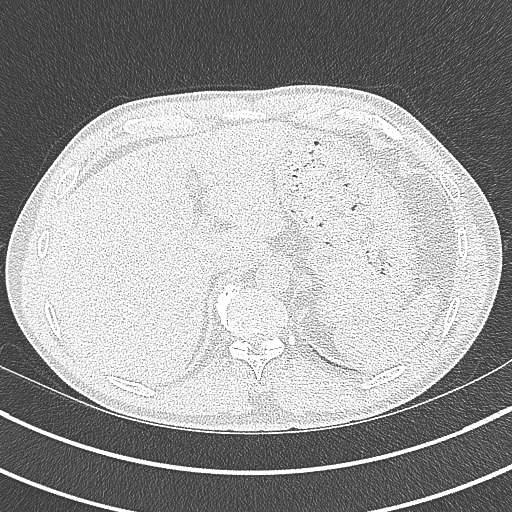
[im 65/257  lung]
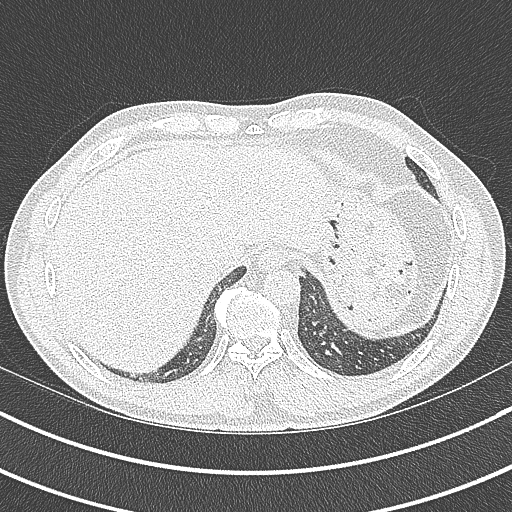
[im 86/257  lung]
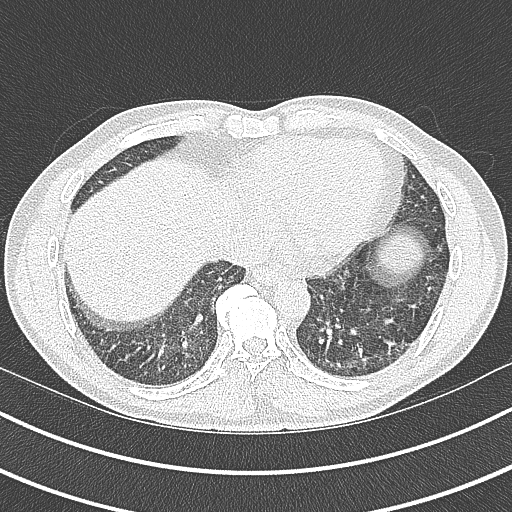
[im 107/257  mediastinal]
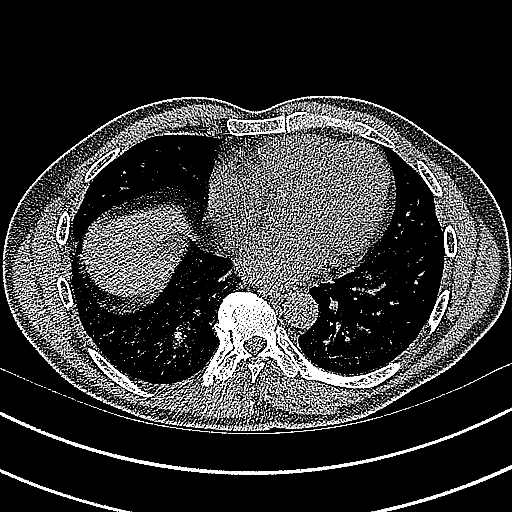
[im 107/257  lung]
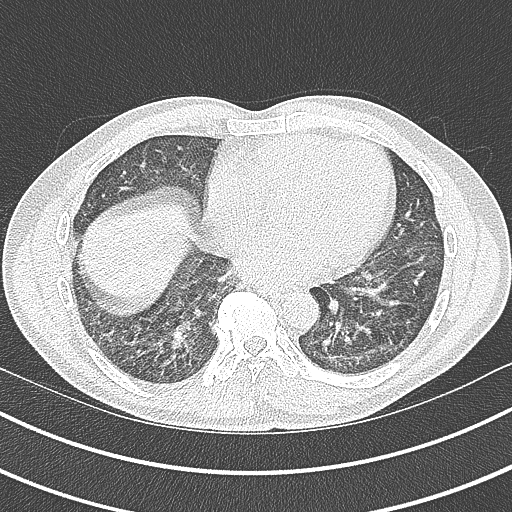
[im 129/257  lung]
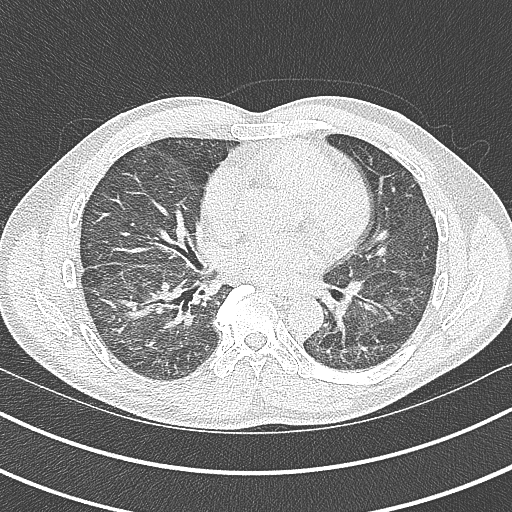
[im 150/257  lung]
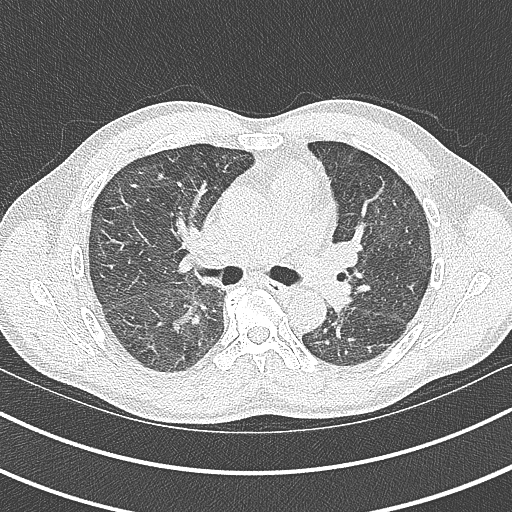
[im 171/257  lung]
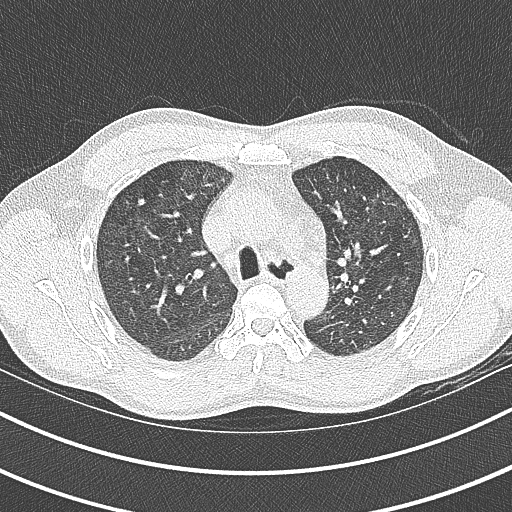
[im 193/257  mediastinal]
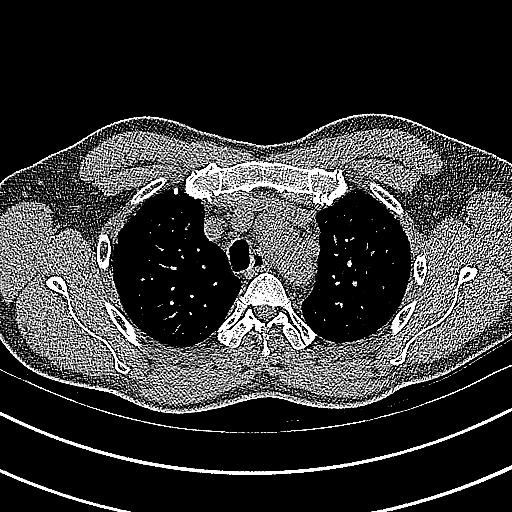
[im 193/257  lung]
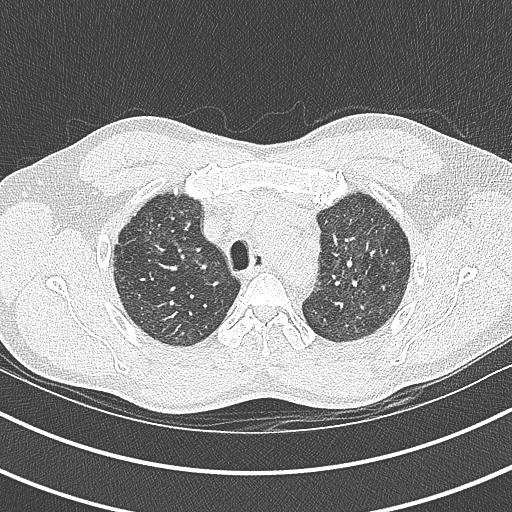
[im 214/257  lung]
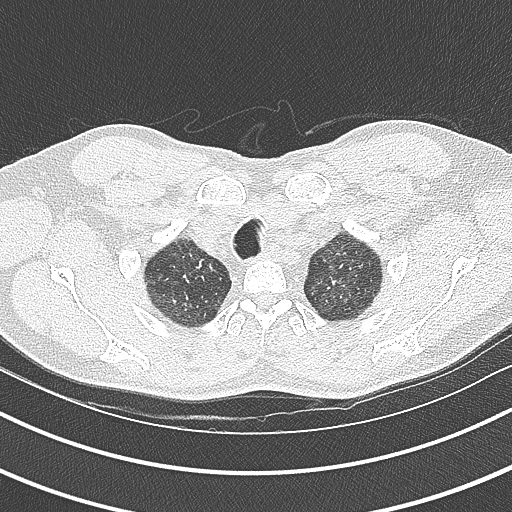
[im 235/257  lung]
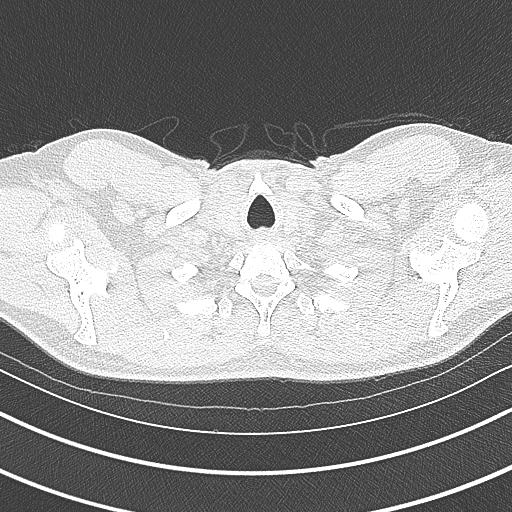

[Series 7: coronal · coronal · 0.53mm/px · 3 of 109 slices shown]
[im 22/109  lung]
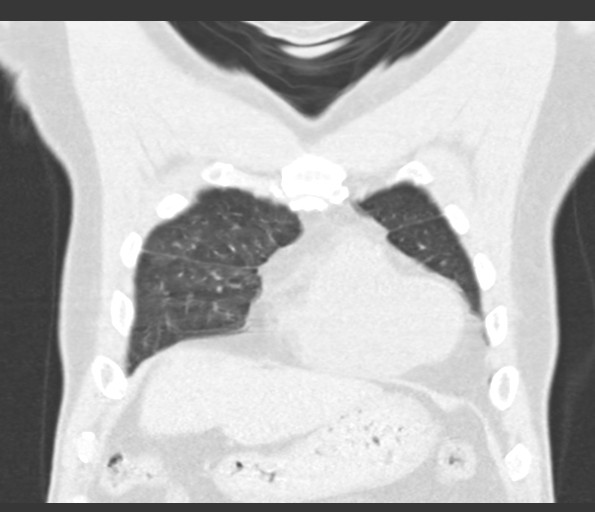
[im 44/109  lung]
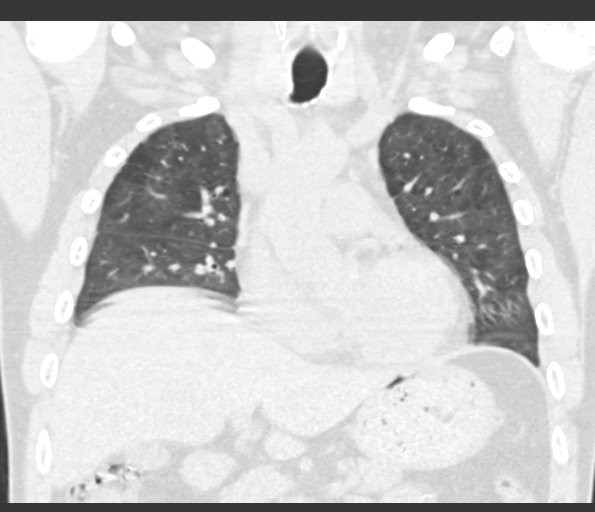
[im 65/109  lung]
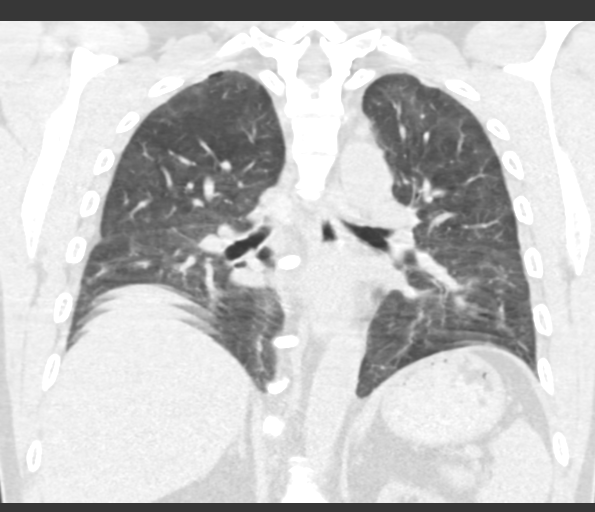

[14 of 36 positions shown; findings below may reference images not displayed]

FINDINGS: Cardiovascular: No significant vascular findings. Normal heart size.
No pericardial effusion.

Mediastinum/Nodes: No enlarged mediastinal, hilar, or axillary lymph
nodes. Thyroid gland, trachea, and esophagus demonstrate no
significant findings.

Lungs/Pleura: Examination is generally limited by breath motion
artifact throughout. There is mild, diffuse irregular ground-glass
and interstitial opacity with some more bandlike scarring at the
lung bases demonstrating subpleural sparing (series 5, image 158).
Partially calcified, benign 4 mm nodule of the anterior right upper
lobe. Acute appearing airspace opacity noted on prior examination
dated [DATE] is resolved. No pleural effusion or pneumothorax.

Upper Abdomen: No acute abnormality.

Musculoskeletal: No chest wall mass or suspicious bone lesions
identified.
IMPRESSION: There is mild, diffuse irregular ground-glass and interstitial
opacity with some more bandlike scarring at the lung bases
demonstrating subpleural sparing. Acute appearing airspace opacity
noted on prior examination dated [DATE] is resolved. Findings
are consistent with mild post infectious scarring and fibrosis.
Consider follow-up ILD protocol CT to further evaluate stability in
1 year.

## 2020-08-23 MED ORDER — ATORVASTATIN CALCIUM 80 MG PO TABS: 80.0000 mg | ORAL_TABLET | Freq: Every day | ORAL | 11 refills | Status: DC

## 2020-08-23 NOTE — Assessment & Plan Note (Signed)
Addendum: Lipid panel came back with total cholesterol 266, triglycerides 742, HDL 27, LDL 108.  10 years ASCVD risk 24.1%.  High intensity statin will be initiated.  Regarding elevated triglycerides 742, patient states that he ate rice before getting labs.  Will repeat fasting lipid panel on next visit.  Plan: -Start Lipitor 80 mg daily -Repeat fasting lipid panel next visit

## 2020-08-26 ENCOUNTER — Other Ambulatory Visit: Payer: Self-pay

## 2020-08-26 ENCOUNTER — Encounter: Payer: Self-pay | Admitting: Adult Health

## 2020-08-26 ENCOUNTER — Ambulatory Visit (INDEPENDENT_AMBULATORY_CARE_PROVIDER_SITE_OTHER): Payer: Self-pay | Admitting: Adult Health

## 2020-08-26 DIAGNOSIS — J8410 Pulmonary fibrosis, unspecified: Secondary | ICD-10-CM

## 2020-08-26 DIAGNOSIS — J45909 Unspecified asthma, uncomplicated: Secondary | ICD-10-CM | POA: Insufficient documentation

## 2020-08-26 DIAGNOSIS — J4540 Moderate persistent asthma, uncomplicated: Secondary | ICD-10-CM

## 2020-08-26 MED ORDER — BUDESONIDE-FORMOTEROL FUMARATE 160-4.5 MCG/ACT IN AERO: 2.0000 | INHALATION_SPRAY | Freq: Two times a day (BID) | RESPIRATORY_TRACT | 12 refills | Status: DC

## 2020-08-26 NOTE — Assessment & Plan Note (Addendum)
Patient has severe airflow obstruction with significant reversibility consistent with an underlying asthma.  Unclear if this all came post Covid or patient had this prior and Covid exacerbated symptoms.  He leads somewhat of a sedentary lifestyle.  Does have a smoking history but has significant reversibility more consistent with asthma than COPD. Suspect his ongoing shortness of breath cough and congestion are from post Covid fibrosis and asthma. Would begin ICS/LABA combo. Consider repeating PFTs in 6 months. Would consider changing ACE inhibitor as may be aggravating cough. Patient has significant dentition issues.  Needs to see a dentist as he is at risk for ongoing infection  Plan Patient Instructions  Begin Symbicort 160 2 puffs Twice daily , rinse after use.  Discuss with Primary MD that Lisinopril may be aggravating your cough  Albuterol inhaler As needed  Wheezing  Make Dentist appointment.  Follow up with Dr. Valeta Harms in 3 months and As needed   Please contact office for sooner follow up if symptoms do not improve or worsen or seek emergency care   B?t ??u Symbicort 160 2 nht Hai l?n m?i ngy, r?a s?ch sau khi s? d?ng. Th?o lu?n v?i bc s? chnh r?ng Lisinopril c th? lm tr?m tr?ng thm c?n ho c?a b?n Thu?c ht Albuterol Khi c?n thi?t Th? kh kh ??t l?ch h?n v?i nha s?Arnette Norris di v?i Ti?n s? Icard trong 3 thng v khi c?n thi?t Vui lng lin h? v?i v?n phng ?? ???c theo di s?m h?n n?u cc tri?u ch?ng khng c?i thi?n ho?c x?u ?i ho?c tm ki?m s? ch?m [place] kh?n c?p

## 2020-08-26 NOTE — Assessment & Plan Note (Signed)
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 Patient does have ongoing dyspnea.  Also has significant asthma with PFTs.  We will begin Symbicort to see if this helps with his symptoms.  We will continue to monitor closely.  Consider repeat PFTs with DLCO in 6 to 12 months.  Or as clinically indicated  Plan  Patient Instructions  Begin Symbicort 160 2 puffs Twice daily , rinse after use.  Discuss with Primary MD that Lisinopril may be aggravating your cough  Albuterol inhaler As needed  Wheezing  Make Dentist appointment.  Follow up with Dr. Valeta Harms in 3 months and As needed   Please contact office for sooner follow up if symptoms do not improve or worsen or seek emergency care   B?t ??u Symbicort 160 2 nht Hai l?n m?i ngy, r?a s?ch sau khi s? d?ng. Th?o lu?n v?i bc s? chnh r?ng Lisinopril c th? lm tr?m tr?ng thm c?n ho c?a b?n Thu?c ht Albuterol Khi c?n thi?t Th? kh kh ??t l?ch h?n v?i nha s?Arnette Norris di v?i Ti?n s? Icard trong 3 thng v khi c?n thi?t Vui lng lin h? v?i v?n phng ?? ???c theo di s?m h?n n?u cc tri?u ch?ng khng c?i thi?n ho?c x?u ?i ho?c tm ki?m s? ch?m [place] kh?n c?p

## 2020-08-26 NOTE — Patient Instructions (Addendum)
Begin Symbicort 160 2 puffs Twice daily , rinse after use.  Discuss with Primary MD that Lisinopril may be aggravating your cough  Albuterol inhaler As needed  Wheezing  Make Dentist appointment.  Follow up with Dr. Valeta Harms in 3 months and As needed   Please contact office for sooner follow up if symptoms do not improve or worsen or seek emergency care   B?t ??u Symbicort 160 2 nht Hai l?n m?i ngy, r?a s?ch sau khi s? d?ng. Th?o lu?n v?i bc s? chnh r?ng Lisinopril c th? lm tr?m tr?ng thm c?n ho c?a b?n Thu?c ht Albuterol Khi c?n thi?t Th? kh kh ??t l?ch h?n v?i nha s?Arnette Norris di v?i Ti?n s? Icard trong 3 thng v khi c?n thi?t Vui lng lin h? v?i v?n phng ?? ???c theo di s?m h?n n?u cc tri?u ch?ng khng c?i thi?n ho?c x?u ?i ho?c tm ki?m s? ch?m [place]is kh?n c?p

## 2020-08-26 NOTE — Progress Notes (Signed)
@Patient  ID: Eric Ingram, male    DOB: [birth year], 59 y.o.   MRN: [id number]  Chief Complaint  Patient presents with  . Follow-up    Dyspnea     Referring provider: Gaylan Gerold, DO  HPI: 59 year old male former smoker seen for pulmonary consult Apr 22, 2020 for follow-up from Covid pneumonia and shortness of breath Patient was diagnosed with COVID-19 infection in March 2021.  He did require hospitalization was complicated by Covid pneumonia.  He required high flow oxygen, remdesivir and dexamethasone.  Patient did have ongoing symptoms and had several hospitalizations and emergency room visits after his COVID-19 infection. Came to [place] in 2001. [place] , does not speak english.    TEST/EVENTS :  2D echo November 01, 2019 showed EF is 60-65%  08/26/2020 Follow up : Covid PNA , Dyspnea (exam was completed with a medical interpreter) Patient presents for a 11-month follow-up.  Patient was seen last visit for pulmonary consult for Covid pneumonia and ongoing shortness of breath.  Patient was diagnosed with COVID-19 infection in March 2021.  He did require hospitalization and was complicated by Covid pneumonia.  He required high flow oxygen remdesivir and dexamethasone.  Patient has had ongoing symptoms of shortness of breath. Complains of increased dyspnea with activity , cough and thick mucus. No breathing problems prior to Covid 19 . No known history of COPD or Asthma .  Does light activities but not strenuous activities.  Patient was set up for pulmonary function testing that showed severe airflow obstruction with significant.  FEV1 62%, ratio 85, FVC 56%.  Significant postbronchodilator response with an 81% change.  Significant mid flow obstruction and significant reversibility DLCO decreased at 59%. Patient also was set up for high-resolution CT chest.  This showed resolution of acute consolidation, mild diffuse irregular groundglass and interstitial opacity with some bandlike scarring at  the lung bases demonstrating subpleural sparing. Patient has no insurance , uses orange card. Gets rx thru [facility] with discount.      No Known Allergies  Immunization History  Administered Date(s) Administered  . Influenza,inj,Quad PF,6+ Mos 12/19/2016, 10/02/2017, 09/03/2018, 09/23/2019, 08/22/2020  . Tdap 01/05/2018    Past Medical History:  Diagnosis Date  . Anemia   . CHF (congestive heart failure) ([place])   . Dental infection 08/02/2018  . GERD (gastroesophageal reflux disease)   . Hypertension   . Shingles rash 07/07/2019  . Thalassemia, alpha ([place])   . Viral gastritis 02/08/2019    Tobacco History: Social History   Tobacco Use  Smoking Status Former Smoker  . Packs/day: 1.00  . Years: 5.00  . Pack years: 5.00  . Types: Cigarettes  . Quit date: 12/15/1999  . Years since quitting: 20.7  Smokeless Tobacco Never Used   Counseling given: Not Answered   Outpatient Medications Prior to Visit  Medication Sig Dispense Refill  . albuterol (VENTOLIN HFA) 108 (90 Base) MCG/ACT inhaler Inhale 1-2 puffs into the lungs every 6 (six) hours as needed for wheezing or shortness of breath. 18 g 0  . amLODipine (NORVASC) 2.5 MG tablet Take 1 tablet (2.5 mg total) by mouth daily. 30 tablet 0  . atorvastatin (LIPITOR) 80 MG tablet Take 1 tablet (80 mg total) by mouth daily. 30 tablet 11  . cyclobenzaprine (FLEXERIL) 5 MG tablet Take 1 tablet (5 mg total) by mouth 3 (three) times daily as needed for muscle spasms. 30 tablet 0  . fluticasone (FLONASE) 50 MCG/ACT nasal spray Place 2 sprays into both nostrils  daily.    . gabapentin (NEURONTIN) 300 MG capsule TAKE 2 CAPSULES BY MOUTH THREE TIMES DAILY. TAKE UP TO 2 CAPSULES IN THE MORNING AND AFTERNOON. CAN TAKE UP TO 3 CAPSULES AT BEDTIME. 180 capsule 2  . hydrocortisone cream 1 % Apply topically 2 (two) times daily. 30 g 0  . lidocaine (LIDODERM) 5 % Place 1 patch onto the skin daily. Remove & Discard patch within 12 hours or as  directed by MD 30 patch 0  . lisinopril (ZESTRIL) 40 MG tablet Take 1 tablet (40 mg total) by mouth daily. 90 tablet 3  . metoprolol succinate (TOPROL-XL) 25 MG 24 hr tablet Take 1 tablet (25 mg total) by mouth at bedtime. IM Program 30 tablet 3  . naproxen (NAPROSYN) 500 MG tablet Take 1 tablet (500 mg total) by mouth 2 (two) times daily as needed. (Patient taking differently: Take 500 mg by mouth 2 (two) times daily as needed for mild pain. ) 60 tablet 2  . pantoprazole (PROTONIX) 40 MG tablet Take 1 tablet (40 mg total) by mouth daily. 30 tablet 3  . sucralfate (CARAFATE) 1 GM/10ML suspension Take 10 mLs (1 g total) by mouth 4 (four) times daily -  with meals and at bedtime. 420 mL 2  . famotidine (PEPCID) 20 MG tablet Take 1 tablet (20 mg total) by mouth 2 (two) times daily for 5 days. (Patient not taking: Reported on 03/12/2020) 10 tablet 0  . fluticasone (FLONASE) 50 MCG/ACT nasal spray Place 2 sprays into both nostrils daily. 16 g 0   No facility-administered medications prior to visit.     Review of Systems:   Constitutional:   No  weight loss, night sweats,  Fevers, chills, + fatigue, or  lassitude.  HEENT:   No headaches,  Difficulty swallowing,  Tooth/dental problems, or  Sore throat,                No sneezing, itching, ear ache, nasal congestion, post nasal drip,   CV:  No chest pain,  Orthopnea, PND, swelling in lower extremities, anasarca, dizziness, palpitations, syncope.   GI  No heartburn, indigestion, abdominal pain, nausea, vomiting, diarrhea, change in bowel habits, loss of appetite, bloody stools.   Resp:    No chest wall deformity  Skin: no rash or lesions.  GU: no dysuria, change in color of urine, no urgency or frequency.  No flank pain, no hematuria   MS:  No joint pain or swelling.  No decreased range of motion.  No back pain.    Physical Exam  BP 100/64 (BP Location: Left Arm, Cuff Size: Normal)   Pulse 70   Temp 99.1 F (37.3 C) (Temporal)   Ht 5'  2" (1.575 m)   Wt 144 lb 3.2 oz (65.4 kg)   SpO2 100% Comment: RA  BMI 26.37 kg/m   GEN: A/Ox3; pleasant , NAD, well nourished    HEENT:  [place]/AT,   NOSE-clear, THROAT-clear, no lesions, no postnasal drip or exudate noted.  Very poor dentition.    NECK:  Supple w/ fair ROM; no JVD; normal carotid impulses w/o bruits; no thyromegaly or nodules palpated; no lymphadenopathy.    RESP faint bibasilar crackles . no accessory muscle use, no dullness to percussion  CARD:  RRR, no m/r/g, no peripheral edema, pulses intact, no cyanosis or clubbing.  GI:   Soft & nt; nml bowel sounds; no organomegaly or masses detected.   Musco: Warm bil, no deformities or joint swelling noted.  Neuro: alert, no focal deficits noted.    Skin: Warm, no lesions or rashes    Lab Results:    ProBNP No results found for: PROBNP  Imaging: CT CHEST HIGH RESOLUTION  Result Date: 08/23/2020 CLINICAL DATA:  History of COVID-19, persistent shortness of breath, possible ILD EXAM: CT CHEST WITHOUT CONTRAST TECHNIQUE: Multidetector CT imaging of the chest was performed following the standard protocol without intravenous contrast. High resolution imaging of the lungs, as well as inspiratory and expiratory imaging, was performed. COMPARISON:  CT chest angiogram, 03/07/2020, CT coronary angiogram, 11/28/2019 FINDINGS: Cardiovascular: No significant vascular findings. Normal heart size. No pericardial effusion. Mediastinum/Nodes: No enlarged mediastinal, hilar, or axillary lymph nodes. Thyroid gland, trachea, and esophagus demonstrate no significant findings. Lungs/Pleura: Examination is generally limited by breath motion artifact throughout. There is mild, diffuse irregular ground-glass and interstitial opacity with some more bandlike scarring at the lung bases demonstrating subpleural sparing (series 5, image 158). Partially calcified, benign 4 mm nodule of the anterior right upper lobe. Acute appearing airspace opacity  noted on prior examination dated 03/07/2020 is resolved. No pleural effusion or pneumothorax. Upper Abdomen: No acute abnormality. Musculoskeletal: No chest wall mass or suspicious bone lesions identified. IMPRESSION: There is mild, diffuse irregular ground-glass and interstitial opacity with some more bandlike scarring at the lung bases demonstrating subpleural sparing. Acute appearing airspace opacity noted on prior examination dated 03/07/2020 is resolved. Findings are consistent with mild post infectious scarring and fibrosis. Consider follow-up ILD protocol CT to further evaluate stability in 1 year. Electronically Signed   By: Eddie Candle M.D.   On: 08/23/2020 09:54      PFT Results Latest Ref Rng & Units 06/12/2020  FVC-Pre L 1.80  FVC-Predicted Pre % 50  FVC-Post L 1.98  FVC-Predicted Post % 56  Pre FEV1/FVC % % 51  Post FEV1/FCV % % 85  FEV1-Pre L 0.92  FEV1-Predicted Pre % 34  FEV1-Post L 1.68  DLCO uncorrected ml/min/mmHg 12.68  DLCO UNC% % 59  DLCO corrected ml/min/mmHg 12.68  DLCO COR %Predicted % 59  DLVA Predicted % 96  TLC L 3.47  TLC % Predicted % 64  RV % Predicted % 63    No results found for: NITRICOXIDE      Assessment & Plan:   Asthma Patient has severe airflow obstruction with significant reversibility consistent with an underlying asthma.  Unclear if this all came post Covid or patient had this prior and Covid exacerbated symptoms.  He leads somewhat of a sedentary lifestyle.  Does have a smoking history but has significant reversibility more consistent with asthma than COPD. Suspect his ongoing shortness of breath cough and congestion are from post Covid fibrosis and asthma. Would begin ICS/LABA combo. Consider repeating PFTs in 6 months. Would consider changing ACE inhibitor as may be aggravating cough. Patient has significant dentition issues.  Needs to see a dentist as he is at risk for ongoing infection  Plan Patient Instructions  Begin  Symbicort 160 2 puffs Twice daily , rinse after use.  Discuss with Primary MD that Lisinopril may be aggravating your cough  Albuterol inhaler As needed  Wheezing  Make Dentist appointment.  Follow up with Dr. Valeta Harms in 3 months and As needed   Please contact office for sooner follow up if symptoms do not improve or worsen or seek emergency care   B?t ??u Symbicort 160 2 nht Hai l?n m?i ngy, r?a s?ch sau khi s? d?ng. Th?o lu?n v?i bc s? chnh r?ng  Lisinopril c th? lm tr?m tr?ng thm c?n ho c?a b?n Thu?c ht Albuterol Khi c?n thi?t Th? kh kh ??t l?ch h?n v?i nha s?Arnette Norris di v?i Ti?n s? Icard trong 3 thng v khi c?n thi?t Vui lng lin h? v?i v?n phng ?? ???c theo di s?m h?n n?u cc tri?u ch?ng khng c?i thi?n ho?c x?u ?i ho?c tm ki?m s? 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 Patient does have ongoing dyspnea.  Also has significant asthma with PFTs.  We will begin Symbicort to see if this helps with his symptoms.  We will continue to monitor closely.  Consider repeat PFTs with DLCO in 6 to 12 months.  Or as clinically indicated  Plan  Patient Instructions  Begin Symbicort 160 2 puffs Twice daily , rinse after use.  Discuss with Primary MD that Lisinopril may be aggravating your cough  Albuterol inhaler As needed  Wheezing  Make Dentist appointment.  Follow up with Dr. Valeta Harms in 3 months and As needed   Please contact office for sooner follow up if symptoms do not improve or worsen or seek emergency care   B?t ??u Symbicort 160 2 nht Hai l?n m?i ngy, r?a s?ch sau khi s? d?ng. Th?o lu?n v?i bc s? chnh r?ng Lisinopril c th? lm tr?m tr?ng thm c?n ho c?a b?n Thu?c ht Albuterol Khi c?n thi?t Th? kh kh ??t l?ch h?n v?i nha s?Arnette Norris di v?i Ti?n s? Icard trong 3 thng v khi c?n thi?t Vui lng lin h? v?i v?n phng ?? ???c theo di s?m h?n n?u cc tri?u ch?ng khng  c?i thi?n ho?c x?u ?i ho?c tm ki?m s? ch?m [place] kh?n c?p        Rexene Edison, NP 08/26/2020

## 2020-08-26 NOTE — Progress Notes (Signed)
Internal Medicine Clinic Attending  I saw and evaluated the patient.  I personally confirmed the key portions of the history and exam documented by Dr. Nguyen and I reviewed pertinent patient test results.  The assessment, diagnosis, and plan were formulated together and I agree with the documentation in the resident's note.\  

## 2020-08-27 ENCOUNTER — Other Ambulatory Visit: Payer: Self-pay | Admitting: Student

## 2020-08-27 DIAGNOSIS — J4540 Moderate persistent asthma, uncomplicated: Secondary | ICD-10-CM

## 2020-08-27 DIAGNOSIS — I10 Essential (primary) hypertension: Secondary | ICD-10-CM

## 2020-08-27 MED ORDER — BUDESONIDE-FORMOTEROL FUMARATE 160-4.5 MCG/ACT IN AERO: 2.0000 | INHALATION_SPRAY | Freq: Two times a day (BID) | RESPIRATORY_TRACT | 12 refills | Status: DC

## 2020-08-27 MED ORDER — VALSARTAN 80 MG PO TABS: 80.0000 mg | ORAL_TABLET | Freq: Every day | ORAL | 11 refills | Status: DC

## 2020-08-27 NOTE — Progress Notes (Signed)
I called and spoke to the patient.  Patient was seen by pulmonologist the day prior for dyspnea.  The pulmonologist recommended switching the lisinopril due to possible side effect of coughing.  I stopped lisinopril and start valsartan 80 mg daily.  I explained multiple times to the patient that he needs to stop lisinopril and start taking valsartan daily.  I emphasized that he must not take both of his medication at the same time.  Patient voiced understanding.  He said he will pick up medication tomorrow.  I will call back tomorrow to make sure patient is on the correct medication.  All questions were answered.  Gaylan Gerold, DO

## 2020-09-11 ENCOUNTER — Encounter: Payer: Self-pay | Admitting: Student

## 2020-09-11 ENCOUNTER — Other Ambulatory Visit: Payer: Self-pay | Admitting: Student

## 2020-09-11 ENCOUNTER — Ambulatory Visit (INDEPENDENT_AMBULATORY_CARE_PROVIDER_SITE_OTHER): Payer: Self-pay | Admitting: Student

## 2020-09-11 VITALS — BP 141/94 | HR 93 | Temp 98.8°F | Ht 62.0 in | Wt 141.7 lb

## 2020-09-11 DIAGNOSIS — M542 Cervicalgia: Secondary | ICD-10-CM

## 2020-09-11 DIAGNOSIS — I10 Essential (primary) hypertension: Secondary | ICD-10-CM

## 2020-09-11 DIAGNOSIS — M5412 Radiculopathy, cervical region: Secondary | ICD-10-CM

## 2020-09-11 DIAGNOSIS — R0602 Shortness of breath: Secondary | ICD-10-CM

## 2020-09-11 DIAGNOSIS — R059 Cough, unspecified: Secondary | ICD-10-CM

## 2020-09-11 MED ORDER — BENZONATATE 100 MG PO CAPS: 100.0000 mg | ORAL_CAPSULE | Freq: Three times a day (TID) | ORAL | 0 refills | Status: AC | PRN

## 2020-09-11 MED ORDER — CYCLOBENZAPRINE HCL 5 MG PO TABS: 5.0000 mg | ORAL_TABLET | Freq: Three times a day (TID) | ORAL | 1 refills | Status: DC | PRN

## 2020-09-11 MED ORDER — BENZONATATE 100 MG PO CAPS: 100.0000 mg | ORAL_CAPSULE | Freq: Three times a day (TID) | ORAL | 0 refills | Status: DC | PRN

## 2020-09-11 NOTE — Congregational Nurse Program (Signed)
CN office visit with interpreter Diu Hartshorn interpreting.  Patient states he has not slept for 5 nights primarily due to pain in left posterior shoulder, neck and head.  He also has a productive cough present about 10 days.  Requesting MD appointment.  Scheduled at [facility] with Dr. Alfonse Spruce for this afternoon.  Jake Michaelis RN, Congregational Nurse [phone]

## 2020-09-11 NOTE — Assessment & Plan Note (Addendum)
Blood pressure 140/90 today.  His blood pressure is elevated likely due to due to a component of pain and shortness of breath.  Will not make any changes in medications today.  Plan: -Continue valsartan 80 mg daily

## 2020-09-11 NOTE — Assessment & Plan Note (Addendum)
Patient has chronic neck and shoulder pain with left cervical radiculopathy.  Patient states that pain has been worsening the last 7 days which affecting his sleep.  Patient is taking Tylenol and gabapentin for pain.  It seems that he is not taking his prescription of Flexeril.    Nerve impingement is suspected due to weakness and decreased sensation of left arm.  Cervical MRI is ordered.  Will consult neurosurgery depends on the results of MRI.  Will refill Flexeril today.  Plan: -Continue Tylenol as needed for pain -Continue gabapentin -Refill Flexeril -Obtain cervical MRI  Addendum:  Cervical MRI shows central canal narrowing at multiple levels, greatest at C5-6, no abnormal cord signal.  There is also foraminal narrowing at multiple level, most near at C5-6.  These findings consistent with his symptoms of cervical radiculopathy.  Referral to neurosurgery placed.  Contacted patient's daughter for the results and neurosurgery referral.  She agrees with the plan.  Patient declined the ED neurosurgery referral.  Per notes, he does not want to pay out-of-pocket and also does not have the transportation.  I called his daughter but she does not have a lot of information about this.  I called the patient on his cell phone but could not leave a voicemail.  Will discuss this again in his next follow-up visit.

## 2020-09-11 NOTE — Progress Notes (Signed)
   CC: Neck pain and shortness of breath  HPI:  [name] is a 59 y.o. with past medical history of hypertension, hyperlipidemia and GERD who is here for chronic neck pain and shortness of breath after Covid pneumonia.  Please see problem based charting for further details  Past Medical History:  Diagnosis Date  . Anemia   . CHF (congestive heart failure) ([place])   . Dental infection 08/02/2018  . GERD (gastroesophageal reflux disease)   . Hypertension   . Shingles rash 07/07/2019  . Thalassemia, alpha ([place])   . Viral gastritis 02/08/2019   Review of Systems: As per HPI  Physical Exam:  Vitals:   09/11/20 1612  BP: (!) 141/94  Pulse: 93  Temp: 98.8 F (37.1 C)  TempSrc: Oral  SpO2: 99%  Weight: 141 lb 11.2 oz (64.3 kg)  Height: 5\' 2"  (1.575 m)   Physical Exam Constitutional:      General: He is not in acute distress. HENT:     Head: Normocephalic.  Eyes:     General:        Right eye: No discharge.        Left eye: No discharge.  Cardiovascular:     Rate and Rhythm: Normal rate and regular rhythm.     Heart sounds: No murmur heard.   Pulmonary:     Breath sounds: No wheezing.     Comments: Mild crackles at bilateral lung bases Abdominal:     General: Bowel sounds are normal.  Musculoskeletal:     Cervical back: Normal range of motion.  Skin:    General: Skin is warm.     Coloration: Skin is not jaundiced.  Neurological:     Mental Status: He is alert.     Comments: +5 strength right upper extremity +4 strength left upper extremity Decreased sensation of medial forearm.  Psychiatric:        Mood and Affect: Mood normal.     Assessment & Plan:   See Encounters Tab for problem based charting.  Patient seen with Dr. Jimmye Norman

## 2020-09-11 NOTE — Addendum Note (Signed)
Addended byGaylan Gerold on: 09/11/2020 05:14 PM   Modules accepted: Orders

## 2020-09-11 NOTE — Patient Instructions (Signed)
[name],  It is a pleasure of seeing you today.  Here is a summary of what we talked about  1.  Neck pain: I will get a cervical MRI to assess for nerve impingement.  Please continue taking Tylenol and gabapentin.  Please get Flexeril at the outpatient pharmacy, you can take it 3 times a day as needed for pain.  2.  Cough: This is likely due to the Covid pneumonia you had in March and also asthma.  Please continue using the Symbicort and the Ventolin.  I will add Benzonatate for cough, please take it 3 times a day as needed for cough.  Please follow-up with your pulmonologist in 3 months.  Please come back in 3 months, sooner as needed.  Call us for any questions or concerns.  Take care  Dr. Alfonse Spruce

## 2020-09-11 NOTE — Assessment & Plan Note (Signed)
Patient complains of shortness of breath with exertion and worsening productive cough started about 1 month ago.  He did get Covid in March 2021 and was hospitalized and treated with high flow oxygen, remdesivir and dexamethasone.  Patient saw a pulmonologist on 08/26/20 and had a CT chest which showed scarring or fibrosis likely due to Covid pneumonia.  Prior chest x-ray in 2020 was clear.  PFTs revealed underlying asthma with FEV/FVC of 51 and improved to 81 after bronchodilator.  Patient also has history of smoking but quit 20 years ago.  This is likely consequences of Covid pneumonia evidenced on imaging.  Patient is advised that it would take time for his lungs to heal.  He is encouraged to continue the Symbicort and albuterol inhaler as needed for shortness of breath.  Added benzonatate as needed for cough.  Lisinopril will DC to avoid exacerbation of his cough.  Patient will follow up with his pulmonologist in 3 months.  Plan: -Continue Symbicort -Albuterol as needed -Benzonatate for cough -Follow-up with his pulmonologist in 3 months

## 2020-09-12 NOTE — Progress Notes (Signed)
09/11/20:  Internal Medicine Clinic Attending  I saw and evaluated the patient.  I personally confirmed the key portions of the history and exam documented by Dr. Alfonse Spruce and I reviewed pertinent patient test results.  The assessment, diagnosis, and plan were formulated together and I agree with the documentation in the residents note.

## 2020-09-19 NOTE — Progress Notes (Signed)
PCCM:  Thanks for seeing him   Garner Nash, DO [facility] 09/19/2020 12:19 AM

## 2020-09-24 ENCOUNTER — Ambulatory Visit (HOSPITAL_COMMUNITY)
Admission: RE | Admit: 2020-09-24 | Discharge: 2020-09-24 | Disposition: A | Discharge status: Home / Self Care | Payer: Self-pay | Source: Ambulatory Visit | Attending: Internal Medicine | Admitting: Internal Medicine

## 2020-09-24 ENCOUNTER — Other Ambulatory Visit: Payer: Self-pay

## 2020-09-24 DIAGNOSIS — M5412 Radiculopathy, cervical region: Secondary | ICD-10-CM | POA: Insufficient documentation

## 2020-09-24 IMAGING — MR MR CERVICAL SPINE W/O CM
4 of 6 series · 18 of 48 positions shown · non-contrast
Comparison: Cervical spine radiographs [DATE]

CLINICAL DATA: Cervical radiculopathy. Back pain extending to head.
Difficulty breathing. Symptoms for approximately.

EXAM:
MRI CERVICAL SPINE WITHOUT CONTRAST
TECHNIQUE: Multiplanar, multisequence MR imaging of the cervical spine was
performed. No intravenous contrast was administered.

[Series 2: T2 · sagittal · 3.0mm · 0.35mm/px · 3 of 16 slices shown (1 of 2)]
[im 1/16]
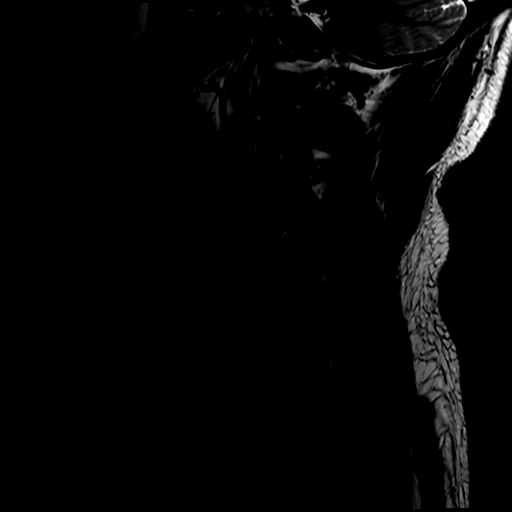
[im 8/16]
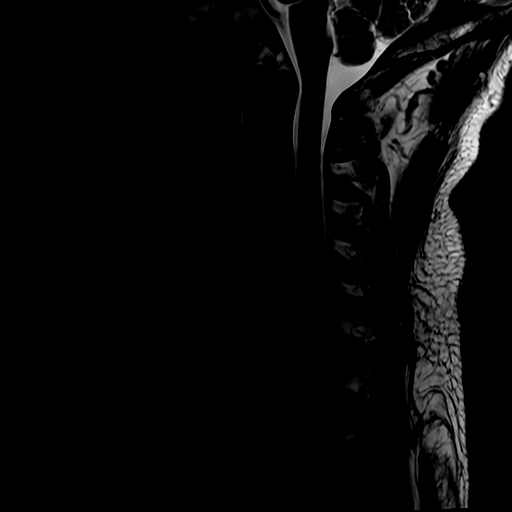
[im 16/16]
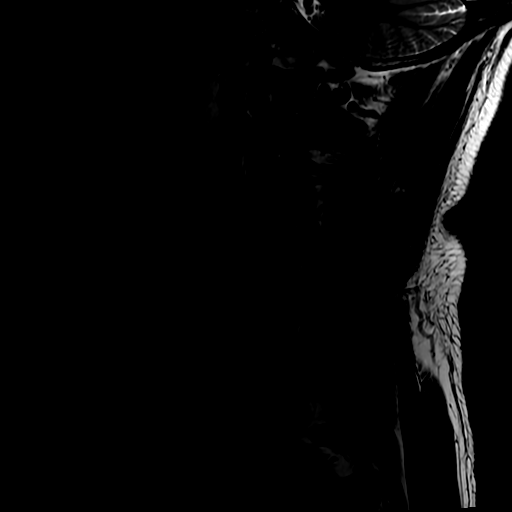

[Series 3: STIR · sagittal · 3.0mm · 0.35mm/px · 3 of 16 slices shown]
[im 1/16]
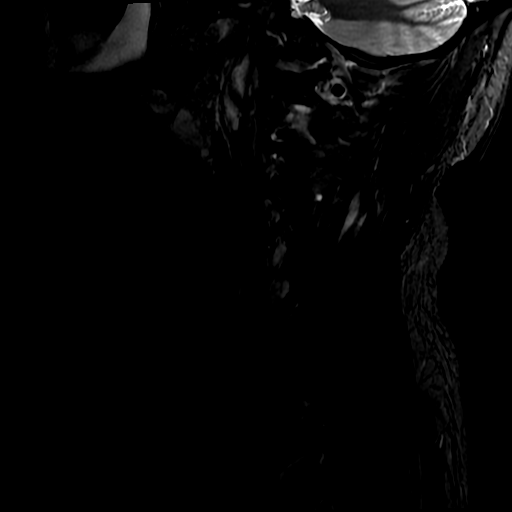
[im 11/16]
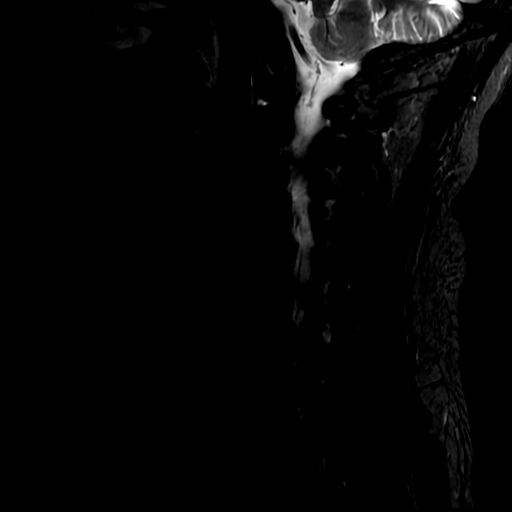
[im 16/16]
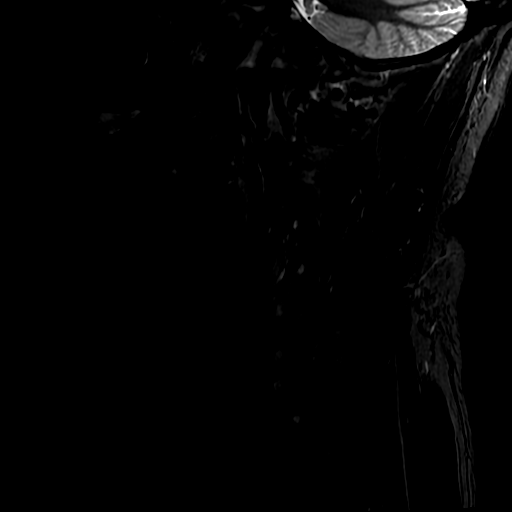

[Series 6: T2 · axial · 3.0mm · 0.35mm/px · z∈[-97,+11]mm · 7 of 34 slices shown (2 of 2)]
[im 1/34]
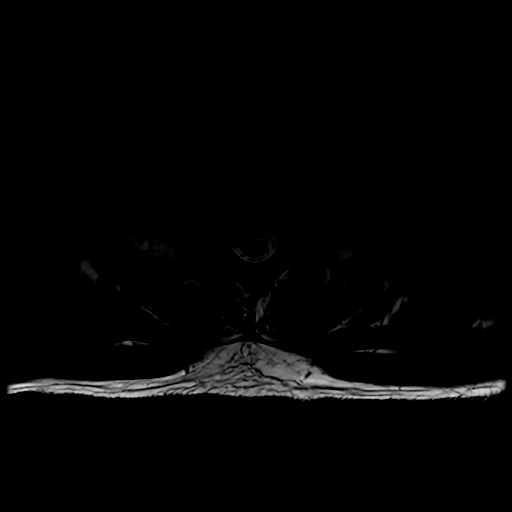
[im 6/34]
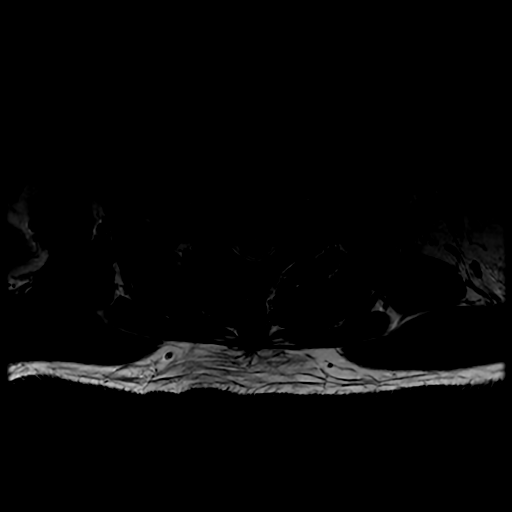
[im 12/34]
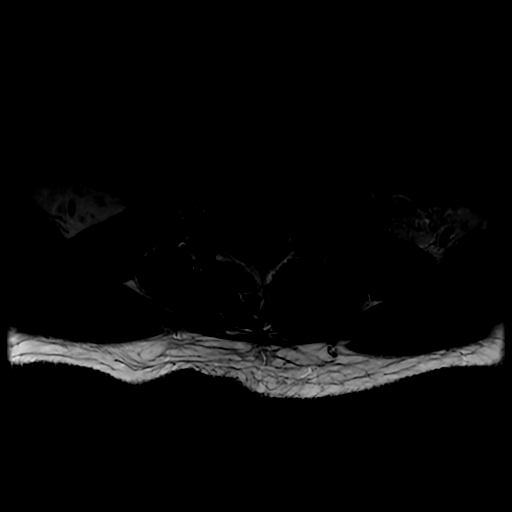
[im 17/34]
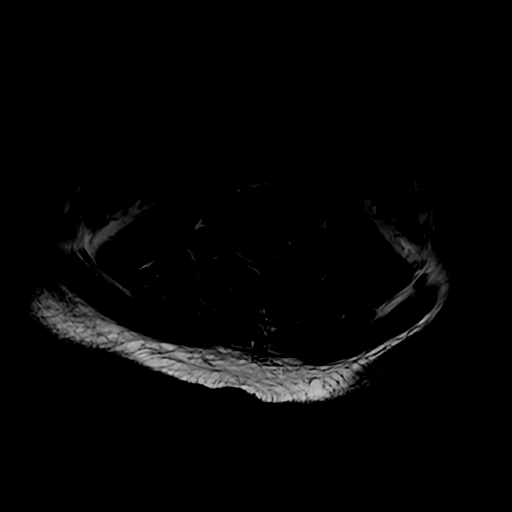
[im 23/34]
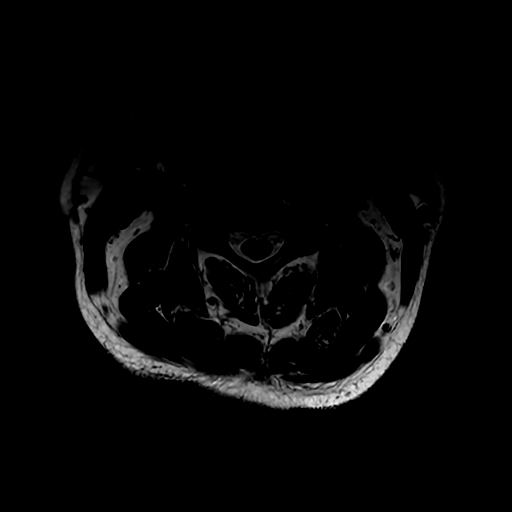
[im 28/34]
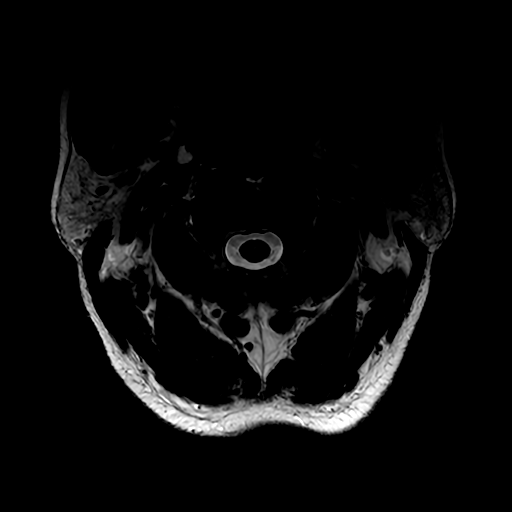
[im 34/34]
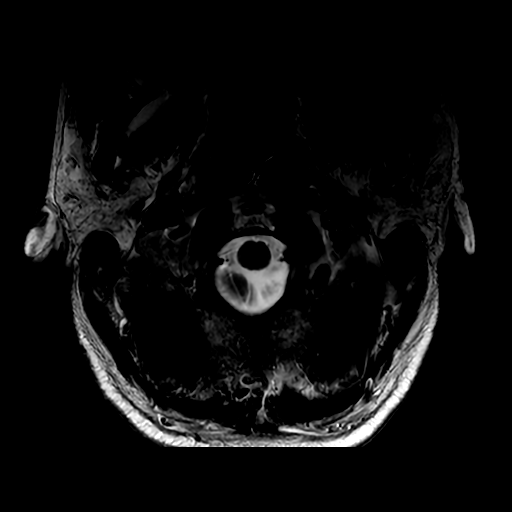

[Series 7: T1 · axial · non-contrast · 3.0mm · 0.35mm/px · z∈[-97,-8]mm · 5 of 32 slices shown]
[im 1/32]
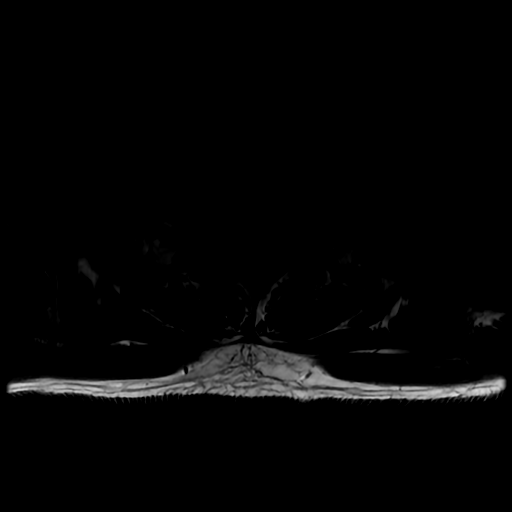
[im 6/32]
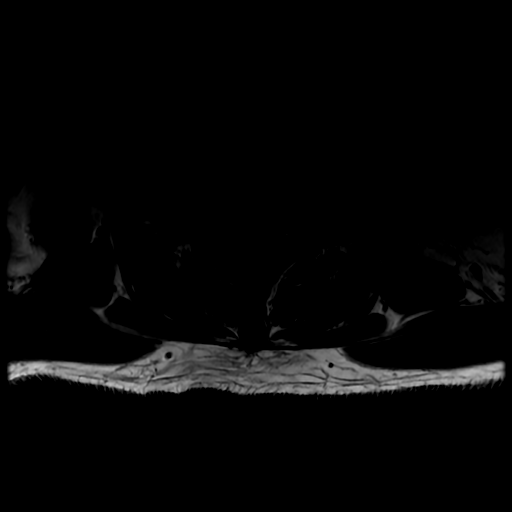
[im 11/32]
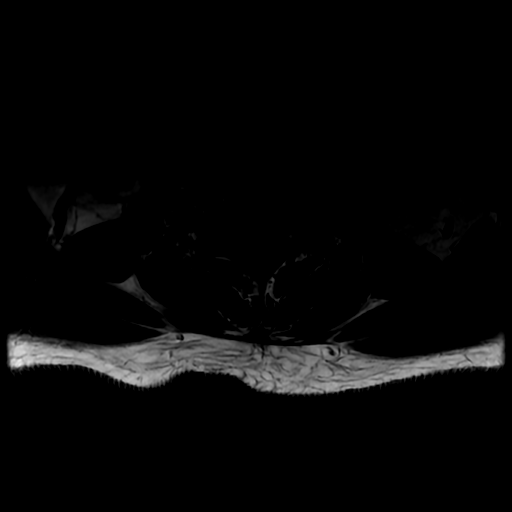
[im 16/32]
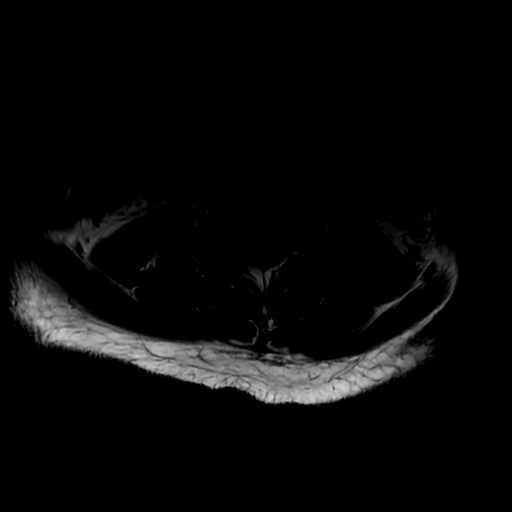
[im 26/32]
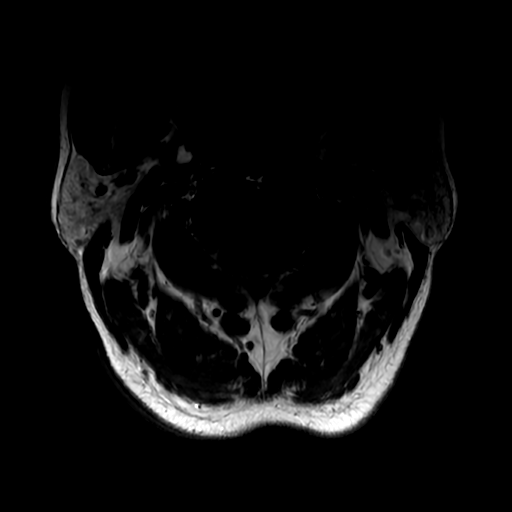

[18 of 48 positions shown; findings below may reference images not displayed]

FINDINGS: Alignment: Slight degenerative retrolisthesis at C4-5 and C5-6 is
stable. Straightening of the normal cervical lordosis is noted.

Vertebrae: Chronic endplate marrow changes evident at C5-6 and on
the right at C6-7. Vertebral body heights are maintained.

Cord: Normal signal and morphology.

Posterior Fossa, vertebral arteries, paraspinal tissues:
Craniocervical junction is normal. Flow is present in the vertebral
arteries bilaterally. Visualized intracranial contents are normal.

Disc levels:

C2-3: Facet hypertrophy is present bilaterally. Significant
protrusion present. Foraminal narrowing is present bilaterally
greater.

C3-4: Asymmetric right-sided uncovertebral and facet hypertrophy
contributes moderate right foraminal narrowing. The foramen patent.

C4-5: A broad-based disc osteophyte complex present. There is
effacement of the ventral CSF with slight flattening of ventral
surface of the cord. Moderate right foraminal narrowing is present.
The left foramen is patent.

C5-6: A broad-based disc osteophyte complex present. There is
effacement the ventral CSF. Severe foraminal narrowing is worse left
than right.

C6-7: Broad-based disc osteophyte complex present. Partial
effacement the ventral CSF is noted. Moderate bilateral foraminal
stenosis is present.

C7-T1: A leftward soft disc protrusion scratched at a broad-based
disc osteophyte complex is present. Disc material extends into the
foramina bilaterally, left greater than right. Moderate left and
mild right foraminal narrowing is present. The central is patent.
IMPRESSION: 1. Multilevel spondylosis of the cervical spine as described.
2. Central canal stenosis is greatest at C5-6 where there is
effacement of the ventral CSF and slight flattening of the ventral
surface of the cord but no abnormal cord signal.
3. Mild central canal narrowing at C4-5 and C6-7.
4. Moderate right foraminal stenosis at C3-4 and C4-5.
5. Severe foraminal narrowing bilaterally at C5-6 is worse on the
left.
6. Moderate bilateral foraminal narrowing at C6-7.
7. Moderate left and mild right foraminal stenosis at C7-T1.

## 2020-09-26 ENCOUNTER — Other Ambulatory Visit: Payer: Self-pay | Admitting: Student

## 2020-09-26 DIAGNOSIS — M5412 Radiculopathy, cervical region: Secondary | ICD-10-CM

## 2020-09-26 NOTE — Progress Notes (Signed)
I called and spoke to the patient's daughter, [name].  I explained to her that the patient cervical MRI shows some and narrowing of the central canal and foramina at multiple levels.  I will send a ambulatory referral to neurosurgery for further evaluation.  All questions was answered.  She verbalizes understanding and will inform the patient.  I will also call the patient later.

## 2020-10-11 ENCOUNTER — Ambulatory Visit (INDEPENDENT_AMBULATORY_CARE_PROVIDER_SITE_OTHER): Payer: Self-pay | Admitting: Internal Medicine

## 2020-10-11 ENCOUNTER — Other Ambulatory Visit: Payer: Self-pay | Admitting: Internal Medicine

## 2020-10-11 ENCOUNTER — Encounter: Payer: Self-pay | Admitting: Internal Medicine

## 2020-10-11 DIAGNOSIS — M5412 Radiculopathy, cervical region: Secondary | ICD-10-CM

## 2020-10-11 DIAGNOSIS — B0229 Other postherpetic nervous system involvement: Secondary | ICD-10-CM

## 2020-10-11 MED ORDER — GABAPENTIN 300 MG PO CAPS: ORAL_CAPSULE | ORAL | 2 refills | Status: DC

## 2020-10-11 MED ORDER — DULOXETINE HCL 30 MG PO CPEP: 30.0000 mg | ORAL_CAPSULE | Freq: Every day | ORAL | 1 refills | Status: DC

## 2020-10-11 NOTE — Assessment & Plan Note (Addendum)
Eric Ingram is a 59 yo M w/ PMH of HFpEF, GERD, HTN presenting to [facility] for follow up for his neck and shoulder pain. He has been continuing to have intermittent episodes of severe pain oh his neck with radiation down his spine and to his arms. He also mentions intermittent numbness, tingling and weakness, especially of his left arm. He mentions taking gabapentin with mild improvement but severity of his pain is still enough to prevent him from sleeping. He mentions that he had the MRI done but was unsure about his results. He was told that he needs to be referred to neurosurgery but is unable to do so due to transportation, financial difficulties.  A/P Reviewed MRI imaging with Eric Ingram. Discussed lack of definitive medical options and need to f/u with neurosurgery. Eric Ingram expressed understanding and agrees to f/u with neurosurg. Unfortunately options limited due to lack of insurance. Would need to f/u with [facility] or [facility] for charity care.  - F/u with neurosurgery - C/w Tylenol - Increase gabapentin dose to 900mg  TID - Start duloxetine 30mg  daily - Discussed need to come to ED emergently for red-flag symptoms

## 2020-10-11 NOTE — Assessment & Plan Note (Signed)
Has hx of chronic left sided weakness due to post-herpetic neuralgia. Needs shingles vaccine but would not be able to afford. Currently on gabapentin for neuropathic pain. - C/w gabapentin - Add on dulxoetine

## 2020-10-11 NOTE — Patient Instructions (Addendum)
Thank you for allowing [place] to provide your care today. Today we discussed your neck pain  I have ordered no labs for you. I will call if any are abnormal.    Today we made the following changes to your medications.    Please start duloxetine 30mg  daily Please titrate your gabapentin up to 900mg  TID as needed  Please follow-up as needed.    Should you have any questions or concerns please call the internal medicine clinic at [phone].     Cervical Radiculopathy  Cervical radiculopathy means that a nerve in the neck (a cervical nerve) is pinched or bruised. This can happen because of an injury to the cervical spine (vertebrae) in the neck, or as a normal part of getting older. This can cause pain or loss of feeling (numbness) that runs from your neck all the way down to your arm and fingers. Often, this condition gets better with rest. Treatment may be needed if the condition does not get better. What are the causes?  A neck injury.  A bulging disk in your spine.  Muscle movements that you cannot control (muscle spasms).  Tight muscles in your neck due to overuse.  Arthritis.  Breakdown in the bones and joints of the spine (spondylosis) due to getting older.  Bone spurs that form near the nerves in the neck. What are the signs or symptoms?  Pain. The pain may: ? Run from the neck to the arm and hand. ? Be very bad or irritating. ? Be worse when you move your neck.  Loss of feeling or tingling in your arm or hand.  Weakness in your arm or hand, in very bad cases. How is this treated? In many cases, treatment is not needed for this condition. With rest, the condition often gets better over time. If treatment is needed, options may include:  Wearing a soft neck collar (cervical collar) for short periods of time, as told by your doctor.  Doing exercises (physical therapy) to strengthen your neck muscles.  Taking medicines.  Having shots (injections) in your spine, in  very bad cases.  Having surgery. This may be needed if other treatments do not help. The type of surgery that is used depends on the cause of your condition. Follow these instructions at home: If you have a soft neck collar:  Wear it as told by your doctor. Remove it only as told by your doctor.  Ask your doctor if you can remove the collar for cleaning and bathing. If you are allowed to remove the collar for cleaning or bathing: ? Follow instructions from your doctor about how to remove the collar safely. ? Clean the collar by wiping it with mild soap and water and drying it completely. ? Take out any removable pads in the collar every 1-2 days. Wash them by hand with soap and water. Let them air-dry completely before you put them back in the collar. ? Check your skin under the collar for redness or sores. If you see any, tell your doctor. Managing pain      Take over-the-counter and prescription medicines only as told by your doctor.  If told, put ice on the painful area. ? If you have a soft neck collar, remove it as told by your doctor. ? Put ice in a plastic bag. ? Place a towel between your skin and the bag. ? Leave the ice on for 20 minutes, 2-3 times a day.  If using ice does not help,  you can try using heat. Use the heat source that your doctor recommends, such as a moist heat pack or a heating pad. ? Place a towel between your skin and the heat source. ? Leave the heat on for 20-30 minutes. ? Remove the heat if your skin turns bright red. This is very important if you are unable to feel pain, heat, or cold. You may have a greater risk of getting burned.  You may try a gentle neck and shoulder rub (massage). Activity  Rest as needed.  Return to your normal activities as told by your doctor. Ask your doctor what activities are safe for you.  Do exercises as told by your doctor or physical therapist.  Do not lift anything that is heavier than 10 lb (4.5 kg) until your  doctor tells you that it is safe. General instructions  Use a flat pillow when you sleep.  Do not drive while wearing a soft neck collar. If you do not have a soft neck collar, ask your doctor if it is safe to drive while your neck heals.  Ask your doctor if the medicine prescribed to you requires you to avoid driving or using heavy machinery.  Do not use any products that contain nicotine or tobacco, such as cigarettes, e-cigarettes, and chewing tobacco. These can delay healing. If you need help quitting, ask your doctor.  Keep all follow-up visits as told by your doctor. This is important. Contact a doctor if:  Your condition does not get better with treatment. Get help right away if:  Your pain gets worse and is not helped with medicine.  You lose feeling or feel weak in your hand, arm, face, or leg.  You have a high fever.  You have a stiff neck.  You cannot control when you poop or pee (have incontinence).  You have trouble with walking, balance, or talking. Summary  Cervical radiculopathy means that a nerve in the neck is pinched or bruised.  A nerve can get pinched from a bulging disk, arthritis, an injury to the neck, or other causes.  Symptoms include pain, tingling, or loss of feeling that goes from the neck into the arm or hand.  Weakness in your arm or hand can happen in very bad cases.  Treatment may include resting, wearing a soft neck collar, and doing exercises. You might need to take medicines for pain. In very bad cases, shots or surgery may be needed. This information is not intended to replace advice given to you by your health care provider. Make sure you discuss any questions you have with your health care provider. Document Revised: 10/21/2018 Document Reviewed: 10/21/2018 Elsevier Patient Education  2020 [employer].

## 2020-10-11 NOTE — Progress Notes (Signed)
CC: Neck pain  HPI: [name] is a 59 y.o. with PMH listed below presenting with complaint of neck pain. Please see problem based assessment and plan for further details.  Past Medical History:  Diagnosis Date  . Anemia   . CHF (congestive heart failure) ([place])   . Dental infection 08/02/2018  . GERD (gastroesophageal reflux disease)   . Hypertension   . Shingles rash 07/07/2019  . Thalassemia, alpha ([place])   . Viral gastritis 02/08/2019   Review of Systems: Review of Systems  Constitutional: Negative for chills, fever and malaise/fatigue.  Respiratory: Positive for shortness of breath. Negative for cough, sputum production and wheezing.   Cardiovascular: Negative for chest pain, palpitations and leg swelling.  Gastrointestinal: Negative for constipation, diarrhea, nausea and vomiting.  Musculoskeletal: Positive for back pain, joint pain and neck pain. Negative for falls.  Neurological: Positive for sensory change and focal weakness.  Psychiatric/Behavioral: The patient has insomnia.   All other systems reviewed and are negative.    Physical Exam: Vitals:   10/11/20 1405  BP: (!) 144/92  Pulse: (!) 110  Temp: 98.3 F (36.8 C)  TempSrc: Oral  SpO2: 98%  Weight: 144 lb 3.2 oz (65.4 kg)  Height: 5\' 2"  (1.575 m)   Gen: Well-developed, well nourished, appear uncomfortable HEENT: NCAT head, hearing intact CV: RRR, S1, S2 normal Pulm: CTAB, No rales, no wheezes Extm: LUE range of motion limited by pain, Peripheral pulses intact, No peripheral edema Skin: Dry, Warm, normal turgor, no rash Neurologic exam: Mental status: A&Ox3 Cranial Nerves:             II: PERRL             III, IV, VI: Extra-occular motions intact bilaterally             V, VII: Face symmetric, sensation intact in all 3 divisions               VIII: hearing normal to rubbing fingers bilaterally               IX, X: palate rises symmetrically             XI: Head turn and shoulder shrug normal  bilaterally               XII: tongue midline    Motor: RUE strength 4/5, LUE strength 2/5, RLE strength 4/5, LLE strength 4/5 Deep Tendon Reflexes: 2+ symmetric  Gait: Normal  Sensory: Light touch intact and symmetric bilaterally  Coordination: There is no dysmetria on finger-to-nose. Psychiatric: Normal mood and affect  Assessment & Plan:   Cervical radiculopathy [name] is a 59 yo M w/ PMH of HFpEF, GERD, HTN presenting to [facility] for follow up for his neck and shoulder pain. He has been continuing to have intermittent episodes of severe pain oh his neck with radiation down his spine and to his arms. He also mentions intermittent numbness, tingling and weakness, especially of his left arm. He mentions taking gabapentin with mild improvement but severity of his pain is still enough to prevent him from sleeping. He mentions that he had the MRI done but was unsure about his results. He was told that he needs to be referred to neurosurgery but is unable to do so due to transportation, financial difficulties.  A/P Reviewed MRI imaging with [name]. Discussed lack of definitive medical options and need to f/u with neurosurgery. [name] expressed understanding and agrees to f/u with neurosurg. Unfortunately options limited  due to lack of insurance. Would need to f/u with [facility] or [facility] for charity care.  - F/u with neurosurgery - C/w Tylenol - Increase gabapentin dose to 900mg  TID - Start duloxetine 30mg  daily - Discussed need to come to ED emergently for red-flag symptoms  Post herpetic neuralgia Has hx of chronic left sided weakness due to post-herpetic neuralgia. Needs shingles vaccine but would not be able to afford. Currently on gabapentin for neuropathic pain. - C/w gabapentin - Add on dulxoetine    Patient discussed with Dr. Jimmye Norman  -Gilberto Better, [facility] Pager: [phone]

## 2020-10-14 NOTE — Progress Notes (Signed)
Internal Medicine Clinic Attending  Case discussed with Dr. Lee  At the time of the visit.  We reviewed the resident's history and exam and pertinent patient test results.  I agree with the assessment, diagnosis, and plan of care documented in the resident's note.    

## 2020-10-28 ENCOUNTER — Telehealth: Payer: Self-pay

## 2020-10-28 ENCOUNTER — Encounter: Payer: Self-pay | Admitting: Pulmonary Disease

## 2020-10-29 DIAGNOSIS — G959 Disease of spinal cord, unspecified: Secondary | ICD-10-CM | POA: Diagnosis not present

## 2020-10-30 ENCOUNTER — Other Ambulatory Visit: Payer: Self-pay | Admitting: Student

## 2020-10-30 DIAGNOSIS — M5412 Radiculopathy, cervical region: Secondary | ICD-10-CM

## 2020-10-31 ENCOUNTER — Other Ambulatory Visit: Payer: Self-pay | Admitting: Student

## 2020-11-13 ENCOUNTER — Telehealth: Payer: Self-pay

## 2020-11-13 NOTE — Telephone Encounter (Signed)
Patient called regarding Medicaid application. CN called DSS and learned that it is still pending at the Disability Determination services in [place].  Jake Michaelis RN, [phone]

## 2020-11-26 ENCOUNTER — Encounter: Payer: Self-pay | Admitting: Internal Medicine

## 2020-11-26 ENCOUNTER — Ambulatory Visit (INDEPENDENT_AMBULATORY_CARE_PROVIDER_SITE_OTHER): Payer: Self-pay | Admitting: Internal Medicine

## 2020-11-26 ENCOUNTER — Other Ambulatory Visit: Payer: Self-pay | Admitting: Internal Medicine

## 2020-11-26 VITALS — BP 170/102 | HR 98 | Temp 98.1°F | Ht 62.0 in | Wt 146.3 lb

## 2020-11-26 DIAGNOSIS — I10 Essential (primary) hypertension: Secondary | ICD-10-CM

## 2020-11-26 DIAGNOSIS — M5412 Radiculopathy, cervical region: Secondary | ICD-10-CM

## 2020-11-26 MED ORDER — CYCLOBENZAPRINE HCL 5 MG PO TABS: 5.0000 mg | ORAL_TABLET | Freq: Three times a day (TID) | ORAL | 1 refills | Status: DC | PRN

## 2020-11-26 MED ORDER — VALSARTAN 160 MG PO TABS: 160.0000 mg | ORAL_TABLET | Freq: Every day | ORAL | 11 refills | Status: DC

## 2020-11-26 MED ORDER — DULOXETINE HCL 60 MG PO CPEP: 60.0000 mg | ORAL_CAPSULE | Freq: Every day | ORAL | 3 refills | Status: DC

## 2020-11-26 NOTE — Assessment & Plan Note (Signed)
Cervical radiculopathy: Eric Ingram unfortunately has been suffering from neck pain dating back to September 2020 and has previously been managed with gabapentin, Tylenol.  Due to being uninsured and financial barriers, he recently had an MRI performed in October 2021 and the results are below:  MRI cervical spine 1. Multilevel spondylosis of the cervical spine as described. 2. Central canal stenosis is greatest at C5-6 where there is effacement of the ventral CSF and slight flattening of the ventral surface of the cord but no abnormal cord signal. 3. Mild central canal narrowing at C4-5 and C6-7. 4. Moderate right foraminal stenosis at C3-4 and C4-5. 5. Severe foraminal narrowing bilaterally at C5-6 is worse on the left. 6. Moderate bilateral foraminal narrowing at C6-7. 7. Moderate left and mild right foraminal stenosis at C7-T1.  He was evaluated by Dr. Truman Hayward in October where he continued to complain of similar symptoms and during that time his gabapentin was increased to 900 mg 3 times a day and he was started on duloxetine.  He was also asked to follow-up with neurosurgery either at [facility] or [facility] through charity care.  Today, he continues to endorse 9-10/10 upper back and neck pain that radiates to his shoulders bilaterally as well as his head.  He also reports of numbness and tingling of his left upper extremity as well as weakness.  He states that he cannot really sleep at night and when he lies flat or sleep on his side the pain recurs.  The pain improves during the day.  He denies fevers, chills, urinary or bowel incontinence.  On physical exams, his motor strength was 3/5 at the left upper extremity on flexion and extension.  His grip strength though was unremarkable.  Left lower extremity motor strength was 3/5.  He also had sensory deficits on his left upper extremity.  Assessment and plan: This is a challenging situation as he would need neurosurgery evaluation however financial barriers  remain a huge blockade.  -Increased duloxetine to 60 mg daily -Continue gabapentin 900 mg 3 times a day -Given strict return precautions to the ED if he starts to have urinary incontinence, bowel incontinence severe motor weakness.

## 2020-11-26 NOTE — Assessment & Plan Note (Signed)
Hypertension: Elevated in the clinic today partly due to his pain.  BP Readings from Last 3 Encounters:  11/26/20 (!) 170/102  10/11/20 (!) 144/92  09/11/20 140/90   Plan: -Increase valsartan to 160 mg daily -Continue metoprolol 25 mg daily -Return to clinic in 1 month for hypertension follow-up

## 2020-11-26 NOTE — Patient Instructions (Addendum)
[name],  Thanks for seeing me today.  Here my recommendations after our visit.  1.  Increase the duloxetine to 60 mg daily 2.  Increase valsartan to 160 mg daily 3.  Stop the amlodipine 4.  Please take Tylenol for the pain you are having.  If you start having severe pain, weakness, urinary or bowel incontinence, please go to the emergency department or give [place] a call.  Take Care! Dr. Eileen Stanford  Please call the internal medicine center clinic if you have any questions or concerns, we may be able to help and keep you from a long and expensive emergency room wait. Our clinic and after hours phone number is [phone], the best time to call is Monday through Friday 9 am to 4 pm but there is always someone available 24/7 if you have an emergency. If you need medication refills please notify your pharmacy one week in advance and they will send [place] a request.   If you have not gotten the COVID vaccine, I recommend doing so:  You may get it at your local [facility] or [facility] OR To schedule an appointment for a COVID vaccine or be added to the vaccine wait list: Go to [email or web address]   OR Go to [email or web address]                  OR Call [phone]                                     OR Call [phone] and select Option 2

## 2020-11-26 NOTE — Progress Notes (Signed)
   CC: Neck pain/upper back pain  HPI:  [name] is a 59 y.o. with medical history significant for cervical radiculopathy, hypertension presented with worsening neck pain.  Please see problem based charting for further details.  Past Medical History:  Diagnosis Date  . Anemia   . CHF (congestive heart failure) ([place])   . Dental infection 08/02/2018  . GERD (gastroesophageal reflux disease)   . Hypertension   . Shingles rash 07/07/2019  . Thalassemia, alpha ([place])   . Viral gastritis 02/08/2019   Review of Systems:  As per HPI  Physical Exam:  Vitals:   11/26/20 1456 11/26/20 1503  BP:  (!) 170/102  Pulse:  98  Temp:  98.1 F (36.7 C)  TempSrc:  Oral  SpO2:  99%  Weight: 146 lb 4.8 oz (66.4 kg) 146 lb 4.8 oz (66.4 kg)  Height: 5\' 2"  (1.575 m) 5\' 2"  (1.575 m)   Physical Exam Vitals and nursing note reviewed.  Constitutional:      General: Distressed: Moderate distress due to back pain.     Appearance: He is well-developed.  Cardiovascular:     Rate and Rhythm: Normal rate.     Heart sounds: Normal heart sounds.  Pulmonary:     Breath sounds: Normal breath sounds.  Neurological:     Mental Status: He is alert. Mental status is at baseline.     Cranial Nerves: No dysarthria or facial asymmetry.     Sensory: Sensory deficit (Decreased sensation to light touch at the left upper extremity) present.     Motor: Weakness (his motor strength was 3/5 at the left upper extremity on flexion and extension.  His grip strength though was unremarkable.  Left lower extremity motor strength was 3/5. ) present.     Gait: Gait normal.  Psychiatric:        Mood and Affect: Mood normal.        Behavior: Behavior normal.     Assessment & Plan:   See Encounters Tab for problem based charting.  Patient discussed with Dr. Jimmye Norman

## 2020-11-27 NOTE — Progress Notes (Signed)
Internal Medicine Clinic Attending  Case discussed with Dr. Agyei  At the time of the visit.  We reviewed the resident's history and exam and pertinent patient test results.  I agree with the assessment, diagnosis, and plan of care documented in the resident's note.  

## 2020-11-29 ENCOUNTER — Encounter: Payer: Self-pay | Admitting: Pulmonary Disease

## 2020-11-29 ENCOUNTER — Ambulatory Visit: Payer: Self-pay | Admitting: Pulmonary Disease

## 2020-11-29 ENCOUNTER — Other Ambulatory Visit: Payer: Self-pay

## 2020-11-29 ENCOUNTER — Ambulatory Visit (INDEPENDENT_AMBULATORY_CARE_PROVIDER_SITE_OTHER): Payer: Self-pay | Admitting: Pulmonary Disease

## 2020-11-29 VITALS — BP 140/90 | HR 98 | Temp 97.7°F | Ht 62.21 in | Wt 146.4 lb

## 2020-11-29 DIAGNOSIS — M5412 Radiculopathy, cervical region: Secondary | ICD-10-CM

## 2020-11-29 DIAGNOSIS — J8410 Pulmonary fibrosis, unspecified: Secondary | ICD-10-CM

## 2020-11-29 DIAGNOSIS — Z8616 Personal history of COVID-19: Secondary | ICD-10-CM

## 2020-11-29 DIAGNOSIS — J849 Interstitial pulmonary disease, unspecified: Secondary | ICD-10-CM

## 2020-11-29 NOTE — Patient Instructions (Signed)
Thank you for visiting Dr. Valeta Harms at [facility]. Today we recommend the following:  Follow up with primary care and neurosurgery regarding neck and back pain Okay to continue albuterol inhaler as needed  Return if symptoms worsen or fail to improve.    Please do your part to reduce the spread of COVID-19.

## 2020-11-29 NOTE — Progress Notes (Signed)
Synopsis: Referred in May 2021 for history of COVID-19 by Gaylan Gerold, DO  Subjective:   PATIENT ID: Eric Ingram GENDER: male DOB: Mar 01, 1961, MRN: [id number]  Chief Complaint  Patient presents with  . Follow-up    Patient has shortness of breath when laying down,has some back pain and makes it hard for him to breath    This is a 59 year old gentleman past medical history of congestive heart failure, GERD, hypertension.  Patient was diagnosed with COVID-01 March 2020.  He was admitted to [address]  Discharge from the hospital on 02/26/2020 discharge summary reviewed.  For his COVID-19 patient was treated with high flow nasal cannula dexamethasone and remdesivir.  Subsequently he has had multiple emergency department admissions he was readmitted to the hospital at the end of March and discharged March 18, 2020.  At this time still having recurrent respiratory complaints felt to be related to COVID-19.  Once again 5 days later seen again in the emergency department.  03/25/2020 seen by Dr. Sheppard Coil and his primary care office visit.  Last available chest x-ray and April 2021 revealed persistent bilateral patchy infiltrates.  OV 04/22/2020: Patient seen today in the office.  Still complains of significant dyspnea on exertion and shortness of breath.  He feels like he has been very fatigued and losing weight.  Unfortunately has not been very active during this time.  We reviewed his chest imaging today in the office with the patient.  Overall he feels like his trajectory is that he has been getting better but it has been a very slow recovery for him.  He does have some sputum production which is clear.  Patient denies hemoptysis.   Past Medical History:  Diagnosis Date  . Anemia   . CHF (congestive heart failure) ([place])   . Dental infection 08/02/2018  . GERD (gastroesophageal reflux disease)   . Hypertension   . Shingles rash 07/07/2019  . Thalassemia, alpha ([place])   . Viral gastritis 02/08/2019      Family History  Problem Relation Age of Onset  . Colon cancer Neg Hx   . Stomach cancer Neg Hx      Past Surgical History:  Procedure Laterality Date  . NO PAST SURGERIES      Social History   Socioeconomic History  . Marital status: Married    Spouse name: Not on file  . Number of children: 4  . Years of education: Not on file  . Highest education level: Not on file  Occupational History  . Occupation: Unemployed  Tobacco Use  . Smoking status: Former Smoker    Packs/day: 1.00    Years: 5.00    Pack years: 5.00    Types: Cigarettes    Quit date: 12/15/1999    Years since quitting: 20.9  . Smokeless tobacco: Never Used  Vaping Use  . Vaping Use: Never used  Substance and Sexual Activity  . Alcohol use: No    Comment: former  . Drug use: No  . Sexual activity: Not on file  Other Topics Concern  . Not on file  Social History Narrative  . Not on file   Social Determinants of Health   Financial Resource Strain: Not on file  Food Insecurity: Not on file  Transportation Needs: Not on file  Physical Activity: Not on file  Stress: Not on file  Social Connections: Not on file  Intimate Partner Violence: Not on file     No Known Allergies   Outpatient Medications  Prior to Visit  Medication Sig Dispense Refill  . albuterol (VENTOLIN HFA) 108 (90 Base) MCG/ACT inhaler Inhale 1-2 puffs into the lungs every 6 (six) hours as needed for wheezing or shortness of breath. 18 g 0  . atorvastatin (LIPITOR) 80 MG tablet Take 1 tablet (80 mg total) by mouth daily. 30 tablet 11  . budesonide-formoterol (SYMBICORT) 160-4.5 MCG/ACT inhaler Inhale 2 puffs into the lungs 2 (two) times daily. 1 each 12  . cyclobenzaprine (FLEXERIL) 5 MG tablet Take 1 tablet (5 mg total) by mouth 3 (three) times daily as needed for muscle spasms. 30 tablet 1  . DULoxetine (CYMBALTA) 60 MG capsule Take 1 capsule (60 mg total) by mouth daily. 30 capsule 3  . famotidine (PEPCID) 20 MG tablet Take 1  tablet (20 mg total) by mouth 2 (two) times daily for 5 days. 10 tablet 0  . fluticasone (FLONASE) 50 MCG/ACT nasal spray Place 2 sprays into both nostrils daily. 16 g 0  . fluticasone (FLONASE) 50 MCG/ACT nasal spray Place 2 sprays into both nostrils daily.    Marland Kitchen gabapentin (NEURONTIN) 300 MG capsule TAKE 3 CAPSULES BY MOUTH THREE TIMES DAILY. 180 capsule 2  . hydrocortisone cream 1 % Apply topically 2 (two) times daily. 30 g 0  . lidocaine (LIDODERM) 5 % Place 1 patch onto the skin daily. Remove & Discard patch within 12 hours or as directed by MD 30 patch 0  . metoprolol succinate (TOPROL-XL) 25 MG 24 hr tablet Take 1 tablet (25 mg total) by mouth at bedtime. IM Program 30 tablet 3  . naproxen (NAPROSYN) 500 MG tablet Take 1 tablet (500 mg total) by mouth 2 (two) times daily as needed. (Patient taking differently: Take 500 mg by mouth 2 (two) times daily as needed for mild pain.) 60 tablet 2  . pantoprazole (PROTONIX) 40 MG tablet Take 1 tablet (40 mg total) by mouth daily. 30 tablet 3  . sucralfate (CARAFATE) 1 GM/10ML suspension Take 10 mLs (1 g total) by mouth 4 (four) times daily -  with meals and at bedtime. 420 mL 2  . valsartan (DIOVAN) 160 MG tablet Take 1 tablet (160 mg total) by mouth daily. 30 tablet 11   No facility-administered medications prior to visit.    Review of Systems  Constitutional: Negative for chills, fever, malaise/fatigue and weight loss.  HENT: Negative for hearing loss, sore throat and tinnitus.   Eyes: Negative for blurred vision and double vision.  Respiratory: Positive for shortness of breath. Negative for cough, hemoptysis, sputum production, wheezing and stridor.   Cardiovascular: Negative for chest pain, palpitations, orthopnea, leg swelling and PND.  Gastrointestinal: Negative for abdominal pain, constipation, diarrhea, heartburn, nausea and vomiting.  Genitourinary: Negative for dysuria, hematuria and urgency.  Musculoskeletal: Negative for joint pain and  myalgias.  Skin: Negative for itching and rash.  Neurological: Negative for dizziness, tingling, weakness and headaches.  Endo/Heme/Allergies: Negative for environmental allergies. Does not bruise/bleed easily.  Psychiatric/Behavioral: Negative for depression. The patient is not nervous/anxious and does not have insomnia.   All other systems reviewed and are negative.    Objective:  Physical Exam Vitals reviewed.  Constitutional:      General: He is not in acute distress.    Appearance: He is well-developed and well-nourished.  HENT:     Head: Normocephalic and atraumatic.     Mouth/Throat:     Mouth: Oropharynx is clear and moist.  Eyes:     General: No scleral icterus.  Conjunctiva/sclera: Conjunctivae normal.     Pupils: Pupils are equal, round, and reactive to light.  Neck:     Vascular: No JVD.     Trachea: No tracheal deviation.  Cardiovascular:     Rate and Rhythm: Normal rate and regular rhythm.     Pulses: Intact distal pulses.     Heart sounds: Normal heart sounds. No murmur heard.   Pulmonary:     Effort: Pulmonary effort is normal. No tachypnea, accessory muscle usage or respiratory distress.     Breath sounds: Normal breath sounds. No stridor. No wheezing, rhonchi or rales.  Abdominal:     General: Bowel sounds are normal. There is no distension.     Palpations: Abdomen is soft.     Tenderness: There is no abdominal tenderness.  Musculoskeletal:        General: No tenderness or edema.     Cervical back: Neck supple.  Lymphadenopathy:     Cervical: No cervical adenopathy.  Skin:    General: Skin is warm and dry.     Capillary Refill: Capillary refill takes less than 2 seconds.     Findings: No rash.  Neurological:     Mental Status: He is alert and oriented to person, place, and time.  Psychiatric:        Mood and Affect: Mood and affect normal.        Behavior: Behavior normal.      Vitals:   11/29/20 1517  BP: 140/90  Pulse: 98  Temp: 97.7  F (36.5 C)  TempSrc: Temporal  SpO2: 97%  Weight: 146 lb 6.4 oz (66.4 kg)  Height: 5' 2.21" (1.58 m)   97% on RA BMI Readings from Last 3 Encounters:  11/29/20 26.60 kg/m  11/26/20 26.76 kg/m  10/11/20 26.37 kg/m   Wt Readings from Last 3 Encounters:  11/29/20 146 lb 6.4 oz (66.4 kg)  11/26/20 146 lb 4.8 oz (66.4 kg)  10/11/20 144 lb 3.2 oz (65.4 kg)     CBC    Component Value Date/Time   WBC 11.0 (H) 03/25/2020 1345   RBC 5.27 03/25/2020 1345   HGB 11.9 (L) 03/25/2020 1345   HGB 12.2 (L) 09/02/2018 1538   HCT 38.7 (L) 03/25/2020 1345   HCT 39.2 09/02/2018 1538   PLT 505 (H) 03/25/2020 1345   PLT 386 09/02/2018 1538   MCV 73.4 (L) 03/25/2020 1345   MCV 71 (L) 09/02/2018 1538   MCH 22.6 (L) 03/25/2020 1345   MCHC 30.7 03/25/2020 1345   RDW 20.4 (H) 03/25/2020 1345   RDW 16.3 (H) 09/02/2018 1538   LYMPHSABS 2.1 03/25/2020 1345   MONOABS 0.9 03/25/2020 1345   EOSABS 2.5 (H) 03/25/2020 1345   BASOSABS 0.1 03/25/2020 1345    Chest Imaging: April 2021 chest x-ray: Persistent bilateral patchy infiltrates  Pulmonary Functions Testing Results: PFT Results Latest Ref Rng & Units 06/12/2020  FVC-Pre L 1.80  FVC-Predicted Pre % 50  FVC-Post L 1.98  FVC-Predicted Post % 56  Pre FEV1/FVC % % 51  Post FEV1/FCV % % 85  FEV1-Pre L 0.92  FEV1-Predicted Pre % 34  FEV1-Post L 1.68  DLCO uncorrected ml/min/mmHg 12.68  DLCO UNC% % 59  DLCO corrected ml/min/mmHg 12.68  DLCO COR %Predicted % 59  DLVA Predicted % 96  TLC L 3.47  TLC % Predicted % 64  RV % Predicted % 63    FeNO: none   Pathology: none  Echocardiogram: 10/2019 results reviewed  1. Left ventricular  ejection fraction, by visual estimation, is 60 to  65%. The left ventricle has normal function. There is no left ventricular  hypertrophy.  2. Normal GLS -20.  3. Global right ventricle has normal systolic function.The right  ventricular size is normal. No increase in right ventricular wall   thickness.  4. Left atrial size was normal.  5. Right atrial size was normal.  6. The mitral valve is normal in structure. Trace mitral valve  regurgitation. No evidence of mitral stenosis.  7. The tricuspid valve is normal in structure. Tricuspid valve  regurgitation is trivial.  8. The aortic valve is tricuspid. Aortic valve regurgitation is not  visualized. Mild aortic valve sclerosis without stenosis.  9. The pulmonic valve was normal in structure. Pulmonic valve  regurgitation is trivial.  10. The inferior vena cava is normal in size with greater than 50%  respiratory variability, suggesting right atrial pressure of 3 mmHg.   Heart Catheterization: None     Assessment & Plan:     ICD-10-CM   1. History of COVID-19  Z86.16   2. 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 Interstitial pulmonary disease (HCC)  J84.9   4. Cervical radiculopathy  M54.12     Discussion:  This is a 59 year old gentleman history of coke 48 in March 2021 he had subsequent CT scan follow-up of an HRCT that revealed evidence of postinflammatory fibrosis and ILD.  At this point he has no significant respiratory symptoms.  He predominantly complains of cervical neck pains.  It looks like he has been referred to neurosurgery by his primary care provider.  Trying to decipher this whole situation with the translator it seems like he was been unable to get into see them.  Plan: I sent a message to patient's PCP to consider helping coordinate patient to see neurosurgery.  I believe there is a cost barrier from what I can understand through translations with the patient and translator.  As a respiratory standpoint I would let him continue his current inhaler regimen. He can can use albuterol as needed. Patient can follow-up with his primary care provider at this point.  I see no additional need for CT imaging follow-up.  At some point can consider a repeat noncontrasted CT of the chest to see  if there is any worsening or change in the fibrosis but overall patient symptoms have otherwise resolved since his last office visit.    Current Outpatient Medications:  .  albuterol (VENTOLIN HFA) 108 (90 Base) MCG/ACT inhaler, Inhale 1-2 puffs into the lungs every 6 (six) hours as needed for wheezing or shortness of breath., Disp: 18 g, Rfl: 0 .  atorvastatin (LIPITOR) 80 MG tablet, Take 1 tablet (80 mg total) by mouth daily., Disp: 30 tablet, Rfl: 11 .  budesonide-formoterol (SYMBICORT) 160-4.5 MCG/ACT inhaler, Inhale 2 puffs into the lungs 2 (two) times daily., Disp: 1 each, Rfl: 12 .  cyclobenzaprine (FLEXERIL) 5 MG tablet, Take 1 tablet (5 mg total) by mouth 3 (three) times daily as needed for muscle spasms., Disp: 30 tablet, Rfl: 1 .  DULoxetine (CYMBALTA) 60 MG capsule, Take 1 capsule (60 mg total) by mouth daily., Disp: 30 capsule, Rfl: 3 .  famotidine (PEPCID) 20 MG tablet, Take 1 tablet (20 mg total) by mouth 2 (two) times daily for 5 days., Disp: 10 tablet, Rfl: 0 .  fluticasone (FLONASE) 50 MCG/ACT nasal spray, Place 2 sprays into both nostrils daily., Disp: 16 g, Rfl: 0 .  fluticasone (FLONASE)  50 MCG/ACT nasal spray, Place 2 sprays into both nostrils daily., Disp: , Rfl:  .  gabapentin (NEURONTIN) 300 MG capsule, TAKE 3 CAPSULES BY MOUTH THREE TIMES DAILY., Disp: 180 capsule, Rfl: 2 .  hydrocortisone cream 1 %, Apply topically 2 (two) times daily., Disp: 30 g, Rfl: 0 .  lidocaine (LIDODERM) 5 %, Place 1 patch onto the skin daily. Remove & Discard patch within 12 hours or as directed by MD, Disp: 30 patch, Rfl: 0 .  metoprolol succinate (TOPROL-XL) 25 MG 24 hr tablet, Take 1 tablet (25 mg total) by mouth at bedtime. IM Program, Disp: 30 tablet, Rfl: 3 .  naproxen (NAPROSYN) 500 MG tablet, Take 1 tablet (500 mg total) by mouth 2 (two) times daily as needed. (Patient taking differently: Take 500 mg by mouth 2 (two) times daily as needed for mild pain.), Disp: 60 tablet, Rfl: 2 .   pantoprazole (PROTONIX) 40 MG tablet, Take 1 tablet (40 mg total) by mouth daily., Disp: 30 tablet, Rfl: 3 .  sucralfate (CARAFATE) 1 GM/10ML suspension, Take 10 mLs (1 g total) by mouth 4 (four) times daily -  with meals and at bedtime., Disp: 420 mL, Rfl: 2 .  valsartan (DIOVAN) 160 MG tablet, Take 1 tablet (160 mg total) by mouth daily., Disp: 30 tablet, Rfl: 11  I spent 22 minutes dedicated to the care of this patient on the date of this encounter to include pre-visit review of records, face-to-face time with the patient discussing conditions above, post visit ordering of testing, clinical documentation with the electronic health record, making appropriate referrals as documented, and communicating necessary findings to members of the patients care team.    Garner Nash, DO [facility] 11/29/2020 3:25 PM

## 2020-12-11 ENCOUNTER — Other Ambulatory Visit: Payer: Self-pay | Admitting: *Deleted

## 2020-12-11 DIAGNOSIS — I10 Essential (primary) hypertension: Secondary | ICD-10-CM

## 2020-12-11 MED ORDER — METOPROLOL SUCCINATE ER 25 MG PO TB24: 25.0000 mg | ORAL_TABLET | Freq: Every day | ORAL | 3 refills | Status: DC

## 2020-12-24 ENCOUNTER — Ambulatory Visit: Payer: Medicaid Other | Admitting: Internal Medicine

## 2020-12-24 VITALS — BP 163/107 | HR 109 | Temp 98.1°F

## 2020-12-24 DIAGNOSIS — I10 Essential (primary) hypertension: Secondary | ICD-10-CM

## 2020-12-24 NOTE — Progress Notes (Signed)
Entered in error

## 2020-12-25 ENCOUNTER — Ambulatory Visit (INDEPENDENT_AMBULATORY_CARE_PROVIDER_SITE_OTHER): Payer: Medicare Other | Admitting: Internal Medicine

## 2020-12-25 VITALS — BP 133/87 | HR 100 | Temp 97.6°F | Ht 62.0 in | Wt 150.4 lb

## 2020-12-25 DIAGNOSIS — M5412 Radiculopathy, cervical region: Secondary | ICD-10-CM | POA: Diagnosis present

## 2020-12-25 DIAGNOSIS — I10 Essential (primary) hypertension: Secondary | ICD-10-CM | POA: Diagnosis not present

## 2020-12-25 DIAGNOSIS — E782 Mixed hyperlipidemia: Secondary | ICD-10-CM

## 2020-12-25 MED ORDER — METOPROLOL SUCCINATE ER 25 MG PO TB24: 25.0000 mg | ORAL_TABLET | Freq: Every day | ORAL | 3 refills | Status: DC

## 2020-12-25 NOTE — Patient Instructions (Signed)
Thank you, [name] for allowing [place] to provide your care today. Today we discussed neck pain and blood pressure.    I have ordered the following labs for you:  Lab Orders  No laboratory test(s) ordered today     Tests ordered today:  none  Referrals ordered today:    Referral Orders     Ambulatory referral to Neurosurgery   I have ordered the following medication/changed the following medications:   Stop the following medications: Medications Discontinued During This Encounter  Medication Reason  . metoprolol succinate (TOPROL-XL) 25 MG 24 hr tablet Reorder     Start the following medications: Meds ordered this encounter  Medications  . metoprolol succinate (TOPROL-XL) 25 MG 24 hr tablet    Sig: Take 1 tablet (25 mg total) by mouth at bedtime. IM Program    Dispense:  90 tablet    Refill:  3     Follow up: 2-3 months with your PCP    Should you have any questions or concerns please call the internal medicine clinic at [phone].     Marianna Payment, D.O. [place]

## 2020-12-25 NOTE — Progress Notes (Signed)
CC: HTN  HPI:  [name] is a 60 y.o. male with a past medical history stated below and presents today for HTN. Please see problem based assessment and plan for additional details.  Past Medical History:  Diagnosis Date  . Anemia   . CHF (congestive heart failure) ([place])   . Dental infection 08/02/2018  . GERD (gastroesophageal reflux disease)   . Hypertension   . Shingles rash 07/07/2019  . Thalassemia, alpha ([place])   . Viral gastritis 02/08/2019    Current Outpatient Medications on File Prior to Visit  Medication Sig Dispense Refill  . albuterol (VENTOLIN HFA) 108 (90 Base) MCG/ACT inhaler Inhale 1-2 puffs into the lungs every 6 (six) hours as needed for wheezing or shortness of breath. 18 g 0  . atorvastatin (LIPITOR) 80 MG tablet Take 1 tablet (80 mg total) by mouth daily. 30 tablet 11  . budesonide-formoterol (SYMBICORT) 160-4.5 MCG/ACT inhaler Inhale 2 puffs into the lungs 2 (two) times daily. 1 each 12  . cyclobenzaprine (FLEXERIL) 5 MG tablet Take 1 tablet (5 mg total) by mouth 3 (three) times daily as needed for muscle spasms. 30 tablet 1  . DULoxetine (CYMBALTA) 60 MG capsule Take 1 capsule (60 mg total) by mouth daily. 30 capsule 3  . famotidine (PEPCID) 20 MG tablet Take 1 tablet (20 mg total) by mouth 2 (two) times daily for 5 days. 10 tablet 0  . fluticasone (FLONASE) 50 MCG/ACT nasal spray Place 2 sprays into both nostrils daily. 16 g 0  . fluticasone (FLONASE) 50 MCG/ACT nasal spray Place 2 sprays into both nostrils daily.    Marland Kitchen gabapentin (NEURONTIN) 300 MG capsule TAKE 3 CAPSULES BY MOUTH THREE TIMES DAILY. 180 capsule 2  . hydrocortisone cream 1 % Apply topically 2 (two) times daily. 30 g 0  . lidocaine (LIDODERM) 5 % Place 1 patch onto the skin daily. Remove & Discard patch within 12 hours or as directed by MD 30 patch 0  . naproxen (NAPROSYN) 500 MG tablet Take 1 tablet (500 mg total) by mouth 2 (two) times daily as needed. (Patient taking differently: Take 500  mg by mouth 2 (two) times daily as needed for mild pain.) 60 tablet 2  . pantoprazole (PROTONIX) 40 MG tablet Take 1 tablet (40 mg total) by mouth daily. 30 tablet 3  . sucralfate (CARAFATE) 1 GM/10ML suspension Take 10 mLs (1 g total) by mouth 4 (four) times daily -  with meals and at bedtime. 420 mL 2  . valsartan (DIOVAN) 160 MG tablet Take 1 tablet (160 mg total) by mouth daily. 30 tablet 11   No current facility-administered medications on file prior to visit.    Family History  Problem Relation Age of Onset  . Colon cancer Neg Hx   . Stomach cancer Neg Hx     Social History   Socioeconomic History  . Marital status: Married    Spouse name: Not on file  . Number of children: 4  . Years of education: Not on file  . Highest education level: Not on file  Occupational History  . Occupation: Unemployed  Tobacco Use  . Smoking status: Former Smoker    Packs/day: 1.00    Years: 5.00    Pack years: 5.00    Types: Cigarettes    Quit date: 12/15/1999    Years since quitting: 21.0  . Smokeless tobacco: Never Used  Vaping Use  . Vaping Use: Never used  Substance and Sexual Activity  .  Alcohol use: No    Comment: former  . Drug use: No  . Sexual activity: Not on file  Other Topics Concern  . Not on file  Social History Narrative  . Not on file   Social Determinants of Health   Financial Resource Strain: Not on file  Food Insecurity: Not on file  Transportation Needs: Not on file  Physical Activity: Not on file  Stress: Not on file  Social Connections: Not on file  Intimate Partner Violence: Not on file    Review of Systems: ROS negative except for what is noted on the assessment and plan.  Vitals:   12/25/20 1512 12/25/20 1555  BP: (!) 155/98 133/87  Pulse: (!) 111 100  Temp: 97.6 F (36.4 C)   TempSrc: Oral   SpO2: 96%   Weight: 150 lb 6.4 oz (68.2 kg)   Height: 5\' 2"  (1.575 m)      Physical Exam: Physical Exam Constitutional:      Appearance: Normal  appearance.  Cardiovascular:     Rate and Rhythm: Normal rate.     Pulses: Normal pulses.     Heart sounds: Normal heart sounds.  Pulmonary:     Effort: Pulmonary effort is normal.     Breath sounds: Normal breath sounds.  Musculoskeletal:        General: Normal range of motion.     Cervical back: Normal range of motion. Tenderness (pain with motion, but did not limit full rnage of motion) present.  Skin:    General: Skin is warm and dry.  Neurological:     General: No focal deficit present.     Mental Status: He is alert. Mental status is at baseline.     Cranial Nerves: No cranial nerve deficit.     Motor: No weakness.     Coordination: Coordination normal.     Comments: Parethesias of upper extremities      Assessment & Plan:   See Encounters Tab for problem based charting.  Patient discussed with Dr. Kelle Darting, D.O. [facility], PGY-2 Pager: [phone], Phone: [phone] Date 12/27/2020 Time 1:32 PM

## 2020-12-27 ENCOUNTER — Encounter: Payer: Self-pay | Admitting: Internal Medicine

## 2020-12-27 NOTE — Assessment & Plan Note (Signed)
Patient presents today for a follow-up appointment for his hypertension.  His blood pressure was elevated at 170/107 at his last appointment.  At that time his valsartan was increased to 160 mg daily and he was continued on metoprolol 25 mg daily.  Today his blood pressure is 133/87 which is much better.  He states that he has been out of his metoprolol which I will refill today.  I suspect that his blood pressure will return to goal with restarting his metoprolol.  Plan: 1.  Continue valsartan 160 mg daily 2.  Refill metoprolol 25 mg daily

## 2020-12-27 NOTE — Assessment & Plan Note (Signed)
Patient continues to have pain associated with his cervical radiculopathy that is unchanged from his prior appointment and December.  At that time his duloxetine was increased to 60 mg daily and his gabapentin was continued at 900 mg 3 times daily.  He was also referred back to neurosurgery for further evaluation and management.   On evaluation today he continues to have neck and shoulder pain with bilateral upper extremity numbness and tingling.  He said this is not changed since his last appointment.  Patient previously had some upper extremity weakness which I did not appreciate significantly on exam today.  Furthermore his gross sensation was intact in his upper extremities bilaterally.  There were no additional neurologic deficits or signs of cord Impingement.    Plan: -Continue gabapentin and duloxetine -Keep neurosurgery appointment

## 2021-01-01 NOTE — Addendum Note (Signed)
Addended by: Jodean Lima on: 01/01/2021 10:43 AM   Modules accepted: Level of Service

## 2021-01-01 NOTE — Progress Notes (Signed)
Internal Medicine Clinic Attending  Case discussed with Dr. Coe  At the time of the visit.  We reviewed the resident's history and exam and pertinent patient test results.  I agree with the assessment, diagnosis, and plan of care documented in the resident's note.  

## 2021-01-09 ENCOUNTER — Other Ambulatory Visit: Payer: Self-pay

## 2021-01-09 ENCOUNTER — Other Ambulatory Visit: Payer: Self-pay | Admitting: Internal Medicine

## 2021-01-09 DIAGNOSIS — M5412 Radiculopathy, cervical region: Secondary | ICD-10-CM

## 2021-01-09 MED ORDER — GABAPENTIN 300 MG PO CAPS: ORAL_CAPSULE | ORAL | 2 refills | Status: DC

## 2021-01-31 ENCOUNTER — Encounter: Payer: Self-pay | Admitting: Internal Medicine

## 2021-01-31 ENCOUNTER — Ambulatory Visit (HOSPITAL_COMMUNITY)
Admission: RE | Admit: 2021-01-31 | Discharge: 2021-01-31 | Disposition: A | Discharge status: Home / Self Care | Payer: Medicare Other | Source: Ambulatory Visit | Attending: Internal Medicine | Admitting: Internal Medicine

## 2021-01-31 ENCOUNTER — Ambulatory Visit (INDEPENDENT_AMBULATORY_CARE_PROVIDER_SITE_OTHER): Payer: Medicare Other | Admitting: Internal Medicine

## 2021-01-31 VITALS — BP 148/95 | HR 117 | Temp 97.8°F | Ht 62.0 in | Wt 150.7 lb

## 2021-01-31 DIAGNOSIS — R0609 Other forms of dyspnea: Secondary | ICD-10-CM | POA: Insufficient documentation

## 2021-01-31 DIAGNOSIS — R0600 Dyspnea, unspecified: Secondary | ICD-10-CM | POA: Diagnosis not present

## 2021-01-31 DIAGNOSIS — R1031 Right lower quadrant pain: Secondary | ICD-10-CM

## 2021-01-31 DIAGNOSIS — R000 Tachycardia, unspecified: Secondary | ICD-10-CM | POA: Insufficient documentation

## 2021-01-31 DIAGNOSIS — R7989 Other specified abnormal findings of blood chemistry: Secondary | ICD-10-CM | POA: Diagnosis not present

## 2021-01-31 DIAGNOSIS — R1032 Left lower quadrant pain: Secondary | ICD-10-CM

## 2021-01-31 DIAGNOSIS — R040 Epistaxis: Secondary | ICD-10-CM | POA: Diagnosis not present

## 2021-01-31 LAB — TSH: TSH: 0.231 u[IU]/mL — ABNORMAL LOW (ref 0.350–4.500)

## 2021-01-31 LAB — CBC
HCT: 37.4 % — ABNORMAL LOW (ref 39.0–52.0)
Hemoglobin: 12 g/dL — ABNORMAL LOW (ref 13.0–17.0)
MCH: 23.9 pg — ABNORMAL LOW (ref 26.0–34.0)
MCHC: 32.1 g/dL (ref 30.0–36.0)
MCV: 74.4 fL — ABNORMAL LOW (ref 80.0–100.0)
Platelets: 344 10*3/uL (ref 150–400)
RBC: 5.03 MIL/uL (ref 4.22–5.81)
RDW: 15.1 % (ref 11.5–15.5)
WBC: 13 10*3/uL — ABNORMAL HIGH (ref 4.0–10.5)
nRBC: 0.2 % (ref 0.0–0.2)

## 2021-01-31 LAB — TROPONIN I (HIGH SENSITIVITY): Troponin I (High Sensitivity): 5 ng/L (ref <18)

## 2021-01-31 NOTE — Assessment & Plan Note (Signed)
#  Dyspnea on exertion: [name] has a medical history significant for postinflammatory pulmonary fibrosis likely secondary to COVID-19 pneumonia, HFpEF, hypertension, iron deficiency anemia who is here for evaluation of dyspnea on exertion.  He states that he was in his usual state of health until about 2 weeks ago when he began experiencing dyspnea on exertion with associated tiredness. He does not mention of chest pain but he states that at times he feels "something crawling on his chest. 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  He also tells me that he has chronic acid reflux with burning epigastric sensation which worsens when he bends over. He had an EGD performed in 2019 which showed erosive gastritis, negative for H. pylori and was started on a PPI. He denies odynophagia and dysphagia.  EKG performed in clinic today showed sinus tachycardia with mild inversion of T waves in aVF and lead III.  Assessment and plan: Differential diagnosis includes sequelae of postinflammatory pulmonary fibrosis due to COVID-19 pneumonia, less likely ACS, PE, pneumothorax as his vitals are unremarkable except for chronic tachycardia and physical exams unremarkable. Due to his history of chronic anemia, I wonder if this could represent symptomatic anemia. I do not believe this is HFpEF exacerbation as he remains euvolemic.  Plan: -Follow-up TSH, CBC, troponin -Refer to cardiology for ambulatory stress test -Advised patient to seek emergent care when symptoms worsen.

## 2021-01-31 NOTE — Assessment & Plan Note (Signed)
#  Lower abdominal pressure: He states that for the past month he has experienced lower abdominal pressure, dribbling, and difficulty emptying his bladder.   I did a rectal exam and was unable to palpate his prostate.       Postvoid residual: 62 cc. Less likely BPH

## 2021-01-31 NOTE — Patient Instructions (Signed)
Mr. Repass,  Thanks for seeing me today. I am going to order blood work for your shortness of breath. I will give you a call with the results. I will also send you to the heart doctor for your chest pain.  Take care! Dr. Eileen Stanford  Please call the internal medicine center clinic if you have any questions or concerns, we may be able to help and keep you from a long and expensive emergency room wait. Our clinic and after hours phone number is [phone], the best time to call is Monday through Friday 9 am to 4 pm but there is always someone available 24/7 if you have an emergency. If you need medication refills please notify your pharmacy one week in advance and they will send [place] a request.   If you have not gotten the COVID vaccine, I recommend doing so:  You may get it at your local [facility] or [facility] OR To schedule an appointment for a COVID vaccine or be added to the vaccine wait list: Go to [email or web address]   OR Go to [email or web address]                  OR Call [phone]                                     OR Call [phone] and select Option 2

## 2021-01-31 NOTE — Progress Notes (Signed)
   CC: Dyspnea on exertion  HPI:  [name] is a 60 y.o. with medical history listed below presenting with complaints of dyspnea on exertion.  Please see problem based charting for further details.  Past Medical History:  Diagnosis Date  . Anemia   . CHF (congestive heart failure) ([place])   . Dental infection 08/02/2018  . GERD (gastroesophageal reflux disease)   . Hypertension   . Shingles rash 07/07/2019  . Thalassemia, alpha ([place])   . Viral gastritis 02/08/2019   Review of Systems:  As per HPI  Physical Exam:  Vitals:   01/31/21 1025  BP: (!) 148/95  Pulse: (!) 117  Temp: 97.8 F (36.6 C)  TempSrc: Oral  SpO2: 97%  Weight: 150 lb 11.2 oz (68.4 kg)  Height: 5\' 2"  (1.575 m)   Physical Exam Vitals and nursing note reviewed.  Constitutional:      Appearance: Normal appearance.  Cardiovascular:     Rate and Rhythm: Tachycardia present.     Heart sounds: Normal heart sounds.  Pulmonary:     Breath sounds: Normal breath sounds.  Abdominal:     General: Bowel sounds are normal. There is no distension.     Tenderness: Tenderness: Mild bilateral lower abdominal discomfort.  Genitourinary:    Prostate: Normal.     Rectum: Normal. No mass, tenderness, anal fissure or external hemorrhoid. Normal anal tone.  Musculoskeletal:        General: No swelling.     Right lower leg: No edema.     Left lower leg: No edema.  Skin:    General: Skin is warm.  Neurological:     Mental Status: He is alert.  Psychiatric:        Mood and Affect: Mood normal.        Behavior: Behavior normal.     Assessment & Plan:   See Encounters Tab for problem based charting.  Patient discussed with Dr. Dareen Piano

## 2021-01-31 NOTE — Assessment & Plan Note (Signed)
#  Epistaxis: He states that about 3 weeks ago, he had an isolated episode of epistasis from his right nostril which lasted about 30 minutes and has since had no recurrence.

## 2021-01-31 NOTE — Addendum Note (Signed)
Addended by: Jean Rosenthal on: 01/31/2021 11:54 AM   Modules accepted: Orders

## 2021-02-03 ENCOUNTER — Other Ambulatory Visit: Payer: Self-pay | Admitting: Internal Medicine

## 2021-02-03 DIAGNOSIS — R7989 Other specified abnormal findings of blood chemistry: Secondary | ICD-10-CM

## 2021-02-03 NOTE — Progress Notes (Signed)
Cardiology Clinic Note   Patient Name: Eric Ingram Date of Encounter: 02/04/2021  Primary Care Provider:  Gaylan Gerold, DO Primary Cardiologist:  Donato Heinz, MD  Patient Profile     Eric Ingram 60 year old male presents to the clinic today for follow-up evaluation of his essential hypertension and chest pain.  Past Medical History    Past Medical History:  Diagnosis Date  . Anemia   . CHF (congestive heart failure) ([place])   . Dental infection 08/02/2018  . GERD (gastroesophageal reflux disease)   . Hypertension   . Shingles rash 07/07/2019  . Thalassemia, alpha ([place])   . Viral gastritis 02/08/2019   Past Surgical History:  Procedure Laterality Date  . NO PAST SURGERIES      Allergies  No Known Allergies  History of Present Illness    Mr.Lirette has a PMH of hypertension, alpha thalassemia, and precordial chest pain.  He was referred by Dr.Guilloud for evaluation of his chest pain and EKG abnormalities.  His EKG showed an inferior Q-wave.  He did report some chest pain.  He noted that this would occur every other day or so.  He describes the pain as being in the center of his chest and was a burning type sensation.  He did also note some association increase physical exertion when he would lift objects.  He reported that when he would walk he would get pain in the upper part of his abdomen and lower chest.  He reported this pain was different from the pain that he would get some of his chest.  He also reported some shortness of breath.  He was on lisinopril 40 mg daily for his blood pressure however he failed to take the medication prior to the visit.  He reported he quit smoking 20 years ago.  His echocardiogram 01/30/2018 showed normal LV function, G1 DD, normal RV function, and no significant valvular abnormalities.  He was referred for coronary CTA 11/28/2019.  It showed a calcium score of 0, normal coronary arteries, no evidence of CAD.  Presented to PCP 01/31/2021 with  report of increased dyspnea on exertion.  He presents the clinic today for follow-up evaluation states he notices over the last 2 months he has had more increased breathing with increased activity.  He reports that since 2019 he has had some shortness of breath but over the last 2 months it has gotten worse.  He reports that he does have some relief with his inhalers however, it is very slight.  He reports that he has neck pain that keeps him awake at night.  I instructed him that he may need to see neurology for this.  We also reviewed echocardiogram and that he may need to see pulmonology if nothing was found.  He expressed understanding with the help of monitor further.  I will have him follow-up with Dr. Gardiner Rhyme after the exam.  Today he denies chest pain,lower extremity edema, fatigue, palpitations, melena, hematuria, hemoptysis, diaphoresis, weakness, presyncope, syncope, orthopnea, and PND.   Home Medications    Prior to Admission medications   Medication Sig Start Date End Date Taking? Authorizing Provider  albuterol (VENTOLIN HFA) 108 (90 Base) MCG/ACT inhaler Inhale 1-2 puffs into the lungs every 6 (six) hours as needed for wheezing or shortness of breath. 03/04/20   Joy, Shawn C, PA-C  atorvastatin (LIPITOR) 80 MG tablet Take 1 tablet (80 mg total) by mouth daily. 08/23/20 08/23/21  Gaylan Gerold, DO  budesonide-formoterol [facility]) 160-4.5 MCG/ACT inhaler  Inhale 2 puffs into the lungs 2 (two) times daily. 08/27/20   Gaylan Gerold, DO  cyclobenzaprine (FLEXERIL) 5 MG tablet Take 1 tablet (5 mg total) by mouth 3 (three) times daily as needed for muscle spasms. 11/26/20   Jean Rosenthal, MD  DULoxetine (CYMBALTA) 60 MG capsule Take 1 capsule (60 mg total) by mouth daily. 11/26/20   Jean Rosenthal, MD  famotidine (PEPCID) 20 MG tablet Take 1 tablet (20 mg total) by mouth 2 (two) times daily for 5 days. 03/04/20 03/12/20  Joy, Shawn C, PA-C  fluticasone (FLONASE) 50 MCG/ACT nasal spray Place 2  sprays into both nostrils daily. 10/11/18 03/12/20  Kathi Ludwig, MD  fluticasone (FLONASE) 50 MCG/ACT nasal spray Place 2 sprays into both nostrils daily.    [provider]  gabapentin (NEURONTIN) 300 MG capsule TAKE 3 CAPSULES BY MOUTH THREE TIMES DAILY. 01/09/21   Harvie Heck, MD  hydrocortisone cream 1 % Apply topically 2 (two) times daily. 03/18/20   Velna Ochs, MD  lidocaine (LIDODERM) 5 % Place 1 patch onto the skin daily. Remove & Discard patch within 12 hours or as directed by MD 03/19/20   Velna Ochs, MD  metoprolol succinate (TOPROL-XL) 25 MG 24 hr tablet Take 1 tablet (25 mg total) by mouth at bedtime. IM Program 12/25/20   Marianna Payment, MD  naproxen (NAPROSYN) 500 MG tablet Take 1 tablet (500 mg total) by mouth 2 (two) times daily as needed. Patient taking differently: Take 500 mg by mouth 2 (two) times daily as needed for mild pain. 02/09/20 02/08/21  Al Decant, MD  pantoprazole (PROTONIX) 40 MG tablet Take 1 tablet (40 mg total) by mouth daily. 03/25/20   Earlene Plater, MD  sucralfate (CARAFATE) 1 GM/10ML suspension Take 10 mLs (1 g total) by mouth 4 (four) times daily -  with meals and at bedtime. 08/22/20   Gaylan Gerold, DO  valsartan (DIOVAN) 160 MG tablet Take 1 tablet (160 mg total) by mouth daily. 11/26/20 11/26/21  Jean Rosenthal, MD    Family History    Family History  Problem Relation Age of Onset  . Colon cancer Neg Hx   . Stomach cancer Neg Hx    He indicated that his mother is alive. He indicated that his father is alive. He indicated that the status of his neg hx is unknown.  Social History    Social History   Socioeconomic History  . Marital status: Married    Spouse name: Not on file  . Number of children: 4  . Years of education: Not on file  . Highest education level: Not on file  Occupational History  . Occupation: Unemployed  Tobacco Use  . Smoking status: Former Smoker    Packs/day: 1.00    Years: 5.00    Pack  years: 5.00    Types: Cigarettes    Quit date: 12/15/1999    Years since quitting: 21.1  . Smokeless tobacco: Never Used  Vaping Use  . Vaping Use: Never used  Substance and Sexual Activity  . Alcohol use: No    Comment: former  . Drug use: No  . Sexual activity: Not on file  Other Topics Concern  . Not on file  Social History Narrative  . Not on file   Social Determinants of Health   Financial Resource Strain: Not on file  Food Insecurity: Not on file  Transportation Needs: Not on file  Physical Activity: Not on file  Stress: Not on file  Social Connections: Not on file  Intimate Partner Violence: Not on file     Review of Systems    General:  No chills, fever, night sweats or weight changes.  Cardiovascular:  No chest pain, dyspnea on exertion, edema, orthopnea, palpitations, paroxysmal nocturnal dyspnea. Dermatological: No rash, lesions/masses Respiratory: No cough, dyspnea Urologic: No hematuria, dysuria Abdominal:   No nausea, vomiting, diarrhea, bright red blood per rectum, melena, or hematemesis Neurologic:  No visual changes, wkns, changes in mental status. All other systems reviewed and are otherwise negative except as noted above.  Physical Exam    VS:  BP 116/78   Pulse 93   Ht 5\' 2"  (1.575 m)   Wt 150 lb (68 kg)   SpO2 99%   BMI 27.44 kg/m  , BMI Body mass index is 27.44 kg/m. GEN: Well nourished, well developed, in no acute distress. HEENT: normal. Neck: Supple, no JVD, carotid bruits, or masses. Cardiac: RRR, no murmurs, rubs, or gallops. No clubbing, cyanosis, edema.  Radials/DP/PT 2+ and equal bilaterally.  Respiratory:  Respirations regular and unlabored, clear to auscultation bilaterally. GI: Soft, nontender, nondistended, BS + x 4. MS: no deformity or atrophy. Skin: warm and dry, no rash. Neuro:  Strength and sensation are intact. Psych: Normal affect.  Accessory Clinical Findings    Recent Labs: 03/10/2020: B Natriuretic Peptide  67.0 03/18/2020: Magnesium 2.0 03/25/2020: ALT 31; BUN 6; Creatinine, Ser 0.90; Potassium 4.3; Sodium 137 01/31/2021: Hemoglobin 12.0; Platelets 344; TSH 0.231   Recent Lipid Panel    Component Value Date/Time   CHOL 266 (H) 08/22/2020 1112   TRIG 742 (HH) 08/22/2020 1112   HDL 27 (L) 08/22/2020 1112   CHOLHDL 9.9 (H) 08/22/2020 1112   CHOLHDL 3.9 02/09/2018 0623   VLDL 24 02/09/2018 0623   LDLCALC 108 (H) 08/22/2020 1112    ECG personally reviewed by me today-none today.  Coronary CTA 11/28/2019 IMPRESSION: 1. Coronary calcium score of 0. This was 0 percentile for age and sex matched control.  2. Normal coronary origin with right dominance.  3. No evidence of CAD.  4. Step artifact precludes interpretation of a small branch off of OM1  CAD-RADS 0. No evidence of CAD (0%). Consider non-atherosclerotic causes of chest pain.   Electronically Signed   By: Oswaldo Milian MD   On: 11/28/2019 22:42  Assessment & Plan   1.  Dyspnea on exertion- reports increased work of breathing over the last 2 months.  He has just slight improvement with inhaler use. Continue inhaler use Heart healthy low-sodium diet-salty 6 given Increase physical activity as tolerated Order echocardiogram   Essential hypertension-BP today 116/78.  Well-controlled at home Continue metoprolol, valsartan Heart healthy low-sodium diet-salty 6 given Increase physical activity as tolerated  Precordial chest pain-no chest pain today.  Underwent coronary CTA 11/28/2019 which showed a coronary calcium score of 0 and normal coronary arteries.  Hyperlipidemia-08/22/2020: Cholesterol, Total 266; HDL 27; LDL Chol Calc (NIH) 108; Triglycerides 742 Continue atorvastatin Heart healthy low-sodium high-fiber diet Increase physical activity as tolerated  Disposition: Follow-up with Dr. Gardiner Rhyme after echocardiogram.  Jossie Ng. Ethne Jeon NP-C    02/04/2021, 3:34 PM [place] [address] Office [phone] Fax [phone]  Notice: This dictation was prepared with Dragon dictation along with smaller phrase technology. Any transcriptional errors that result from this process are unintentional and may not be corrected upon review.  I spent 12 minutes examining this patient, reviewing medications, and using patient centered  shared decision making involving her cardiac care.  Prior to her visit I spent greater than 20 minutes reviewing her past medical history,  medications, and prior cardiac tests.

## 2021-02-03 NOTE — Addendum Note (Signed)
Addended by: Truddie Crumble on: 02/03/2021 09:02 AM   Modules accepted: Orders

## 2021-02-03 NOTE — Progress Notes (Signed)
Internal Medicine Clinic Attending  Case discussed with Dr. Agyei  At the time of the visit.  We reviewed the resident's history and exam and pertinent patient test results.  I agree with the assessment, diagnosis, and plan of care documented in the resident's note.  

## 2021-02-03 NOTE — Addendum Note (Signed)
Addended by: Truddie Crumble on: 02/03/2021 09:11 AM   Modules accepted: Orders

## 2021-02-04 ENCOUNTER — Ambulatory Visit (INDEPENDENT_AMBULATORY_CARE_PROVIDER_SITE_OTHER): Payer: Medicaid Other | Admitting: General Practice

## 2021-02-04 ENCOUNTER — Encounter: Payer: Self-pay | Admitting: General Practice

## 2021-02-04 ENCOUNTER — Other Ambulatory Visit: Payer: Self-pay

## 2021-02-04 ENCOUNTER — Other Ambulatory Visit (INDEPENDENT_AMBULATORY_CARE_PROVIDER_SITE_OTHER): Payer: Medicaid Other

## 2021-02-04 VITALS — BP 116/78 | HR 93 | Ht 62.0 in | Wt 150.0 lb

## 2021-02-04 DIAGNOSIS — R072 Precordial pain: Secondary | ICD-10-CM

## 2021-02-04 DIAGNOSIS — R0609 Other forms of dyspnea: Secondary | ICD-10-CM

## 2021-02-04 DIAGNOSIS — I10 Essential (primary) hypertension: Secondary | ICD-10-CM

## 2021-02-04 DIAGNOSIS — R0600 Dyspnea, unspecified: Secondary | ICD-10-CM

## 2021-02-04 DIAGNOSIS — R7989 Other specified abnormal findings of blood chemistry: Secondary | ICD-10-CM

## 2021-02-04 DIAGNOSIS — E782 Mixed hyperlipidemia: Secondary | ICD-10-CM

## 2021-02-04 DIAGNOSIS — R0602 Shortness of breath: Secondary | ICD-10-CM | POA: Diagnosis not present

## 2021-02-04 LAB — T4, FREE: Free T4: 0.79 ng/dL (ref 0.61–1.12)

## 2021-02-04 NOTE — Patient Instructions (Signed)
Medication Instructions:  The current medical regimen is effective;  continue present plan and medications as directed. Please refer to the Current Medication list given to you today.  *If you need a refill on your cardiac medications before your next appointment, please call your pharmacy*  Lab Work:   Testing/Procedures:  NONE    NONE  Special Instructions Echocardiogram - Your physician has requested that you have an echocardiogram. Echocardiography is a painless test that uses sound waves to create images of your heart. It provides your doctor with information about the size and shape of your heart and how well your heart's chambers and valves are working. This procedure takes approximately one hour. There are no restrictions for this procedure. This will be performed at our [facility] location - [address], [address].  PLEASE READ AND FOLLOW SALTY 6-ATTACHED-1,800mg  daily  Follow-Up: Your next appointment:  AFTER ECHO  In Person with Oswaldo Milian, MD OR IF UNAVAILABLE [place], FNP-C  At [facility], you and your health needs are our priority.  As part of our continuing mission to provide you with exceptional heart care, we have created designated Provider Care Teams.  These Care Teams include your primary Cardiologist (physician) and Advanced Practice Providers (APPs -  Physician Assistants and Nurse Practitioners) who all work together to provide you with the care you need, when you need it.  We recommend signing up for the patient portal called "MyChart".  Sign up information is provided on this After Visit Summary.  MyChart is used to connect with patients for Virtual Visits (Telemedicine).  Patients are able to view lab/test results, encounter notes, upcoming appointments, etc.  Non-urgent messages can be sent to your provider as well.   To learn more about what you can do with MyChart, go to [email or web address].              6 SALTY THINGS TO AVOID      1,800MG  DAILY

## 2021-02-05 ENCOUNTER — Other Ambulatory Visit: Payer: Self-pay | Admitting: Internal Medicine

## 2021-02-05 DIAGNOSIS — E0590 Thyrotoxicosis, unspecified without thyrotoxic crisis or storm: Secondary | ICD-10-CM

## 2021-02-05 HISTORY — DX: Thyrotoxicosis, unspecified without thyrotoxic crisis or storm: E05.90

## 2021-02-05 LAB — T3, FREE: T3, Free: 2.1 pg/mL (ref 2.0–4.4)

## 2021-02-05 LAB — THYROTROPIN RECEPTOR AUTOABS: Thyrotropin Receptor Ab: <1.1 IU/L (ref 0.00–1.75)

## 2021-02-09 ENCOUNTER — Other Ambulatory Visit: Payer: Self-pay | Admitting: Internal Medicine

## 2021-02-28 ENCOUNTER — Other Ambulatory Visit: Payer: Self-pay

## 2021-02-28 ENCOUNTER — Ambulatory Visit (HOSPITAL_COMMUNITY): Payer: Medicare Other | Attending: Internal Medicine

## 2021-02-28 ENCOUNTER — Other Ambulatory Visit: Payer: Self-pay | Admitting: Student

## 2021-02-28 DIAGNOSIS — I10 Essential (primary) hypertension: Secondary | ICD-10-CM

## 2021-02-28 DIAGNOSIS — M5412 Radiculopathy, cervical region: Secondary | ICD-10-CM

## 2021-02-28 DIAGNOSIS — R0602 Shortness of breath: Secondary | ICD-10-CM

## 2021-02-28 DIAGNOSIS — R0609 Other forms of dyspnea: Secondary | ICD-10-CM

## 2021-02-28 DIAGNOSIS — R0600 Dyspnea, unspecified: Secondary | ICD-10-CM | POA: Diagnosis not present

## 2021-02-28 LAB — ECHOCARDIOGRAM COMPLETE
Area-P 1/2: 3.74 cm2
S' Lateral: 2.8 cm

## 2021-02-28 MED ORDER — VALSARTAN 160 MG PO TABS: 160.0000 mg | ORAL_TABLET | Freq: Every day | ORAL | 11 refills | Status: DC

## 2021-02-28 MED ORDER — METOPROLOL SUCCINATE ER 25 MG PO TB24: 25.0000 mg | ORAL_TABLET | Freq: Every day | ORAL | 3 refills | Status: DC

## 2021-02-28 MED ORDER — METOPROLOL SUCCINATE ER 25 MG PO TB24
25.0000 mg | ORAL_TABLET | Freq: Every day | ORAL | 3 refills | Status: DC
Start: 2021-02-28 — End: 2021-02-28

## 2021-02-28 MED ORDER — DULOXETINE HCL 60 MG PO CPEP
60.0000 mg | ORAL_CAPSULE | Freq: Every day | ORAL | 3 refills | Status: DC
Start: 2021-02-28 — End: 2021-02-28

## 2021-02-28 MED ORDER — DULOXETINE HCL 60 MG PO CPEP: 60.0000 mg | ORAL_CAPSULE | Freq: Every day | ORAL | 3 refills | Status: DC

## 2021-02-28 NOTE — Telephone Encounter (Signed)
Eric Ingram with Outpatient pharmacy requesting to speak with a nurse about Dr. Eileen Stanford name cannot be process through [facility]. Requesting another provider name.please call back.

## 2021-02-28 NOTE — Telephone Encounter (Signed)
RTC to [facility] pharmacy, spoke with [place].  Dr. Nelia Shi medicaid number won't go through for the RX's he sent.   Will send to attending pool and yellow team to see if MD will please resend. Thanks! Treasure Ingrum

## 2021-02-28 NOTE — Telephone Encounter (Signed)
Stacee, I just sent those in! Thanks!

## 2021-03-07 ENCOUNTER — Encounter: Payer: Self-pay | Admitting: Cardiology

## 2021-03-07 ENCOUNTER — Other Ambulatory Visit: Payer: Self-pay

## 2021-03-07 ENCOUNTER — Ambulatory Visit (INDEPENDENT_AMBULATORY_CARE_PROVIDER_SITE_OTHER): Payer: Medicare Other | Admitting: Cardiology

## 2021-03-07 VITALS — BP 140/80 | HR 78 | Ht 62.0 in | Wt 153.6 lb

## 2021-03-07 DIAGNOSIS — E782 Mixed hyperlipidemia: Secondary | ICD-10-CM

## 2021-03-07 DIAGNOSIS — R072 Precordial pain: Secondary | ICD-10-CM

## 2021-03-07 DIAGNOSIS — I10 Essential (primary) hypertension: Secondary | ICD-10-CM

## 2021-03-07 LAB — COMPREHENSIVE METABOLIC PANEL
ALT: 22 IU/L (ref 0–44)
AST: 25 IU/L (ref 0–40)
Albumin/Globulin Ratio: 1.4 (ref 1.2–2.2)
Albumin: 4.1 g/dL (ref 3.8–4.9)
Alkaline Phosphatase: 73 IU/L (ref 44–121)
BUN/Creatinine Ratio: 12 (ref 9–20)
BUN: 14 mg/dL (ref 6–24)
Bilirubin Total: 0.7 mg/dL (ref 0.0–1.2)
CO2: 24 mmol/L (ref 20–29)
Calcium: 9.2 mg/dL (ref 8.7–10.2)
Chloride: 103 mmol/L (ref 96–106)
Creatinine, Ser: 1.16 mg/dL (ref 0.76–1.27)
Globulin, Total: 3 g/dL (ref 1.5–4.5)
Glucose: 78 mg/dL (ref 65–99)
Potassium: 4 mmol/L (ref 3.5–5.2)
Sodium: 142 mmol/L (ref 134–144)
Total Protein: 7.1 g/dL (ref 6.0–8.5)
eGFR: 73 mL/min/{1.73_m2} (ref >59)

## 2021-03-07 LAB — LIPID PANEL
Chol/HDL Ratio: 4.3 ratio (ref 0.0–5.0)
Cholesterol, Total: 175 mg/dL (ref 100–199)
HDL: 41 mg/dL (ref >39)
LDL Chol Calc (NIH): 100 mg/dL — ABNORMAL HIGH (ref 0–99)
Triglycerides: 195 mg/dL — ABNORMAL HIGH (ref 0–149)
VLDL Cholesterol Cal: 34 mg/dL (ref 5–40)

## 2021-03-07 LAB — SPECIMEN STATUS REPORT

## 2021-03-07 LAB — CBC
Hematocrit: 40.5 % (ref 37.5–51.0)
Hemoglobin: 12.6 g/dL — ABNORMAL LOW (ref 13.0–17.7)
MCH: 23.9 pg — ABNORMAL LOW (ref 26.6–33.0)
MCHC: 31.1 g/dL — ABNORMAL LOW (ref 31.5–35.7)
MCV: 77 fL — ABNORMAL LOW (ref 79–97)
Platelets: 357 10*3/uL (ref 150–450)
RBC: 5.27 x10E6/uL (ref 4.14–5.80)
RDW: 14.4 % (ref 11.6–15.4)
WBC: 11.1 10*3/uL — ABNORMAL HIGH (ref 3.4–10.8)

## 2021-03-07 NOTE — Progress Notes (Signed)
Cardiology Office Note:    Date:  03/09/2021   ID:  Eric Ingram, DOB 07/20/1961, MRN [id number]  PCP:  Gaylan Gerold, DO  Cardiologist:  Donato Heinz, MD  Electrophysiologist:  None   Referring MD: Gaylan Gerold, DO   Chief Complaint  Patient presents with   Chest Pain   History of Present Illness:    Eric Ingram is a 60 y.o. male with a hx of hypertension, alpha thalassemia who is referred by Dr. Philipp Ovens for evaluation of EKG abnormalities and chest pain.  EKG demonstrated inferior Q waves, as he was referred to cardiology for further evaluation.  He does report chest pain, occurring every other day or so.  Describes pain at center of chest, which she describes as burning pain.  Reports that has noted some association with exertion, particularly when he is lifting something.  He says when he walks will get pain on left upper adbomen/ lower chest, which he describes is different than the pain he gets in the center of his chest.  Also reports some shortness of breath.  He is on lisinopril 40 mg daily for hypertension, which he did not take this morning did not take BP meds this morning.  Quit smoking 20 years ago.  Does not know if family history of heart disease.    He had an echocardiogram in 02/09/2018 which showed normal LV systolic function, grade 1 diastolic dysfunction, normal RV function, and no significant valvular disease.  Coronary CTA on 11/27/2019 showed calcium score 0, normal coronary arteries.  Echocardiogram 02/28/2021 showed normal biventricular function, no significant valvular disease.  Since last clinic visit, denies any chest pain but has been having epigastric pain.  Reports occurs daily, does not note relationship with exertion.  Does report has some shortness of breath with exertion.  Still under a lot of stress as recently his 75 year old son died.  Reports BP has been 100s to 110s at home but reports intermittent lightheadedness with standing.  Denies any  syncope.   Past Medical History:  Diagnosis Date   Anemia    CHF (congestive heart failure) (HCC)    Dental infection 08/02/2018   GERD (gastroesophageal reflux disease)    Hypertension    Shingles rash 07/07/2019   Thalassemia, alpha (HCC)    Viral gastritis 02/08/2019    Past Surgical History:  Procedure Laterality Date   NO PAST SURGERIES      Current Medications: Current Meds  Medication Sig   atorvastatin (LIPITOR) 80 MG tablet Take 1 tablet (80 mg total) by mouth daily.   budesonide-formoterol (SYMBICORT) 160-4.5 MCG/ACT inhaler Inhale 2 puffs into the lungs 2 (two) times daily.   cyclobenzaprine (FLEXERIL) 5 MG tablet Take 5 mg by mouth 3 (three) times daily as needed for muscle spasms.   DULoxetine (CYMBALTA) 60 MG capsule Take 1 capsule (60 mg total) by mouth daily.   gabapentin (NEURONTIN) 300 MG capsule TAKE 3 CAPSULES BY MOUTH THREE TIMES DAILY.   hydrocortisone cream 1 % Apply topically 2 (two) times daily.   metoprolol succinate (TOPROL-XL) 25 MG 24 hr tablet Take 1 tablet (25 mg total) by mouth at bedtime. IM Program   pantoprazole (PROTONIX) 40 MG tablet Take 1 tablet (40 mg total) by mouth daily.   valsartan (DIOVAN) 160 MG tablet Take 1 tablet (160 mg total) by mouth daily.   [DISCONTINUED] albuterol (VENTOLIN HFA) 108 (90 Base) MCG/ACT inhaler Inhale 1-2 puffs into the lungs every 6 (six) hours as needed for wheezing or  shortness of breath.     Allergies:   Patient has no known allergies.   Social History   Socioeconomic History   Marital status: Married    Spouse name: Not on file   Number of children: 4   Years of education: Not on file   Highest education level: Not on file  Occupational History   Occupation: Unemployed  Tobacco Use   Smoking status: Former Smoker    Packs/day: 1.00    Years: 5.00    Pack years: 5.00    Types: Cigarettes    Quit date: 12/15/1999    Years since quitting: 21.2   Smokeless tobacco: Never  Used  Scientific laboratory technician Use: Never used  Substance and Sexual Activity   Alcohol use: No    Comment: former   Drug use: No   Sexual activity: Not on file  Other Topics Concern   Not on file  Social History Narrative   Not on file   Social Determinants of Health   Financial Resource Strain: Not on file  Food Insecurity: Not on file  Transportation Needs: Not on file  Physical Activity: Not on file  Stress: Not on file  Social Connections: Not on file     Family History: The patient's family history is negative for Colon cancer and Stomach cancer.  ROS:   Please see the history of present illness.     All other systems reviewed and are negative.  EKGs/Labs/Other Studies Reviewed:    The following studies were reviewed today:   EKG:  EKG is  Not ordered today.  The ekg ordered previously demonstrates normal sinus rhythm, rate 80, no ST/T abnormalities, Q waves in inferior leads not meeting pathologic criteria  Recent Labs: 03/10/2020: B Natriuretic Peptide 67.0 03/18/2020: Magnesium 2.0 01/31/2021: TSH 0.231 03/07/2021: ALT 22; BUN 14; Creatinine, Ser 1.16; Hemoglobin 12.6; Platelets 357; Potassium 4.0; Sodium 142  Recent Lipid Panel    Component Value Date/Time   CHOL 175 03/07/2021 0000   TRIG 195 (H) 03/07/2021 0000   HDL 41 03/07/2021 0000   CHOLHDL 4.3 03/07/2021 0000   CHOLHDL 3.9 02/09/2018 0623   VLDL 24 02/09/2018 0623   LDLCALC 100 (H) 03/07/2021 0000    Physical Exam:    VS:  BP 140/80    Pulse 78    Ht 5\' 2"  (1.575 m)    Wt 153 lb 9.6 oz (69.7 kg)    SpO2 98%    BMI 28.09 kg/m     Wt Readings from Last 3 Encounters:  03/07/21 153 lb 9.6 oz (69.7 kg)  02/04/21 150 lb (68 kg)  01/31/21 150 lb 11.2 oz (68.4 kg)     GEN:  Well nourished, well developed in no acute distress HEENT: Normal NECK: No JVD LYMPHATICS: No lymphadenopathy CARDIAC: RRR, no murmurs, rubs, gallops RESPIRATORY:  Clear to auscultation without rales, wheezing or rhonchi   ABDOMEN: Soft, non-tender, non-distended MUSCULOSKELETAL:  No edema; you can copy paste that back back-and-forth at the HD aVF 21 2 deformity  SKIN: Warm and dry NEUROLOGIC:  Alert and oriented x 3 PSYCHIATRIC:  Normal affect   ASSESSMENT:    1. Precordial pain   2. Essential hypertension   3. Mixed hyperlipidemia    PLAN:    In order of problems listed above:  Chest pain: Coronary CTA on 11/27/2019 showed calcium score 0, normal coronary arteries.  Echocardiogram 02/28/2021 showed normal biventricular function, no significant valvular disease. -No further cardiac work-up recommended  Hypertension: Continue valsartan 160 mg daily.  Check CMP  Hyperlipidemia: On atorvastatin 80 mg daily, LDL 108 on 08/22/2020.  Will recheck lipid panel.  RTC in 1 year   Medication Adjustments/Labs and Tests Ordered: Current medicines are reviewed at length with the patient today.  Concerns regarding medicines are outlined above.  Orders Placed This Encounter  Procedures   Comprehensive metabolic panel   CBC   Lipid panel   Specimen status report   No orders of the defined types were placed in this encounter.   Patient Instructions  Medication Instructions:  Your physician recommends that you continue on your current medications as directed. Please refer to the Current Medication list given to you today.  *If you need a refill on your cardiac medications before your next appointment, please call your pharmacy*   Lab Work: CMET, CBC, Lipid today  If you have labs (blood work) drawn today and your tests are completely normal, you will receive your results only by:  [place] (if you have MyChart) OR  A paper copy in the mail If you have any lab test that is abnormal or we need to change your treatment, we will call you to review the results.  Follow-Up: At [facility], you and your health needs are our priority.  As part of our continuing mission to provide you with  exceptional heart care, we have created designated Provider Care Teams.  These Care Teams include your primary Cardiologist (physician) and Advanced Practice Providers (APPs -  Physician Assistants and Nurse Practitioners) who all work together to provide you with the care you need, when you need it.  We recommend signing up for the patient portal called "MyChart".  Sign up information is provided on this After Visit Summary.  MyChart is used to connect with patients for Virtual Visits (Telemedicine).  Patients are able to view lab/test results, encounter notes, upcoming appointments, etc.  Non-urgent messages can be sent to your provider as well.   To learn more about what you can do with MyChart, go to [email or web address].    Your next appointment:   12 month(s)  The format for your next appointment:   In Person  Provider:   Oswaldo Milian, MD      Signed, Donato Heinz, MD  03/09/2021 11:16 PM    [place]

## 2021-03-07 NOTE — Patient Instructions (Signed)
Medication Instructions:  Your physician recommends that you continue on your current medications as directed. Please refer to the Current Medication list given to you today.  *If you need a refill on your cardiac medications before your next appointment, please call your pharmacy*   Lab Work: CMET, CBC, Lipid today  If you have labs (blood work) drawn today and your tests are completely normal, you will receive your results only by: [facility] MyChart Message (if you have MyChart) OR . A paper copy in the mail If you have any lab test that is abnormal or we need to change your treatment, we will call you to review the results.  Follow-Up: At [facility], you and your health needs are our priority.  As part of our continuing mission to provide you with exceptional heart care, we have created designated Provider Care Teams.  These Care Teams include your primary Cardiologist (physician) and Advanced Practice Providers (APPs -  Physician Assistants and Nurse Practitioners) who all work together to provide you with the care you need, when you need it.  We recommend signing up for the patient portal called "MyChart".  Sign up information is provided on this After Visit Summary.  MyChart is used to connect with patients for Virtual Visits (Telemedicine).  Patients are able to view lab/test results, encounter notes, upcoming appointments, etc.  Non-urgent messages can be sent to your provider as well.   To learn more about what you can do with MyChart, go to [email or web address].    Your next appointment:   12 month(s)  The format for your next appointment:   In Person  Provider:   Oswaldo Milian, MD

## 2021-03-10 ENCOUNTER — Other Ambulatory Visit: Payer: Self-pay | Admitting: Student

## 2021-03-10 DIAGNOSIS — E0590 Thyrotoxicosis, unspecified without thyrotoxic crisis or storm: Secondary | ICD-10-CM

## 2021-03-10 DIAGNOSIS — J4540 Moderate persistent asthma, uncomplicated: Secondary | ICD-10-CM

## 2021-03-10 NOTE — Progress Notes (Signed)
His recent blood work consistent with subclinical hyperthyroidism.  We will repeat TSH

## 2021-03-12 ENCOUNTER — Other Ambulatory Visit: Payer: Self-pay | Admitting: Internal Medicine

## 2021-03-12 MED ORDER — BUDESONIDE-FORMOTEROL FUMARATE 160-4.5 MCG/ACT IN AERO: 2.0000 | INHALATION_SPRAY | Freq: Two times a day (BID) | RESPIRATORY_TRACT | 12 refills | Status: DC

## 2021-03-12 NOTE — Addendum Note (Signed)
Addended by: Jodean Lima on: 03/12/2021 01:51 PM   Modules accepted: Orders

## 2021-03-14 ENCOUNTER — Other Ambulatory Visit: Payer: Self-pay | Admitting: *Deleted

## 2021-03-14 ENCOUNTER — Other Ambulatory Visit: Payer: Self-pay | Admitting: Internal Medicine

## 2021-03-14 DIAGNOSIS — K219 Gastro-esophageal reflux disease without esophagitis: Secondary | ICD-10-CM

## 2021-03-14 MED ORDER — PANTOPRAZOLE SODIUM 40 MG PO TBEC: 40.0000 mg | DELAYED_RELEASE_TABLET | Freq: Every day | ORAL | 3 refills | Status: DC

## 2021-03-15 ENCOUNTER — Other Ambulatory Visit (HOSPITAL_COMMUNITY): Payer: Self-pay

## 2021-03-19 NOTE — Congregational Nurse Program (Signed)
Home visit with interpreter Diu Hartshorn.  Patient called interpreter yesterday to state he was not feeling well.  Complaining of problems with reflux and cervical back pain.  Patient recently lost 35 yr. old son on 02/28/2021 to homicide.  BP today 140/90, pulse 100 and SPO2 97.  States he already called [facility] clinic and scheduled appointment for tomorrow at 11:00 am.  Jake Michaelis RN, Congregational Nurse [phone]

## 2021-03-20 ENCOUNTER — Ambulatory Visit (INDEPENDENT_AMBULATORY_CARE_PROVIDER_SITE_OTHER): Payer: Medicare Other | Admitting: Internal Medicine

## 2021-03-20 ENCOUNTER — Encounter: Payer: Self-pay | Admitting: Internal Medicine

## 2021-03-20 ENCOUNTER — Other Ambulatory Visit (HOSPITAL_COMMUNITY): Payer: Self-pay

## 2021-03-20 VITALS — BP 148/95 | HR 106 | Temp 98.4°F | Ht 62.0 in | Wt 148.3 lb

## 2021-03-20 DIAGNOSIS — E782 Mixed hyperlipidemia: Secondary | ICD-10-CM | POA: Diagnosis not present

## 2021-03-20 DIAGNOSIS — D560 Alpha thalassemia: Secondary | ICD-10-CM

## 2021-03-20 DIAGNOSIS — D72829 Elevated white blood cell count, unspecified: Secondary | ICD-10-CM | POA: Diagnosis present

## 2021-03-20 DIAGNOSIS — J45909 Unspecified asthma, uncomplicated: Secondary | ICD-10-CM

## 2021-03-20 DIAGNOSIS — D72119 Hypereosinophilic syndrome (hes), unspecified: Secondary | ICD-10-CM

## 2021-03-20 DIAGNOSIS — M5412 Radiculopathy, cervical region: Secondary | ICD-10-CM

## 2021-03-20 DIAGNOSIS — R109 Unspecified abdominal pain: Secondary | ICD-10-CM

## 2021-03-20 DIAGNOSIS — K219 Gastro-esophageal reflux disease without esophagitis: Secondary | ICD-10-CM

## 2021-03-20 LAB — CBC WITH DIFFERENTIAL/PLATELET
Abs Immature Granulocytes: 0.2 10*3/uL — ABNORMAL HIGH (ref 0.00–0.07)
Basophils Absolute: 0 10*3/uL (ref 0.0–0.1)
Basophils Relative: 0 %
Eosinophils Absolute: 0 10*3/uL (ref 0.0–0.5)
Eosinophils Relative: 0 %
HCT: 38.3 % — ABNORMAL LOW (ref 39.0–52.0)
Hemoglobin: 12.7 g/dL — ABNORMAL LOW (ref 13.0–17.0)
Lymphocytes Relative: 6 %
Lymphs Abs: 1 10*3/uL (ref 0.7–4.0)
MCH: 24.8 pg — ABNORMAL LOW (ref 26.0–34.0)
MCHC: 33.2 g/dL (ref 30.0–36.0)
MCV: 74.8 fL — ABNORMAL LOW (ref 80.0–100.0)
Metamyelocytes Relative: 1 %
Monocytes Absolute: 0.5 10*3/uL (ref 0.1–1.0)
Monocytes Relative: 3 %
Neutro Abs: 15.4 10*3/uL — ABNORMAL HIGH (ref 1.7–7.7)
Neutrophils Relative %: 90 %
Platelets: 538 10*3/uL — ABNORMAL HIGH (ref 150–400)
RBC: 5.12 MIL/uL (ref 4.22–5.81)
RDW: 13.9 % (ref 11.5–15.5)
WBC: 17.1 10*3/uL — ABNORMAL HIGH (ref 4.0–10.5)
nRBC: 0.2 % (ref 0.0–0.2)
nRBC: 2 /100 WBC — ABNORMAL HIGH

## 2021-03-20 MED ORDER — ATORVASTATIN CALCIUM 80 MG PO TABS
80.0000 mg | ORAL_TABLET | Freq: Every day | ORAL | 3 refills | Status: DC
  Filled 2021-03-20 – 2021-04-11 (×2): qty 90, 90d supply, fill #0
  Filled 2021-07-11: qty 90, 90d supply, fill #1

## 2021-03-20 MED ORDER — PANTOPRAZOLE SODIUM 40 MG PO TBEC
40.0000 mg | DELAYED_RELEASE_TABLET | Freq: Every day | ORAL | 3 refills | Status: DC
  Filled 2021-03-20: qty 90, 90d supply, fill #0
  Filled 2021-06-09: qty 90, 90d supply, fill #1

## 2021-03-20 NOTE — Assessment & Plan Note (Addendum)
CBC Latest Ref Rng & Units 03/07/2021 01/31/2021 03/25/2020  WBC 3.4 - 10.8 x10E3/uL 11.1(H) 13.0(H) 11.0(H)  Hemoglobin 13.0 - 17.7 g/dL 12.6(L) 12.0(L) 11.9(L)  Hematocrit 37.5 - 51.0 % 40.5 37.4(L) 38.7(L)  Platelets 150 - 450 x10E3/uL 357 344 505(H)   Noted to have persistent mild leukocytosis since 12 months prior. Previous differential 12 months prior show eosinophil predominance. Suspected to be associated with asthma and inhaler use in the past. Unusual for leukocytosis to persist. Will need further work-up.  - Cbc with differential - Pathology smear  Addendum: Repeat cbc with neutorphil prominent leukocytosis, pathology smear with changes consistent with hgb H thalassemia as well as neutrophilia and thrombophilia. Could be medication induced in setting of inhaler steroids vs underlying infection. Will advise patient regarding signs of infection and repeat cbc at next clinic visit.

## 2021-03-20 NOTE — Progress Notes (Signed)
CC: Abdominal pain  HPI: [name] is a 60 y.o. with PMH listed below presenting with complaint of abdominal pain. Please see problem based assessment and plan for further details.  Past Medical History:  Diagnosis Date  . Anemia   . CHF (congestive heart failure) ([place])   . Dental infection 08/02/2018  . GERD (gastroesophageal reflux disease)   . Hypertension   . Shingles rash 07/07/2019  . Thalassemia, alpha ([place])   . Viral gastritis 02/08/2019   Review of Systems: Review of Systems  Constitutional: Negative for chills, fever and malaise/fatigue.  Eyes: Negative for blurred vision.  Cardiovascular: Negative for chest pain, palpitations and leg swelling.  Gastrointestinal: Positive for abdominal pain and heartburn. Negative for blood in stool, constipation, diarrhea, nausea and vomiting.  Musculoskeletal: Positive for back pain, joint pain and neck pain.  All other systems reviewed and are negative.   Physical Exam: Vitals:   03/20/21 1105  BP: (!) 148/95  Pulse: (!) 106  Temp: 98.4 F (36.9 C)  TempSrc: Oral  SpO2: 98%  Weight: 148 lb 4.8 oz (67.3 kg)  Height: 5\' 2"  (1.575 m)   Gen: Well-developed, well nourished, NAD HEENT: NCAT head, hearing intact CV: RRR, S1, S2 normal Pulm: CTAB, No rales, no wheezes Abd: BS+, NTND Extm: ROM intact, Peripheral pulses intact, No peripheral edema Skin: Dry, Warm, normal turgor, no wounds, no rashes, no lesions Neuro: No focal neurological deficits. Strength 5/5 bilateral upper extremities  Assessment & Plan:   Hyperlipidemia Pt requires refills on medications with associated diagnosis above.  Reviewed disease process and find this medication to be necessary, will not change dose or alter current therapy.   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 Has hx of homozygous hgb H thalassemia. Previous iron panels w/o iron deficiency.  - C/w monitor  Leukocytosis CBC Latest Ref  Rng & Units 03/07/2021 01/31/2021 03/25/2020  WBC 3.4 - 10.8 x10E3/uL 11.1(H) 13.0(H) 11.0(H)  Hemoglobin 13.0 - 17.7 g/dL 12.6(L) 12.0(L) 11.9(L)  Hematocrit 37.5 - 51.0 % 40.5 37.4(L) 38.7(L)  Platelets 150 - 450 x10E3/uL 357 344 505(H)   Noted to have persistent mild leukocytosis since 12 months prior. Previous differential 12 months prior show eosinophil predominance. Suspected to be associated with asthma and inhaler use in the past. Unusual for leukocytosis to persist. Will need further work-up.  - Cbc with differential - Pathology smear  Cervical radiculopathy Presents with neck and upper back pain. Currently on gabapentin and duloxetine with mild pain relief. Mentions that his neck and shoulder pain is intermittently associated with numbness and tingling. He mentions he is able to continue to function but is in chronic pain. He mentions he was previously referred to neurosurgery as well as sports medicine but became lost to follow up due to lack of insurance.  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 Reviewed MRI with patient and discussed treatment options. Advised on non-pharmaceutical options such as regular neck exercises, heat pads, etc. Also discussed following up with sports medicine first to discuss non-surgical option due to my concerns regarding invasive procedures and follow up due to language barrier and poor health literacy. [name] expressed understanding and agrees with the plan.  - C/w gabapentin, duloxetine - Referral to sports med  Gastro-esophageal reflux [name] is a 60 yo M w/ PMH of Hgbhthalassemia, cervical radiculopathy, subclinical hyperthyroidism presenting to imc with complaint of abdominal pain. He mentions that  this is chronic and he has been having heart burn that radiates upward, associated with food. He mentions that he has not been able to pick up his pantoprazole  and has not taken it for about a month.   A/P Present with GERD. Have been off PPI therapy for 1 month. Advised to start PPI and take daily. Chart review shows EGD in 2020 with negative biopsy for H.pylori. Advised that if pain persists despite PPI therapy, he can re-establish care with GI. - Start pantoprazole 40mg  daily - Provided info on food choices for GERD    Patient discussed with Dr. Rebeca Alert  -Eric Ingram, [facility] Pager: [phone]

## 2021-03-20 NOTE — Assessment & Plan Note (Signed)
Pt requires refills on medications with associated diagnosis above.  Reviewed disease process and find this medication to be necessary, will not change dose or alter current therapy. 

## 2021-03-20 NOTE — Patient Instructions (Addendum)
Dear Mr.Janos Armbrister,  Thank you for allowing [place] to provide your care today. Today we discussed your     I have ordered cbc, a1c labs for you. I will call if any are abnormal.    Today we made no changes to your medications:    Please follow-up in 4 weeks.    Please call the internal medicine center clinic if you have any questions or concerns, we may be able to help and keep you from a long and expensive emergency room wait. Our clinic and after hours phone number is [phone], the best time to call is Monday through Friday 9 am to 4 pm but there is always someone available 24/7 if you have an emergency. If you need medication refills please notify your pharmacy one week in advance and they will send [place] a request.    If you have not gotten the COVID vaccine, I recommend doing so:  You may get it at your local [facility] or [facility] OR To schedule an appointment for a COVID vaccine or be added to the vaccine wait list: Go to [email or web address]   OR Go to [email or web address]                  OR Call [phone]                                     OR Call [phone] and select Option 2  Thank you for choosing [facility]? Cervical Sprain Bong gn c? l tnh tr?ng c?ng ho?c rch m?t ho?c nhi?u dy ch?ng ? c?. Dy ch?ng l cc m n?i cc x??ng. Bong gn c? c th? c m?c ?? t? nh? ??n n?ng. Bong gn c? n?ng c th? khi?n cho x??ng s?ng (??t s?ng) c? khng ?n ??nh. ?i?u ny c th? d?n ??n t?n th??ng t?y s?ng v cc v?n ?? nghim tr?ng ??i v?i h? th?n kinh. Th?i gian ?? tnh tr?ng bong gn c? lnh l?i ty thu?c vo nguyn nhn v m?c ?? t?n th??ng. H?u h?t cc tr??ng h?p bong gn c? kh?i trong vng 4-6 tu?n. Nguyn nhn g gy ra? Bong gn c? c th? do ch?n th??ng, ch?ng h?n nh? t?n th??ng do tai n?n  t, ng, ho?c ng?t ??u v c? ??t ng?t v? pha tr??c v pha sau (t?n th??ng do ng?t c?). Bong gn c? nh? c th? l do mn v rch theo th?i gian. ?i?u g lm t?ng nguy  c?? Nh?ng y?u t? sau c th? khi?n cho qu v? d? b? tnh tr?ng ny h?n:  Tham gia vo cc ho?t ??ng c nguy c? b? ch?n th??ng c? cao. Nh?ng ho?t ??ng ny bao g?m cc mn th? thao ??i khng, ?ua  t, th? d?c d?ng c? v li xe.  G?p r?i ro khi li xe ho?c ?i xe c ??ng c?.  Vim x??ng kh?p ? c?t s?ng.  C? co? s??c b?n va? ?? linh hoa?t ke?m.  T?n th??ng c? tr??c ?.  T? th? km.  Dnh th?i gian di ? m?t t? th? nh?t ??nh gy p l?c ln c?, ch?ng h?n nh? ng?i tr??c my tnh trong th?i gian di. C cc d?u hi?u ho?c tri?u ch?ng g? Nh?ng tri?u ch?ng c?a tnh tr?ng ny bao g?m:  ?au, ?au nh?c, c?ng c?, nh?y c?m ?au, s?ng, ho?c c?m gic ?au rt b?ng ? pha tr??c, pha  sau ho?c hai bn c?, vai ho?c l?ng trn.  Co th?t c? c? ??t ng?t (chu?t rt).  Kh? n?ng c? ??ng c? b? h?n ch?.  ?au ??u.  Chng m?t.  Bu?n nn ho?c nn.  Y?u, t, ho?c ?au bu?t ? m?t bn tay ho?c m?t cnh tay. Cc tri?u ch?ng c th? xu?t hi?n ngay sau khi b? t?n th??ng, ho?c c th? xu?t hi?n trong vi ngy. Trong m?t s? tr??ng h?p, cc tri?u ch?ng c th? h?t khi ?i?u tr? v quay tr? l?i (ti pht) theo th?i gian. Ch?n ?on tnh tr?ng ny nh? th? no? Tnh tr?ng ny c th? ???c ch?n ?on d?a vo:  B?nh s? c?a qu v?.  Tri?u ch?ng c?a qu v?.  B?t k? v?n ?? no ? c? ? bi?t ho?c b?t k? t?n th??ng no g?n ?y m qu v? b?, ch?ng h?n nh? vim kh?p c?.  Khm th?c th?.  Cc ki?m tra hnh ?nh nh? ch?p x-quang, ch?p c?ng h??ng t? (MRI) v ch?p c?t l?p vi tnh (CT). Tnh tr?ng ny ???c ?i?u tr? nh? th? no? Tnh tr?ng ny ???c ?i?u tr? b?ng cch ngh? ng?i v ch??m ? l?nh ln vng b? t?n th??ng v th?c hi?n cc bi t?p v?t l tr? li?u. Li?u php nhi?t c th? ???c s? d?ng 2-3 ngy sau khi x?y ra t?n th??ng n?u khng s?ng. Ty thu?c va?o m?c ?? n?ng c?a b?nh, vi?c ?i?u tr? c?ng c th? bao g?m:  C? ??nh c? (b?t ??ng) trong m?t kho?ng th?i gian. C th? c? ??nh c? b?ng cch s? d?ng: ? N?p c?. N?p ny s? ?? c?m v ph?n sau  ??u c?a qu v?. ? D?ng c? ko c?. ?y l m?t lo?i dy qung gip gi? th?ng ??u qu v?. D?ng c? ny s? lo?i b? tr?ng l??ng v s?c p ln c? qu v? v n c th? gip gi?m ?au.  Thu?c gip gi?m ?au v gi?m vim.  Thu?c gip th? gin c? (thu?c gin c?).  Ph?u thu?t. Tr??ng h?p ny hi?m khi x?y ra. Tun th? nh?ng h??ng d?n ny ? nh: Thu?c  Ch? s? d?ng thu?c khng k ??n v thu?c k ??n theo ch? d?n c?a chuyn gia ch?m [place] s?c kh?e.  Hy h?i chuyn gia ch?m [place] s?c kh?e xem thu?c ???c k ??n cho qu v?: ? Qu v? c c?n ph?i trnh li xe ho?c trnh s? d?ng my mc h?ng n?ng hay khng. ? C th? gy to bn hay khng. Qu v? c th? c?n th?c hi?n cc hnh ??ng ny ?? ng?n ng?a ho?c ?i?u tr? to bn:  U?ng ?? n??c ?? gi? cho n??c ti?u c mu vng nh?t.  Dng thu?c khng k ??n ho?c thu?c k ??n.  ?n th?c ?n giu ch?t x? nh? ??u, ng? c?c nguyn cm, tri cy t??i v rau.  H?n ch? cc lo?i th?c ?n giu ch?t bo v ???ng tinh luy?n, ch?ng h?n nh? ?? ?n chin/rn ho?c ?? ng?t.   N?u qu v? ?eo n?p c?:  Hy ?eo n?p c? theo ch? d?n c?a chuyn gia ch?m [place] s?c kh?e. Khng tho ra tr? khi ???c ch? d?n.  H?i tr??c khi ?i?u ch?nh b?t k? ph?n no c?a vng n?p c?.  N?u tc qu v? di, hy cho tc ra bn ngoi n?p c?.  H?i chuyn gia ch?m [place] s?c kh?e xem qu v? c th? tho n?p c? ?? v? sinh v t?m khng. N?u v?y: ? Tun th? h??ng d?n  v? cch tho n?p m?t cch an ton. ? Lm s?ch n?p b?ng tay v?i x phng d?u nh? v n??c v ?? kh t? nhin hon ton. ? N?u n?p c? c nh?ng mi?ng ??m c th? tho r?i, hy tho nh?ng mi?ng ??m ? ra, 1-2 ngy m?t l?n v dng tay r?a n?p b?ng x phng v n??c. ?? n?p kh t? nhin hon ton tr??c khi n?p c? tr? l?i.  Hy cho chuyn gia ch?m [place] s?c kh?e bi?t n?u da qu v? bn d??i n?p b? kch ?ng ho?c c v?t lot. X? tr ?au, c?ng kh?p v s?ng n?  N?u ???c ch? d?n, hy s? d?ng d?ng c? ko c? theo h??ng d?n.  N?u ???c ch? d?n, hy ch??m ? l?nh ln vng b? ?nh h??ng. ??  lm ?i?u ny: ? Cho ? l?nh vo ti ni lng. ? ?? kh?n t?m ? gi?a da v ti ch??m. ? Ch??m ? l?nh trong 20 pht, 2-3 l?n m?i ngy.  N?u ???c chi? d?n, ha?y ch???m no?ng ln vu?ng bi? a?nh h??ng tr??c khi qu v? lm v?t l tr? li?u ho?c th??ng xuyn theo chi? d?n cu?a chuyn gia ch?m so?c s??c kho?e. S? d?ng ngu?n nhi?t m chuyn gia ch?m [place] s?c kh?e khuy?n ngh?, ch?ng h?n nh? ti ch??m nhi?t ?m ho?c mi?ng ??m ch??m nng. ? ?? kh?n t?m ? gi?a da v ngu?n nhi?t. ? Duy tr ngu?n nhi?t trong 20-30 pht. ? B? ngu?n nhi?t ra n?u da qu v? chuy?n sang mu ?? nh?t. ?i?u ny ??c bi?t quan tr?ng n?u qu v? khng th? c?m th?y ?au, nng, ho?c l?nh. Qu v? c th? c nguy c? b? b?ng cao h?n.      Ho?t ??ng  Khng li xe trong lc ?eo n?p c?. N?u qu v? khng ?eo n?p c?, hy h?i xem li xe trong th?i gian c? qu v? ?ang lnh l?i c an ton hay khng.  Khng nng b?t c? v?t gi? n?ng qu 10 lb (4,5 kg), ho??c qu gi??i h?n m qu v? ???c ch? d?n, cho ??n khi chuyn gia ch?m [place] s?c kh?e c?a qu v? ni r?ng vi?c ? l an ton.  Ngh? ng?i theo ch? d?n c?a chuyn gia ch?m [place] s?c kh?e.  N?u ????c chi? ?i?nh v?t ly? tri? li?u, ha?y t?p cc bi t?p theo chi? d?n cu?a chuyn gia ch?m so?c s??c kho?e ho?c bc s? v?t l tr? li?u.  Tr? l?i sinh ho?t bnh th??ng theo ch? d?n c?a chuyn gia ch?m [place] s?c kh?e. Trnh nh?ng t? th? v ho?t ??ng lm cho cc tri?u ch?ng c?a qu v? tr?m tr?ng h?n. Hy h?i chuyn gia ch?m [place] s?c kh?e v? cc ho?t ??ng no l an ton cho qu v?. H??ng d?n chung  Khng s? d?ng b?t k? s?n ph?m no c nicotine ho?c thu?c l, ch?ng ha?n nh? thu?c l d?ng ht, thu?c l ?i?n t? v thu?c l d?ng nhai. Nh?ng s?n ph?m ny c th? lm ch?m qu trnh lnh v?t th??ng. N?u qu v? c?n gip ?? ?? cai thu?c, hy h?i chuyn gia ch?m [place] s?c kh?e.  Tun th? t?t c? cc l?n khm theo di theo ch? d?n c?a chuyn gia ch?m [place] s?c kh?e ho?c bc s? v?t l tr? li?u. ?i?u ny c vai tr quan tr?ng. Ng?n ng?a  tnh tr?ng ny nh? th? no? ?? ng?n ng?a bong gn c? ti pht:  p d?ng v duy tr t? th? ?ng. Th?c hi?n b?t c? ?i?u ch?nh  no c?n thi?t ? n?i lm vi?c c?a qu v? ?? gip qu v? lm ?i?u ny.  T?p th? d?c th??ng xuyn theo ch? d?n c?a chuyn gia ch?m [place] s?c kh?e ho?c bc s? v?t l tr? li?u.  Trnh cc ho?t ??ng c nguy c? gy bong gn c?. Hy lin l?c v?i chuyn gia ch?m [place] s?c kh?e n?u qu v? c:  Cc tri?u ch?ng tr? ln tr?m tr?ng h?n ho?c khng c?i thi?n sau 2 tu?n ?i?u tr?.  ?au tr?m tr?ng h?n ho??c khng ??? sau khi du?ng thu?c.  Tri?u ch?ng m?i, khng r nguyn nhn.  V?t lot ho?c da kch ?ng trn c? do ?eo n?p c?. Yu c?u tr? gip ngay l?p t?c n?u:  Qu v? b? ?au d? d?i.  Qu v? b? t, ?au nhi, y?u ? b?t c? ph?n no c?a c? th?.  Qu v? khng th? c? ??ng b?t k? ph?n no c?a c? th? (qu v? b? li?t).  Qu v? b? ?au c? km [employer] m?t ho?c ?au ??u d? d?i. Tm t?t  Bong gn c? l tnh tr?ng c?ng ho?c rch m?t ho?c nhi?u dy ch?ng ? c?.  Bong gn c? c th? do ch?n th??ng, ch?ng h?n nh? t?n th??ng do tai n?n  t, ng, ho?c ng?t ??u v c? ??t ng?t v? pha tr??c v pha sau (t?n th??ng do ng?t c?).  Cc tri?u ch?ng c th? xu?t hi?n ngay sau khi b? t?n th??ng, ho?c c th? xu?t hi?n trong vi ngy.  Tnh tr?ng ny c th? ???c ?i?u tr? b?ng cch ngh? ng?i, ch??m ? l?nh, nhi?t, dng thu?c, v?t l tr? li?u v ph?u thu?t. Thng tin ny khng nh?m m?c ?ch thay th? cho l?i khuyn m chuyn gia ch?m [place] s?c kh?e ni v?i qu v?. Hy b?o ??m qu v? ph?i th?o lu?n b?t k? v?n ?? g m qu v? c v?i chuyn gia ch?m [place] s?c kh?e c?a qu v?. Document Revised: 10/24/2019 Document Reviewed: 10/24/2019 Elsevier Patient Education  2021 [employer].

## 2021-03-20 NOTE — Assessment & Plan Note (Addendum)
Presents with neck and upper back pain. Currently on gabapentin and duloxetine with mild pain relief. Mentions that his neck and shoulder pain is intermittently associated with numbness and tingling. He mentions he is able to continue to function but is in chronic pain. He mentions he was previously referred to neurosurgery as well as sports medicine but became lost to follow up due to lack of insurance.  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 Reviewed MRI with patient and discussed treatment options. Advised on non-pharmaceutical options such as regular neck exercises, heat pads, etc. Also discussed following up with sports medicine first to discuss non-surgical option due to my concerns regarding invasive procedures and follow up due to language barrier and poor health literacy. Eric Ingram expressed understanding and agrees with the plan.  - C/w gabapentin, duloxetine - Referral to sports med

## 2021-03-20 NOTE — Assessment & Plan Note (Signed)
Eric Ingram is a 60 yo M w/ PMH of Hgbhthalassemia, cervical radiculopathy, subclinical hyperthyroidism presenting to imc with complaint of abdominal pain. He mentions that this is chronic and he has been having heart burn that radiates upward, associated with food. He mentions that he has not been able to pick up his pantoprazole and has not taken it for about a month.   A/P Present with GERD. Have been off PPI therapy for 1 month. Advised to start PPI and take daily. Chart review shows EGD in 2020 with negative biopsy for H.pylori. Advised that if pain persists despite PPI therapy, he can re-establish care with GI. - Start pantoprazole 40mg  daily - Provided info on food choices for GERD

## 2021-03-20 NOTE — Assessment & Plan Note (Signed)
Persistent microcytic anemia. Has hx of homozygous hgb H thalassemia. Previous iron panels w/o iron deficiency.  - C/w monitor

## 2021-03-21 ENCOUNTER — Other Ambulatory Visit (HOSPITAL_COMMUNITY): Payer: Self-pay

## 2021-03-21 LAB — PATHOLOGIST SMEAR REVIEW

## 2021-03-21 MED FILL — Gabapentin Cap 300 MG: ORAL | 20 days supply | Qty: 180 | Fill #0 | Status: AC

## 2021-03-21 NOTE — Progress Notes (Signed)
Internal Medicine Clinic Attending  Case discussed with Dr. Lee at the time of the visit.  We reviewed the resident's history and exam and pertinent patient test results.  I agree with the assessment, diagnosis, and plan of care documented in the resident's note.  Taliana Mersereau, M.D., Ph.D.  

## 2021-03-24 ENCOUNTER — Other Ambulatory Visit (HOSPITAL_COMMUNITY): Payer: Self-pay

## 2021-03-24 ENCOUNTER — Other Ambulatory Visit: Payer: Self-pay

## 2021-03-27 ENCOUNTER — Encounter: Payer: Self-pay | Admitting: *Deleted

## 2021-04-03 ENCOUNTER — Encounter: Payer: Self-pay | Admitting: *Deleted

## 2021-04-04 ENCOUNTER — Other Ambulatory Visit (HOSPITAL_COMMUNITY): Payer: Self-pay

## 2021-04-04 MED FILL — Duloxetine HCl Enteric Coated Pellets Cap 60 MG (Base Eq): ORAL | 30 days supply | Qty: 30 | Fill #0 | Status: AC

## 2021-04-04 MED FILL — Valsartan Tab 160 MG: ORAL | 30 days supply | Qty: 30 | Fill #0 | Status: AC

## 2021-04-09 NOTE — Congregational Nurse Program (Signed)
CN office visit with interpreter Diu Hartshorn assisting.  Patient bought letter stating he has been approved for Social security disability and payments will begin next month.  We helped him complete application for Medicare Extra Help.  Jake Michaelis RN, Congregational Nurse [phone].

## 2021-04-11 ENCOUNTER — Other Ambulatory Visit (HOSPITAL_COMMUNITY): Payer: Self-pay

## 2021-04-18 ENCOUNTER — Ambulatory Visit (INDEPENDENT_AMBULATORY_CARE_PROVIDER_SITE_OTHER): Payer: Medicare Other | Admitting: Student

## 2021-04-18 ENCOUNTER — Other Ambulatory Visit: Payer: Self-pay

## 2021-04-18 ENCOUNTER — Encounter: Payer: Self-pay | Admitting: Student

## 2021-04-18 ENCOUNTER — Other Ambulatory Visit (HOSPITAL_COMMUNITY): Payer: Self-pay

## 2021-04-18 ENCOUNTER — Ambulatory Visit (HOSPITAL_BASED_OUTPATIENT_CLINIC_OR_DEPARTMENT_OTHER)
Admission: RE | Admit: 2021-04-18 | Discharge: 2021-04-18 | Disposition: A | Discharge status: Home / Self Care | Payer: Medicare Other | Source: Ambulatory Visit | Attending: Student in an Organized Health Care Education/Training Program | Admitting: Student in an Organized Health Care Education/Training Program

## 2021-04-18 VITALS — BP 165/99 | HR 72 | Temp 98.2°F | Ht 62.0 in | Wt 155.1 lb

## 2021-04-18 DIAGNOSIS — M5412 Radiculopathy, cervical region: Secondary | ICD-10-CM

## 2021-04-18 DIAGNOSIS — R220 Localized swelling, mass and lump, head: Secondary | ICD-10-CM

## 2021-04-18 DIAGNOSIS — J8410 Pulmonary fibrosis, unspecified: Secondary | ICD-10-CM | POA: Insufficient documentation

## 2021-04-18 DIAGNOSIS — K921 Melena: Secondary | ICD-10-CM | POA: Diagnosis not present

## 2021-04-18 LAB — CBC WITH DIFFERENTIAL/PLATELET
Abs Immature Granulocytes: 0.18 10*3/uL — ABNORMAL HIGH (ref 0.00–0.07)
Basophils Absolute: 0.1 10*3/uL (ref 0.0–0.1)
Basophils Relative: 1 %
Eosinophils Absolute: 0.4 10*3/uL (ref 0.0–0.5)
Eosinophils Relative: 3 %
HCT: 37.5 % — ABNORMAL LOW (ref 39.0–52.0)
Hemoglobin: 12.2 g/dL — ABNORMAL LOW (ref 13.0–17.0)
Immature Granulocytes: 1 %
Lymphocytes Relative: 26 %
Lymphs Abs: 3.8 10*3/uL (ref 0.7–4.0)
MCH: 24.3 pg — ABNORMAL LOW (ref 26.0–34.0)
MCHC: 32.5 g/dL (ref 30.0–36.0)
MCV: 74.6 fL — ABNORMAL LOW (ref 80.0–100.0)
Monocytes Absolute: 1 10*3/uL (ref 0.1–1.0)
Monocytes Relative: 7 %
Neutro Abs: 9.3 10*3/uL — ABNORMAL HIGH (ref 1.7–7.7)
Neutrophils Relative %: 62 %
Platelets: 427 10*3/uL — ABNORMAL HIGH (ref 150–400)
RBC: 5.03 MIL/uL (ref 4.22–5.81)
RDW: 14.4 % (ref 11.5–15.5)
WBC: 14.8 10*3/uL — ABNORMAL HIGH (ref 4.0–10.5)
nRBC: 0.1 % (ref 0.0–0.2)

## 2021-04-18 LAB — COMPREHENSIVE METABOLIC PANEL
ALT: 36 U/L (ref 0–44)
AST: 30 U/L (ref 15–41)
Albumin: 3.8 g/dL (ref 3.5–5.0)
Alkaline Phosphatase: 62 U/L (ref 38–126)
Anion gap: 7 (ref 5–15)
BUN: 19 mg/dL (ref 6–20)
CO2: 27 mmol/L (ref 22–32)
Calcium: 9.2 mg/dL (ref 8.9–10.3)
Chloride: 104 mmol/L (ref 98–111)
Creatinine, Ser: 1.19 mg/dL (ref 0.61–1.24)
GFR, Estimated: >60 mL/min (ref >60)
Glucose, Bld: 79 mg/dL (ref 70–99)
Potassium: 3.8 mmol/L (ref 3.5–5.1)
Sodium: 138 mmol/L (ref 135–145)
Total Bilirubin: 1.1 mg/dL (ref 0.3–1.2)
Total Protein: 6.7 g/dL (ref 6.5–8.1)

## 2021-04-18 LAB — SEDIMENTATION RATE: Sed Rate: 1 mm/hr (ref 0–16)

## 2021-04-18 LAB — C-REACTIVE PROTEIN: CRP: 0.5 mg/dL (ref <1.0)

## 2021-04-18 IMAGING — CT CT ANGIO CHEST
2 of 7 series · 17 of 46 positions shown · IV contrast (omnipaque)
Comparison: [DATE]

CLINICAL DATA: Idiopathic pulmonary fibrosis. Presenting to clinic
with new facial swelling.

EXAM:
CT ANGIOGRAPHY CHEST WITH CONTRAST
TECHNIQUE: Multidetector CT imaging of the chest was performed using the
standard protocol during bolus administration of intravenous
contrast. Multiplanar CT image reconstructions and MIPs were
obtained to evaluate the vascular anatomy.
CONTRAST:  75mL OMNIPAQUE IOHEXOL 350 MG/ML SOLN

[Series 5: pe axial thins · axial · 0.63mm/px · z∈[+1050,+1231]mm · 14 of 209 slices shown]
[im 14/209  lung]
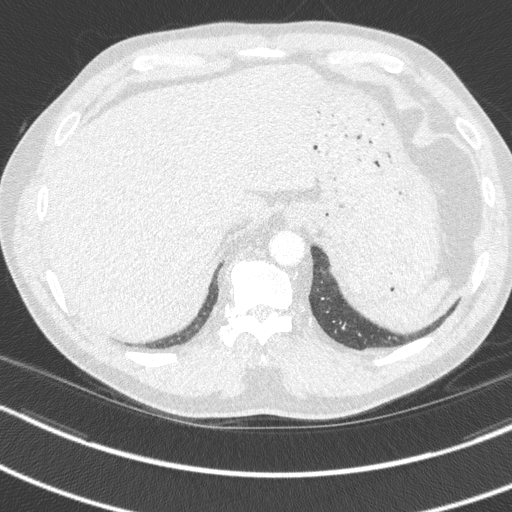
[im 28/209  soft-tissue]
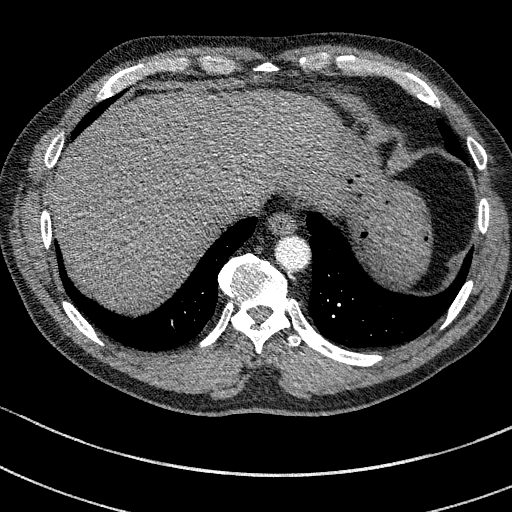
[im 42/209  lung]
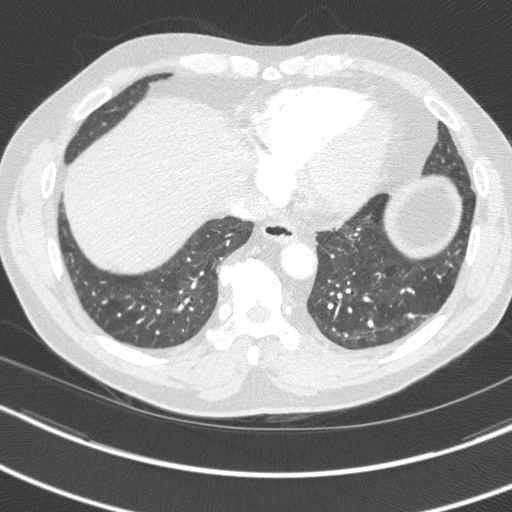
[im 56/209  soft-tissue]
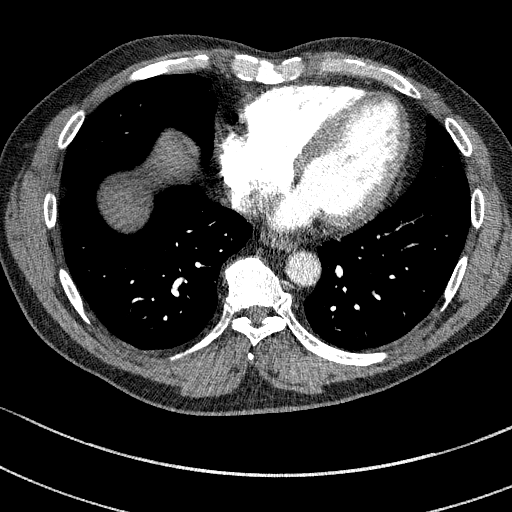
[im 70/209  lung]
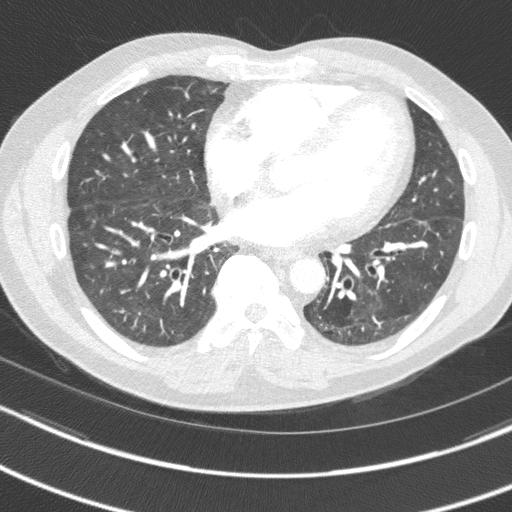
[im 84/209  soft-tissue]
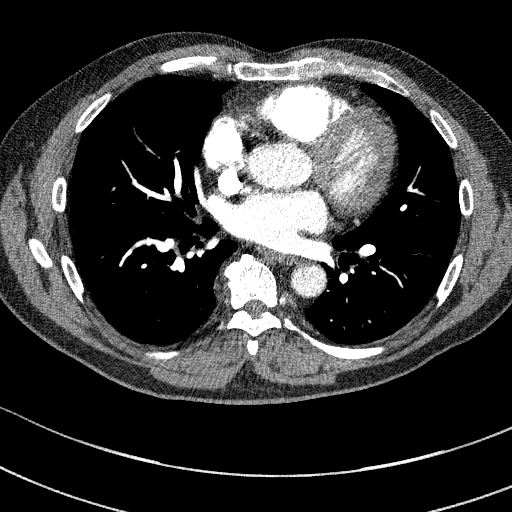
[im 98/209  lung]
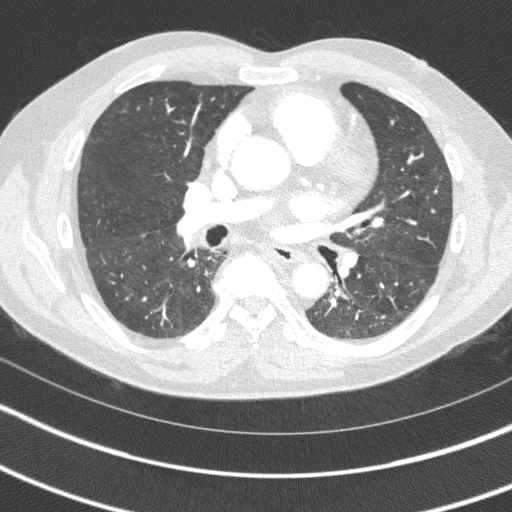
[im 111/209  soft-tissue]
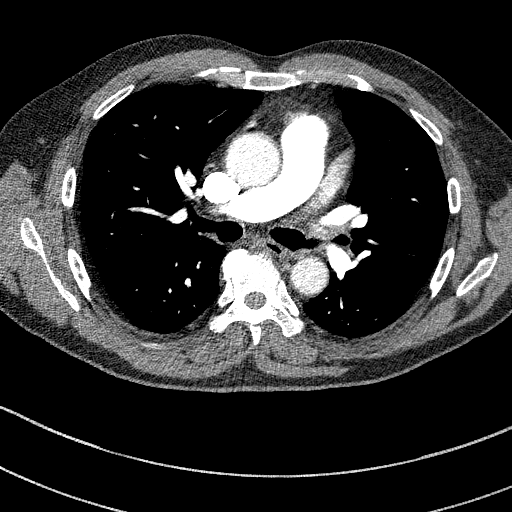
[im 125/209  lung]
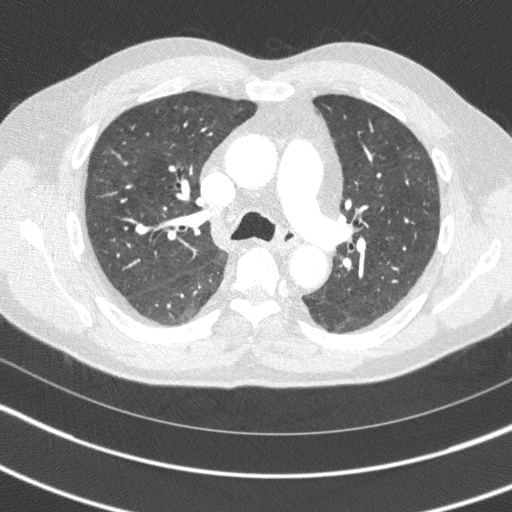
[im 139/209  soft-tissue]
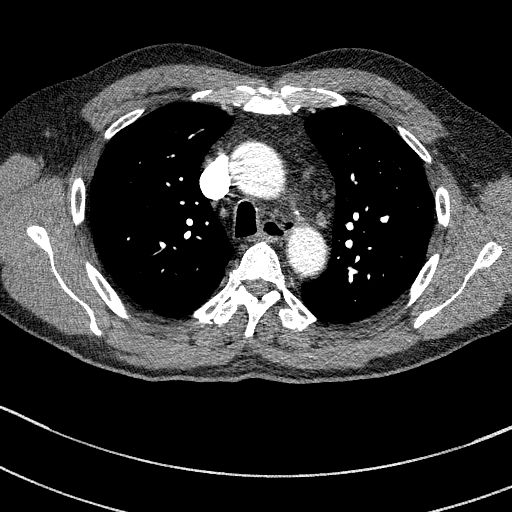
[im 153/209  lung]
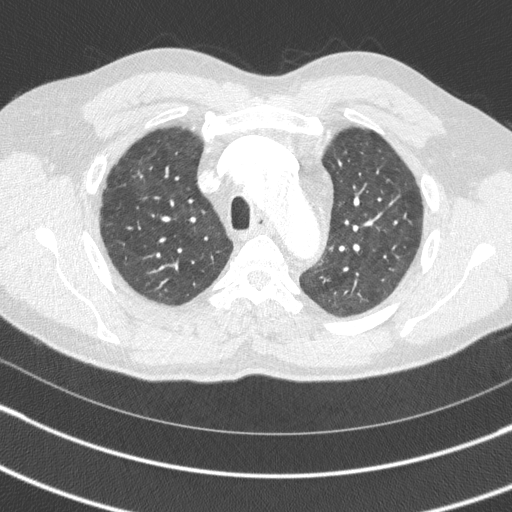
[im 167/209  soft-tissue]
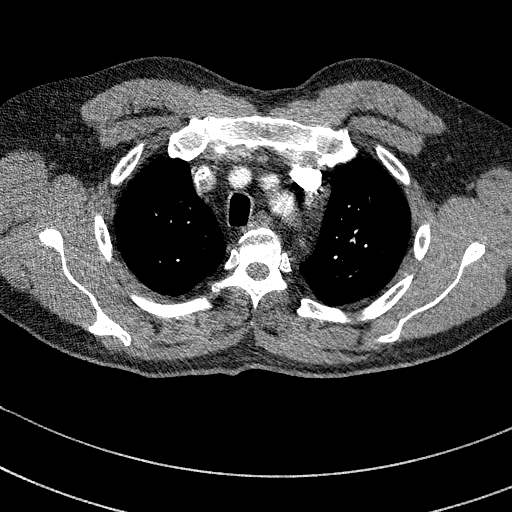
[im 181/209  lung]
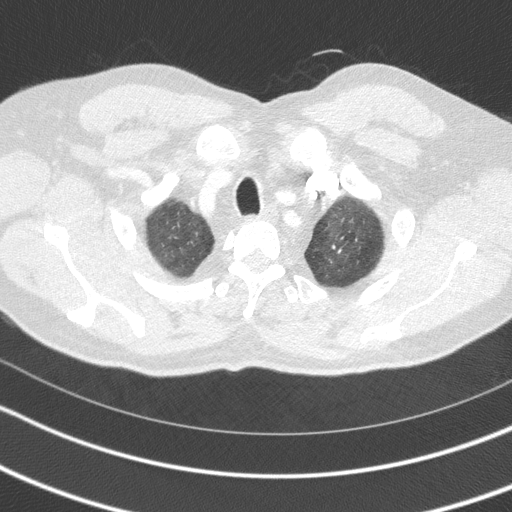
[im 195/209  soft-tissue]
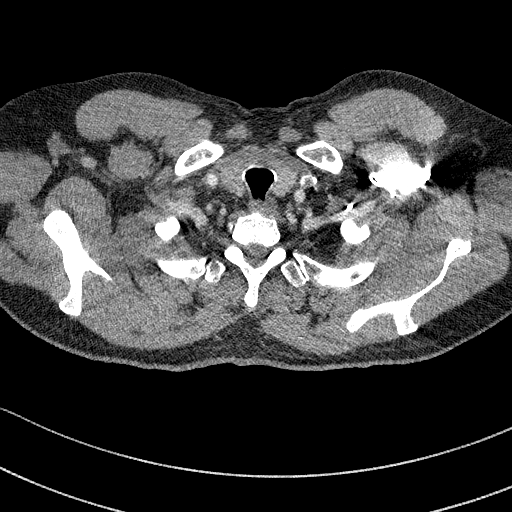

[Series 7: cor soft · coronal · 0.46mm/px · 3 of 129 slices shown]
[im 33/129  soft-tissue]
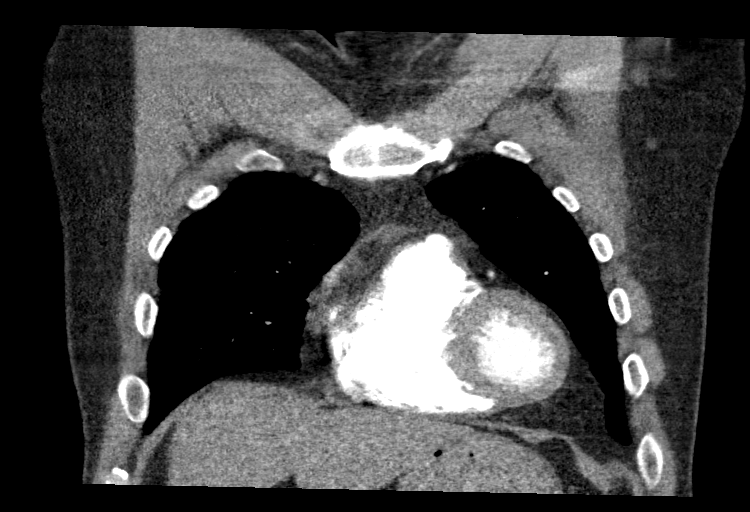
[im 65/129  soft-tissue]
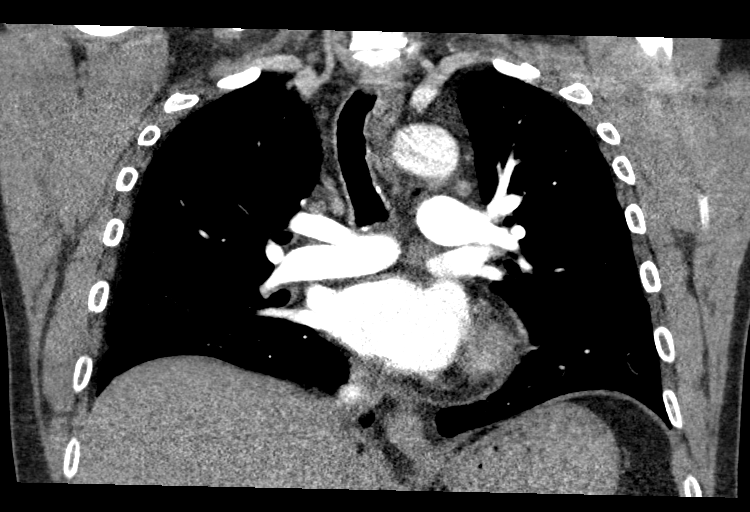
[im 97/129  soft-tissue]
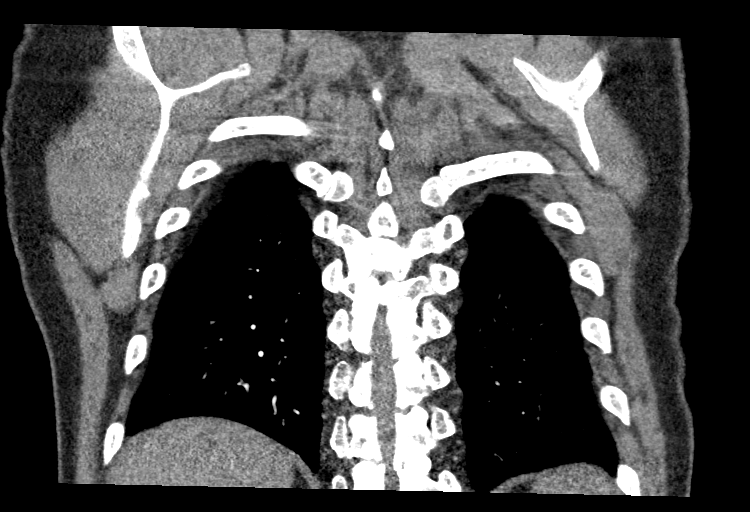

[17 of 46 positions shown; findings below may reference images not displayed]

FINDINGS: Cardiovascular: Normal caliber thoracic aorta. Normal heart size. No
substantial pericardial fluid.

Normal caliber of the central pulmonary vessels. No SVC narrowing.
LEFT brachiocephalic vein is patent into the SVC. RIGHT-sided venous
structures not well assessed.

No signs of pulmonary embolism. Pulmonary arterial opacification is
good. Study quality overall is good.

Mediastinum/Nodes: Mildly patulous esophagus. No signs of adenopathy
in the chest.

Lungs/Pleura: No effusion. No consolidation. Ground-glass seen on
the previous study may be slightly improved. There is no change in
the appearance of a small RIGHT upper lobe pulmonary nodule (image
39) 4 mm.

Subpleural reticulation at the lung bases is similar accounting for
respiratory motion that was present on the previous study. Airways
are patent.

Upper Abdomen: No acute upper abdominal process, to the extent
visualized.

Musculoskeletal: No acute bone finding. No destructive bone process.

Review of the MIP images confirms the above findings.
IMPRESSION: 1. No evidence of pulmonary embolism.
2. Ground-glass seen on the previous study may be slightly improved,
changes of subpleural reticulation are similar accounting for motion
seen on the prior study. No acute pulmonary findings.
3. Stable 4 mm RIGHT upper lobe pulmonary nodule. Consider follow-up
at 12 months for high risk patient from the previous evaluation.

## 2021-04-18 MED ORDER — IOHEXOL 350 MG/ML SOLN
75.0000 mL | Freq: Once | INTRAVENOUS | Status: AC | PRN
  Administered 2021-04-18: 75 mL via INTRAVENOUS

## 2021-04-18 NOTE — Progress Notes (Addendum)
CC: Facial swelling   HPI:  [name] is a 60 y.o. with past medical history of hypertension, alpha thalassemia, post-COVID pulmonary fibrosis, who presents to clinic to follow-up on leukocytosis and new facial swelling.  Patient reports new onset of swelling 1 month ago, described as heaviness and pain of his face and eyes.  Denies lower extremity swelling.  Reports tightness sensation of bilateral hands, states that his ring finger feels tighter.  Denies any change in medication in the last month except for taking vitamin C supplement.  No known allergies.  Reports worsening of shortness of breath with inability to expectorate.  Also reports abdominal bloating after eating that also started 1 month ago.  Reports constipation, emesis and melena.  Denies hematochezia, hematemesis.  Denies dysuria.  Denies fever, chills, weight loss.  Past Medical History:  Diagnosis Date  . Anemia   . CHF (congestive heart failure) ([place])   . Dental infection 08/02/2018  . GERD (gastroesophageal reflux disease)   . Hypertension   . Shingles rash 07/07/2019  . Thalassemia, alpha ([place])   . Viral gastritis 02/08/2019   Review of Systems:  Review of Systems  Constitutional: Negative for chills, fever and weight loss.  Eyes: Positive for pain.  Respiratory: Positive for shortness of breath and wheezing. Negative for sputum production.   Cardiovascular: Negative for orthopnea and leg swelling.  Gastrointestinal: Positive for abdominal pain, constipation, melena, nausea and vomiting. Negative for blood in stool.  Genitourinary: Negative for dysuria.  Musculoskeletal: Positive for neck pain.  Neurological: Positive for weakness.    Physical Exam:  Vitals:   04/18/21 1006 04/18/21 1105  BP: (!) 160/85 (!) 165/99  Pulse: 73 72  Temp: 98.2 F (36.8 C)   TempSrc: Oral   SpO2: 98%   Weight: 155 lb 1.6 oz (70.4 kg)   Height: 5\' 2"  (1.575 m)    Physical Exam Constitutional:      General: He is not  in acute distress. HENT:     Head:     Comments: Swelling of face and neck.  There are induration of bilateral maxillofacial area.  No cervical or supraclavicular adenopathy palpated.    Mouth/Throat:     Mouth: Mucous membranes are moist.     Comments: Missing teeth Eyes:     General: No scleral icterus.       Right eye: No discharge.        Left eye: No discharge.     Conjunctiva/sclera: Conjunctivae normal.  Cardiovascular:     Rate and Rhythm: Normal rate and regular rhythm.     Comments: No LE edema Pulmonary:     Effort: Pulmonary effort is normal.     Comments: Diffuse expiratory wheezing and rales auscultated Abdominal:     General: Bowel sounds are normal. There is distension.     Tenderness: There is abdominal tenderness (Epigastric pain). There is no right CVA tenderness or left CVA tenderness.     Comments: Tympanic to percussion throughout  Musculoskeletal:     Comments: Diminished sensation of left UE  Skin:    General: Skin is warm.     Coloration: Skin is not jaundiced.  Neurological:     Mental Status: He is alert.  Psychiatric:        Mood and Affect: Mood normal.        Thought Content: Thought content normal.        Judgment: Judgment normal.     Assessment & Plan:   See  Encounters Tab for problem based charting.  Patient seen with Dr. Jimmye Norman

## 2021-04-18 NOTE — Assessment & Plan Note (Signed)
Persistent shortness of breath.  Rales and expiratory wheezing auscultated on exam.  -Pending CTA -Continue Symbicort

## 2021-04-18 NOTE — Patient Instructions (Signed)
[name],  It was nice seeing you in the clinic today. Here is a summary of what we talked about:  1. Facial swelling: I will obtain a CT scan of your chest to find out the cause of your facial swelling.  I will also obtain blood work as well.  2.  Neck pain: I will send a referral to a neurosurgeon locally.  Please come back in 4 weeks for follow up  Take care,  Dr. Alfonse Spruce

## 2021-04-18 NOTE — Assessment & Plan Note (Signed)
History of cervical radiculopathy on MRI.  Patient were referred to neurosurgery in the past but unsuccessful due to lack of coverage.  States that he recently gotten insurance.  We will resend so referred to neurosurgery.  -Continue gabapentin and duloxetine

## 2021-04-18 NOTE — Assessment & Plan Note (Addendum)
Patient reports new onset of swelling 1 month ago, described as heaviness and pain of his face and eyes.  Denies lower extremity swelling.  Reports tightness sensation of bilateral hands, states that his ring finger feels tighter.  Denies any change in medication in the last month except for taking vitamin C supplement.  No known allergies.  Reports worsening of shortness of breath with inability to expectorate.  Also reports abdominal bloating after eating that also started 1 month ago.  Reports constipation, emesis and melena.  Denies hematochezia, hematemesis.  Denies dysuria.  Denies fever, chills, weight loss.  Physical exam showed swelling of face and neck.  Thought induration of bilateral maxillofacial area.  No cervical or supraclavicular adenopathy palpated.  Assessment and plan Unclear cause at this time.  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  Low suspicion for allergic reaction due to chronicity.  Not consistent with lupus.  No new medications except for vitamin C started recently.  Patient is on valsartan for a few months, which is less likely to cause angioedema.  -Obtain CTA to rule out any underlying cause for superior vena cava syndrome -Check CBC, CMP, ESR and CRP  Addendum CTA was negative for PE or SVC narrowing, No new mass. CMP was normal. CBC showed improved leukocytosis with no elevated eosinophils. No clear cause of his facial swelling at this time. Will advise patient to take antihistamine and follow up in a few days.

## 2021-04-18 NOTE — Assessment & Plan Note (Signed)
Persistent leukocytosis in 2021.  Differential showed elevated neutrophils.  Pap smear showed neutrophilia with macrocytic anemia with polychromasia and target cells, consistent with alpha thalassemia.  Inhaled steroid is not likely to cause leukocytosis.  Working up for other infectious source.  -Repeat CBC with differential -Obtain ESR and CRP

## 2021-04-18 NOTE — Assessment & Plan Note (Signed)
Patient reports recent onset of abdominal bloating, constipation, emesis and melena.  He is on pantoprazole.  Patient has had a colonoscopy and endoscopy in 2019 which only showed gastritis and negative for H. pylori.  No obvious source of bleeding was found.  His microcytic anemia with thought due to alpha thalassemia.  Patient continues to have melena with microcytic anemia.  Will need to be evaluated by GI after his conditions are stable.

## 2021-04-23 ENCOUNTER — Other Ambulatory Visit (HOSPITAL_COMMUNITY): Payer: Self-pay

## 2021-04-23 NOTE — Progress Notes (Signed)
Internal Medicine Clinic Attending  I saw and evaluated the patient.  I personally confirmed the key portions of the history and exam documented by Dr. Alfonse Spruce and I reviewed pertinent patient test results.  The assessment, diagnosis, and plan were formulated together and I agree with the documentation in the resident's note.  Very perplexing case; most important to r/o is SVC syndrome.  If he were to have developed a venoocclusive intrathoracic mass, it would have had to develop very quickly, as he has had fairly recent reassuring imaging.  No other obvious underlying conditions to explain this facial plethora, ([place] a consideration) though he doesn't have characteristic venous distention of the neck/chest/upper arms.  The localized mildly tender indurated areas of his maxillary under eye areas is not c/w infection and has no obvious explanation at this point in w/u.  He will need ongoing f/u and evaluation to determine underlying problem(s).

## 2021-04-25 ENCOUNTER — Ambulatory Visit (INDEPENDENT_AMBULATORY_CARE_PROVIDER_SITE_OTHER): Payer: Medicare Other | Admitting: Family Medicine

## 2021-04-25 ENCOUNTER — Other Ambulatory Visit: Payer: Self-pay

## 2021-04-25 ENCOUNTER — Encounter: Payer: Self-pay | Admitting: Family Medicine

## 2021-04-25 VITALS — BP 120/88 | Ht 62.0 in | Wt 155.0 lb

## 2021-04-25 DIAGNOSIS — G992 Myelopathy in diseases classified elsewhere: Secondary | ICD-10-CM

## 2021-04-25 DIAGNOSIS — M4802 Spinal stenosis, cervical region: Secondary | ICD-10-CM

## 2021-04-25 NOTE — Progress Notes (Signed)
    SUBJECTIVE:   CHIEF COMPLAINT / HPI:   Neck pain  Patient presenting for follow-up of neck pain that has been present since 2020.  Patient has been seen multiple times at the internal medicine clinic and has been managed medically with Cymbalta and gabapentin.  Due to previous noninsured status, he has had difficulty following up with specialty providers.  Today, patient reports he continues to have pain in his bilateral neck that radiates to his head and neck bilateral shoulders.  He has pain during the day and at night, reports worse at night with sleeping.  It is relieved with some exercises but typically returns quickly.  He does report some left upper extremity cramping when grabbing or gripping as well as numbness along the posterior aspect of his forearm.  There is an in person interpreter present for the entirety of this exam  PERTINENT  PMH / PSH: No previous neck surgeries  OBJECTIVE:  BP 120/88   Ht 5\' 2"  (1.575 m)   Wt 155 lb (70.3 kg)   BMI 28.35 kg/m   General: Well-appearing male, no acute distress Head: NCAT Neck: On inspection, no gross abnormalities, swelling, erythema.  On palpation, tenderness to paracervical area bilaterally as well as midline tenderness.  Patient has full range of motion.  5 out of 5 strength testing. UE: No gross abnormalities, muscle atrophy, fibrillation. Full ROM of b/l UE.  Strength testing: Deltoid symmetric  5/5; Biceps 4/5, R>L; Triceps 4/5 R>L; Wrist extension 5/5; Finger abduction 5/5, 5/5 symmetric grip strength Decreased sensation of posterior forearm   ASSESSMENT/PLAN:   Stenosis of cervical spine with myelopathy (HCC) Given MRI and physical exam findings showing left upper extremity weakness as well as sensation deficit, neurosurgical consult indicated.   Patient has open referral with [place] [facility].  We will attempt to schedule appointment for patient today in office.  Wilber Oliphant, MD [facility] PGY 3

## 2021-04-25 NOTE — Patient Instructions (Signed)
[place] in [place], [place] in: [facility] Address: [address], [place], [place] [address]  Phone: [phone]

## 2021-04-25 NOTE — Progress Notes (Signed)
[facility] Attending Note: I have seen and examined this patient. I have discussed this patient with the resident and reviewed the assessment and plan as documented above. I agree with the resident's findings and plan.  

## 2021-05-02 ENCOUNTER — Other Ambulatory Visit: Payer: Self-pay

## 2021-05-02 ENCOUNTER — Encounter: Payer: Self-pay | Admitting: Student

## 2021-05-02 ENCOUNTER — Ambulatory Visit (INDEPENDENT_AMBULATORY_CARE_PROVIDER_SITE_OTHER): Payer: Medicare Other | Admitting: Student

## 2021-05-02 ENCOUNTER — Other Ambulatory Visit (HOSPITAL_COMMUNITY): Payer: Self-pay

## 2021-05-02 VITALS — BP 141/95 | HR 84 | Temp 98.3°F | Ht 62.0 in | Wt 152.1 lb

## 2021-05-02 DIAGNOSIS — R220 Localized swelling, mass and lump, head: Secondary | ICD-10-CM

## 2021-05-02 DIAGNOSIS — I10 Essential (primary) hypertension: Secondary | ICD-10-CM

## 2021-05-02 DIAGNOSIS — M5412 Radiculopathy, cervical region: Secondary | ICD-10-CM | POA: Diagnosis not present

## 2021-05-02 MED ORDER — GABAPENTIN 300 MG PO CAPS
900.0000 mg | ORAL_CAPSULE | Freq: Three times a day (TID) | ORAL | 2 refills | Status: DC
  Filled 2021-05-02: qty 180, 20d supply, fill #0
  Filled 2021-06-09: qty 180, 20d supply, fill #1
  Filled 2021-07-11: qty 180, 20d supply, fill #2

## 2021-05-02 MED ORDER — VALSARTAN 320 MG PO TABS
320.0000 mg | ORAL_TABLET | Freq: Every day | ORAL | 2 refills | Status: DC
  Filled 2021-05-02: qty 30, 30d supply, fill #0
  Filled 2021-06-09: qty 30, 30d supply, fill #1
  Filled 2021-07-11: qty 30, 30d supply, fill #2

## 2021-05-02 NOTE — Progress Notes (Signed)
   CC: Follow-up blood pressure and facial swelling  HPI:  Eric Ingram is a 60 y.o. with past medical history of hypertension, post COVID pulmonary fibrosis, cervical radiculopathy, who presented to the clinic today to follow-up on his blood pressure and facial swelling.  Please see problem based charting for detail.  Past Medical History:  Diagnosis Date  . Anemia   . CHF (congestive heart failure) ([place])   . Dental infection 08/02/2018  . GERD (gastroesophageal reflux disease)   . Hypertension   . Shingles rash 07/07/2019  . Thalassemia, alpha ([place])   . Viral gastritis 02/08/2019   Review of Systems: As per HPI  Physical Exam:  Vitals:   05/02/21 0916 05/02/21 0941  BP: (!) 149/98 (!) 141/95  Pulse: 84   Temp: 98.3 F (36.8 C)   TempSrc: Oral   SpO2: 98%   Weight: 152 lb 1.6 oz (69 kg)   Height: 5\' 2"  (1.575 m)    Physical Exam Constitutional:      General: He is not in acute distress.    Appearance: He is not toxic-appearing.  HENT:     Head:     Comments: Facial plethora still present but improved.  Nontender to palpation.  No erythematous.  There are persistent induration at bilateral axillary area which is nontender to touch and nonerythematous.    Ears:     Comments: Bilateral tympanic membranes appear normal, not erythematous    Nose:     Comments: No rhinorrhea.  Bilateral nares appears normal.  No mass noted.    Mouth/Throat:     Mouth: Mucous membranes are moist.  Eyes:     General: No scleral icterus.       Right eye: No discharge.        Left eye: No discharge.     Conjunctiva/sclera: Conjunctivae normal.  Cardiovascular:     Rate and Rhythm: Normal rate and regular rhythm.     Comments: No UA and LE edema Pulmonary:     Effort: Pulmonary effort is normal. No respiratory distress.     Breath sounds: Normal breath sounds.  Skin:    General: Skin is warm.     Coloration: Skin is not jaundiced.  Neurological:     Mental Status: He is alert.   Psychiatric:        Mood and Affect: Mood normal.        Thought Content: Thought content normal.        Judgment: Judgment normal.     Assessment & Plan:   See Encounters Tab for problem based charting.  Patient discussed with Dr. Dareen Piano

## 2021-05-02 NOTE — Patient Instructions (Signed)
[name],  It was nice seeing you in the clinic today.  Here is a summary of what we talked about:  1.  Facial swelling: I will check urine for protein.  Please take antihistamine medication such as Allegra or Claritin.  2.  High blood pressure: I will increase the valsartan to 320 mg daily.  Please continue metoprolol.  3. Neck pain: Please call the neurosurgery office to make an appointment as soon as possible.  Please come back in 3-4 weeks for blood pressure recheck and blood work.  Take care,  Dr. Alfonse Spruce

## 2021-05-02 NOTE — Assessment & Plan Note (Addendum)
Patient presented at last visit with isolated facial swelling that started 1 month ago.  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  Result came back negative for PE or any mass.  CBC showed improved leukocytosis without eosinophils.  CMP was normal, as well as ESR and CRP.  Patient was advised to take antihistamine but he did not.  Patient reports feeling well today.  Say that his face still feels swollen.  Denies any trigger factors, denies any food allergy or new medication started.  Denies fever, runny nose, ear pain, coughing, sore throat, respiratory symptoms.   Assessment and plan Still unclear cause of his facial plethora.  Superior vena cava syndrome was ruled out.  Blood work was unrevealing.  We will obtain UA and protein/creatinine ratio to rule out nephrotic syndrome.  In the meantime advised patient to take antihistamine.  Addendum UA negative for proteinuria. Protein/cr ratio WNL. Nephrotic syndrome rules out.

## 2021-05-02 NOTE — Assessment & Plan Note (Signed)
Initial blood pressure 149/98.  Repeat 141/95.  Patient reports compliant with his medication.  Usually take medications at night.  Home regimen: Valsartan 160 mg and metoprolol 25 mg  Assessment and plan Blood pressure remains above goal.  Will increase valsartan to 320 mg daily. If he does have a nephrotic syndrome, this will help slow down the proteinuria.  -Valsartan 320 mg daily -Toprol 25 mg daily -Return in 3 weeks for blood pressure recheck and BMP

## 2021-05-02 NOTE — Assessment & Plan Note (Signed)
History of cervical radiculopathy.  Patient was seen by sport medicine clinic recently.  Patient was given contact impression of the neurosurgery office.  Advised patient to call as soon as possible to make an appointment.  -Continue gabapentin and duloxetine

## 2021-05-03 LAB — URINALYSIS, ROUTINE W REFLEX MICROSCOPIC
Bilirubin, UA: NEGATIVE
Glucose, UA: NEGATIVE
Leukocytes,UA: NEGATIVE
Nitrite, UA: NEGATIVE
Protein,UA: NEGATIVE
RBC, UA: NEGATIVE
Specific Gravity, UA: 1.025 (ref 1.005–1.030)
Urobilinogen, Ur: 1 mg/dL (ref 0.2–1.0)
pH, UA: 6.5 (ref 5.0–7.5)

## 2021-05-03 LAB — PROTEIN / CREATININE RATIO, URINE
Creatinine, Urine: 198.9 mg/dL
Protein, Ur: 32 mg/dL
Protein/Creat Ratio: 161 mg/g creat (ref 0–200)

## 2021-05-05 NOTE — Progress Notes (Signed)
Internal Medicine Clinic Attending  Case discussed with Dr. Nguyen  At the time of the visit.  We reviewed the resident's history and exam and pertinent patient test results.  I agree with the assessment, diagnosis, and plan of care documented in the resident's note. 

## 2021-05-07 ENCOUNTER — Other Ambulatory Visit (HOSPITAL_COMMUNITY): Payer: Self-pay

## 2021-05-09 ENCOUNTER — Other Ambulatory Visit (HOSPITAL_COMMUNITY): Payer: Self-pay

## 2021-05-09 MED FILL — Duloxetine HCl Enteric Coated Pellets Cap 60 MG (Base Eq): ORAL | 30 days supply | Qty: 30 | Fill #1 | Status: AC

## 2021-05-09 MED FILL — Budesonide-Formoterol Fumarate Dihyd Aerosol 160-4.5 MCG/ACT: RESPIRATORY_TRACT | 30 days supply | Qty: 10.2 | Fill #0 | Status: AC

## 2021-06-09 ENCOUNTER — Other Ambulatory Visit (HOSPITAL_COMMUNITY): Payer: Self-pay

## 2021-06-09 MED FILL — Budesonide-Formoterol Fumarate Dihyd Aerosol 160-4.5 MCG/ACT: RESPIRATORY_TRACT | 30 days supply | Qty: 10.2 | Fill #1 | Status: AC

## 2021-06-09 MED FILL — Duloxetine HCl Enteric Coated Pellets Cap 60 MG (Base Eq): ORAL | 30 days supply | Qty: 30 | Fill #2 | Status: AC

## 2021-06-26 ENCOUNTER — Other Ambulatory Visit (HOSPITAL_COMMUNITY): Payer: Self-pay | Admitting: Neurosurgery

## 2021-06-26 DIAGNOSIS — G959 Disease of spinal cord, unspecified: Secondary | ICD-10-CM

## 2021-07-05 ENCOUNTER — Ambulatory Visit (HOSPITAL_COMMUNITY)
Admission: RE | Admit: 2021-07-05 | Discharge: 2021-07-05 | Disposition: A | Discharge status: Home / Self Care | Payer: Medicare Other | Source: Ambulatory Visit | Attending: Neurosurgery | Admitting: Neurosurgery

## 2021-07-05 ENCOUNTER — Other Ambulatory Visit: Payer: Self-pay

## 2021-07-05 DIAGNOSIS — G959 Disease of spinal cord, unspecified: Secondary | ICD-10-CM | POA: Diagnosis present

## 2021-07-05 IMAGING — MR MR CERVICAL SPINE W/O CM
4 of 5 series · 19 of 48 positions shown · non-contrast
Comparison: [DATE]

CLINICAL DATA: Myelopathy

EXAM:
MRI CERVICAL SPINE WITHOUT CONTRAST
TECHNIQUE: Multiplanar, multisequence MR imaging of the cervical spine was
performed. No intravenous contrast was administered.

[Series 2: T2 · sagittal · 3.0mm · 0.43mm/px · 5 of 17 slices shown (1 of 2)]
[im 1/17]
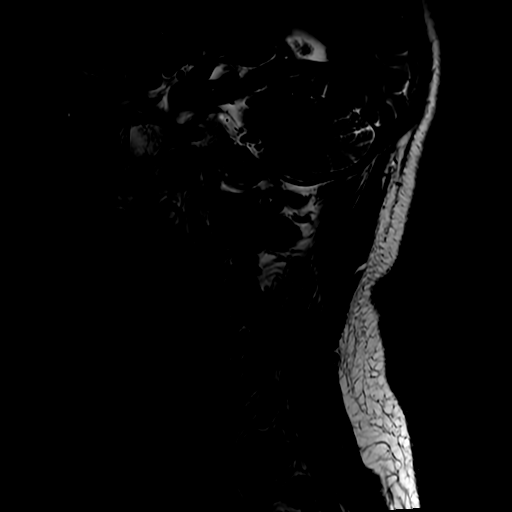
[im 5/17]
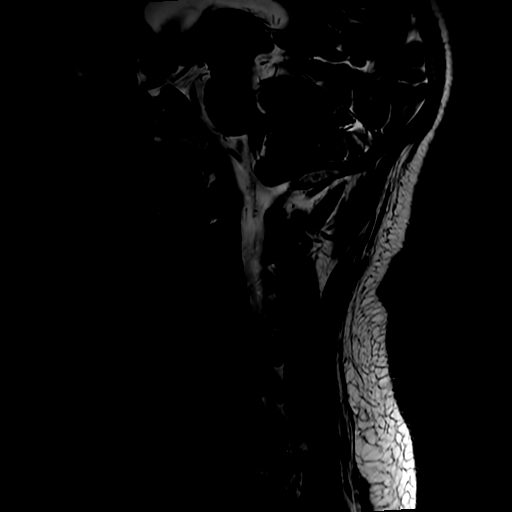
[im 9/17]
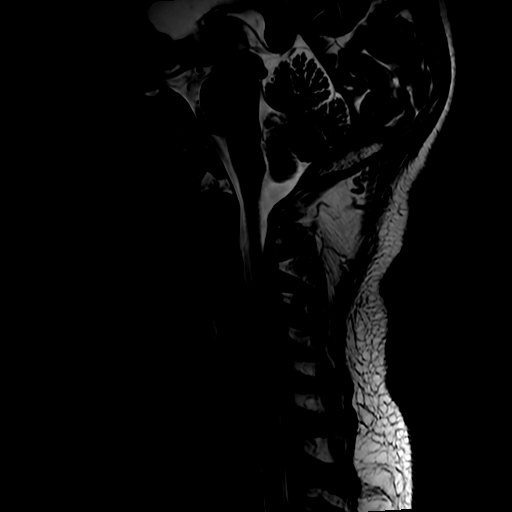
[im 13/17]
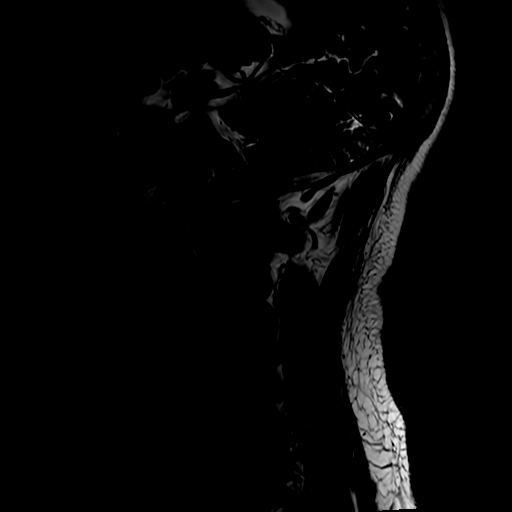
[im 17/17]
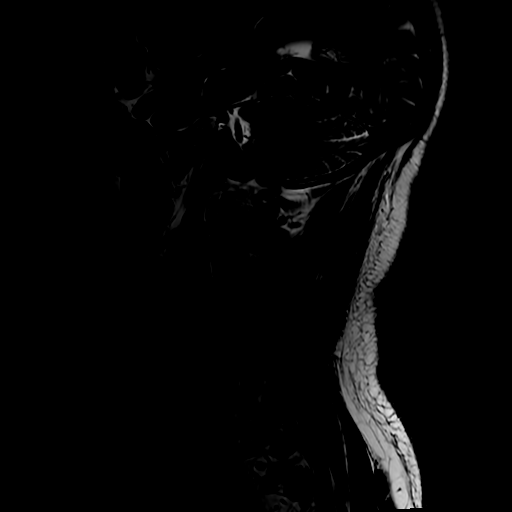

[Series 3: FLAIR · sagittal · 3.0mm · 0.43mm/px · 3 of 17 slices shown]
[im 1/17]
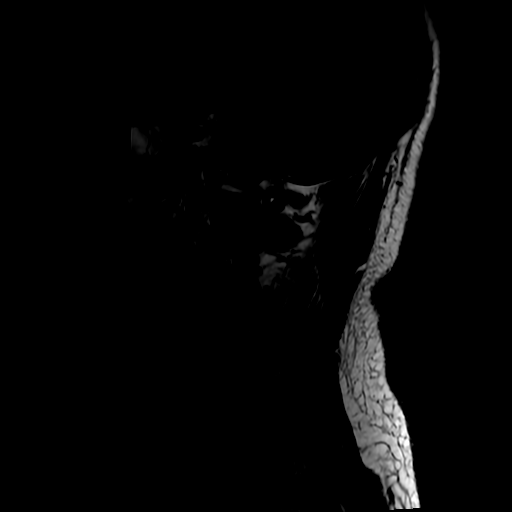
[im 11/17]
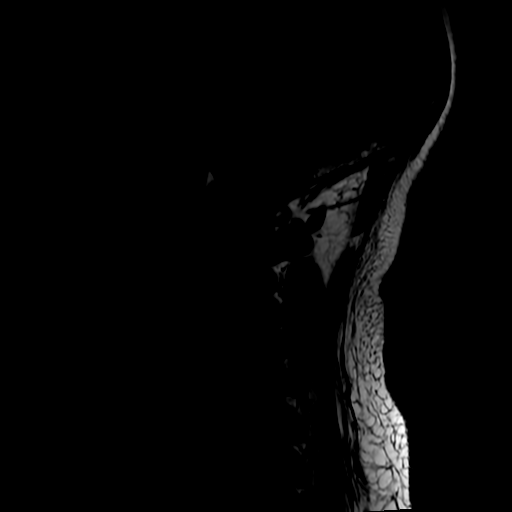
[im 17/17]
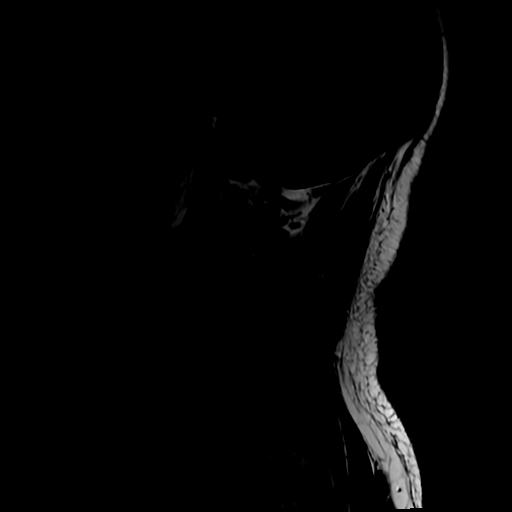

[Series 4: STIR · sagittal · 3.0mm · 0.43mm/px · 3 of 17 slices shown]
[im 1/17]
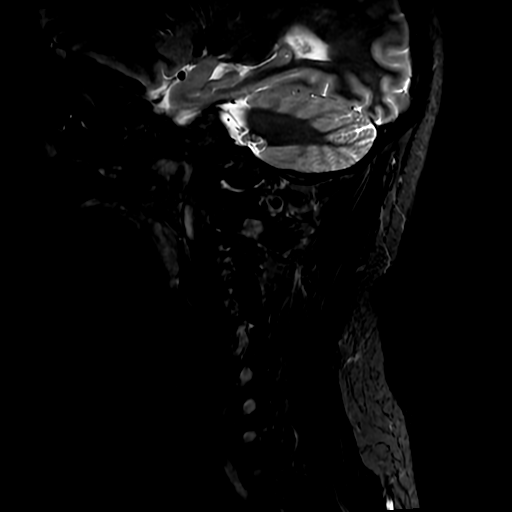
[im 11/17]
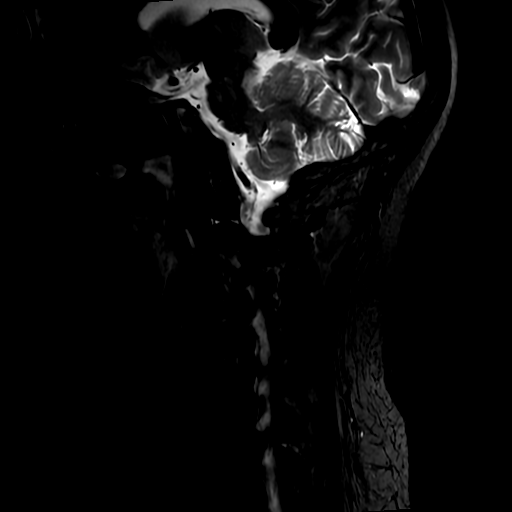
[im 17/17]
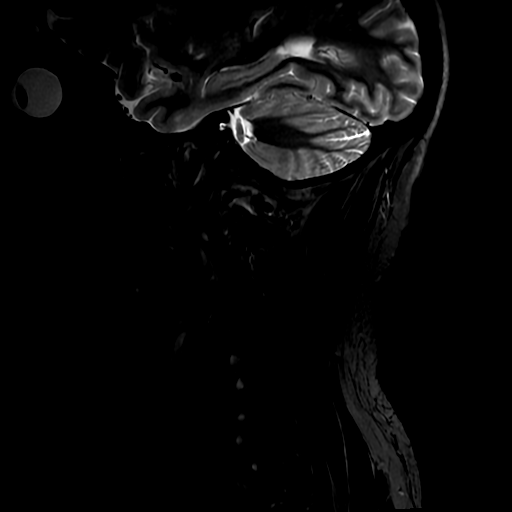

[Series 6: T2 · axial · 3.0mm · 0.35mm/px · z∈[-20,+74]mm · 8 of 36 slices shown (2 of 2)]
[im 1/36]
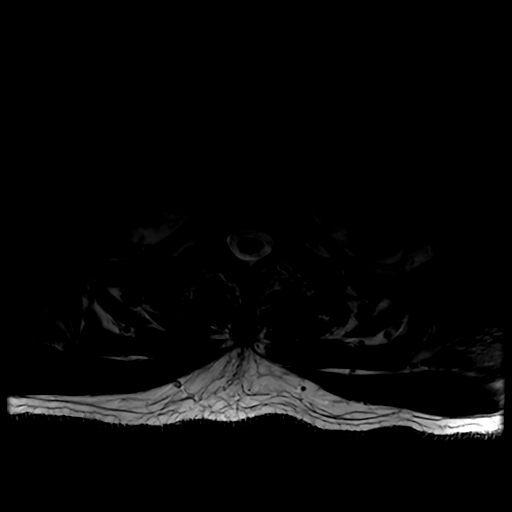
[im 5/36]
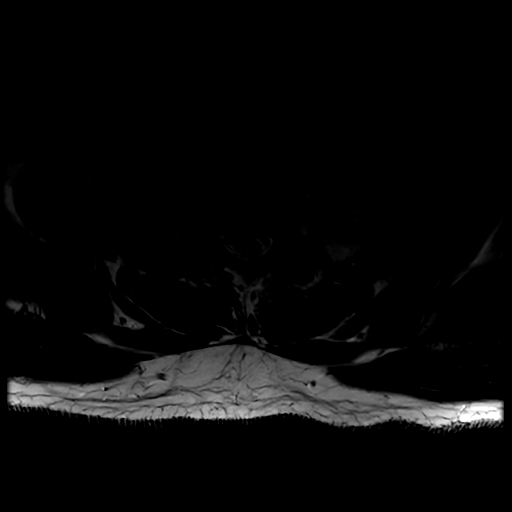
[im 9/36]
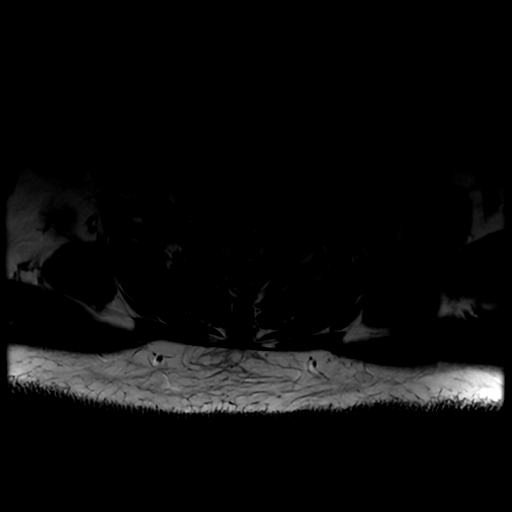
[im 14/36]
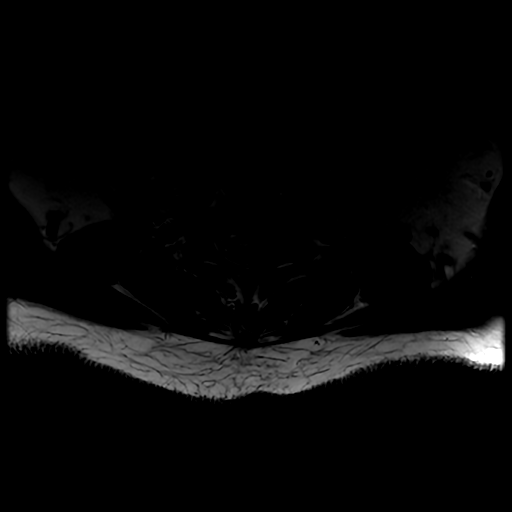
[im 18/36]
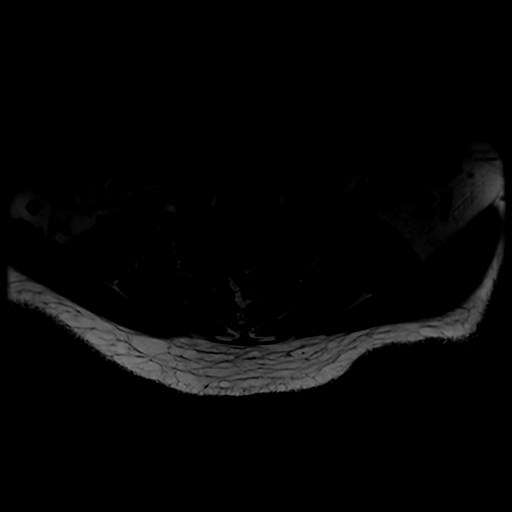
[im 22/36]
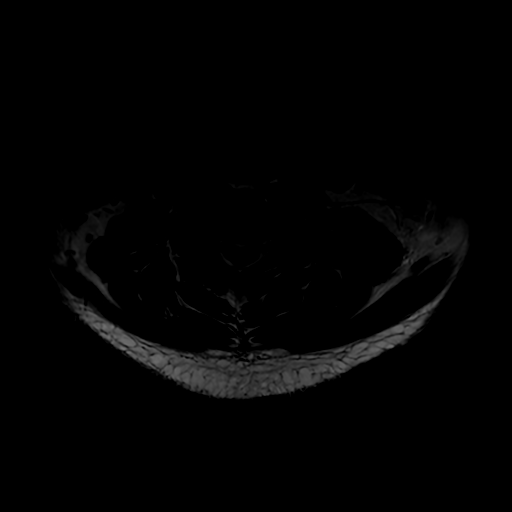
[im 27/36]
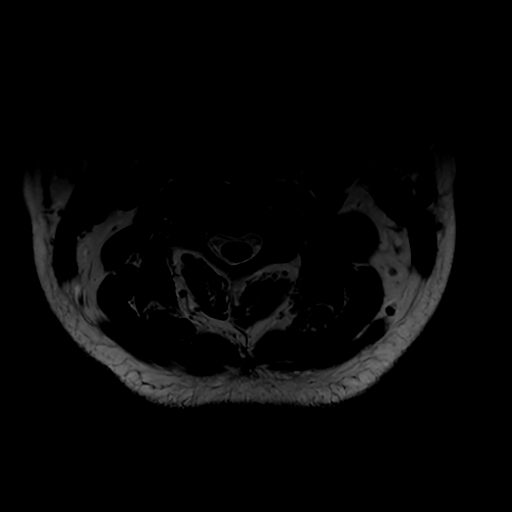
[im 31/36]
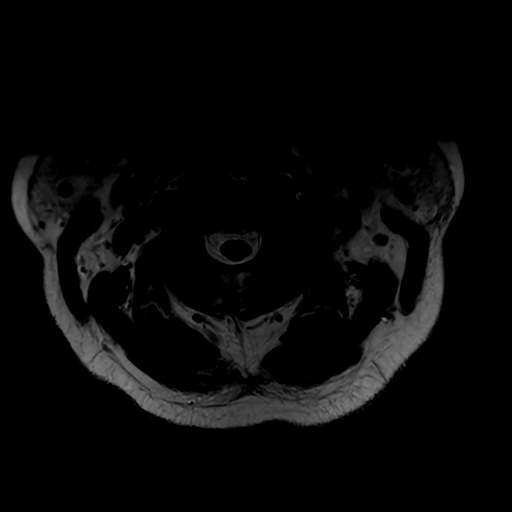

[19 of 48 positions shown; findings below may reference images not displayed]

FINDINGS: Alignment: Physiologic. Reversal of normal cervical lordosis may be
positional or due to muscle spasm.

Vertebrae: No fracture, evidence of discitis, or bone lesion.

Cord: Normal signal and morphology.

Posterior Fossa, vertebral arteries, paraspinal tissues: Negative.

Disc levels:

C1-2: Unremarkable.

C2-3: Normal disc space and facet joints. There is no spinal canal
stenosis. No neural foraminal stenosis.

C3-4: Small central disc protrusion. There is no spinal canal
stenosis. Mild right neural foraminal stenosis.

C4-5: Small disc bulge with uncovertebral hypertrophy, greatest in
the left subarticular zone, unchanged. Unchanged mild spinal canal
stenosis. Unchanged moderate right neural foraminal stenosis.

C5-6: Small disc bulge with uncovertebral hypertrophy. Unchanged
moderate spinal canal stenosis. Unchanged severe bilateral neural
foraminal stenosis.

C6-7: Small disc bulge, unchanged. There is no spinal canal
stenosis. No neural foraminal stenosis.

C7-T1: Unchanged left foraminal disc protrusion. There is no spinal
canal stenosis. Unchanged severe left neural foraminal stenosis.
IMPRESSION: 1. Unchanged moderate C5-6 spinal canal stenosis and severe
bilateral neural foraminal stenosis.
2. Unchanged severe left C7-T1 neural foraminal stenosis secondary
to left foraminal disc protrusion.
3. Unchanged mild C4-5 spinal canal stenosis and moderate right
neural foraminal stenosis.

## 2021-07-11 ENCOUNTER — Other Ambulatory Visit (HOSPITAL_COMMUNITY): Payer: Self-pay

## 2021-07-11 ENCOUNTER — Other Ambulatory Visit: Payer: Self-pay | Admitting: Internal Medicine

## 2021-07-11 DIAGNOSIS — M5412 Radiculopathy, cervical region: Secondary | ICD-10-CM

## 2021-07-15 ENCOUNTER — Other Ambulatory Visit: Payer: Self-pay | Admitting: Internal Medicine

## 2021-07-15 ENCOUNTER — Other Ambulatory Visit (HOSPITAL_COMMUNITY): Payer: Self-pay

## 2021-07-15 DIAGNOSIS — M5412 Radiculopathy, cervical region: Secondary | ICD-10-CM

## 2021-07-16 ENCOUNTER — Other Ambulatory Visit: Payer: Self-pay | Admitting: Internal Medicine

## 2021-07-16 ENCOUNTER — Other Ambulatory Visit (HOSPITAL_COMMUNITY): Payer: Self-pay

## 2021-07-16 DIAGNOSIS — M5412 Radiculopathy, cervical region: Secondary | ICD-10-CM

## 2021-07-16 MED ORDER — DULOXETINE HCL 60 MG PO CPEP
60.0000 mg | ORAL_CAPSULE | Freq: Every day | ORAL | 3 refills | Status: DC
  Filled 2021-07-16: qty 30, 30d supply, fill #0
  Filled 2021-08-15: qty 30, 30d supply, fill #1
  Filled 2021-09-18: qty 30, 30d supply, fill #2
  Filled 2021-10-23: qty 30, 30d supply, fill #3

## 2021-07-25 ENCOUNTER — Other Ambulatory Visit: Payer: Self-pay

## 2021-07-25 ENCOUNTER — Ambulatory Visit (INDEPENDENT_AMBULATORY_CARE_PROVIDER_SITE_OTHER): Payer: Medicare Other | Admitting: Student

## 2021-07-25 ENCOUNTER — Encounter: Payer: Self-pay | Admitting: Student

## 2021-07-25 ENCOUNTER — Other Ambulatory Visit (HOSPITAL_COMMUNITY): Payer: Self-pay

## 2021-07-25 VITALS — BP 162/88 | HR 82 | Temp 98.3°F | Ht 62.0 in | Wt 156.6 lb

## 2021-07-25 DIAGNOSIS — D560 Alpha thalassemia: Secondary | ICD-10-CM

## 2021-07-25 DIAGNOSIS — M5412 Radiculopathy, cervical region: Secondary | ICD-10-CM | POA: Diagnosis not present

## 2021-07-25 DIAGNOSIS — D72829 Elevated white blood cell count, unspecified: Secondary | ICD-10-CM

## 2021-07-25 DIAGNOSIS — R1013 Epigastric pain: Secondary | ICD-10-CM | POA: Diagnosis not present

## 2021-07-25 DIAGNOSIS — D509 Iron deficiency anemia, unspecified: Secondary | ICD-10-CM

## 2021-07-25 DIAGNOSIS — I10 Essential (primary) hypertension: Secondary | ICD-10-CM

## 2021-07-25 DIAGNOSIS — K219 Gastro-esophageal reflux disease without esophagitis: Secondary | ICD-10-CM | POA: Diagnosis not present

## 2021-07-25 MED ORDER — VALSARTAN 320 MG PO TABS
320.0000 mg | ORAL_TABLET | Freq: Every day | ORAL | 2 refills | Status: DC
  Filled 2021-07-25 – 2021-08-25 (×2): qty 30, 30d supply, fill #0
  Filled 2021-09-18: qty 30, 30d supply, fill #1
  Filled 2021-10-23: qty 30, 30d supply, fill #2

## 2021-07-25 MED ORDER — PANTOPRAZOLE SODIUM 40 MG PO TBEC
40.0000 mg | DELAYED_RELEASE_TABLET | Freq: Two times a day (BID) | ORAL | 2 refills | Status: DC
Start: 2021-07-25 — End: 2021-08-12
  Filled 2021-07-25: qty 60, 30d supply, fill #0

## 2021-07-25 MED ORDER — AMLODIPINE BESYLATE 5 MG PO TABS
5.0000 mg | ORAL_TABLET | Freq: Every day | ORAL | 2 refills | Status: DC
  Filled 2021-07-25: qty 30, 30d supply, fill #0

## 2021-07-25 MED ORDER — POLYETHYLENE GLYCOL 3350 17 G PO PACK
17.0000 g | PACK | Freq: Every day | ORAL | 0 refills | Status: DC
  Filled 2021-07-25: qty 28, 28d supply, fill #0

## 2021-07-25 NOTE — Assessment & Plan Note (Addendum)
Blood pressure elevated at 162/88.  Repeat 149/75. Patient reports checking his blood pressure at home with a wrist cuff and systolic usually range [id number].  States that he feels sick if his systolic is in the 0000000 and he usually does not take his blood pressure medication.  He did take his blood pressure medication this morning.   Metoprolol was not in his medication bag.  Patient has not been taking it  -Add amlodipine 5 mg -Continue valsartan 320 mg -Stop metoprolol -Advised patient to take blood pressure at home and keep a log.  Follow-up in 2 weeks

## 2021-07-25 NOTE — Progress Notes (Signed)
   CC: Medication refill, abdominal pain  HPI:  [name] is a 60 y.o. with past medical history of hypertension, post-COVID pulmonary fibrosis, thalassemia who presents to clinic for abdominal pain medication refill.  Please see problem based charting for details  Past Medical History:  Diagnosis Date   Anemia    CHF (congestive heart failure) ([place])    Dental infection 08/02/2018   GERD (gastroesophageal reflux disease)    Hypertension    Shingles rash 07/07/2019   Thalassemia, alpha ([place])    Viral gastritis 02/08/2019   Review of Systems:    Per HPI  Physical Exam:  Vitals:   07/25/21 0953  BP: (!) 162/88  Pulse: 82  Temp: 98.3 F (36.8 C)  TempSrc: Oral  SpO2: 98%  Weight: 156 lb 9.6 oz (71 kg)  Height: '5\' 2"'$  (1.575 m)   Physical Exam Constitutional:      General: He is not in acute distress.    Appearance: He is not toxic-appearing.  HENT:     Head: Normocephalic.     Comments: Improved facial swelling Eyes:     Conjunctiva/sclera: Conjunctivae normal.  Cardiovascular:     Rate and Rhythm: Normal rate and regular rhythm.  Pulmonary:     Effort: No respiratory distress.     Breath sounds: Normal breath sounds. No wheezing.  Abdominal:     General: Bowel sounds are normal.     Comments: Mild abdominal distention Normal bowel sound Mild tenderness to palpation in the lower quadrants.  No guarding No change of the overlying skin  Musculoskeletal:        General: Normal range of motion.  Skin:    General: Skin is warm.     Coloration: Skin is not jaundiced.  Neurological:     Mental Status: He is alert and oriented to person, place, and time.  Psychiatric:        Mood and Affect: Mood normal.        Behavior: Behavior normal.     Assessment & Plan:   See Encounters Tab for problem based charting.  Patient discussed with Dr. Philipp Ovens

## 2021-07-25 NOTE — Patient Instructions (Addendum)
[name],  It was a pleasure seeing you today.  Here is a summary what we talked about:  1.  Abdominal pain: I will increase the Protonix to twice daily.  I also added MiraLAX to help move your bowels.  I will also get a CT of your abdomen.  2.  High blood pressure: I added a medication called amlodipine.  Take 1 tablet a day.  Please keep a log of your blood pressure and bring it here in 2 weeks.  3.  Neck pain: Please follow-up with the neurosurgeon.  4.  High white blood count: I will repeat blood work today and check your abdomen.  If everything comes back normal, I will refer you to a hematologist.  Please return in 2 weeks  Take care  Dr. Alfonse Spruce

## 2021-07-26 LAB — CBC
Hematocrit: 37.9 % (ref 37.5–51.0)
Hemoglobin: 11.7 g/dL — ABNORMAL LOW (ref 13.0–17.7)
MCH: 22.5 pg — ABNORMAL LOW (ref 26.6–33.0)
MCHC: 30.9 g/dL — ABNORMAL LOW (ref 31.5–35.7)
MCV: 73 fL — ABNORMAL LOW (ref 79–97)
Platelets: 362 10*3/uL (ref 150–450)
RBC: 5.21 x10E6/uL (ref 4.14–5.80)
RDW: 15.7 % — ABNORMAL HIGH (ref 11.6–15.4)
WBC: 7.3 10*3/uL (ref 3.4–10.8)

## 2021-07-26 LAB — IRON AND TIBC
Iron Saturation: 42 % (ref 15–55)
Iron: 105 ug/dL (ref 38–169)
Total Iron Binding Capacity: 248 ug/dL — ABNORMAL LOW (ref 250–450)
UIBC: 143 ug/dL (ref 111–343)

## 2021-07-26 LAB — FERRITIN: Ferritin: 221 ng/mL (ref 30–400)

## 2021-07-26 NOTE — Assessment & Plan Note (Signed)
Patient reports chronic abdominal pain.  Locates in the epigastric region.  Endorses acid reflux symptoms with bloating.  Denies fever or chills, nausea, vomiting or diarrhea.  Said that he has 2-3 bowel movements a day but reports small stool caliber.  Denies melena or blood in stool.  He is taking Protonix 40 mg once daily.  Physical exam is unremarkable.  He had an upper endoscopy done in 2019 showed erosive gastritis.  Colonoscopy in 2019 was unremarkable.  Assessment and plan His symptoms are likely related to GERD and gastritis.  No red flag symptoms.  His normal colonoscopy in 2019 is reassuring.  However, with his persistent leukocytosis, microcytic anemia and thrombocytosis, will obtain CT abdomen/pelvis to rule out any infections or malignant source.    -Increase Protonix to 40 mg twice daily -Bowel regimen with MiraLAX -Pending CT abdomen/pelvis -Repeat CBC

## 2021-07-26 NOTE — Assessment & Plan Note (Addendum)
Patient was seen by a neurosurgeon last month.  Repeat MRI not reveal any new change compared to last MRI.  -Advised patient to keep follow-up with neurosurgery for possible surgical intervention -Continue duloxetine and gabapentin

## 2021-07-26 NOTE — Assessment & Plan Note (Signed)
Patient with history of alpha thalassemia.  Repeat CBC showed stable microcytic anemia.  Iron study rules out iron deficiency anemia.  -Continue to monitor.

## 2021-07-26 NOTE — Assessment & Plan Note (Addendum)
Patient has persistent leukocytosis dating back to 2020.  He did have COVID in 2021 which could explain his leukocytosis.  Last CBC checked 3 months ago showed WBC of 14.8.    Assessment and plan Last ESR and CRP were unremarkable.  He had a CT chest which did not show any infection or malignant source.  He also had a peripheral blood smear with only show polychromasia with teardrop cells, consistent with thalassemia.  No evidence of hematologic malignancy.  CBC differential only showed elevated neutrophils, which makes CLL less likely.  Patient denies weight loss, fever, chill, night sweat.  -Repeat CBC showed resolution of leukocytosis.  Microcytic anemia also stable.  Thrombocytosis resolved.  Will continue to monitor for now.    -No indication for heme/onc referral at this time. -Obtain CT abdomen/pelvis to rule out any other infection or malignant source. - No acute finding to explain leukocytosis

## 2021-07-29 NOTE — Progress Notes (Signed)
Internal Medicine Clinic Attending  Case discussed with Dr. Nguyen  At the time of the visit.  We reviewed the resident's history and exam and pertinent patient test results.  I agree with the assessment, diagnosis, and plan of care documented in the resident's note. 

## 2021-08-12 ENCOUNTER — Encounter: Payer: Self-pay | Admitting: Student

## 2021-08-12 ENCOUNTER — Other Ambulatory Visit (HOSPITAL_COMMUNITY): Payer: Self-pay

## 2021-08-12 ENCOUNTER — Ambulatory Visit (INDEPENDENT_AMBULATORY_CARE_PROVIDER_SITE_OTHER): Payer: Medicare Other | Admitting: Student

## 2021-08-12 DIAGNOSIS — J8410 Pulmonary fibrosis, unspecified: Secondary | ICD-10-CM | POA: Diagnosis not present

## 2021-08-12 DIAGNOSIS — E782 Mixed hyperlipidemia: Secondary | ICD-10-CM | POA: Diagnosis not present

## 2021-08-12 DIAGNOSIS — M5412 Radiculopathy, cervical region: Secondary | ICD-10-CM

## 2021-08-12 DIAGNOSIS — J4540 Moderate persistent asthma, uncomplicated: Secondary | ICD-10-CM | POA: Diagnosis not present

## 2021-08-12 DIAGNOSIS — K219 Gastro-esophageal reflux disease without esophagitis: Secondary | ICD-10-CM | POA: Diagnosis present

## 2021-08-12 DIAGNOSIS — I10 Essential (primary) hypertension: Secondary | ICD-10-CM

## 2021-08-12 MED ORDER — BUDESONIDE-FORMOTEROL FUMARATE 160-4.5 MCG/ACT IN AERO
2.0000 | INHALATION_SPRAY | Freq: Two times a day (BID) | RESPIRATORY_TRACT | 2 refills | Status: DC
  Filled 2021-08-12: qty 10.2, 30d supply, fill #0
  Filled 2021-09-18 – 2021-09-30 (×5): qty 10.2, 30d supply, fill #1

## 2021-08-12 MED ORDER — ATORVASTATIN CALCIUM 80 MG PO TABS
80.0000 mg | ORAL_TABLET | Freq: Every day | ORAL | 2 refills | Status: DC
  Filled 2021-08-12: qty 90, 90d supply, fill #0
  Filled 2021-09-30: qty 30, 30d supply, fill #0
  Filled 2021-11-14: qty 30, 30d supply, fill #1
  Filled 2021-12-11: qty 30, 30d supply, fill #2

## 2021-08-12 MED ORDER — PANTOPRAZOLE SODIUM 40 MG PO TBEC
40.0000 mg | DELAYED_RELEASE_TABLET | Freq: Two times a day (BID) | ORAL | 2 refills | Status: DC
  Filled 2021-08-12 – 2021-08-15 (×2): qty 60, 30d supply, fill #0
  Filled 2021-09-18: qty 60, 30d supply, fill #1
  Filled 2021-11-14: qty 60, 30d supply, fill #2

## 2021-08-12 MED ORDER — GABAPENTIN 300 MG PO CAPS
900.0000 mg | ORAL_CAPSULE | Freq: Three times a day (TID) | ORAL | 2 refills | Status: DC
  Filled 2021-08-12: qty 270, 30d supply, fill #0
  Filled 2021-09-18: qty 270, 30d supply, fill #1
  Filled 2021-11-14: qty 270, 30d supply, fill #2

## 2021-08-12 NOTE — Assessment & Plan Note (Signed)
Patient has a follow-up appointment with neurosurgeon on 08/22/2021 -Continue gabapentin and duloxetine

## 2021-08-12 NOTE — Patient Instructions (Addendum)
Eric Ingram,  It was a pleasure seeing you in the clinic today.  Here is a summary what we talked about:  1.  High blood pressure: Please take valsartan daily for the next 2 weeks.  Please stop Amlodipine. Please continue checking your blood pressure.  Please bring your blood pressure cuff with you to the next visit.  2.  Neck pain: Please follow-up with the neurosurgeon as scheduled.  3.  I have sent a refill of your inhaler to the pharmacy.  Patient will need information about financial assistance mailed to him.  Please return in 2 weeks  Take care  Dr. Alfonse Spruce

## 2021-08-12 NOTE — Progress Notes (Signed)
   CC: Follow-up on hypertension  HPI:  [name] is a 60 y.o. with past medical history of hypertension, cervical radiculopathy, post-COVID pulmonary fibrosis who presented here for 4 weeks follow-up for blood pressure.  Please see problem based charting for detail  Past Medical History:  Diagnosis Date   Anemia    CHF (congestive heart failure) ([place])    Dental infection 08/02/2018   GERD (gastroesophageal reflux disease)    Hypertension    Shingles rash 07/07/2019   Thalassemia, alpha ([facility])    Viral gastritis 02/08/2019   Review of Systems: Per HPI  Physical Exam:  Vitals:   08/12/21 0846  BP: (!) 143/86  Pulse: 98  Temp: 98 F (36.7 C)  TempSrc: Oral  SpO2: 100%  Weight: 154 lb (69.9 kg)  Height: '5\' 2"'$  (1.575 m)  Physical Exam Constitutional:      General: He is not in acute distress.    Appearance: He is not toxic-appearing.  HENT:     Head: Normocephalic.  Eyes:     Conjunctiva/sclera: Conjunctivae normal.  Neck:     Comments: Tenderness to palpation of the posterior cervical region.  No change in the overlying skin. Cardiovascular:     Rate and Rhythm: Normal rate and regular rhythm.     Heart sounds: Normal heart sounds.  Pulmonary:     Effort: Pulmonary effort is normal. No respiratory distress.     Breath sounds: Normal breath sounds. No wheezing.  Musculoskeletal:        General: Normal range of motion.     Cervical back: Normal range of motion.  Skin:    General: Skin is warm.     Coloration: Skin is not jaundiced.  Neurological:     Mental Status: He is alert and oriented to person, place, and time.  Psychiatric:        Mood and Affect: Mood normal.        Behavior: Behavior normal.     Assessment & Plan:   See Encounters Tab for problem based charting.  Patient discussed with Dr. Jimmye Norman

## 2021-08-12 NOTE — Assessment & Plan Note (Signed)
Respiratory status stable.  No wheezing on exam.  -Refill Symbicort

## 2021-08-12 NOTE — Progress Notes (Signed)
Internal Medicine Clinic Attending  Case discussed with Dr. Nguyen  At the time of the visit.  We reviewed the resident's history and exam and pertinent patient test results.  I agree with the assessment, diagnosis, and plan of care documented in the resident's note. 

## 2021-08-12 NOTE — Assessment & Plan Note (Signed)
Patient is here for 2 weeks follow-up on his blood pressure.  Patient brought his blood pressure log with him.  Most of the values are in the 0000000 - [id number] systolic.  He has a few episodes of tachycardia up to 115.  He has 3 readings above 140.  Patient states that he only take his blood pressure medication when systolic higher than [id number].  Said that he feels tired when his blood pressure in the 110s.  States that he only took his medication 3 times last week.  Current regimen: Amlodipine 5 mg and valsartan 320 mg daily  Orthostatic vital sign was obtained in the clinic and came back negative.  His tachycardia might be related to his neck pain.  -Given his low readings at home, will stop amlodipine -Advised patient to take valsartan daily and continue checking his blood pressure for 2 weeks. -Advised patient to bring his blood pressure cuff with him next time to compare -Last BMP 3 months ago was normal.

## 2021-08-13 ENCOUNTER — Other Ambulatory Visit (HOSPITAL_COMMUNITY): Payer: Self-pay

## 2021-08-14 ENCOUNTER — Emergency Department (HOSPITAL_COMMUNITY)
Admission: EM | Admit: 2021-08-14 | Discharge: 2021-08-14 | Discharge status: Against Medical Advice | Payer: Medicare Other | Source: Home / Self Care | Attending: Emergency Medicine | Admitting: Emergency Medicine

## 2021-08-14 ENCOUNTER — Ambulatory Visit (HOSPITAL_COMMUNITY)
Admission: EM | Admit: 2021-08-14 | Discharge: 2021-08-14 | Discharge status: Against Medical Advice | Payer: Medicare Other | Attending: Emergency Medicine | Admitting: Emergency Medicine

## 2021-08-14 ENCOUNTER — Other Ambulatory Visit: Payer: Self-pay

## 2021-08-14 DIAGNOSIS — R1013 Epigastric pain: Secondary | ICD-10-CM | POA: Insufficient documentation

## 2021-08-14 LAB — I-STAT CREATININE, ED: Creatinine, Ser: 1 mg/dL (ref 0.61–1.24)

## 2021-08-14 IMAGING — CT CT ABD-PELV W/ CM
2 of 5 series · 16 of 46 positions shown, 18 images · IV contrast (omnipaque)
Comparison: [DATE]

CLINICAL DATA: Acute nonlocalized abdominal pain, epigastric pain,
history CHF, GERD, hypertension, alpha thalassemia, former smoker

EXAM:
CT ABDOMEN AND PELVIS WITH CONTRAST
TECHNIQUE: Multidetector CT imaging of the abdomen and pelvis was performed
using the standard protocol following bolus administration of
intravenous contrast. Sagittal and coronal MPR images reconstructed
from axial data set.
CONTRAST:  80mL OMNIPAQUE IOHEXOL 350 MG/ML SOLN IV. Dilute oral
contrast

[Series 2: axial st · axial · 0.71mm/px · z∈[-520,-125]mm · 13 of 93 slices shown, 15 images]
[im 7/93  soft-tissue]
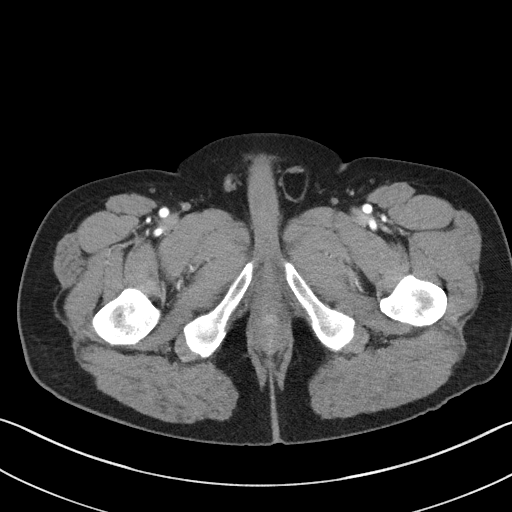
[im 7/93  bone]
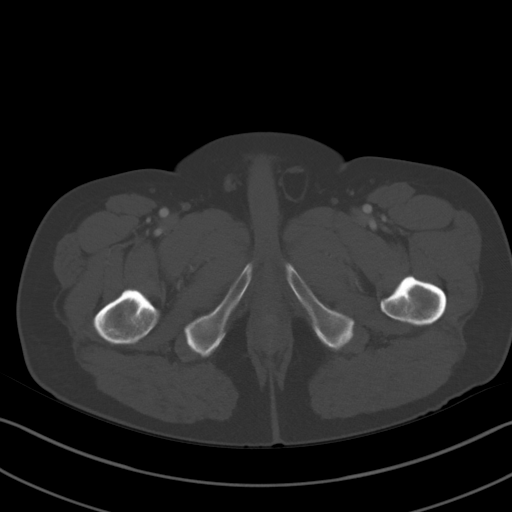
[im 14/93  soft-tissue]
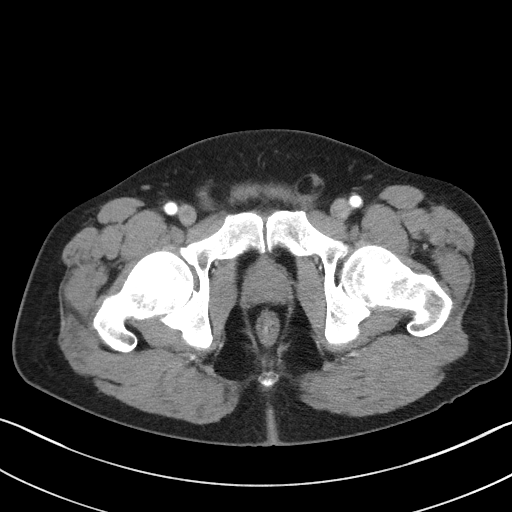
[im 20/93  soft-tissue]
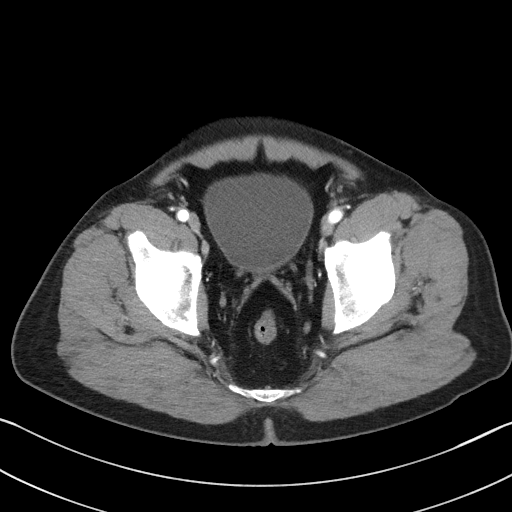
[im 27/93  soft-tissue]
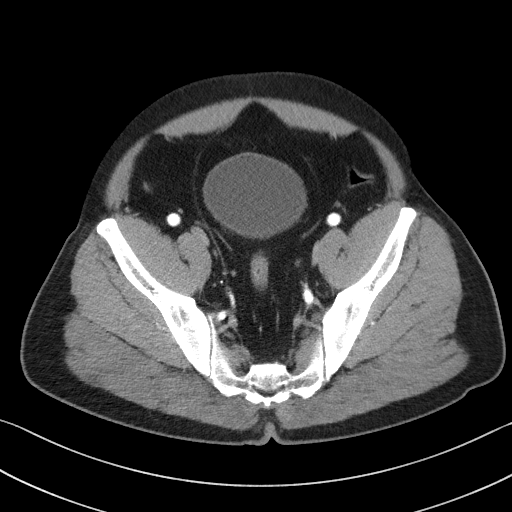
[im 33/93  soft-tissue]
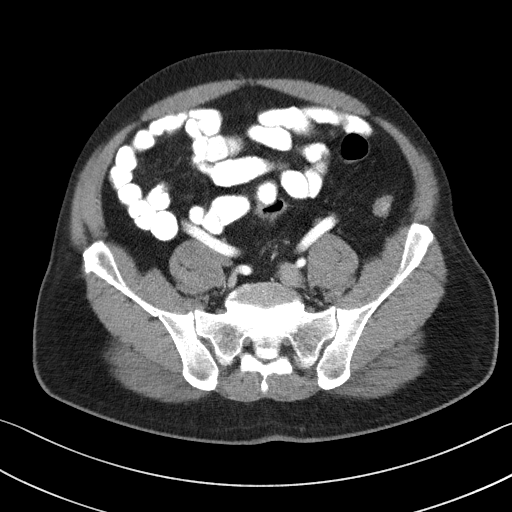
[im 40/93  soft-tissue]
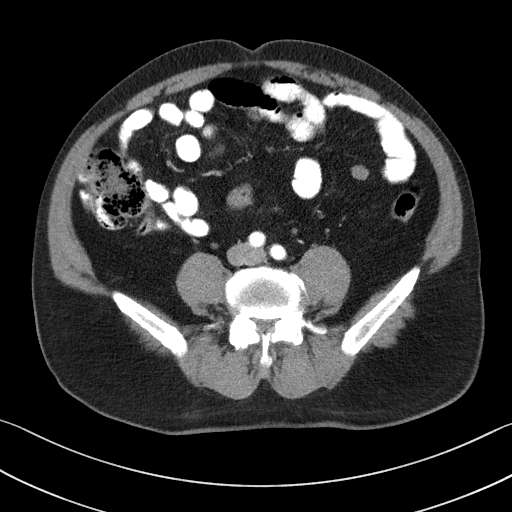
[im 47/93  soft-tissue]
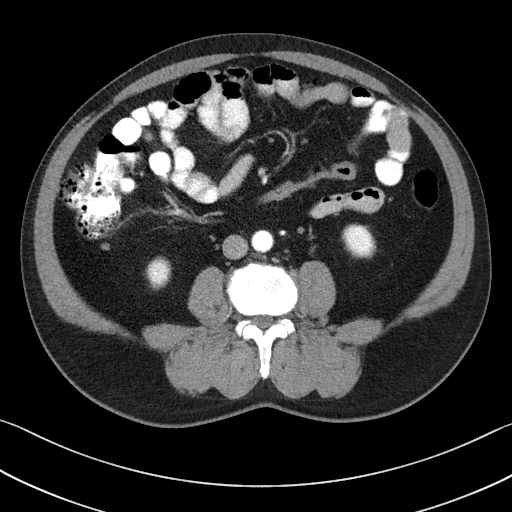
[im 53/93  soft-tissue]
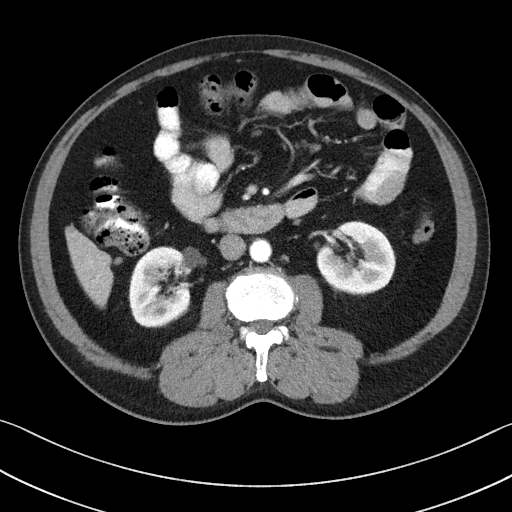
[im 60/93  soft-tissue]
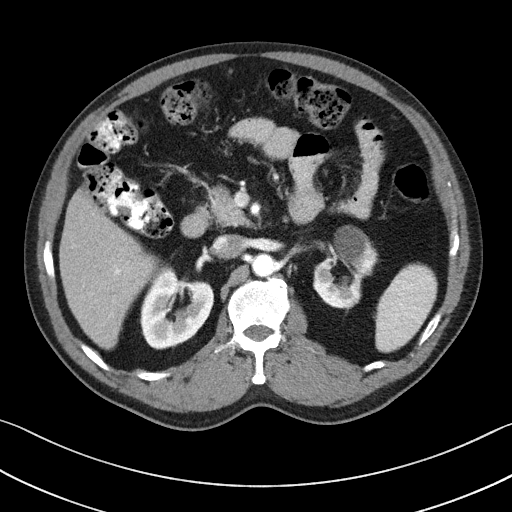
[im 60/93  bone]
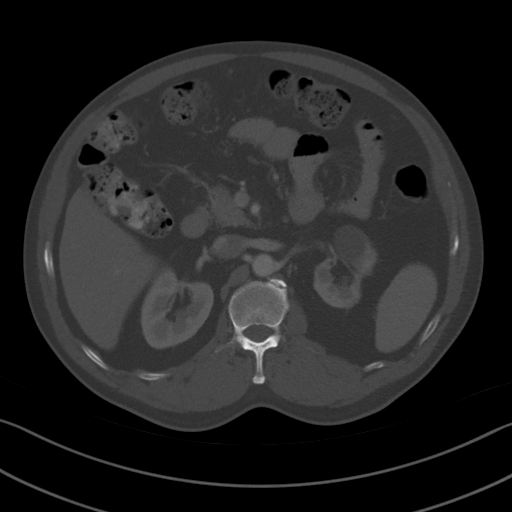
[im 66/93  soft-tissue]
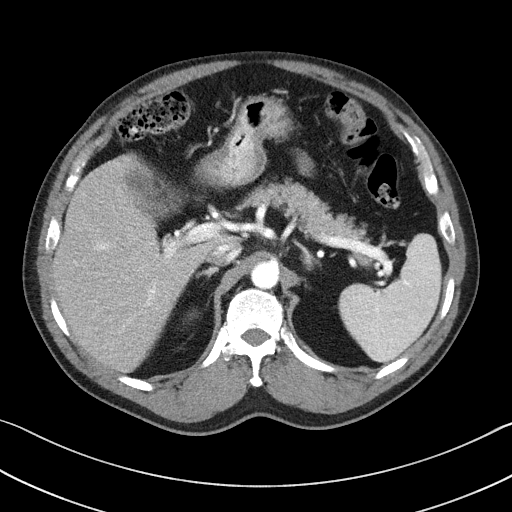
[im 73/93  soft-tissue]
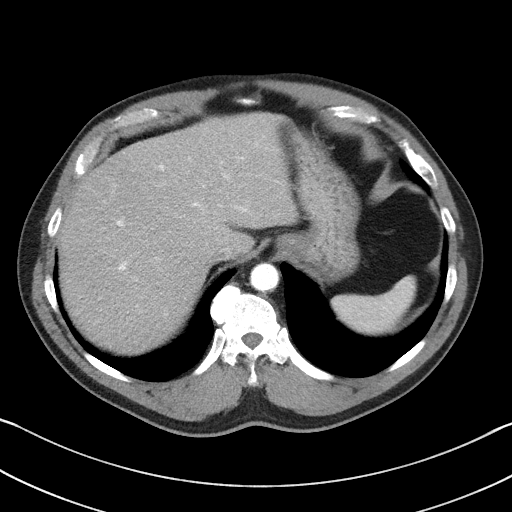
[im 79/93  soft-tissue]
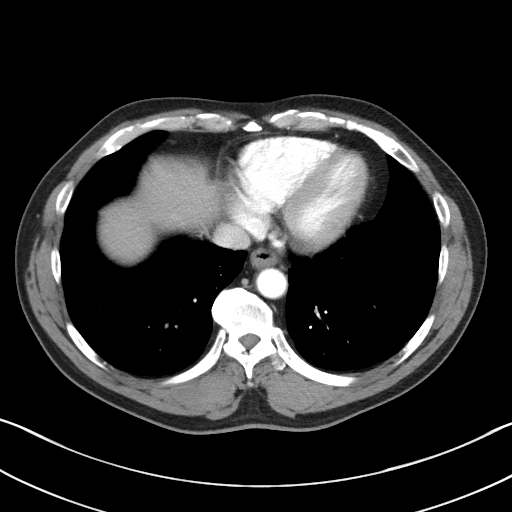
[im 86/93  soft-tissue]
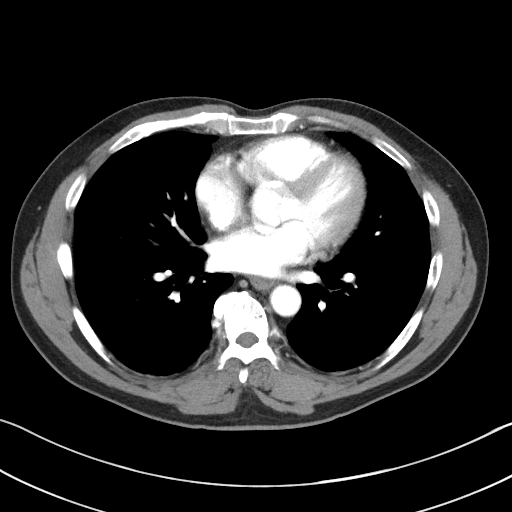

[Series 5: coronal st · coronal · 0.71mm/px · 3 of 96 slices shown]
[im 32/96  soft-tissue]
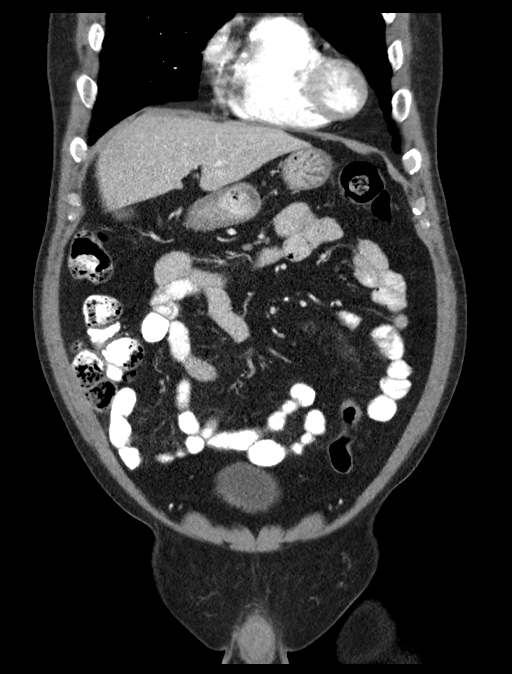
[im 43/96  soft-tissue]
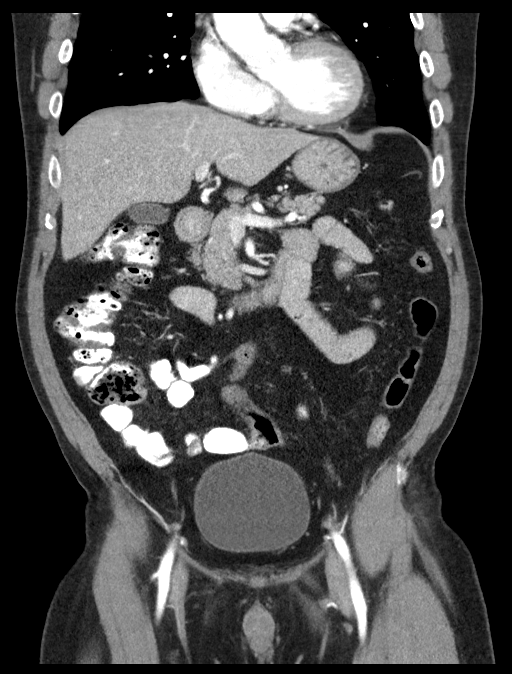
[im 53/96  soft-tissue]
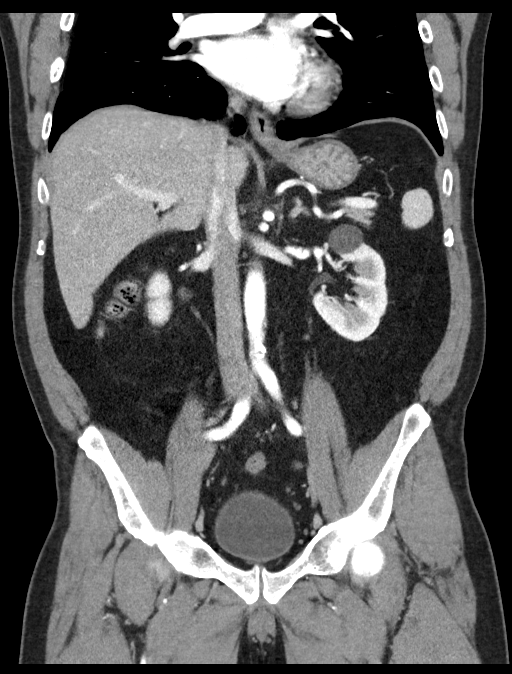

[16 of 46 positions shown; findings below may reference images not displayed]

FINDINGS: Lower chest: Lung bases clear

Hepatobiliary: Mild fatty infiltration of liver. Gallbladder and
liver otherwise normal appearance

Pancreas: Normal appearance

Spleen: Normal appearance

Adrenals/Urinary Tract: Adrenal glands normal appearance. BILATERAL
renal cysts, largest anteriorly at upper pole LEFT kidney 2.5 x
cm image 33. Cortical scarring at upper pole LEFT kidney. Kidneys,
ureters, and bladder otherwise normal appearance

Stomach/Bowel: Normal appendix. Stomach and bowel loops normal
appearance.

Vascular/Lymphatic: Vascular structures patent. Aorta normal caliber
with atherosclerotic calcifications. No adenopathy.

Reproductive: Unremarkable prostate gland and seminal vesicles

Other: No free air or free fluid. LEFT inguinal hernia containing
fat. No inflammatory process.

Musculoskeletal: No acute osseous findings.
IMPRESSION: BILATERAL renal cysts with upper pole scarring LEFT kidney.

Fatty infiltration of liver.

LEFT inguinal hernia containing fat.

No acute intra-abdominal or intrapelvic abnormalities.

Aortic Atherosclerosis ([DS]-[DS]).

## 2021-08-14 MED ORDER — IOHEXOL 9 MG/ML PO SOLN
500.0000 mL | ORAL | Status: AC
  Administered 2021-08-14 (×2): 500 mL via ORAL

## 2021-08-14 MED ORDER — IOHEXOL 350 MG/ML SOLN
80.0000 mL | Freq: Once | INTRAVENOUS | Status: AC | PRN
  Administered 2021-08-14: 80 mL via INTRAVENOUS

## 2021-08-15 ENCOUNTER — Other Ambulatory Visit (HOSPITAL_COMMUNITY): Payer: Self-pay

## 2021-08-25 ENCOUNTER — Other Ambulatory Visit (HOSPITAL_COMMUNITY): Payer: Self-pay

## 2021-08-29 ENCOUNTER — Ambulatory Visit (INDEPENDENT_AMBULATORY_CARE_PROVIDER_SITE_OTHER): Payer: Medicare Other | Admitting: Internal Medicine

## 2021-08-29 ENCOUNTER — Encounter: Payer: Self-pay | Admitting: Internal Medicine

## 2021-08-29 ENCOUNTER — Other Ambulatory Visit: Payer: Self-pay

## 2021-08-29 VITALS — BP 145/90 | HR 87 | Temp 98.2°F | Ht 62.0 in | Wt 152.9 lb

## 2021-08-29 DIAGNOSIS — M5412 Radiculopathy, cervical region: Secondary | ICD-10-CM

## 2021-08-29 NOTE — Progress Notes (Signed)
   CC: Blood pressure check   HPI:  [name] is a 60 y.o. person, with a PMH noted below, who presents to the clinic blood pressure check. To see the management of their acute and chronic conditions, please see the A&P note under the Encounters tab.   Past Medical History:  Diagnosis Date  . Anemia   . CHF (congestive heart failure) ([facility])   . Dental infection 08/02/2018  . GERD (gastroesophageal reflux disease)   . Hypertension   . Shingles rash 07/07/2019  . Thalassemia, alpha ([place])   . Viral gastritis 02/08/2019   Review of Systems:   Review of Systems  Constitutional:  Negative for chills, fever, malaise/fatigue and weight loss.  Cardiovascular:  Negative for chest pain.  Musculoskeletal:  Positive for neck pain.       Neck pain which spreads down the arms  Neurological:  Negative for dizziness, sensory change, focal weakness, seizures, weakness and headaches.    Physical Exam:  Vitals:   08/29/21 0945 08/29/21 1000  BP: (!) 141/83 (!) 145/90  Pulse: 92 87  Temp: 98.2 F (36.8 C)   TempSrc: Oral   SpO2: 97%   Weight: 152 lb 14.4 oz (69.4 kg)   Height: '5\' 2"'$  (1.575 m)    Physical Exam Vitals and nursing note reviewed.  Constitutional:      General: He is not in acute distress.    Appearance: Normal appearance. He is not ill-appearing or toxic-appearing.  HENT:     Head: Normocephalic and atraumatic.  Eyes:     General:        Right eye: No discharge.        Left eye: No discharge.     Conjunctiva/sclera: Conjunctivae normal.  Cardiovascular:     Rate and Rhythm: Normal rate and regular rhythm.     Pulses: Normal pulses.     Heart sounds: Normal heart sounds. No murmur heard.   No friction rub. No gallop.  Pulmonary:     Effort: Pulmonary effort is normal.     Breath sounds: Normal breath sounds. No wheezing, rhonchi or rales.  Abdominal:     General: Bowel sounds are normal.     Palpations: Abdomen is soft.     Tenderness: There is no abdominal  tenderness. There is no guarding.  Musculoskeletal:        General: No swelling.     Right lower leg: No edema.     Left lower leg: No edema.  Neurological:     General: No focal deficit present.     Mental Status: He is alert and oriented to person, place, and time.     Assessment & Plan:   See Encounters Tab for problem based charting.  Patient discussed with Dr. Jimmye Norman

## 2021-08-29 NOTE — Patient Instructions (Signed)
[name],   It was nice meeting you. Today we discussed your blood pressure and neck pain.   For your blood pressure, please continue taking your Diovan 320 mg daily. Your blood pressures look good.   For your neck pain, I will send a letter to the neurosurgeon to reschedule an office visit.   Have a good day, and you will see your primary care doctor, Eric Ingram in December.  Maudie Mercury, MD

## 2021-08-29 NOTE — Assessment & Plan Note (Signed)
Patient presented with complaints of his Cervical radiculopathy. He was meant to have an appointment on 08/22/21, but missed it. He has not heard back from the neurosurgeon.  - Place referral to neurosurgery

## 2021-09-01 NOTE — Progress Notes (Signed)
Internal Medicine Clinic Attending ? ?Case discussed with Dr. Winters  At the time of the visit.  We reviewed the resident?s history and exam and pertinent patient test results.  I agree with the assessment, diagnosis, and plan of care documented in the resident?s note.  ?

## 2021-09-18 ENCOUNTER — Other Ambulatory Visit (HOSPITAL_COMMUNITY): Payer: Self-pay

## 2021-09-19 ENCOUNTER — Other Ambulatory Visit (HOSPITAL_COMMUNITY): Payer: Self-pay

## 2021-09-26 ENCOUNTER — Other Ambulatory Visit (HOSPITAL_COMMUNITY): Payer: Self-pay

## 2021-09-30 ENCOUNTER — Other Ambulatory Visit (HOSPITAL_COMMUNITY): Payer: Self-pay

## 2021-10-01 ENCOUNTER — Other Ambulatory Visit (HOSPITAL_COMMUNITY): Payer: Self-pay

## 2021-10-01 ENCOUNTER — Other Ambulatory Visit: Payer: Self-pay | Admitting: Student

## 2021-10-01 DIAGNOSIS — J8410 Pulmonary fibrosis, unspecified: Secondary | ICD-10-CM

## 2021-10-01 MED ORDER — FLUTICASONE-SALMETEROL 500-50 MCG/ACT IN AEPB
1.0000 | INHALATION_SPRAY | Freq: Two times a day (BID) | RESPIRATORY_TRACT | 0 refills | Status: DC
  Filled 2021-10-01 – 2021-10-10 (×2): qty 60, 30d supply, fill #0

## 2021-10-01 NOTE — Progress Notes (Signed)
Symbicort is no longer covered under his insurance.  We will switch to Advair diskus per pharmacy advice.

## 2021-10-09 ENCOUNTER — Other Ambulatory Visit (HOSPITAL_COMMUNITY): Payer: Self-pay

## 2021-10-10 ENCOUNTER — Other Ambulatory Visit (HOSPITAL_COMMUNITY): Payer: Self-pay

## 2021-10-23 ENCOUNTER — Other Ambulatory Visit (HOSPITAL_COMMUNITY): Payer: Self-pay

## 2021-11-04 ENCOUNTER — Other Ambulatory Visit: Payer: Self-pay | Admitting: Neurosurgery

## 2021-11-14 ENCOUNTER — Other Ambulatory Visit (HOSPITAL_COMMUNITY): Payer: Self-pay

## 2021-11-14 ENCOUNTER — Other Ambulatory Visit: Payer: Self-pay | Admitting: Student

## 2021-11-14 DIAGNOSIS — M5412 Radiculopathy, cervical region: Secondary | ICD-10-CM

## 2021-11-14 MED ORDER — DULOXETINE HCL 60 MG PO CPEP
60.0000 mg | ORAL_CAPSULE | Freq: Every day | ORAL | 3 refills | Status: DC
  Filled 2021-11-14: qty 30, 30d supply, fill #0
  Filled 2022-01-06: qty 30, 30d supply, fill #1
  Filled 2022-02-06: qty 30, 30d supply, fill #2
  Filled 2022-03-06: qty 30, 30d supply, fill #3

## 2021-11-19 ENCOUNTER — Encounter: Payer: Self-pay | Admitting: Student

## 2021-11-19 ENCOUNTER — Ambulatory Visit (INDEPENDENT_AMBULATORY_CARE_PROVIDER_SITE_OTHER): Payer: Medicare Other | Admitting: Student

## 2021-11-19 ENCOUNTER — Other Ambulatory Visit: Payer: Self-pay

## 2021-11-19 ENCOUNTER — Other Ambulatory Visit (HOSPITAL_COMMUNITY): Payer: Self-pay

## 2021-11-19 VITALS — BP 149/103 | HR 100 | Temp 98.2°F | Wt 152.8 lb

## 2021-11-19 DIAGNOSIS — Z0000 Encounter for general adult medical examination without abnormal findings: Secondary | ICD-10-CM

## 2021-11-19 DIAGNOSIS — I10 Essential (primary) hypertension: Secondary | ICD-10-CM | POA: Diagnosis present

## 2021-11-19 DIAGNOSIS — Z23 Encounter for immunization: Secondary | ICD-10-CM

## 2021-11-19 DIAGNOSIS — M5412 Radiculopathy, cervical region: Secondary | ICD-10-CM

## 2021-11-19 DIAGNOSIS — R1013 Epigastric pain: Secondary | ICD-10-CM | POA: Diagnosis not present

## 2021-11-19 DIAGNOSIS — K219 Gastro-esophageal reflux disease without esophagitis: Secondary | ICD-10-CM | POA: Diagnosis not present

## 2021-11-19 MED ORDER — AMLODIPINE BESYLATE 5 MG PO TABS
5.0000 mg | ORAL_TABLET | Freq: Every day | ORAL | 11 refills | Status: DC
  Filled 2021-11-19: qty 30, 30d supply, fill #0
  Filled 2022-01-06: qty 30, 30d supply, fill #1
  Filled 2022-02-06: qty 30, 30d supply, fill #2
  Filled 2022-03-06: qty 30, 30d supply, fill #3
  Filled 2022-04-06: qty 30, 30d supply, fill #4
  Filled 2022-05-13: qty 30, 30d supply, fill #5
  Filled 2022-06-17: qty 30, 30d supply, fill #6
  Filled 2022-07-24: qty 30, 30d supply, fill #7
  Filled 2022-08-24: qty 30, 30d supply, fill #8
  Filled 2022-09-23: qty 30, 30d supply, fill #9
  Filled 2022-10-23: qty 30, 30d supply, fill #10
  Filled 2022-11-16: qty 30, 30d supply, fill #11

## 2021-11-19 MED ORDER — POLYETHYLENE GLYCOL 3350 17 G PO PACK
17.0000 g | PACK | Freq: Every day | ORAL | 0 refills | Status: DC
  Filled 2021-11-19: qty 28, 28d supply, fill #0

## 2021-11-19 MED ORDER — SIMETHICONE 80 MG PO CHEW
80.0000 mg | CHEWABLE_TABLET | Freq: Four times a day (QID) | ORAL | 0 refills | Status: DC | PRN
  Filled 2021-11-19: qty 30, 8d supply, fill #0

## 2021-11-19 NOTE — Assessment & Plan Note (Addendum)
Patient was evaluated by Dr. Saintclair Halsted with neurosurgery. He has a anterior cervical discectomy and fusion of C4-5 and C5-6 scheduled in January 2023.   -Continue Cymbalta and gabapentin

## 2021-11-19 NOTE — Assessment & Plan Note (Signed)
Pneumonia shot given today

## 2021-11-19 NOTE — Assessment & Plan Note (Signed)
Blood pressure elevated today at 149/103.  States that his blood pressure is elevated due to his abdominal discomfort.  He does check his blood pressure at home, report low-dose at 110-120 and highs at 160-170.  He reports adherence to valsartan 320 mg.  He did not have abdominal discomfort when I rechecked his blood pressure and it was elevated at 149/93.  Discussed with patient regarding adding another agent.  Patient agreed with the plan.  -Add amlodipine 5 mg -Continue valsartan 320 mg -Follow-up in 4 weeks for blood pressure recheck

## 2021-11-19 NOTE — Assessment & Plan Note (Addendum)
Patient endorses persistent symptoms of abdominal bloating that is better with bowel movements.  Also endorses symptoms of acid reflux.  He does not know which type of food triggers the discomfort.  States that he was eating beef the other day and that triggered his symptoms.  Also endorses small caliber stool.  States that he had 3 bowel movements a day.  States that sometimes his stool was black but denies hematochezia.  He was supposed to be on Protonix 40 mg twice daily but only take once daily.  He was prescribed MiraLAX but did not pick up the prescription.  Assessment and plan  He has similar presentation at last visit.  CT abdomen pelvis was ordered which came back unremarkable.  He has a unremarkable colonoscopy in 2019.  EGD in 2019 showed gastritis with negative for H. Pylori.  Suspect his symptoms are related to GERD with gas pain.  Could also be IBS but need to rule out other causes.  Advised patient to take Protonix twice daily.  I added MiraLAX to help with his bowel movement and simethicone as needed for gas pain.  Will have patient follow-up in 1 month.  If his symptoms do not improve, we will have him follow-up with GI for further evaluation.

## 2021-11-19 NOTE — Progress Notes (Signed)
   CC: abdominal pain  HPI:  [name] is a 60 y.o. with past medical history of hypertension, post-COVID pulmonary fibrosis, cervical radiculopathy who presented to the clinic today for chief complaining of abdominal pain and bloating.  Please see his problem based charting for detailed  Past Medical History:  Diagnosis Date   Anemia    CHF (congestive heart failure) ([place])    Dental infection 08/02/2018   GERD (gastroesophageal reflux disease)    Hypertension    Shingles rash 07/07/2019   Thalassemia, alpha ([place])    Viral gastritis 02/08/2019   Review of Systems:  per HPI  Physical Exam:  Vitals:   11/19/21 0843  BP: (!) 149/103  Pulse: 100  Temp: 98.2 F (36.8 C)  TempSrc: Oral  SpO2: 99%  Weight: 152 lb 12.8 oz (69.3 kg)   Physical Exam Constitutional:      General: He is not in acute distress.    Appearance: He is not ill-appearing.  HENT:     Head: Normocephalic.  Eyes:     General:        Right eye: No discharge.        Left eye: No discharge.     Conjunctiva/sclera: Conjunctivae normal.  Cardiovascular:     Rate and Rhythm: Normal rate and regular rhythm.     Heart sounds: Normal heart sounds.     Comments: No LE edema Pulmonary:     Effort: Pulmonary effort is normal. No respiratory distress.     Breath sounds: Normal breath sounds. No wheezing.  Abdominal:     General: Bowel sounds are normal.     Comments: Abdomen soft, nontender to palpation.  Bowel sounds heard in all 4 quadrants.  No guarding.   Musculoskeletal:        General: Normal range of motion.  Skin:    General: Skin is warm.     Coloration: Skin is not jaundiced.  Neurological:     General: No focal deficit present.     Mental Status: He is alert and oriented to person, place, and time.  Psychiatric:        Mood and Affect: Mood normal.        Behavior: Behavior normal.     Assessment & Plan:   See Encounters Tab for problem based charting.  Patient discussed with Dr.  Saverio Danker

## 2021-11-19 NOTE — Patient Instructions (Signed)
[name],  It was a pleasure seeing you in the clinic today.  Here is a summary of what we talked about:  1.  Abdominal pain: This is likely acid reflux with gas pain.  Please take Protonix 2 times daily.  I added MiraLAX to help with a bowel movement and simethicone for gas pain.  Please try to avoid foods that can worsen your pain.  2.  High blood pressure: It is still elevated today at 149/93.  I will add amlodipine 5 mg to your regimen.  Please continue valsartan.  3.  Neck pain: Please follow-up with your neuro surgeon.  Please return in 4 weeks  Take care,  Dr. Alfonse Spruce

## 2021-11-20 ENCOUNTER — Other Ambulatory Visit (HOSPITAL_COMMUNITY): Payer: Self-pay

## 2021-11-20 NOTE — Progress Notes (Signed)
Internal Medicine Clinic Attending  Case discussed with Dr. Nguyen  At the time of the visit.  We reviewed the resident's history and exam and pertinent patient test results.  I agree with the assessment, diagnosis, and plan of care documented in the resident's note. 

## 2021-12-11 ENCOUNTER — Other Ambulatory Visit (HOSPITAL_COMMUNITY): Payer: Self-pay

## 2021-12-11 ENCOUNTER — Other Ambulatory Visit: Payer: Self-pay | Admitting: Student

## 2021-12-11 ENCOUNTER — Encounter (HOSPITAL_COMMUNITY)
Admission: RE | Admit: 2021-12-11 | Discharge: 2021-12-11 | Disposition: A | Discharge status: Home / Self Care | Payer: Medicare Other | Source: Ambulatory Visit | Attending: Neurosurgery | Admitting: Neurosurgery

## 2021-12-11 ENCOUNTER — Other Ambulatory Visit: Payer: Self-pay

## 2021-12-11 ENCOUNTER — Encounter (HOSPITAL_COMMUNITY): Payer: Self-pay

## 2021-12-11 VITALS — BP 133/93 | HR 106 | Temp 98.2°F | Resp 17 | Ht 62.0 in | Wt 155.0 lb

## 2021-12-11 DIAGNOSIS — Z01818 Encounter for other preprocedural examination: Secondary | ICD-10-CM

## 2021-12-11 DIAGNOSIS — K219 Gastro-esophageal reflux disease without esophagitis: Secondary | ICD-10-CM

## 2021-12-11 DIAGNOSIS — I10 Essential (primary) hypertension: Secondary | ICD-10-CM | POA: Insufficient documentation

## 2021-12-11 DIAGNOSIS — Z01812 Encounter for preprocedural laboratory examination: Secondary | ICD-10-CM | POA: Insufficient documentation

## 2021-12-11 DIAGNOSIS — M5412 Radiculopathy, cervical region: Secondary | ICD-10-CM

## 2021-12-11 LAB — CBC
HCT: 38.2 % — ABNORMAL LOW (ref 39.0–52.0)
Hemoglobin: 12.4 g/dL — ABNORMAL LOW (ref 13.0–17.0)
MCH: 22.6 pg — ABNORMAL LOW (ref 26.0–34.0)
MCHC: 32.5 g/dL (ref 30.0–36.0)
MCV: 69.7 fL — ABNORMAL LOW (ref 80.0–100.0)
Platelets: 381 10*3/uL (ref 150–400)
RBC: 5.48 MIL/uL (ref 4.22–5.81)
RDW: 14.6 % (ref 11.5–15.5)
WBC: 11.4 10*3/uL — ABNORMAL HIGH (ref 4.0–10.5)
nRBC: 0.2 % (ref 0.0–0.2)

## 2021-12-11 LAB — BASIC METABOLIC PANEL
Anion gap: 7 (ref 5–15)
BUN: 11 mg/dL (ref 6–20)
CO2: 25 mmol/L (ref 22–32)
Calcium: 8.4 mg/dL — ABNORMAL LOW (ref 8.9–10.3)
Chloride: 102 mmol/L (ref 98–111)
Creatinine, Ser: 1.15 mg/dL (ref 0.61–1.24)
GFR, Estimated: >60 mL/min (ref >60)
Glucose, Bld: 93 mg/dL (ref 70–99)
Potassium: 3.7 mmol/L (ref 3.5–5.1)
Sodium: 134 mmol/L — ABNORMAL LOW (ref 135–145)

## 2021-12-11 LAB — SURGICAL PCR SCREEN
MRSA, PCR: NEGATIVE
Staphylococcus aureus: NEGATIVE

## 2021-12-11 MED ORDER — PANTOPRAZOLE SODIUM 40 MG PO TBEC
40.0000 mg | DELAYED_RELEASE_TABLET | Freq: Two times a day (BID) | ORAL | 2 refills | Status: DC
  Filled 2021-12-11: qty 60, 30d supply, fill #0
  Filled 2022-01-23: qty 60, 30d supply, fill #1
  Filled 2022-03-06: qty 60, 30d supply, fill #2

## 2021-12-11 MED ORDER — GABAPENTIN 300 MG PO CAPS
900.0000 mg | ORAL_CAPSULE | Freq: Three times a day (TID) | ORAL | 2 refills | Status: DC
  Filled 2021-12-11: qty 270, 30d supply, fill #0
  Filled 2022-01-23: qty 270, 30d supply, fill #1
  Filled 2022-03-06: qty 270, 30d supply, fill #2

## 2021-12-11 NOTE — Pre-Procedure Instructions (Signed)
Surgical Instructions    Your procedure is scheduled on Friday, December 19, 2021 at 7:30 AM.  Report to [facility] Main Entrance "A" at 5:30 A.M., then check in with the Admitting office.  Call this number if you have problems the morning of surgery:  [phone]   If you have any questions prior to your surgery date call [phone]: Open Monday-Friday 8am-4pm    Remember:  Do not eat or drink after midnight the night before your surgery    Take these medicines the morning of surgery with A SIP OF WATER:  amLODipine (NORVASC) atorvastatin (LIPITOR) DULoxetine (CYMBALTA) gabapentin (NEURONTIN)  pantoprazole (PROTONIX) cyclobenzaprine (FLEXERIL) - if needed  As of today, STOP taking any Aspirin (unless otherwise instructed by your surgeon) Aleve, Naproxen, Ibuprofen, Motrin, Advil, Goody's, BC's, all herbal medications, fish oil, and all vitamins.                     Do NOT Smoke (Tobacco/Vaping) or drink Alcohol 24 hours prior to your procedure.  If you use a CPAP at night, you may bring all equipment for your overnight stay.   Contacts, glasses, piercing's, hearing aid's, dentures or partials may not be worn into surgery, please bring cases for these belongings.    For patients admitted to the hospital, discharge time will be determined by your treatment team.   Patients discharged the day of surgery will not be allowed to drive home, and someone needs to stay with them for 24 hours.  NO VISITORS WILL BE ALLOWED IN PRE-OP WHERE PATIENTS GET READY FOR SURGERY.  ONLY 1 SUPPORT PERSON MAY BE PRESENT IN THE WAITING ROOM WHILE YOU ARE IN SURGERY.  IF YOU ARE TO BE ADMITTED, ONCE YOU ARE IN YOUR ROOM YOU WILL BE ALLOWED TWO (2) VISITORS.  Minor children may have two parents present. Special consideration for safety and communication needs will be reviewed on a case by case basis.   Special instructions:   [place]- Preparing For Surgery  Before surgery, you can play an  important role. Because skin is not sterile, your skin needs to be as free of germs as possible. You can reduce the number of germs on your skin by washing with CHG (chlorahexidine gluconate) Soap before surgery.  CHG is an antiseptic cleaner which kills germs and bonds with the skin to continue killing germs even after washing.    Oral Hygiene is also important to reduce your risk of infection.  Remember - BRUSH YOUR TEETH THE MORNING OF SURGERY WITH YOUR REGULAR TOOTHPASTE  Please do not use if you have an allergy to CHG or antibacterial soaps. If your skin becomes reddened/irritated stop using the CHG.  Do not shave (including legs and underarms) for at least 48 hours prior to first CHG shower. It is OK to shave your face.  Please follow these instructions carefully.   Shower the NIGHT BEFORE SURGERY and the MORNING OF SURGERY  If you chose to wash your hair, wash your hair first as usual with your normal shampoo.  After you shampoo, rinse your hair and body thoroughly to remove the shampoo.  Use CHG Soap as you would any other liquid soap. You can apply CHG directly to the skin and wash gently with a scrungie or a clean washcloth.   Apply the CHG Soap to your body ONLY FROM THE NECK DOWN.  Do not use on open wounds or open sores. Avoid contact with your eyes, ears, mouth and genitals (  private parts). Wash Face and genitals (private parts)  with your normal soap.   Wash thoroughly, paying special attention to the area where your surgery will be performed.  Thoroughly rinse your body with warm water from the neck down.  DO NOT shower/wash with your normal soap after using and rinsing off the CHG Soap.  Pat yourself dry with a CLEAN TOWEL.  Wear CLEAN PAJAMAS to bed the night before surgery  Place CLEAN SHEETS on your bed the night before your surgery  DO NOT SLEEP WITH PETS.   Day of Surgery: Shower with CHG soap. Do not wear jewelry. Do not wear lotions, powders, colognes, or  deodorant. Do not shave 48 hours prior to surgery.  Men may shave face and neck. Do not bring valuables to the hospital. [facility] is not responsible for any belongings or valuables. Wear Clean/Comfortable clothing the morning of surgery Remember to brush your teeth WITH YOUR REGULAR TOOTHPASTE.   Please read over the following fact sheets that you were given.   3 days prior to your procedure or After your COVID test   You are not required to quarantine however you are required to wear a well-fitting mask when you are out and around people not in your household. If your mask becomes wet or soiled, replace with a new one.   Wash your hands often with soap and water for 20 seconds or clean your hands with an alcohol-based hand sanitizer that contains at least 60% alcohol.   Do not share personal items.   Notify your provider:  o if you are in close contact with someone who has COVID  o or if you develop a fever of 100.4 or greater, sneezing, cough, sore throat, shortness of breath or body aches.

## 2021-12-11 NOTE — Progress Notes (Signed)
PCP - Dr. Gaylan Gerold Cardiologist - Dr. Aldine Contes  PPM/ICD - Denies  Chest x-ray - N/A EKG - 01/31/21 Stress Test - 02/09/18 ECHO - 02/28/21 Cardiac Cath - Denies  Sleep Study - Denies  Diabetic: Denies  Blood Thinner Instructions: N/A Aspirin Instructions: N/A  ERAS Protcol - No  COVID TEST- Scheduled 12/16/20 at 1020  Patient speaks [place]; Needs interpreter   Anesthesia review: Yes, previous EKG abnormal  Patient denies shortness of breath, fever, cough and chest pain at PAT appointment   All instructions explained to the patient, with a verbal understanding of the material. Patient agrees to go over the instructions while at home for a better understanding. Patient also instructed to self quarantine after being tested for COVID-19. The opportunity to ask questions was provided.

## 2021-12-12 NOTE — Anesthesia Preprocedure Evaluation (Addendum)
Anesthesia Evaluation  Patient identified by MRN, date of birth, ID band Patient awake    Reviewed: Allergy & Precautions, NPO status , Patient's Chart, lab work & pertinent test results  Airway Mallampati: II  TM Distance: >3 FB Neck ROM: Limited    Dental  (+) Dental Advisory Given, Poor Dentition, Missing, Chipped   Pulmonary asthma , former smoker,    Pulmonary exam normal breath sounds clear to auscultation       Cardiovascular hypertension, Pt. on medications +CHF  Normal cardiovascular exam Rhythm:Regular Rate:Normal     Neuro/Psych Cervical myelopathy  Neuromuscular disease negative psych ROS   GI/Hepatic GERD  Medicated,  Endo/Other  Hyperthyroidism   Renal/GU      Musculoskeletal  (+) Arthritis ,   Abdominal   Peds  Hematology  (+) Blood dyscrasia, anemia , Alpha thalassemia   Anesthesia Other Findings   Reproductive/Obstetrics                           Anesthesia Physical Anesthesia Plan  ASA: 3  Anesthesia Plan: General   Post-op Pain Management: Tylenol PO (pre-op)   Induction: Intravenous  PONV Risk Score and Plan: 3 and Midazolam, Dexamethasone and Ondansetron  Airway Management Planned: Video Laryngoscope Planned and Oral ETT  Additional Equipment:   Intra-op Plan:   Post-operative Plan: Extubation in OR  Informed Consent: I have reviewed the patients History and Physical, chart, labs and discussed the procedure including the risks, benefits and alternatives for the proposed anesthesia with the patient or authorized representative who has indicated his/her understanding and acceptance.     Dental advisory given and Interpreter used for interveiw  Plan Discussed with: CRNA  Anesthesia Plan Comments: (PAT note by Karoline Caldwell, PA-C: Patient has been previously evaluated by cardiology for precordial pain.  Work-up benign.  Last seen by Dr. Gardiner Rhyme 03/07/2021.   Per note, "Chest pain:Coronary CTA on 11/27/2019 showed calcium score 0, normal coronary arteries.Echocardiogram 02/28/2021 showed normal biventricular function, no significant valvular disease. -No further cardiac work-up recommended."  Patient was admitted to [facility] March 2021 with COVID-19 and treated with high flow nasal cannula, dexamethasone, and remdesivir.  He subsequently had multiple ED admissions and readmission to the hospital with respiratory complaints.  Subsequent CT scan revealed evidence of postinflammatory fibrosis and ILD.  He was last seen by pulmonology 11/29/2020 and per note was having no significant respiratory symptoms at that time.  From respiratory standpoint, he was advised to continue current inhaler regimen, use albuterol as needed, and follow-up with primary care for further management.  Per med list, he is currently maintained on Advair.  Preop labs reviewed, unremarkable.  EKG 01/31/2021: Sinus tachycardia.  Rate 108.  Inferior infarct, age undetermined.  No significant change.  CTA chest 04/18/2021: IMPRESSION: 1. No evidence of pulmonary embolism. 2. Ground-glass seen on the previous study may be slightly improved, changes of subpleural reticulation are similar accounting for motion seen on the prior study. No acute pulmonary findings. 3. Stable 4 mm RIGHT upper lobe pulmonary nodule. Consider follow-up at 12 months for high risk patient from the previous evaluation.  TTE 02/28/2021: 1. Left ventricular ejection fraction, by estimation, is 60 to 65%. The  left ventricle has normal function. The left ventricle has no regional  wall motion abnormalities. There is mild left ventricular hypertrophy.  Left ventricular diastolic parameters  are consistent with Grade I diastolic dysfunction (impaired relaxation).  2. Right ventricular systolic function is normal. The  right ventricular  size is normal. Tricuspid regurgitation signal is inadequate for assessing  PA  pressure.  3. The mitral valve is grossly normal. Trivial mitral valve  regurgitation.  4. The aortic valve is tricuspid. Aortic valve regurgitation is not  visualized.  5. The inferior vena cava is normal in size with greater than 50%  respiratory variability, suggesting right atrial pressure of 3 mmHg.   Comparison(s): Changes from prior study are noted. 11/01/2019: LVEF  60-65%, no LVH.   Coronary CT 11/27/2019: IMPRESSION: 1. Coronary calcium score of 0. This was 0 percentile for age and sex matched control.  2. Normal coronary origin with right dominance.  3. No evidence of CAD.  4. Step artifact precludes interpretation of a small branch off of OM1  )      Anesthesia Quick Evaluation

## 2021-12-12 NOTE — Progress Notes (Signed)
Anesthesia Chart Review:  Patient has been previously evaluated by cardiology for precordial pain.  Work-up benign.  Last seen by Dr. Gardiner Rhyme 03/07/2021.  Per note, "Chest pain: Coronary CTA on 11/27/2019 showed calcium score 0, normal coronary arteries.  Echocardiogram 02/28/2021 showed normal biventricular function, no significant valvular disease. -No further cardiac work-up recommended."  Patient was admitted to [facility] March 2021 with COVID-19 and treated with high flow nasal cannula, dexamethasone, and remdesivir.  He subsequently had multiple ED admissions and readmission to the hospital with respiratory complaints.  Subsequent CT scan revealed evidence of postinflammatory fibrosis and ILD.  He was last seen by pulmonology 11/29/2020 and per note was having no significant respiratory symptoms at that time.  From respiratory standpoint, he was advised to continue current inhaler regimen, use albuterol as needed, and follow-up with primary care for further management.  Per med list, he is currently maintained on Advair.  Preop labs reviewed, unremarkable.  EKG 01/31/2021: Sinus tachycardia.  Rate 108.  Inferior infarct, age undetermined.  No significant change.  CTA chest 04/18/2021: IMPRESSION: 1. No evidence of pulmonary embolism. 2. Ground-glass seen on the previous study may be slightly improved, changes of subpleural reticulation are similar accounting for motion seen on the prior study. No acute pulmonary findings. 3. Stable 4 mm RIGHT upper lobe pulmonary nodule. Consider follow-up at 12 months for high risk patient from the previous evaluation.   TTE 02/28/2021: 1. Left ventricular ejection fraction, by estimation, is 60 to 65%. The  left ventricle has normal function. The left ventricle has no regional  wall motion abnormalities. There is mild left ventricular hypertrophy.  Left ventricular diastolic parameters  are consistent with Grade I diastolic dysfunction (impaired relaxation).    2. Right ventricular systolic function is normal. The right ventricular  size is normal. Tricuspid regurgitation signal is inadequate for assessing  PA pressure.   3. The mitral valve is grossly normal. Trivial mitral valve  regurgitation.   4. The aortic valve is tricuspid. Aortic valve regurgitation is not  visualized.   5. The inferior vena cava is normal in size with greater than 50%  respiratory variability, suggesting right atrial pressure of 3 mmHg.   Comparison(s): Changes from prior study are noted. 11/01/2019: LVEF  60-65%, no LVH.   Coronary CT 11/27/2019: IMPRESSION: 1. Coronary calcium score of 0. This was 0 percentile for age and sex matched control.   2. Normal coronary origin with right dominance.   3. No evidence of CAD.   4. Step artifact precludes interpretation of a small branch off of [place], PA-C [facility] Stay [facility]/Anesthesiology Phone [phone] 12/12/2021 11:30 AM

## 2021-12-16 ENCOUNTER — Other Ambulatory Visit (HOSPITAL_COMMUNITY)
Admission: RE | Admit: 2021-12-16 | Discharge: 2021-12-16 | Disposition: A | Discharge status: Home / Self Care | Payer: Medicare Other | Source: Ambulatory Visit | Attending: Neurosurgery | Admitting: Neurosurgery

## 2021-12-16 DIAGNOSIS — Z01812 Encounter for preprocedural laboratory examination: Secondary | ICD-10-CM | POA: Insufficient documentation

## 2021-12-16 DIAGNOSIS — Z20822 Contact with and (suspected) exposure to covid-19: Secondary | ICD-10-CM | POA: Insufficient documentation

## 2021-12-16 DIAGNOSIS — Z01818 Encounter for other preprocedural examination: Secondary | ICD-10-CM

## 2021-12-16 LAB — SARS CORONAVIRUS 2 (TAT 6-24 HRS): SARS Coronavirus 2: NEGATIVE

## 2021-12-19 ENCOUNTER — Other Ambulatory Visit: Payer: Self-pay

## 2021-12-19 ENCOUNTER — Ambulatory Visit (HOSPITAL_COMMUNITY): Payer: Medicare Other

## 2021-12-19 ENCOUNTER — Other Ambulatory Visit (HOSPITAL_COMMUNITY): Payer: Self-pay

## 2021-12-19 ENCOUNTER — Ambulatory Visit (HOSPITAL_COMMUNITY): Payer: Medicare Other | Admitting: Anesthesiology

## 2021-12-19 ENCOUNTER — Ambulatory Visit (HOSPITAL_COMMUNITY): Payer: Medicare Other | Admitting: Physician Assistant

## 2021-12-19 ENCOUNTER — Encounter (HOSPITAL_COMMUNITY)
Admission: RE | Disposition: A | Discharge status: Home / Self Care | Payer: Self-pay | Source: Ambulatory Visit | Attending: Neurosurgery

## 2021-12-19 ENCOUNTER — Ambulatory Visit (HOSPITAL_COMMUNITY)
Admission: RE | Admit: 2021-12-19 | Discharge: 2021-12-19 | Disposition: A | Discharge status: Home / Self Care | Payer: Medicare Other | Source: Ambulatory Visit | Attending: Neurosurgery | Admitting: Neurosurgery

## 2021-12-19 ENCOUNTER — Encounter (HOSPITAL_COMMUNITY): Payer: Self-pay | Admitting: Neurosurgery

## 2021-12-19 DIAGNOSIS — G959 Disease of spinal cord, unspecified: Secondary | ICD-10-CM | POA: Diagnosis present

## 2021-12-19 DIAGNOSIS — R531 Weakness: Secondary | ICD-10-CM | POA: Diagnosis not present

## 2021-12-19 DIAGNOSIS — E0590 Thyrotoxicosis, unspecified without thyrotoxic crisis or storm: Secondary | ICD-10-CM | POA: Diagnosis not present

## 2021-12-19 DIAGNOSIS — J45909 Unspecified asthma, uncomplicated: Secondary | ICD-10-CM | POA: Diagnosis not present

## 2021-12-19 DIAGNOSIS — Z87891 Personal history of nicotine dependence: Secondary | ICD-10-CM | POA: Insufficient documentation

## 2021-12-19 DIAGNOSIS — M4712 Other spondylosis with myelopathy, cervical region: Secondary | ICD-10-CM | POA: Diagnosis not present

## 2021-12-19 DIAGNOSIS — R262 Difficulty in walking, not elsewhere classified: Secondary | ICD-10-CM | POA: Insufficient documentation

## 2021-12-19 DIAGNOSIS — I509 Heart failure, unspecified: Secondary | ICD-10-CM | POA: Diagnosis not present

## 2021-12-19 DIAGNOSIS — K219 Gastro-esophageal reflux disease without esophagitis: Secondary | ICD-10-CM | POA: Insufficient documentation

## 2021-12-19 DIAGNOSIS — Z419 Encounter for procedure for purposes other than remedying health state, unspecified: Secondary | ICD-10-CM

## 2021-12-19 DIAGNOSIS — I110 Hypertensive heart disease with heart failure: Secondary | ICD-10-CM | POA: Insufficient documentation

## 2021-12-19 DIAGNOSIS — M4802 Spinal stenosis, cervical region: Secondary | ICD-10-CM | POA: Diagnosis not present

## 2021-12-19 HISTORY — PX: ANTERIOR CERVICAL DECOMP/DISCECTOMY FUSION: SHX1161

## 2021-12-19 IMAGING — RF DG CERVICAL SPINE 1V
1 series · 2 of 2 positions shown · non-contrast
Comparison: MRI cervical spine [DATE]

CLINICAL DATA: C4-5 and C5-6 ACDF.

EXAM:
DG CERVICAL SPINE - 1 VIEW

[Series 1: run · 2 of 2 slices shown]
[im 1/2]
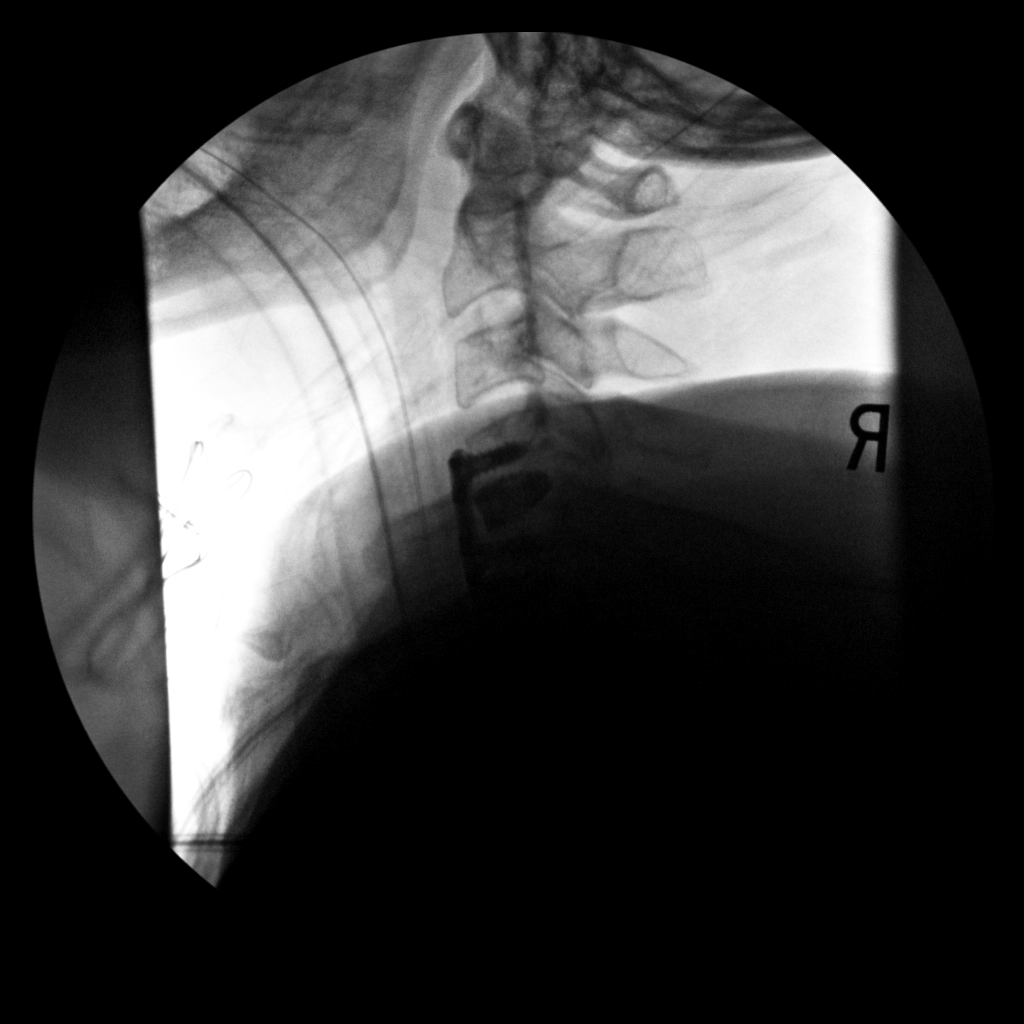
[im 2/2]
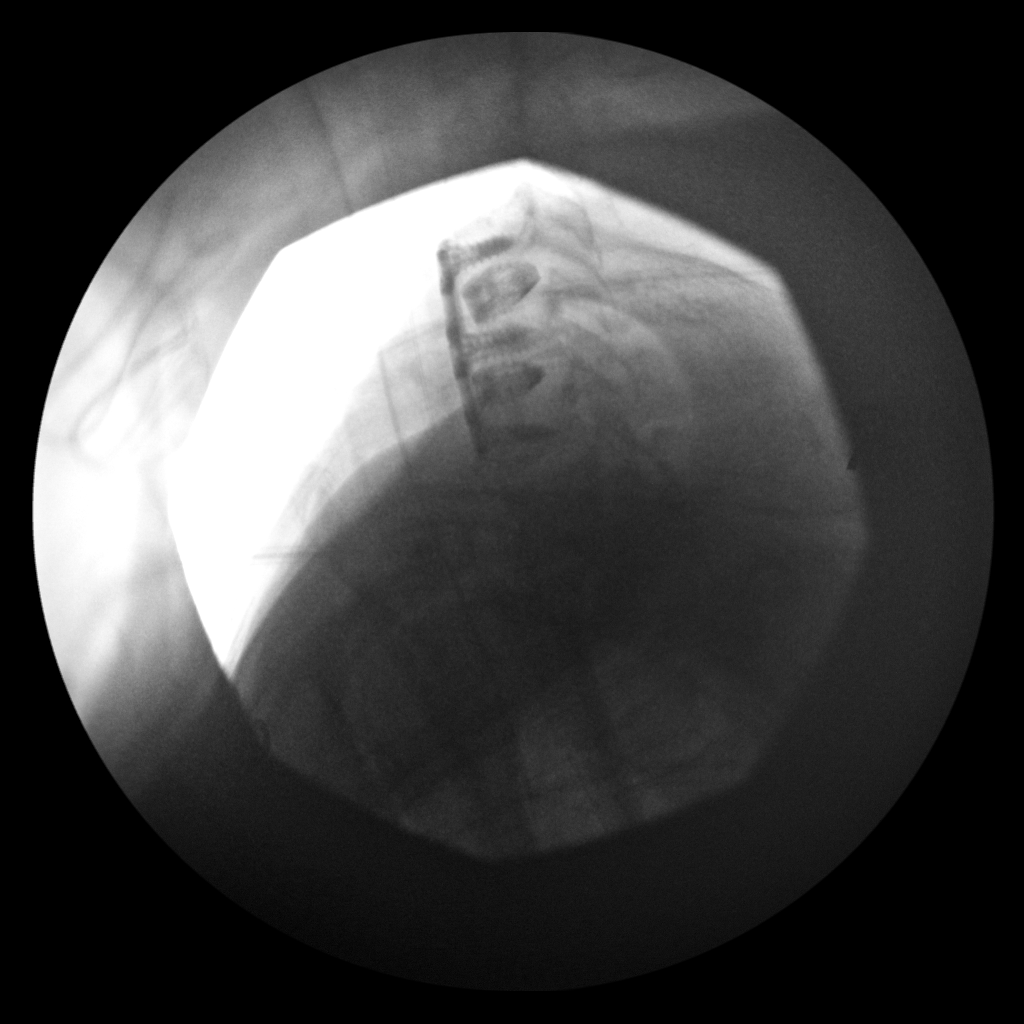

[2 of 2 positions shown; findings below may reference images not displayed]

FINDINGS: Two sagittal images of the cervical spine. Images were performed
intraoperatively without the presence of a radiologist. The patient
appears to be undergoing C4 through C6 ACDF with associated
intervertebral disc spacers.
IMPRESSION: Intraoperative fluoroscopy for C4 through C6 ACDF.

## 2021-12-19 SURGERY — ANTERIOR CERVICAL DECOMPRESSION/DISCECTOMY FUSION 2 LEVELS
Anesthesia: General

## 2021-12-19 MED ORDER — FENTANYL CITRATE (PF) 250 MCG/5ML IJ SOLN
INTRAMUSCULAR | Status: DC | PRN
  Administered 2021-12-19: 50 ug via INTRAVENOUS
  Administered 2021-12-19: 100 ug via INTRAVENOUS

## 2021-12-19 MED ORDER — HEMOSTATIC AGENTS (NO CHARGE) OPTIME
TOPICAL | Status: DC | PRN
  Administered 2021-12-19: 1 via TOPICAL

## 2021-12-19 MED ORDER — CHLORHEXIDINE GLUCONATE CLOTH 2 % EX PADS: 6.0000 | MEDICATED_PAD | Freq: Once | CUTANEOUS | Status: DC

## 2021-12-19 MED ORDER — CHLORHEXIDINE GLUCONATE 0.12 % MT SOLN: 15.0000 mL | Freq: Once | OROMUCOSAL | Status: AC

## 2021-12-19 MED ORDER — PHENOL 1.4 % MT LIQD: 1.0000 | OROMUCOSAL | Status: DC | PRN

## 2021-12-19 MED ORDER — METHOCARBAMOL 500 MG PO TABS
500.0000 mg | ORAL_TABLET | Freq: Four times a day (QID) | ORAL | 0 refills | Status: DC
  Filled 2021-12-19: qty 45, 12d supply, fill #0

## 2021-12-19 MED ORDER — POLYETHYLENE GLYCOL 3350 17 G PO PACK: 17.0000 g | PACK | Freq: Every day | ORAL | Status: DC | PRN

## 2021-12-19 MED ORDER — CEFAZOLIN SODIUM-DEXTROSE 2-4 GM/100ML-% IV SOLN
2.0000 g | Freq: Three times a day (TID) | INTRAVENOUS | Status: DC
  Administered 2021-12-19: 2 g via INTRAVENOUS
  Filled 2021-12-19: qty 100

## 2021-12-19 MED ORDER — ONDANSETRON HCL 4 MG PO TABS: 4.0000 mg | ORAL_TABLET | Freq: Four times a day (QID) | ORAL | Status: DC | PRN

## 2021-12-19 MED ORDER — ACETAMINOPHEN 325 MG PO TABS: 650.0000 mg | ORAL_TABLET | ORAL | Status: DC | PRN

## 2021-12-19 MED ORDER — ATORVASTATIN CALCIUM 80 MG PO TABS
80.0000 mg | ORAL_TABLET | Freq: Every day | ORAL | Status: DC
Start: 2021-12-19 — End: 2021-12-19
  Administered 2021-12-19: 80 mg via ORAL
  Filled 2021-12-19: qty 1

## 2021-12-19 MED ORDER — HYDROCODONE-ACETAMINOPHEN 5-325 MG PO TABS
1.0000 | ORAL_TABLET | ORAL | 0 refills | Status: DC | PRN
  Filled 2021-12-19: qty 20, 4d supply, fill #0

## 2021-12-19 MED ORDER — ROCURONIUM BROMIDE 10 MG/ML (PF) SYRINGE
PREFILLED_SYRINGE | INTRAVENOUS | Status: AC
  Filled 2021-12-19: qty 10

## 2021-12-19 MED ORDER — CYCLOBENZAPRINE HCL 10 MG PO TABS: 10.0000 mg | ORAL_TABLET | Freq: Three times a day (TID) | ORAL | Status: DC | PRN

## 2021-12-19 MED ORDER — DULOXETINE HCL 30 MG PO CPEP
60.0000 mg | ORAL_CAPSULE | Freq: Every day | ORAL | Status: DC
  Administered 2021-12-19: 60 mg via ORAL
  Filled 2021-12-19: qty 2

## 2021-12-19 MED ORDER — THROMBIN 5000 UNITS EX SOLR: OROMUCOSAL | Status: DC | PRN

## 2021-12-19 MED ORDER — CEFAZOLIN SODIUM-DEXTROSE 2-4 GM/100ML-% IV SOLN
2.0000 g | INTRAVENOUS | Status: AC
  Administered 2021-12-19: 2 g via INTRAVENOUS

## 2021-12-19 MED ORDER — SODIUM CHLORIDE 0.9% FLUSH
3.0000 mL | Freq: Two times a day (BID) | INTRAVENOUS | Status: DC
  Administered 2021-12-19: 3 mL via INTRAVENOUS

## 2021-12-19 MED ORDER — ONDANSETRON HCL 4 MG/2ML IJ SOLN
INTRAMUSCULAR | Status: DC | PRN
  Administered 2021-12-19: 4 mg via INTRAVENOUS

## 2021-12-19 MED ORDER — CHLORHEXIDINE GLUCONATE 0.12 % MT SOLN
OROMUCOSAL | Status: AC
  Administered 2021-12-19: 15 mL via OROMUCOSAL
  Filled 2021-12-19: qty 15

## 2021-12-19 MED ORDER — DEXAMETHASONE SODIUM PHOSPHATE 10 MG/ML IJ SOLN: 10.0000 mg | Freq: Once | INTRAMUSCULAR | Status: DC

## 2021-12-19 MED ORDER — IRBESARTAN 75 MG PO TABS: 37.5000 mg | ORAL_TABLET | Freq: Every day | ORAL | Status: DC

## 2021-12-19 MED ORDER — HYDROMORPHONE HCL 1 MG/ML IJ SOLN: 0.5000 mg | INTRAMUSCULAR | Status: DC | PRN

## 2021-12-19 MED ORDER — MENTHOL 3 MG MT LOZG: 1.0000 | LOZENGE | OROMUCOSAL | Status: DC | PRN

## 2021-12-19 MED ORDER — FENTANYL CITRATE (PF) 250 MCG/5ML IJ SOLN
INTRAMUSCULAR | Status: AC
  Filled 2021-12-19: qty 5

## 2021-12-19 MED ORDER — FENTANYL CITRATE (PF) 100 MCG/2ML IJ SOLN
INTRAMUSCULAR | Status: AC
  Filled 2021-12-19: qty 2

## 2021-12-19 MED ORDER — ORAL CARE MOUTH RINSE: 15.0000 mL | Freq: Once | OROMUCOSAL | Status: AC

## 2021-12-19 MED ORDER — PROPOFOL 10 MG/ML IV BOLUS
INTRAVENOUS | Status: AC
  Filled 2021-12-19: qty 20

## 2021-12-19 MED ORDER — ACETAMINOPHEN 650 MG RE SUPP: 650.0000 mg | RECTAL | Status: DC | PRN

## 2021-12-19 MED ORDER — ROCURONIUM BROMIDE 10 MG/ML (PF) SYRINGE
PREFILLED_SYRINGE | INTRAVENOUS | Status: DC | PRN
  Administered 2021-12-19: 40 mg via INTRAVENOUS
  Administered 2021-12-19: 60 mg via INTRAVENOUS

## 2021-12-19 MED ORDER — DEXAMETHASONE SODIUM PHOSPHATE 10 MG/ML IJ SOLN
INTRAMUSCULAR | Status: DC | PRN
  Administered 2021-12-19: 10 mg via INTRAVENOUS

## 2021-12-19 MED ORDER — OXYCODONE HCL 5 MG PO TABS: 10.0000 mg | ORAL_TABLET | ORAL | Status: DC | PRN

## 2021-12-19 MED ORDER — ACETAMINOPHEN 500 MG PO TABS: 1000.0000 mg | ORAL_TABLET | Freq: Once | ORAL | Status: AC

## 2021-12-19 MED ORDER — SODIUM CHLORIDE 0.9% FLUSH: 3.0000 mL | INTRAVENOUS | Status: DC | PRN

## 2021-12-19 MED ORDER — MIDAZOLAM HCL 2 MG/2ML IJ SOLN
INTRAMUSCULAR | Status: AC
  Filled 2021-12-19: qty 2

## 2021-12-19 MED ORDER — ONDANSETRON HCL 4 MG/2ML IJ SOLN
INTRAMUSCULAR | Status: AC
  Filled 2021-12-19: qty 2

## 2021-12-19 MED ORDER — SODIUM CHLORIDE 0.9 % IV SOLN: 250.0000 mL | INTRAVENOUS | Status: DC

## 2021-12-19 MED ORDER — THROMBIN 5000 UNITS EX SOLR
CUTANEOUS | Status: AC
  Filled 2021-12-19: qty 15000

## 2021-12-19 MED ORDER — MIDAZOLAM HCL 2 MG/2ML IJ SOLN
INTRAMUSCULAR | Status: DC | PRN
  Administered 2021-12-19: 2 mg via INTRAVENOUS

## 2021-12-19 MED ORDER — FENTANYL CITRATE (PF) 100 MCG/2ML IJ SOLN
25.0000 ug | INTRAMUSCULAR | Status: DC | PRN
  Administered 2021-12-19 (×4): 25 ug via INTRAVENOUS

## 2021-12-19 MED ORDER — GABAPENTIN 300 MG PO CAPS
900.0000 mg | ORAL_CAPSULE | Freq: Three times a day (TID) | ORAL | Status: DC
  Administered 2021-12-19: 900 mg via ORAL
  Filled 2021-12-19: qty 3

## 2021-12-19 MED ORDER — ACETAMINOPHEN 500 MG PO TABS
ORAL_TABLET | ORAL | Status: AC
  Administered 2021-12-19: 1000 mg via ORAL
  Filled 2021-12-19: qty 2

## 2021-12-19 MED ORDER — LIDOCAINE 2% (20 MG/ML) 5 ML SYRINGE
INTRAMUSCULAR | Status: DC | PRN
  Administered 2021-12-19: 80 mg via INTRAVENOUS

## 2021-12-19 MED ORDER — AMLODIPINE BESYLATE 5 MG PO TABS
5.0000 mg | ORAL_TABLET | Freq: Every day | ORAL | Status: DC
Start: 2021-12-19 — End: 2021-12-19
  Administered 2021-12-19: 5 mg via ORAL
  Filled 2021-12-19: qty 1

## 2021-12-19 MED ORDER — LIDOCAINE 2% (20 MG/ML) 5 ML SYRINGE
INTRAMUSCULAR | Status: AC
  Filled 2021-12-19: qty 5

## 2021-12-19 MED ORDER — 0.9 % SODIUM CHLORIDE (POUR BTL) OPTIME
TOPICAL | Status: DC | PRN
  Administered 2021-12-19: 1000 mL

## 2021-12-19 MED ORDER — LACTATED RINGERS IV SOLN: INTRAVENOUS | Status: DC

## 2021-12-19 MED ORDER — CEFAZOLIN SODIUM-DEXTROSE 2-4 GM/100ML-% IV SOLN
INTRAVENOUS | Status: AC
  Filled 2021-12-19: qty 100

## 2021-12-19 MED ORDER — ALUM & MAG HYDROXIDE-SIMETH 200-200-20 MG/5ML PO SUSP: 30.0000 mL | Freq: Four times a day (QID) | ORAL | Status: DC | PRN

## 2021-12-19 MED ORDER — ONDANSETRON HCL 4 MG/2ML IJ SOLN: 4.0000 mg | Freq: Four times a day (QID) | INTRAMUSCULAR | Status: DC | PRN

## 2021-12-19 MED ORDER — PROPOFOL 10 MG/ML IV BOLUS
INTRAVENOUS | Status: DC | PRN
  Administered 2021-12-19: 140 mg via INTRAVENOUS

## 2021-12-19 MED ORDER — THROMBIN 5000 UNITS EX SOLR
CUTANEOUS | Status: DC | PRN
  Administered 2021-12-19 (×2): 5000 [IU] via TOPICAL

## 2021-12-19 MED ORDER — DEXAMETHASONE SODIUM PHOSPHATE 10 MG/ML IJ SOLN
INTRAMUSCULAR | Status: AC
  Filled 2021-12-19: qty 1

## 2021-12-19 MED ORDER — MOMETASONE FURO-FORMOTEROL FUM 200-5 MCG/ACT IN AERO
2.0000 | INHALATION_SPRAY | Freq: Two times a day (BID) | RESPIRATORY_TRACT | Status: DC
  Filled 2021-12-19: qty 8.8

## 2021-12-19 MED ORDER — SUGAMMADEX SODIUM 200 MG/2ML IV SOLN
INTRAVENOUS | Status: DC | PRN
  Administered 2021-12-19: 200 mg via INTRAVENOUS

## 2021-12-19 MED ORDER — SIMETHICONE 80 MG PO CHEW: 80.0000 mg | CHEWABLE_TABLET | Freq: Four times a day (QID) | ORAL | Status: DC | PRN

## 2021-12-19 MED ORDER — PROMETHAZINE HCL 25 MG/ML IJ SOLN: 6.2500 mg | INTRAMUSCULAR | Status: DC | PRN

## 2021-12-19 MED ORDER — PANTOPRAZOLE SODIUM 40 MG PO TBEC
40.0000 mg | DELAYED_RELEASE_TABLET | Freq: Two times a day (BID) | ORAL | Status: DC
Start: 2021-12-19 — End: 2021-12-19
  Administered 2021-12-19: 40 mg via ORAL
  Filled 2021-12-19: qty 1

## 2021-12-19 MED ORDER — PANTOPRAZOLE SODIUM 40 MG IV SOLR: 40.0000 mg | Freq: Every day | INTRAVENOUS | Status: DC

## 2021-12-19 MED ORDER — ACETAMINOPHEN 500 MG PO TABS: 1000.0000 mg | ORAL_TABLET | Freq: Four times a day (QID) | ORAL | Status: DC | PRN

## 2021-12-19 SURGICAL SUPPLY — 60 items
BAG COUNTER SPONGE SURGICOUNT (BAG) ×2 IMPLANT
BAG SURGICOUNT SPONGE COUNTING (BAG) ×1
BAND RUBBER #18 3X1/16 STRL (MISCELLANEOUS) ×6 IMPLANT
BASKET BONE COLLECTION (BASKET) ×3 IMPLANT
BENZOIN TINCTURE PRP APPL 2/3 (GAUZE/BANDAGES/DRESSINGS) ×3 IMPLANT
BIT DRILL NEURO 2X3.1 SFT TUCH (MISCELLANEOUS) ×1 IMPLANT
BONE VIVIGEN FORMABLE 1.3CC (Bone Implant) ×3 IMPLANT
BUR MATCHSTICK NEURO 3.0 LAGG (BURR) ×3 IMPLANT
CANISTER SUCT 3000ML PPV (MISCELLANEOUS) ×3 IMPLANT
CARTRIDGE OIL MAESTRO DRILL (MISCELLANEOUS) ×1 IMPLANT
CLOSURE WOUND 1/2 X4 (GAUZE/BANDAGES/DRESSINGS) ×1
DERMABOND ADVANCED (GAUZE/BANDAGES/DRESSINGS) ×2
DERMABOND ADVANCED .7 DNX12 (GAUZE/BANDAGES/DRESSINGS) IMPLANT
DIFFUSER DRILL AIR PNEUMATIC (MISCELLANEOUS) ×3 IMPLANT
DRAPE C-ARM 42X72 X-RAY (DRAPES) ×6 IMPLANT
DRAPE LAPAROTOMY 100X72 PEDS (DRAPES) ×3 IMPLANT
DRAPE MICROSCOPE LEICA (MISCELLANEOUS) ×3 IMPLANT
DRILL NEURO 2X3.1 SOFT TOUCH (MISCELLANEOUS) ×3
DRSG OPSITE POSTOP 4X6 (GAUZE/BANDAGES/DRESSINGS) ×4 IMPLANT
DURAPREP 6ML APPLICATOR 50/CS (WOUND CARE) ×3 IMPLANT
ELECT COATED BLADE 2.86 ST (ELECTRODE) ×3 IMPLANT
ELECT REM PT RETURN 9FT ADLT (ELECTROSURGICAL) ×3
ELECTRODE REM PT RTRN 9FT ADLT (ELECTROSURGICAL) ×1 IMPLANT
GAUZE 4X4 16PLY ~~LOC~~+RFID DBL (SPONGE) IMPLANT
GAUZE SPONGE 4X4 12PLY STRL (GAUZE/BANDAGES/DRESSINGS) ×3 IMPLANT
GLOVE EXAM NITRILE XL STR (GLOVE) IMPLANT
GLOVE SURG ENC MOIS LTX SZ7 (GLOVE) IMPLANT
GLOVE SURG ENC MOIS LTX SZ8 (GLOVE) ×3 IMPLANT
GLOVE SURG UNDER LTX SZ8.5 (GLOVE) ×3 IMPLANT
GLOVE SURG UNDER POLY LF SZ7 (GLOVE) IMPLANT
GOWN STRL REUS W/ TWL LRG LVL3 (GOWN DISPOSABLE) IMPLANT
GOWN STRL REUS W/ TWL XL LVL3 (GOWN DISPOSABLE) ×1 IMPLANT
GOWN STRL REUS W/TWL 2XL LVL3 (GOWN DISPOSABLE) IMPLANT
GOWN STRL REUS W/TWL LRG LVL3 (GOWN DISPOSABLE)
GOWN STRL REUS W/TWL XL LVL3 (GOWN DISPOSABLE) ×2
GRAFT BNE MATRIX VG FRMBL SM 1 (Bone Implant) IMPLANT
HALTER HD/CHIN CERV TRACTION D (MISCELLANEOUS) ×3 IMPLANT
HEMOSTAT POWDER KIT SURGIFOAM (HEMOSTASIS) ×3 IMPLANT
KIT BASIN OR (CUSTOM PROCEDURE TRAY) ×3 IMPLANT
KIT TURNOVER KIT B (KITS) ×3 IMPLANT
NDL SPNL 20GX3.5 QUINCKE YW (NEEDLE) ×1 IMPLANT
NEEDLE SPNL 20GX3.5 QUINCKE YW (NEEDLE) ×3 IMPLANT
NS IRRIG 1000ML POUR BTL (IV SOLUTION) ×3 IMPLANT
OIL CARTRIDGE MAESTRO DRILL (MISCELLANEOUS) ×3
PACK LAMINECTOMY NEURO (CUSTOM PROCEDURE TRAY) ×3 IMPLANT
PAD ARMBOARD 7.5X6 YLW CONV (MISCELLANEOUS) ×3 IMPLANT
PIN DISTRACTION 14MM (PIN) ×4 IMPLANT
PLATE CERV RES 30 2L (Plate) ×2 IMPLANT
SCREW VA SD RESONATE 4.2X14 (Screw) ×12 IMPLANT
SPACER C HEDRON 12X14 6 7D (Spacer) ×2 IMPLANT
SPACER C HEDRON 12X14 7M 7D (Spacer) ×2 IMPLANT
SPONGE INTESTINAL PEANUT (DISPOSABLE) ×3 IMPLANT
SPONGE SURGIFOAM ABS GEL SZ50 (HEMOSTASIS) ×3 IMPLANT
STRIP CLOSURE SKIN 1/2X4 (GAUZE/BANDAGES/DRESSINGS) ×2 IMPLANT
SUT VIC AB 3-0 SH 8-18 (SUTURE) ×3 IMPLANT
SUT VICRYL 4-0 PS2 18IN ABS (SUTURE) ×3 IMPLANT
TAPE CLOTH 4X10 WHT NS (GAUZE/BANDAGES/DRESSINGS) ×1 IMPLANT
TOWEL GREEN STERILE (TOWEL DISPOSABLE) ×3 IMPLANT
TOWEL GREEN STERILE FF (TOWEL DISPOSABLE) ×3 IMPLANT
WATER STERILE IRR 1000ML POUR (IV SOLUTION) ×3 IMPLANT

## 2021-12-19 NOTE — Anesthesia Postprocedure Evaluation (Signed)
Anesthesia Post Note  Patient: Colburn Beeks  Procedure(s) Performed: Anterior Cervical Discectomy and Fusion Cervical Four-Five-Cervical Five-Six     Patient location during evaluation: PACU Anesthesia Type: General Level of consciousness: awake and alert Pain management: pain level controlled Vital Signs Assessment: post-procedure vital signs reviewed and stable Respiratory status: spontaneous breathing, nonlabored ventilation and respiratory function stable Cardiovascular status: blood pressure returned to baseline and stable Postop Assessment: no apparent nausea or vomiting Anesthetic complications: no   No notable events documented.  Last Vitals:  Vitals:   12/19/21 1257 12/19/21 1259  BP: (!) 171/101 (!) 170/97  Pulse: 93 92  Resp: 18   Temp: 36.5 C   SpO2: 93% 94%    Last Pain:  Vitals:   12/19/21 1305  TempSrc:   PainSc: 3                  Catalina Gravel

## 2021-12-19 NOTE — Op Note (Signed)
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  Postoperative diagnosis: Same  Procedure: Anterior cervical discectomies and fusion at C4-5 and C5-6 utilizing the globus Yijun titanium cage packed with locally harvested autograft mixed with vision and anterior cervical plating utilizing the globus profile self locking plating system  Surgeon: Dominica Severin Elenora Hawbaker  Assistant: Nash Shearer  Anesthesia: General  EBL: Minimal  HPI: 61 year old male with neck pain bilateral shoulder and arm pain numbness tingling weakness in his hands difficulty walking exam consistent with myelopathy work-up revealed cord compression severe stenosis at C4-5 and C5-6.  Due to patient's progression of clinical syndrome imaging findings and failed conservative treatment I recommended anterior cervical discectomies and fusion at those 2 levels.  I extensively went over the risks and benefits of the operation with him as well as perioperative course expectations of outcome and alternatives to surgery and he understood and agreed to proceed forward.  Operative procedure: Patient was brought into the OR was [facility] general anesthesia positioned supine the neck in slight extension 5 pounds halter traction.  The right side of his neck was prepped and draped in routine sterile fashion.  Preoperative x-ray localized the appropriate level.  So after the x-ray a curvilinear incision was made just off the midline to the anterior border of the sternocleidomastoid and the superficial layer platysma was dissected out divided longitudinally the avascular plane between the sternocleidomastoid and strap muscle was developed down to the prevertebral fascia and prevertebral fascia was dissected away with Kitners.  Intraoperative x-ray confirmed identification appropriate level.  Annulotomy was made with a 15 with scalpel to mark the disc base self-retaining retractors were  placed longus goes reflected laterally.  Anterior osteophytes were bitten off with a Leksell rongeur and a 2 and 3 Miller Kerrison punch.  Both disc bases were drilled down capturing the bone shavings and mucus trap.  Under microscopic lamination first working at C4-5 disc base was further drilled down and then the posterior annulus and posterior longitudinal ligament was removed in piecemeal fashion large posterior spurs coming off the C4 and C5 vertebral bodies were all under Bitton and removed marching laterally both C5 pedicles were identified both C5 nerve roots were decompressed flush with pedicle.  Significant decompression of thecal sac both centrally and foraminally was achieved this was then packed with Gelfoam and attention was taken at C5-6.  In a similar fashion C5-6 was further drilled down pathology here was a lot of soft disc this was all removed in piecemeal fashion aggressive under biting of the endplates decompress the central canal both C6 pedicles were identified both C6 nerve roots were skeletonized and decompressed flush with the pedicle.  After adequate decompression been achieved centrally and foraminally both disc bases I sized up to cages packed with locally harvested autograft mixed with vivigen and inserted a 7 mm lordotic cage at C4-5 a 6 mm at C5-6 30 mm globus plate and all screws were placed and had excellent purchase locking mechanisms were engaged.  Wound was copiously irrigated meticulous hemostasis was maintained the wound was then closed in layers after packing some additional bone graft around the cage and underneath the plate with interrupted Vicryl and a running 4 subcuticular Dermabond benzoin Steri-Strips and a sterile dressing was applied patient recovery in stable condition.  At the end the case all needle counts and sponge counts were correct.

## 2021-12-19 NOTE — Progress Notes (Signed)
PT Cancellation Note  Patient Details Name: Eric Ingram MRN: [id number] DOB: Nov 13, 1961   Cancelled Treatment:    Reason Eval/Treat Not Completed: PT screened, no needs identified, will sign off - pt independent with mobility, OT confirms. Pt does not need acute PT services, and will sign off.   Stacie Glaze, PT DPT Acute Rehabilitation Services Pager [phone]  Office [phone]    Louis Matte 12/19/2021, 3:40 PM

## 2021-12-19 NOTE — Transfer of Care (Signed)
Immediate Anesthesia Transfer of Care Note  Patient: Eric Ingram  Procedure(s) Performed: Anterior Cervical Discectomy and Fusion Cervical Four-Five-Cervical Five-Six  Patient Location: PACU  Anesthesia Type:General  Level of Consciousness: drowsy and patient cooperative  Airway & Oxygen Therapy: Patient Spontanous Breathing  Post-op Assessment: Report given to RN and Post -op Vital signs reviewed and stable  Post vital signs: Reviewed and stable  Last Vitals:  Vitals Value Taken Time  BP 181/87 12/19/21 1008  Temp    Pulse 97 12/19/21 1008  Resp 18 12/19/21 1008  SpO2 95 % 12/19/21 1008  Vitals shown include unvalidated device data.  Last Pain:  Vitals:   12/19/21 0550  TempSrc:   PainSc: 10-Worst pain ever         Complications: No notable events documented.

## 2021-12-19 NOTE — Progress Notes (Signed)
Orthopedic Tech Progress Note Patient Details:  Eric Ingram 02-Nov-1961 [id number]  Ortho Devices Type of Ortho Device: Soft collar Ortho Device/Splint Interventions: Ordered, Application   Post Interventions Patient Tolerated: Well  Eric Ingram A Eric Ingram 12/19/2021, 12:25 PM

## 2021-12-19 NOTE — Plan of Care (Signed)

## 2021-12-19 NOTE — Discharge Instructions (Signed)

## 2021-12-19 NOTE — H&P (Signed)
Eric Ingram is an 61 y.o. male.   Chief Complaint: Neck pain numbness tingling weakness in his arms HPI: 61 year old gentleman with neck pain bilateral shoulder and arm pain weakness and difficulty walking.  Work-up has revealed severe spinal cord compression from disc radiations at C4-5 and C5-6.  Due to patient's progression of clinical syndrome imaging findings of a conservative treatment of recommended a two-level anterior cervical discectomy and fusion.  I have extensively gone over the risks and benefits of that operation with him as well as perioperative course expectations of outcome and alternatives of surgery and he understands and agrees to proceed forward.  Past Medical History:  Diagnosis Date   Anemia    CHF (congestive heart failure) ([place])    Dental infection 08/02/2018   GERD (gastroesophageal reflux disease)    Hypertension    Shingles rash 07/07/2019   Thalassemia, alpha ([place])    Viral gastritis 02/08/2019    Past Surgical History:  Procedure Laterality Date   NO PAST SURGERIES      Family History  Problem Relation Age of Onset   Colon cancer Neg Hx    Stomach cancer Neg Hx    Social History:  reports that he quit smoking about 22 years ago. His smoking use included cigarettes. He has a 5.00 pack-year smoking history. He has never used smokeless tobacco. He reports that he does not drink alcohol and does not use drugs.  Allergies: No Known Allergies  Medications Prior to Admission  Medication Sig Dispense Refill   acetaminophen (TYLENOL) 500 MG tablet Take 1,000 mg by mouth every 6 (six) hours as needed for mild pain.     amLODipine (NORVASC) 5 MG tablet Take 1 tablet (5 mg total) by mouth daily. 30 tablet 11   atorvastatin (LIPITOR) 80 MG tablet Take 1 tablet (80 mg total) by mouth daily. 30 tablet 2   DULoxetine (CYMBALTA) 60 MG capsule Take 1 capsule (60 mg total) by mouth daily. 30 capsule 3   fluticasone-salmeterol (ADVAIR) 500-50 MCG/ACT AEPB Inhale 1 puff  into the lungs daily.     gabapentin (NEURONTIN) 300 MG capsule Take 3 capsules (900 mg total) by mouth 3 (three) times daily. 270 capsule 2   pantoprazole (PROTONIX) 40 MG tablet Take 1 tablet (40 mg total) by mouth 2 (two) times daily. 60 tablet 2   polyethylene glycol (MIRALAX) 17 g packet Take 17 g (1 capful) by mouth daily. (Patient taking differently: Take 17 g by mouth daily as needed for mild constipation.) 28 packet 0   simethicone (GAS-X) 80 MG chewable tablet Chew 1 tablet (80 mg total) by mouth every 6 (six) hours as needed for flatulence. (Patient not taking: Reported on 12/11/2021) 30 tablet 0   valsartan (DIOVAN) 320 MG tablet Take 1 tablet (320 mg total) by mouth daily. (Patient not taking: Reported on 12/11/2021) 30 tablet 2    No results found for this or any previous visit (from the past 48 hour(s)). No results found.  Review of Systems  Musculoskeletal:  Positive for neck pain.  Neurological:  Positive for weakness and numbness.   Blood pressure (!) 162/94, pulse 74, temperature 98.1 F (36.7 C), temperature source Oral, resp. rate 18, height 5\' 2"  (1.575 m), weight 72.6 kg, SpO2 98 %. Physical Exam HENT:     Head: Normocephalic.     Right Ear: Tympanic membrane normal.     Nose: Nose normal.     Mouth/Throat:     Mouth: Mucous membranes are moist.  Eyes:     Pupils: Pupils are equal, round, and reactive to light.  Cardiovascular:     Rate and Rhythm: Normal rate.  Pulmonary:     Effort: Pulmonary effort is normal.  Abdominal:     General: Abdomen is flat.  Musculoskeletal:        General: Normal range of motion.  Skin:    General: Skin is warm.  Neurological:     Mental Status: He is alert.     Comments: Patient is awake and alert exam performed through an interpreter strength appears to be 4+ out of 5 tricep weakness on the right 4+ out of 5 grip strength on the left otherwise 5 out of 5 upper extremities bilaterally.     Assessment/Plan 61 year old  presents for an ACDF C4-5 C5-6.  Elaina Hoops, MD 12/19/2021, 7:35 AM

## 2021-12-19 NOTE — Progress Notes (Signed)
Patient awaiting transport via wheelchair by NT for discharge home; in no acute distress nor complaints of pain nor discomfort; incision on his neck was clean, dry and intact and a new honeycomb dressing was applied with a soft collar on; room was checked and accounted for all his belongings; discharge instructions concerning his medications, incision care, follow up appointment and when to call the doctor as needed were all discussed with patient's daughter by RN and she expressed understanding on the instructions given.

## 2021-12-19 NOTE — Anesthesia Procedure Notes (Signed)
Procedure Name: Intubation Date/Time: 12/19/2021 7:49 AM Performed by: Lance Coon, CRNA Pre-anesthesia Checklist: Patient identified, Emergency Drugs available, Suction available, Patient being monitored and Timeout performed Patient Re-evaluated:Patient Re-evaluated prior to induction Oxygen Delivery Method: Circle system utilized Preoxygenation: Pre-oxygenation with 100% oxygen Induction Type: IV induction Ventilation: Mask ventilation without difficulty Laryngoscope Size: Miller and 3 Grade View: Grade I Tube type: Oral Tube size: 7.5 mm Number of attempts: 1 Airway Equipment and Method: Stylet Placement Confirmation: ETT inserted through vocal cords under direct vision, positive ETCO2 and breath sounds checked- equal and bilateral Secured at: 22 cm Tube secured with: Tape Dental Injury: Teeth and Oropharynx as per pre-operative assessment

## 2021-12-19 NOTE — Evaluation (Signed)
Occupational Therapy Evaluation Patient Details Name: Eric Ingram MRN: [id number] DOB: 10-06-1961 Today's Date: 12/19/2021   History of Present Illness 61 yo M s/p ACDF.  PMH:CHF (congestive heart failure) ([place])      Dental infection 08/02/2018   GERD (gastroesophageal reflux disease)     Hypertension   Clinical Impression   Patient admitted for the procedure above.  PTA he lives with his spouse and daughter, and has assist as needed.  Patient did not use any AD for mobility, no assist with ADL/IADL, drives locally, and does not work.  Precaution sheet issued and reviewed with daughter's assist.  All questions answered, patient demonstrates good understanding of precautions.  No further OT needs in the acute setting.  Recommend home with assist as needed from family.  No PT needs, advised they can screen as he is walking the halls ad lib.       Recommendations for follow up therapy are one component of a multi-disciplinary discharge planning process, led by the attending physician.  Recommendations may be updated based on patient status, additional functional criteria and insurance authorization.   Follow Up Recommendations  No OT follow up    Assistance Recommended at Discharge PRN  Patient can return home with the following      Functional Status Assessment  Patient has not had a recent decline in their functional status  Equipment Recommendations  None recommended by OT    Recommendations for Other Services       Precautions / Restrictions Precautions Precautions: Cervical Precaution Booklet Issued: Yes (comment) Precaution Comments: verbalized understanding Required Braces or Orthoses: Cervical Brace Cervical Brace: Soft collar;For comfort Restrictions Weight Bearing Restrictions: No      Mobility Bed Mobility Overal bed mobility: Modified Independent                  Transfers Overall transfer level: Modified independent                         Balance Overall balance assessment: Mild deficits observed, not formally tested                                         ADL either performed or assessed with clinical judgement   ADL Overall ADL's : At baseline                                             Vision Baseline Vision/History: 0 No visual deficits Patient Visual Report: No change from baseline       Perception Perception Perception: Not tested   Praxis Praxis Praxis: Not tested    Pertinent Vitals/Pain Pain Assessment: Faces Faces Pain Scale: Hurts little more Pain Location: Neck and upper traps Pain Descriptors / Indicators: Tender;Sore Pain Intervention(s): Monitored during session     Hand Dominance Left   Extremity/Trunk Assessment Upper Extremity Assessment Upper Extremity Assessment: Overall WFL for tasks assessed   Lower Extremity Assessment Lower Extremity Assessment: Overall WFL for tasks assessed   Cervical / Trunk Assessment Cervical / Trunk Assessment: Neck Surgery   Communication Communication Communication: Prefers language other than English   Cognition Arousal/Alertness: Awake/alert Behavior During Therapy: WFL for tasks assessed/performed Overall Cognitive Status: Within Functional Limits for tasks assessed  Home Living Family/patient expects to be discharged to:: Private residence Living Arrangements: Spouse/significant other;Children Available Help at Discharge: Family;Available 24 hours/day Type of Home: Apartment Home Access: Level entry     Home Layout: One level     Bathroom Shower/Tub: Occupational psychologist: Standard     Home Equipment: Grab bars - tub/shower          Prior Functioning/Environment Prior Level of Function : Independent/Modified Independent                        OT Problem List: Pain      OT  Treatment/Interventions:      OT Goals(Current goals can be found in the care plan section) Acute Rehab OT Goals Patient Stated Goal: Return home OT Goal Formulation: With patient Time For Goal Achievement: 12/20/21 Potential to Achieve Goals: Good  OT Frequency:      Co-evaluation              AM-PAC OT "6 Clicks" Daily Activity     Outcome Measure Help from another person eating meals?: None Help from another person taking care of personal grooming?: None Help from another person toileting, which includes using toliet, bedpan, or urinal?: None Help from another person bathing (including washing, rinsing, drying)?: None Help from another person to put on and taking off regular upper body clothing?: None Help from another person to put on and taking off regular lower body clothing?: None 6 Click Score: 24   End of Session Equipment Utilized During Treatment: Cervical collar  Activity Tolerance: Patient tolerated treatment well Patient left: in bed;with call bell/phone within reach  OT Visit Diagnosis: Pain                Time: 7517-0017 OT Time Calculation (min): 28 min Charges:  OT General Charges $OT Visit: 1 Visit OT Evaluation $OT Eval Moderate Complexity: 1 Mod OT Treatments $Self Care/Home Management : 8-22 mins  12/19/2021  RP, OTR/L  Acute Rehabilitation Services  Office:  [phone]   Metta Clines 12/19/2021, 3:46 PM

## 2021-12-19 NOTE — Discharge Summary (Signed)
Physician Discharge Summary  Patient ID: Eric Ingram MRN: [id number] DOB/AGE: 08-17-1961 61 y.o.  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 The patient tolerated the procedure well and was taken to the recovery room and then to the floor in stable condition. The hospital course was routine. There were no complications. The wound remained clean dry and intact. Pt had appropriate neck soreness. No complaints of arm pain or new N/T/W. The patient remained afebrile with stable vital signs, and tolerated a regular diet. The patient continued to increase activities, and pain was well controlled with oral pain medications.   Consults: None  Significant Diagnostic Studies:  Results for orders placed or performed during the hospital encounter of 12/16/21  SARS CORONAVIRUS 2 (TAT 6-24 HRS) Nasopharyngeal Nasopharyngeal Swab   Specimen: Nasopharyngeal Swab  Result Value Ref Range   SARS Coronavirus 2 NEGATIVE NEGATIVE    DG Cervical Spine 1 View  Result Date: 12/19/2021 CLINICAL DATA:  C4-5 and C5-6 ACDF. EXAM: DG CERVICAL SPINE - 1 VIEW COMPARISON:  MRI cervical spine 07/05/2021 FINDINGS: Two sagittal images of the cervical spine. Images were performed intraoperatively without the presence of a radiologist. The patient appears to be undergoing C4 through C6 ACDF with associated intervertebral disc spacers. IMPRESSION: Intraoperative fluoroscopy for C4 through C6 ACDF. Electronically Signed   By: Yvonne [place]   On: 12/19/2021 10:05   DG C-Arm 1-60 Min-No Report  Result Date: 12/19/2021 Fluoroscopy was utilized by the requesting physician.  No radiographic interpretation.   DG C-Arm  1-60 Min-No Report  Result Date: 12/19/2021 Fluoroscopy was utilized by the requesting physician.  No radiographic interpretation.    Antibiotics:  Anti-infectives (From admission, onward)    Start     Dose/Rate Route Frequency Ordered Stop   12/19/21 1600  ceFAZolin (ANCEF) IVPB 2g/100 mL premix        2 g 200 mL/hr over 30 Minutes Intravenous Every 8 hours 12/19/21 1257 12/20/21 0759   12/19/21 0600  ceFAZolin (ANCEF) IVPB 2g/100 mL premix        2 g 200 mL/hr over 30 Minutes Intravenous On call to O.R. 12/19/21 0541 12/19/21 0758   12/19/21 0546  ceFAZolin (ANCEF) 2-4 GM/100ML-% IVPB       Note to Pharmacy: Alphonsus Sias: cabinet override      12/19/21 0546 12/19/21 0759       Discharge Exam: Blood pressure (!) 170/97, pulse 92, temperature 97.7 F (36.5 C), temperature source Oral, resp. rate 18, height 5\' 2"  (1.575 m), weight 72.6 kg, SpO2 94 %. Neurologic: Grossly normal Ambulating and voiding well incision cdi   Discharge Medications:   Allergies as of 12/19/2021   No Known Allergies      Medication List     STOP taking these medications    valsartan 320 MG tablet Commonly known as: DIOVAN       TAKE these medications    acetaminophen 500 MG tablet Commonly known as: TYLENOL Take 1,000 mg by mouth every 6 (six) hours as needed for mild pain.   amLODipine 5 MG tablet Commonly known as: NORVASC Take 1 tablet (5 mg total) by mouth daily.   atorvastatin 80 MG tablet Commonly known as: Lipitor Take 1  tablet (80 mg total) by mouth daily.   DULoxetine 60 MG capsule Commonly known as: CYMBALTA Take 1 capsule (60 mg total) by mouth daily.   fluticasone-salmeterol 500-50 MCG/ACT Aepb Commonly known as: ADVAIR Inhale 1 puff into the lungs daily.   gabapentin 300 MG capsule Commonly known as: NEURONTIN Take 3 capsules (900 mg total) by mouth 3 (three) times daily.   HYDROcodone-acetaminophen 5-325 MG tablet Commonly known as: NORCO/VICODIN Take 1 tablet  by mouth every 4 (four) hours as needed for moderate pain.   methocarbamol 500 MG tablet Commonly known as: Robaxin Take 1 tablet (500 mg total) by mouth 4 (four) times daily.   pantoprazole 40 MG tablet Commonly known as: PROTONIX Take 1 tablet (40 mg total) by mouth 2 (two) times daily.   polyethylene glycol 17 g packet Commonly known as: MiraLax Take 17 g (1 capful) by mouth daily. What changed:  when to take this reasons to take this   simethicone 80 MG chewable tablet Commonly known as: Gas-X Chew 1 tablet (80 mg total) by mouth every 6 (six) hours as needed for flatulence.        Disposition: home   Final Dx: acdf C4-, C5-6  Discharge Instructions      Remove dressing in 72 hours   Complete by: As directed    Call MD for:  difficulty breathing, headache or visual disturbances   Complete by: As directed    Call MD for:  hives   Complete by: As directed    Call MD for:  persistant nausea and vomiting   Complete by: As directed    Call MD for:  redness, tenderness, or signs of infection (pain, swelling, redness, odor or green/yellow discharge around incision site)   Complete by: As directed    Call MD for:  severe uncontrolled pain   Complete by: As directed    Call MD for:  temperature >100.4   Complete by: As directed    Diet - low sodium heart healthy   Complete by: As directed    Driving Restrictions   Complete by: As directed    No driving for 2 weeks, no riding in the car for 1 week   Increase activity slowly   Complete by: As directed    Lifting restrictions   Complete by: As directed    No lifting more than 8 lbs          Signed: Ocie Cornfield Emmarose Klinke 12/19/2021, 3:03 PM

## 2021-12-21 ENCOUNTER — Encounter (HOSPITAL_COMMUNITY): Payer: Self-pay | Admitting: Neurosurgery

## 2022-01-06 ENCOUNTER — Other Ambulatory Visit (HOSPITAL_COMMUNITY): Payer: Self-pay

## 2022-01-06 ENCOUNTER — Other Ambulatory Visit: Payer: Self-pay

## 2022-01-07 ENCOUNTER — Other Ambulatory Visit (HOSPITAL_COMMUNITY): Payer: Self-pay

## 2022-01-09 ENCOUNTER — Other Ambulatory Visit (HOSPITAL_COMMUNITY): Payer: Self-pay

## 2022-01-14 ENCOUNTER — Other Ambulatory Visit (HOSPITAL_COMMUNITY): Payer: Self-pay

## 2022-01-14 ENCOUNTER — Other Ambulatory Visit: Payer: Self-pay

## 2022-01-16 ENCOUNTER — Other Ambulatory Visit (HOSPITAL_COMMUNITY): Payer: Self-pay

## 2022-01-19 ENCOUNTER — Other Ambulatory Visit (HOSPITAL_COMMUNITY): Payer: Self-pay

## 2022-01-21 ENCOUNTER — Other Ambulatory Visit: Payer: Self-pay

## 2022-01-21 ENCOUNTER — Other Ambulatory Visit (HOSPITAL_COMMUNITY): Payer: Self-pay

## 2022-01-23 ENCOUNTER — Other Ambulatory Visit (HOSPITAL_COMMUNITY): Payer: Self-pay

## 2022-01-23 ENCOUNTER — Other Ambulatory Visit: Payer: Self-pay | Admitting: Student

## 2022-01-23 DIAGNOSIS — E782 Mixed hyperlipidemia: Secondary | ICD-10-CM

## 2022-01-23 DIAGNOSIS — J8410 Pulmonary fibrosis, unspecified: Secondary | ICD-10-CM

## 2022-01-23 MED ORDER — ATORVASTATIN CALCIUM 80 MG PO TABS
80.0000 mg | ORAL_TABLET | Freq: Every day | ORAL | 2 refills | Status: DC
  Filled 2022-01-23: qty 30, 30d supply, fill #0
  Filled 2022-03-12: qty 30, 30d supply, fill #1
  Filled 2022-04-06: qty 30, 30d supply, fill #2

## 2022-01-23 MED ORDER — FLUTICASONE-SALMETEROL 500-50 MCG/ACT IN AEPB
1.0000 | INHALATION_SPRAY | Freq: Two times a day (BID) | RESPIRATORY_TRACT | 0 refills | Status: DC
  Filled 2022-01-23: qty 60, 30d supply, fill #0

## 2022-01-26 ENCOUNTER — Other Ambulatory Visit: Payer: Self-pay

## 2022-01-26 ENCOUNTER — Other Ambulatory Visit (HOSPITAL_COMMUNITY): Payer: Self-pay

## 2022-01-27 ENCOUNTER — Other Ambulatory Visit (HOSPITAL_COMMUNITY): Payer: Self-pay

## 2022-01-27 MED ORDER — METHOCARBAMOL 500 MG PO TABS
500.0000 mg | ORAL_TABLET | Freq: Four times a day (QID) | ORAL | 0 refills | Status: DC
  Filled 2022-01-27: qty 45, 12d supply, fill #0

## 2022-02-04 ENCOUNTER — Other Ambulatory Visit (HOSPITAL_COMMUNITY): Payer: Self-pay

## 2022-02-06 ENCOUNTER — Other Ambulatory Visit (HOSPITAL_COMMUNITY): Payer: Self-pay

## 2022-03-06 ENCOUNTER — Other Ambulatory Visit (HOSPITAL_COMMUNITY): Payer: Self-pay

## 2022-03-12 ENCOUNTER — Other Ambulatory Visit (HOSPITAL_COMMUNITY): Payer: Self-pay

## 2022-04-06 ENCOUNTER — Other Ambulatory Visit (HOSPITAL_COMMUNITY): Payer: Self-pay

## 2022-04-23 ENCOUNTER — Other Ambulatory Visit: Payer: Self-pay | Admitting: Neurosurgery

## 2022-04-23 DIAGNOSIS — M5440 Lumbago with sciatica, unspecified side: Secondary | ICD-10-CM

## 2022-04-27 ENCOUNTER — Other Ambulatory Visit: Payer: Medicare Other

## 2022-04-30 ENCOUNTER — Ambulatory Visit
Admission: RE | Admit: 2022-04-30 | Discharge: 2022-04-30 | Disposition: A | Discharge status: Home / Self Care | Payer: Medicare Other | Source: Ambulatory Visit | Attending: Neurosurgery | Admitting: Neurosurgery

## 2022-04-30 DIAGNOSIS — M5440 Lumbago with sciatica, unspecified side: Secondary | ICD-10-CM

## 2022-04-30 IMAGING — MR MR LUMBAR SPINE W/O CM
4 of 5 series · 27 of 48 positions shown · non-contrast
Comparison: None Available.

CLINICAL DATA: Left buttock and bilateral leg pain.

EXAM:
MRI LUMBAR SPINE WITHOUT CONTRAST
TECHNIQUE: Multiplanar, multisequence MR imaging of the lumbar spine was
performed. No intravenous contrast was administered.

[Series 3: T2 · sagittal · 4.0mm · 1.09mm/px · 6 of 17 slices shown (1 of 2)]
[im 1/17]
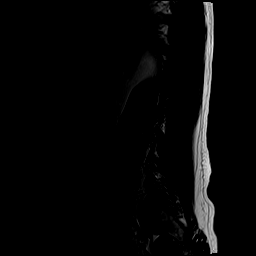
[im 4/17]
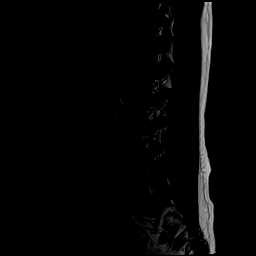
[im 7/17]
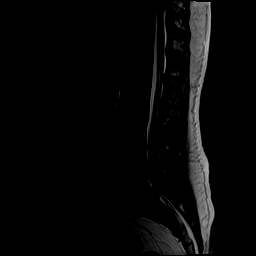
[im 10/17]
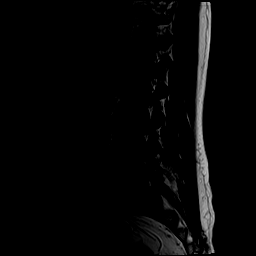
[im 13/17]
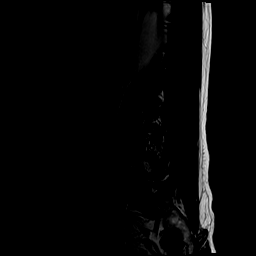
[im 17/17]
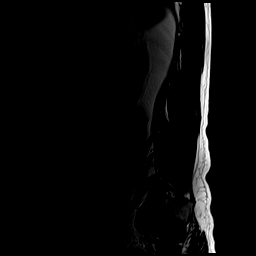

[Series 5: T1 · sagittal · 4.0mm · 1.09mm/px · 6 of 17 slices shown (1 of 2)]
[im 1/17]
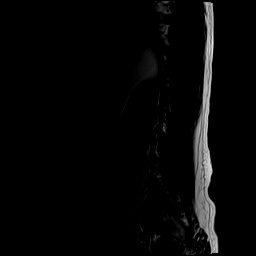
[im 4/17]
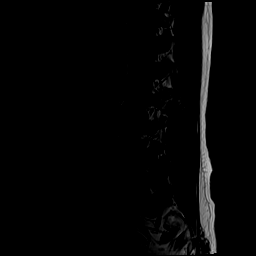
[im 7/17]
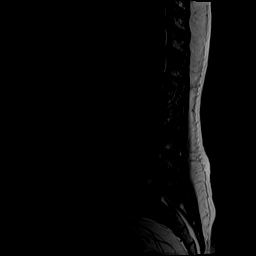
[im 10/17]
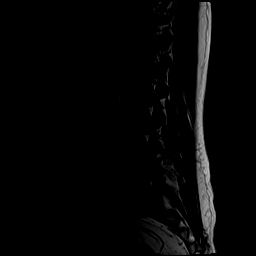
[im 13/17]
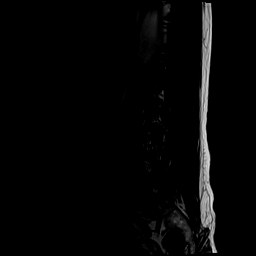
[im 17/17]
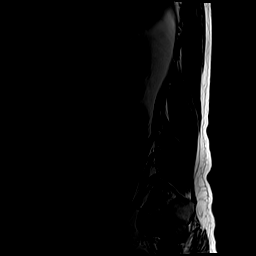

[Series 6: T2 · axial · 4.0mm · 0.39mm/px · z∈[-25,+171]mm · 9 of 40 slices shown (2 of 2)]
[im 1/40]
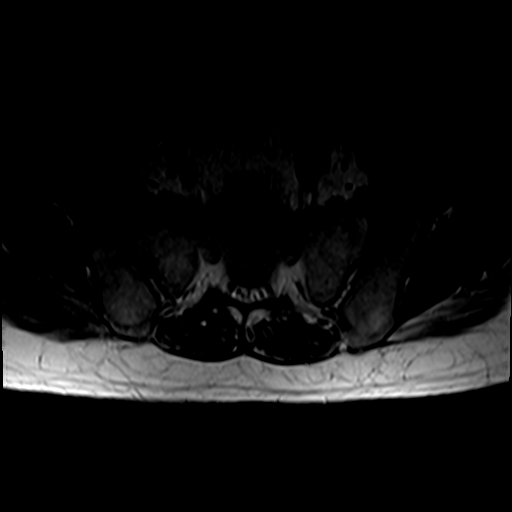
[im 6/40]
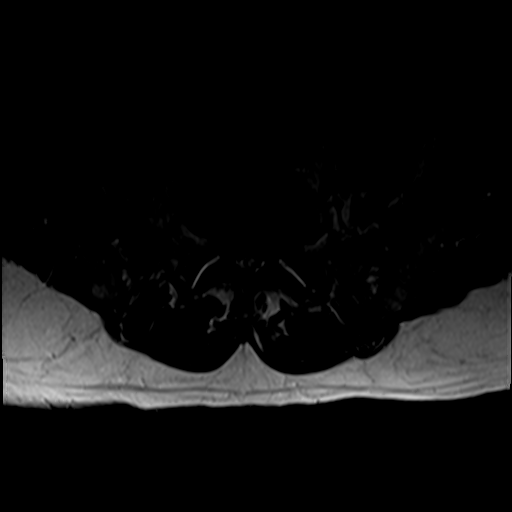
[im 12/40]
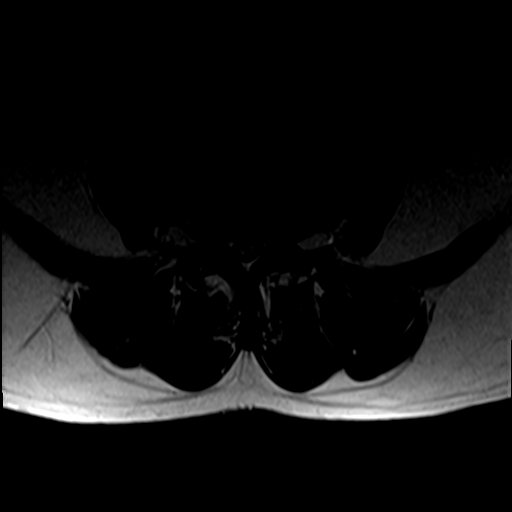
[im 17/40]
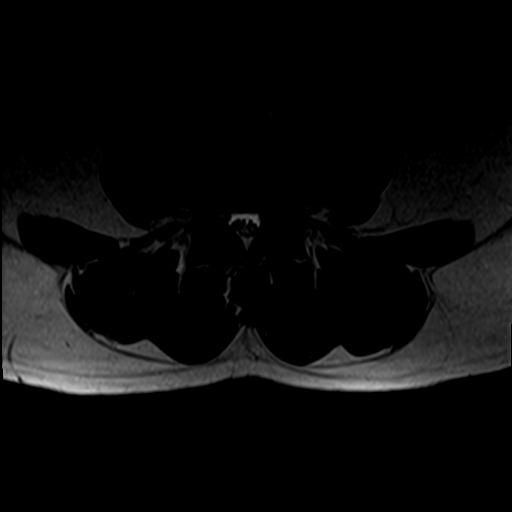
[im 20/40]
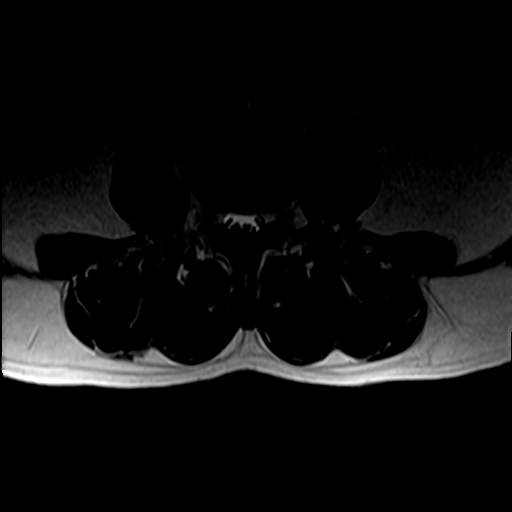
[im 23/40]
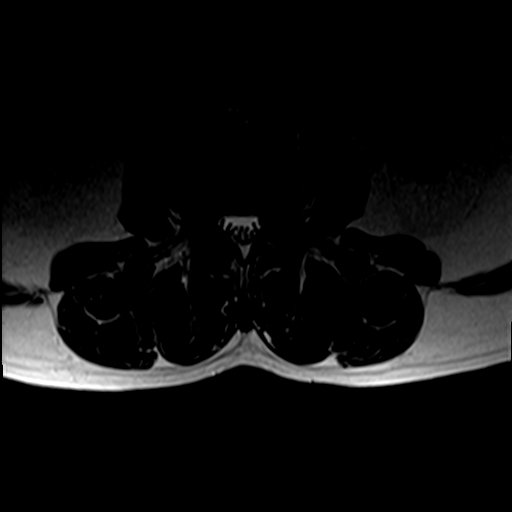
[im 28/40]
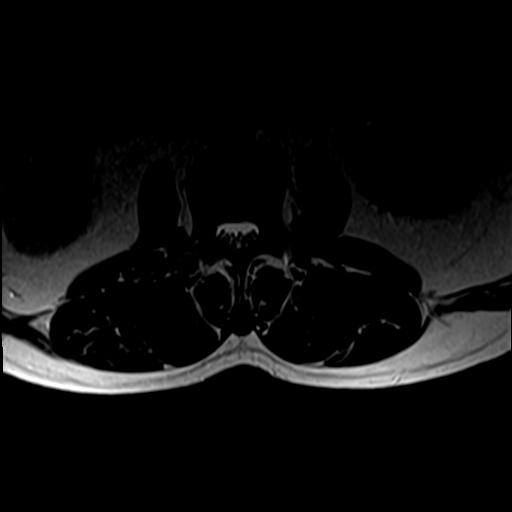
[im 34/40]
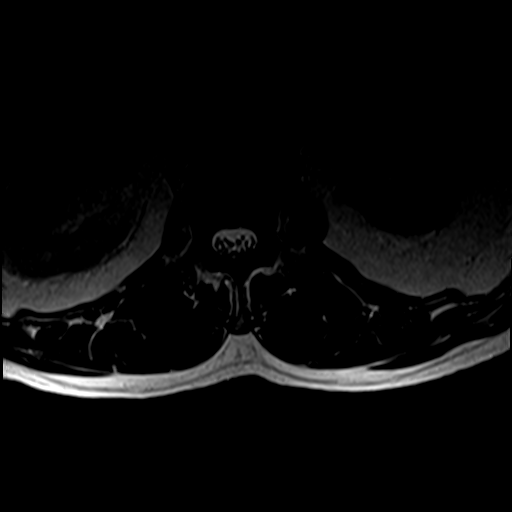
[im 40/40]
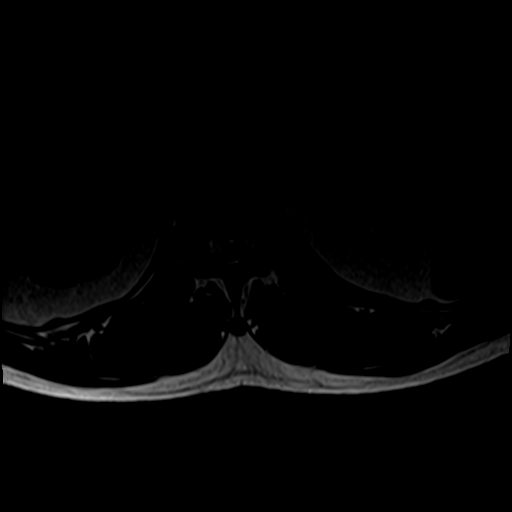

[Series 7: T1 · axial · 4.0mm · 0.39mm/px · z∈[-25,+142]mm · 6 of 40 slices shown (2 of 2)]
[im 1/40]
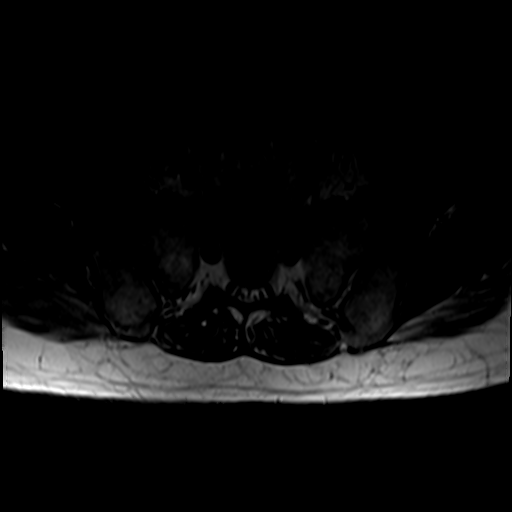
[im 6/40]
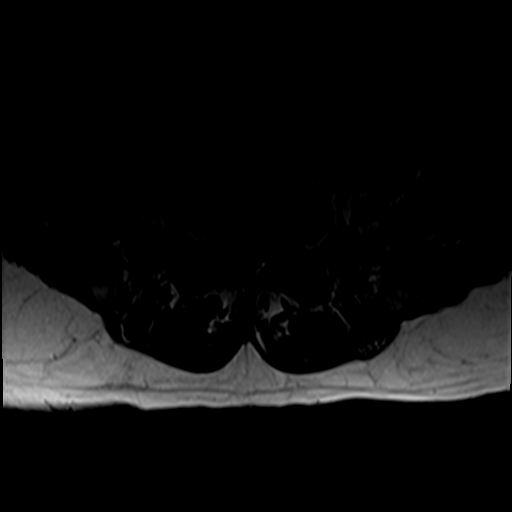
[im 12/40]
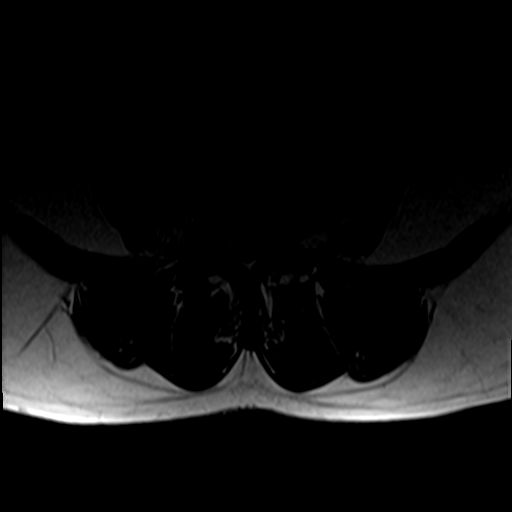
[im 17/40]
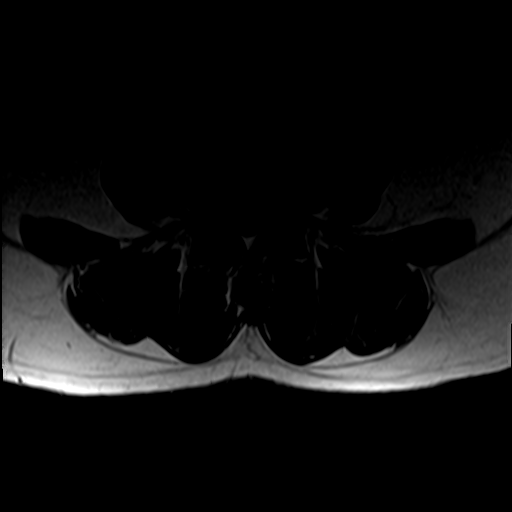
[im 20/40]
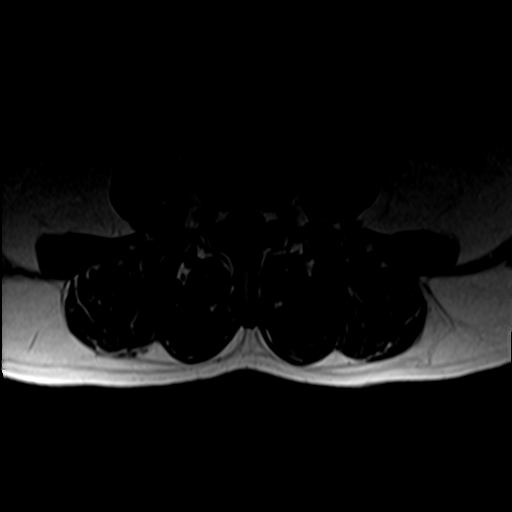
[im 34/40]
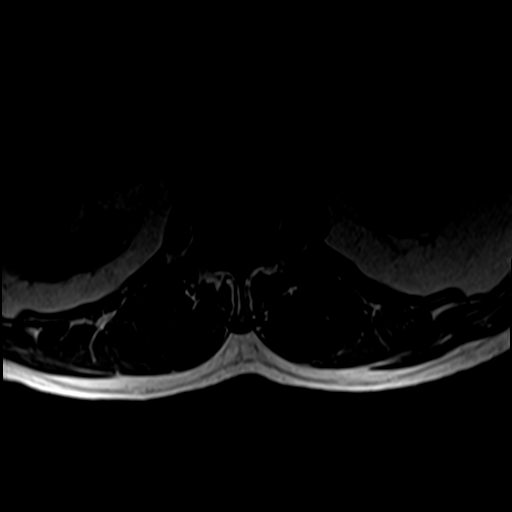

[27 of 48 positions shown; findings below may reference images not displayed]

FINDINGS: Segmentation: Standard segmentation is assumed. The inferior-most
fully formed intervertebral disc labeled L5-S1.

Alignment:  No substantial sagittal subluxation.

Vertebrae: Vertebral body heights are maintained. No focal marrow
edema to suggest acute fracture or discitis/osteomyelitis. No
suspicious bone lesions.

Conus medullaris and cauda equina: Conus extends to the upper L1
level. Conus appears normal.

Paraspinal and other soft tissues: Bilateral exophytic renal cysts.

Disc levels:

T12-L1: No significant disc protrusion, foraminal stenosis, or canal
stenosis.

L1-L2: No significant disc protrusion, foraminal stenosis, or canal
stenosis.

L2-L3: Mild disc desiccation. Mild disc bulging without significant
canal or foraminal stenosis.

L3-L4: Mild disc desiccation and height loss. Broad disc bulge. Mild
facet arthropathy. Mild bilateral subarticular recess stenosis
without significant canal or foraminal stenosis.

L4-L5: Disc desiccation and height loss. Broad disc bulging and
ligamentum flavum thickening. Moderate bilateral facet arthropathy.
Resulting moderate canal and bilateral subarticular recess stenosis,
potentially affecting the descending L5 nerve roots bilaterally.
Mild bilateral foraminal stenosis.

L5-S1: Broad disc bulging with moderate bilateral facet arthropathy.
Mild to moderate canal and bilateral subarticular recess stenosis
with mild mild to moderate left and mild right foraminal stenosis.
IMPRESSION: 1. At L4-L5, moderate canal and bilateral subarticular recess
stenosis, potentially affecting the descending L5 nerve roots
bilaterally. Mild bilateral foraminal stenosis.
2. At L5-S1, mild to moderate canal and bilateral subarticular
recess stenosis with mild mild to moderate left and mild right
foraminal stenosis.

## 2022-05-13 ENCOUNTER — Other Ambulatory Visit: Payer: Self-pay | Admitting: Student

## 2022-05-13 ENCOUNTER — Other Ambulatory Visit (HOSPITAL_COMMUNITY): Payer: Self-pay

## 2022-05-13 ENCOUNTER — Other Ambulatory Visit: Payer: Self-pay | Admitting: Internal Medicine

## 2022-05-13 DIAGNOSIS — M5412 Radiculopathy, cervical region: Secondary | ICD-10-CM

## 2022-05-13 DIAGNOSIS — K219 Gastro-esophageal reflux disease without esophagitis: Secondary | ICD-10-CM

## 2022-05-13 DIAGNOSIS — E782 Mixed hyperlipidemia: Secondary | ICD-10-CM

## 2022-05-13 MED ORDER — DULOXETINE HCL 60 MG PO CPEP
60.0000 mg | ORAL_CAPSULE | Freq: Every day | ORAL | 3 refills | Status: DC
  Filled 2022-05-13: qty 30, 30d supply, fill #0
  Filled 2022-06-17: qty 30, 30d supply, fill #1
  Filled 2022-07-24: qty 30, 30d supply, fill #2
  Filled 2022-08-24: qty 30, 30d supply, fill #3

## 2022-05-13 MED ORDER — ATORVASTATIN CALCIUM 80 MG PO TABS
80.0000 mg | ORAL_TABLET | Freq: Every day | ORAL | 2 refills | Status: DC
  Filled 2022-05-13: qty 30, 30d supply, fill #0
  Filled 2022-06-19: qty 30, 30d supply, fill #1
  Filled 2022-07-24: qty 30, 30d supply, fill #2

## 2022-05-13 MED ORDER — PANTOPRAZOLE SODIUM 40 MG PO TBEC
40.0000 mg | DELAYED_RELEASE_TABLET | Freq: Two times a day (BID) | ORAL | 2 refills | Status: DC
  Filled 2022-05-13: qty 60, 30d supply, fill #0
  Filled 2022-06-17: qty 60, 30d supply, fill #1
  Filled 2022-07-24: qty 60, 30d supply, fill #2

## 2022-06-17 ENCOUNTER — Other Ambulatory Visit (HOSPITAL_COMMUNITY): Payer: Self-pay

## 2022-06-17 ENCOUNTER — Other Ambulatory Visit: Payer: Self-pay | Admitting: Internal Medicine

## 2022-06-17 DIAGNOSIS — M5412 Radiculopathy, cervical region: Secondary | ICD-10-CM

## 2022-06-18 ENCOUNTER — Other Ambulatory Visit (HOSPITAL_COMMUNITY): Payer: Self-pay

## 2022-06-18 MED ORDER — GABAPENTIN 300 MG PO CAPS
900.0000 mg | ORAL_CAPSULE | Freq: Three times a day (TID) | ORAL | 2 refills | Status: DC
  Filled 2022-06-18: qty 270, 30d supply, fill #0
  Filled 2022-08-24: qty 270, 30d supply, fill #1
  Filled 2022-09-23: qty 270, 30d supply, fill #2

## 2022-06-19 ENCOUNTER — Other Ambulatory Visit (HOSPITAL_COMMUNITY): Payer: Self-pay

## 2022-06-24 ENCOUNTER — Encounter: Payer: Medicare Other | Admitting: Student

## 2022-07-06 ENCOUNTER — Encounter: Payer: Medicare Other | Admitting: Student

## 2022-07-07 ENCOUNTER — Other Ambulatory Visit (HOSPITAL_COMMUNITY): Payer: Self-pay

## 2022-07-07 ENCOUNTER — Ambulatory Visit (INDEPENDENT_AMBULATORY_CARE_PROVIDER_SITE_OTHER): Payer: Medicare Other | Admitting: Student

## 2022-07-07 ENCOUNTER — Encounter: Payer: Self-pay | Admitting: Student

## 2022-07-07 VITALS — BP 146/99 | HR 97 | Temp 98.0°F | Wt 149.8 lb

## 2022-07-07 DIAGNOSIS — J8410 Pulmonary fibrosis, unspecified: Secondary | ICD-10-CM | POA: Diagnosis present

## 2022-07-07 DIAGNOSIS — M5412 Radiculopathy, cervical region: Secondary | ICD-10-CM

## 2022-07-07 DIAGNOSIS — E0590 Thyrotoxicosis, unspecified without thyrotoxic crisis or storm: Secondary | ICD-10-CM

## 2022-07-07 DIAGNOSIS — E782 Mixed hyperlipidemia: Secondary | ICD-10-CM

## 2022-07-07 DIAGNOSIS — I10 Essential (primary) hypertension: Secondary | ICD-10-CM

## 2022-07-07 DIAGNOSIS — Z87891 Personal history of nicotine dependence: Secondary | ICD-10-CM | POA: Diagnosis not present

## 2022-07-07 MED ORDER — VALSARTAN 320 MG PO TABS
320.0000 mg | ORAL_TABLET | Freq: Every day | ORAL | 2 refills | Status: DC
  Filled 2022-07-07: qty 30, 30d supply, fill #0
  Filled 2022-08-24: qty 30, 30d supply, fill #1
  Filled 2022-09-23: qty 30, 30d supply, fill #2

## 2022-07-07 MED ORDER — FLUTICASONE-SALMETEROL 500-50 MCG/ACT IN AEPB
1.0000 | INHALATION_SPRAY | Freq: Two times a day (BID) | RESPIRATORY_TRACT | 2 refills | Status: DC
  Filled 2022-07-07: qty 60, 30d supply, fill #0
  Filled 2022-11-30: qty 60, 30d supply, fill #1
  Filled 2023-01-07: qty 60, 30d supply, fill #2

## 2022-07-07 MED ORDER — ALBUTEROL SULFATE HFA 108 (90 BASE) MCG/ACT IN AERS
2.0000 | INHALATION_SPRAY | Freq: Four times a day (QID) | RESPIRATORY_TRACT | 2 refills | Status: DC | PRN
  Filled 2022-07-07: qty 18, 25d supply, fill #0
  Filled 2022-11-30: qty 18, 25d supply, fill #1
  Filled 2023-01-07: qty 18, 25d supply, fill #2

## 2022-07-07 NOTE — Assessment & Plan Note (Addendum)
Patient underwent Anterior Cervical Discectomy and Fusion Cervical of C4-5 and C5-6 in January 2023.  He reports slight improvement of his pain.  He followed up with his neurosurgeon twice since the surgery.  Patient currently taking gabapentin and duloxetine which help with his neuropathy symptoms.  Patient has good 5/5 strength of bilateral upper extremities.  His anterior right surgical site appears well-healed and noninfected.  -Continue gabapentin and duloxetine -Advised patient to take Tylenol as needed for pain as well

## 2022-07-07 NOTE — Assessment & Plan Note (Signed)
Patient endorses mild shortness of breath with productive cough.  He has ran out of his inhaler for about 2 months ago.  Today patient appears comfortable and in no acute respiratory distress.  Satting well on room air.  There were mild expiratory wheezing in the bilateral lung bases but overall no symptoms of exacerbation.  -Refill Advair and albuterol as needed

## 2022-07-07 NOTE — Patient Instructions (Addendum)
[name],  It was nice seeing you in the clinic today.  Here is a summary what we talked about;  1.  I added valsartan to help with your blood pressure.  Please continue amlodipine.  2.  Please continue duloxetine and gabapentin for your neck pain.  You can also take Tylenol 500 mg tablet 2-3 times a day to help with pain relief.  3.  I refill your Advair and albuterol inhaler.  Please use Advair 2 times daily.  Only use the albuterol as needed for shortness of breath.  Please see me back in 1 month for blood pressure recheck and blood work  Please schedule his appointment with Dr. Alfonse Spruce  Take care,  Dr. Alfonse Spruce

## 2022-07-07 NOTE — Assessment & Plan Note (Signed)
Patient is currently taking atorvastatin 80 mg for primary prevention. Last LDL 02/2021 was 100  -Will recheck lipid panel in his 4-week follow-up

## 2022-07-07 NOTE — Progress Notes (Signed)
   CC: Medication refill and follow-up on cervical radiculopathy  HPI:  [name] is a 61 y.o. with past medical history of hypertension, hyperlipidemia, post-COVID pulmonary fibrosis, cervical radiculopathy, thalassemia who presented to the clinic today for medication refill and follow-up on cervical radiculopathy  Past Medical History:  Diagnosis Date   Anemia    CHF (congestive heart failure) ([place])    Dental infection 08/02/2018   GERD (gastroesophageal reflux disease)    Hypertension    Shingles rash 07/07/2019   Thalassemia, alpha ([place])    Viral gastritis 02/08/2019   Review of Systems:  per HPI  Physical Exam:  Vitals:   07/07/22 0914 07/07/22 0921  BP: (!) 157/94 (!) 146/99  Pulse: 98 97  Temp: 98 F (36.7 C)   TempSrc: Oral   SpO2: 98%   Weight: 149 lb 12.8 oz (67.9 kg)    Physical Exam Constitutional:      General: He is not in acute distress.    Appearance: He is not ill-appearing.  HENT:     Head: Normocephalic.  Eyes:     General:        Right eye: No discharge.        Left eye: No discharge.     Conjunctiva/sclera: Conjunctivae normal.  Cardiovascular:     Rate and Rhythm: Normal rate and regular rhythm.  Pulmonary:     Effort: Pulmonary effort is normal. No respiratory distress.     Comments: Mild expiratory wheezing in bilateral lung bases. Musculoskeletal:        General: Normal range of motion.  Skin:    General: Skin is warm.  Neurological:     General: No focal deficit present.     Mental Status: He is alert.     Comments: 5/5 strength bilateral upper extremities.  Psychiatric:        Mood and Affect: Mood normal.      Assessment & Plan:   See Encounters Tab for problem based charting.  Hypertension Blood pressure elevated at 146/99.  Patient brought all his medications with him and only has amlodipine 5 mg.  He is not sure about his valsartan. Last BMP 11/2021 was unremarkable  -Resume valsartan 320 mg -Follow-up in 4 weeks  for BP recheck and BMP  Postinflammatory pulmonary fibrosis ([place]) Patient endorses mild shortness of breath with productive cough.  He has ran out of his inhaler for about 2 months ago.  Today patient appears comfortable and in no acute respiratory distress.  Satting well on room air.  There were mild expiratory wheezing in the bilateral lung bases but overall no symptoms of exacerbation.  -Refill Advair and albuterol as needed  Hyperlipidemia Patient is currently taking atorvastatin 80 mg for primary prevention. Last LDL 02/2021 was 100  -Will recheck lipid panel in his 4-week follow-up  Cervical radiculopathy Patient underwent Anterior Cervical Discectomy and Fusion Cervical of C4-5 and C5-6 in January 2023.  He reports slight improvement of his pain.  He followed up with his neurosurgeon twice since the surgery.  Patient currently taking gabapentin and duloxetine which help with his neuropathy symptoms.  Patient has good 5/5 strength of bilateral upper extremities.  His anterior right surgical site appears well-healed and noninfected.  -Continue gabapentin and duloxetine -Advised patient to take Tylenol as needed for pain as well  Subclinical hyperthyroidism - We will recheck TSH, free T4 and T3 in his 4-week follow-up   Patient discussed with Dr. Daryll Drown

## 2022-07-07 NOTE — Assessment & Plan Note (Signed)
Blood pressure elevated at 146/99.  Patient brought all his medications with him and only has amlodipine 5 mg.  He is not sure about his valsartan. Last BMP 11/2021 was unremarkable  -Resume valsartan 320 mg -Follow-up in 4 weeks for BP recheck and BMP

## 2022-07-07 NOTE — Assessment & Plan Note (Signed)
-   We will recheck TSH, free T4 and T3 in his 4-week follow-up

## 2022-07-13 NOTE — Progress Notes (Signed)
Internal Medicine Clinic Attending  Case discussed with Dr. Nguyen  at the time of the visit.  We reviewed the resident's history and exam and pertinent patient test results.  I agree with the assessment, diagnosis, and plan of care documented in the resident's note.  

## 2022-07-24 ENCOUNTER — Other Ambulatory Visit (HOSPITAL_COMMUNITY): Payer: Self-pay

## 2022-08-05 ENCOUNTER — Other Ambulatory Visit (HOSPITAL_COMMUNITY): Payer: Self-pay

## 2022-08-05 ENCOUNTER — Encounter: Payer: Self-pay | Admitting: Student

## 2022-08-05 ENCOUNTER — Ambulatory Visit (INDEPENDENT_AMBULATORY_CARE_PROVIDER_SITE_OTHER): Payer: Medicare Other | Admitting: Student

## 2022-08-05 VITALS — BP 139/89 | HR 94 | Temp 98.4°F | Wt 149.2 lb

## 2022-08-05 DIAGNOSIS — I10 Essential (primary) hypertension: Secondary | ICD-10-CM

## 2022-08-05 DIAGNOSIS — E782 Mixed hyperlipidemia: Secondary | ICD-10-CM | POA: Diagnosis not present

## 2022-08-05 DIAGNOSIS — K921 Melena: Secondary | ICD-10-CM

## 2022-08-05 DIAGNOSIS — K219 Gastro-esophageal reflux disease without esophagitis: Secondary | ICD-10-CM | POA: Diagnosis not present

## 2022-08-05 DIAGNOSIS — M5412 Radiculopathy, cervical region: Secondary | ICD-10-CM | POA: Diagnosis not present

## 2022-08-05 DIAGNOSIS — E0590 Thyrotoxicosis, unspecified without thyrotoxic crisis or storm: Secondary | ICD-10-CM | POA: Diagnosis not present

## 2022-08-05 MED ORDER — FAMOTIDINE 20 MG PO TABS
20.0000 mg | ORAL_TABLET | Freq: Every day | ORAL | 2 refills | Status: DC
  Filled 2022-08-05: qty 30, 30d supply, fill #0
  Filled 2022-09-18: qty 30, 30d supply, fill #1
  Filled 2022-10-23: qty 30, 30d supply, fill #2

## 2022-08-05 NOTE — Assessment & Plan Note (Addendum)
Patient endorses melena over the last 2 days and has cleared up today.  No hematochezia.  Report worsening of heartburn symptoms in the last 20 days.  Reports poor appetite secondary to dyspepsia with weight loss.  He is currently taking pantoprazole 40 mg BID.  He takes 1 tablet first thing in the morning and the second tablet around 7 PM after his dinner.  Said that he has started taking Pepto-Bismol due to his severe symptoms.  He states that he took Pepto-Bismol after noticing the melena.  Patient has an EGD in 2019 which showed patchy moderate inflammation characterized by multiple erosions, erythema, friability and granularity was found in the gastric antrum.  Biopsy was negative for malignancy or H. Pylori.  Colonoscopy 2019 was unremarkable.  Given his worsening GERD symptoms and possible melena, I placed a referral to GI for reevaluation, possible repeat EGD.  In the meantime we will start Pepcid 20 mg daily to help with his symptoms.  Advised patient to take Protonix 30 minutes before meals.  We will also recheck CBC today.

## 2022-08-05 NOTE — Assessment & Plan Note (Signed)
-   Check TSH, free T4 and T3

## 2022-08-05 NOTE — Assessment & Plan Note (Signed)
Patient endorses persistent neck pain radiates to the left shoulder and posterior left scalp.  He is currently taking gabapentin and duloxetine with some relief.  He also take Tylenol as well.  Patient report mild numbness of the medial side of his left arm.  Patient has been doing light stretching exercise at home with relief.  Patient has C4-5 and C5-6 spinal stenosis underwent anterior cervical discectomy and fusion with Dr. Saintclair Halsted in 12/2021.  Physical exam showed normal strength, range of motion and intact sensation of bilateral upper extremities.  -Referral to PT for more exercise -Continue gabapentin and duloxetine.  He is already on a high dose of gabapentin so I hesitate to increase the dose. -Patient will follow-up with Dr. Saintclair Halsted the next day for further evaluation.

## 2022-08-05 NOTE — Assessment & Plan Note (Signed)
Blood pressure well controlled today 139/89.  -Continue amlodipine 5 mg and valsartan 320 mg.  I offered to start a combination pill but he declined. -Check BMP today

## 2022-08-05 NOTE — Assessment & Plan Note (Signed)
Patient is taking atorvastatin 80 mg for primary prevention - Recheck lipid panel

## 2022-08-05 NOTE — Progress Notes (Signed)
CC: Blood pressure recheck. Melena  HPI:  [name] is a 61 y.o. with past medical history of hypertension, post-COVID pulmonary fibrosis, cervical radiculopathy, who presents to the clinic for 1 month blood pressure recheck.  He also endorses persistent cervical radiculopathy and melena.  Please see problem based charting for detail  Past Medical History:  Diagnosis Date   Anemia    CHF (congestive heart failure) ([place])    Dental infection 08/02/2018   GERD (gastroesophageal reflux disease)    Hypertension    Shingles rash 07/07/2019   Thalassemia, alpha ([place])    Viral gastritis 02/08/2019   Review of Systems:  per HPI  Physical Exam:  Vitals:   08/05/22 1306  BP: 139/89  Pulse: 94  Temp: 98.4 F (36.9 C)  TempSrc: Oral  SpO2: 100%  Weight: 149 lb 3.2 oz (67.7 kg)   Physical Exam Constitutional:      General: He is not in acute distress.    Appearance: He is not ill-appearing.  HENT:     Head: Normocephalic.  Eyes:     General:        Right eye: No discharge.        Left eye: No discharge.     Conjunctiva/sclera: Conjunctivae normal.  Cardiovascular:     Rate and Rhythm: Normal rate and regular rhythm.     Heart sounds: Normal heart sounds.  Pulmonary:     Effort: Pulmonary effort is normal. No respiratory distress.     Breath sounds: Normal breath sounds. No wheezing.  Abdominal:     General: Bowel sounds are normal. There is no distension.     Palpations: Abdomen is soft.     Tenderness: There is no abdominal tenderness.  Musculoskeletal:        General: Normal range of motion.  Skin:    General: Skin is warm.  Neurological:     Mental Status: He is alert and oriented to person, place, and time.     Comments: 5/5 strength bilateral upper extremities.  Sensation intact.  Psychiatric:        Mood and Affect: Mood normal.      Assessment & Plan:   See Encounters Tab for problem based charting.  Hypertension Blood pressure well controlled  today 139/89.  -Continue amlodipine 5 mg and valsartan 320 mg.  I offered to start a combination pill but he declined. -Check BMP today  Melena Patient endorses melena over the last 2 days and has cleared up today.  No hematochezia.  Report worsening of heartburn symptoms in the last 20 days.  Reports poor appetite secondary to dyspepsia with weight loss.  He is currently taking pantoprazole 40 mg BID.  He takes 1 tablet first thing in the morning and the second tablet around 7 PM after his dinner.  Said that he has started taking Pepto-Bismol due to his severe symptoms.  He states that he took Pepto-Bismol after noticing the melena.  Patient has an EGD in 2019 which showed patchy moderate inflammation characterized by multiple erosions, erythema, friability and granularity was found in the gastric antrum.  Biopsy was negative for malignancy or H. Pylori.  Colonoscopy 2019 was unremarkable.  Given his worsening GERD symptoms and possible melena, I placed a referral to GI for reevaluation, possible repeat EGD.  In the meantime we will start Pepcid 20 mg daily to help with his symptoms.  Advised patient to take Protonix 30 minutes before meals.  We will also recheck CBC today.  Subclinical hyperthyroidism - Check TSH, free T4 and T3  Cervical radiculopathy Patient endorses persistent neck pain radiates to the left shoulder and posterior left scalp.  He is currently taking gabapentin and duloxetine with some relief.  He also take Tylenol as well.  Patient report mild numbness of the medial side of his left arm.  Patient has been doing light stretching exercise at home with relief.  Patient has C4-5 and C5-6 spinal stenosis underwent anterior cervical discectomy and fusion with Dr. Saintclair Halsted in 12/2021.  Physical exam showed normal strength, range of motion and intact sensation of bilateral upper extremities.  -Referral to PT for more exercise -Continue gabapentin and duloxetine.  He is already on a high  dose of gabapentin so I hesitate to increase the dose. -Patient will follow-up with Dr. Saintclair Halsted the next day for further evaluation.  Hyperlipidemia Patient is taking atorvastatin 80 mg for primary prevention - Recheck lipid panel   Patient discussed with Dr. Daryll Drown

## 2022-08-05 NOTE — Patient Instructions (Addendum)
[name],  It was nice seeing you in the clinic today.  Here is a summary what we talked about:  1.  Your blood pressure is well controlled.  Please continue amlodipine and valsartan.  I will check blood work for your kidney function today.  2.  I will send out a referral for physical therapy to help with your neck pain.  Please see Dr. Saintclair Halsted for reevaluation.  Continue duloxetine and gabapentin for your pain.  3.  Heartburn: I added Pepcid 20 mg daily to help with the symptoms.  Please take Protonix (pantoprazole) 30 minutes before your meal 2 times a day.  I placed a referral to GI for them to reevaluate your peptic ulcer disease.  4.  I will also check blood work for your thyroid number.  5.  I will check your cholesterol numbers today.  Please return in 3 months, sooner if needed  Take care,  Dr. Alfonse Spruce

## 2022-08-06 LAB — LIPID PANEL
Chol/HDL Ratio: 5 ratio (ref 0.0–5.0)
Cholesterol, Total: 165 mg/dL (ref 100–199)
HDL: 33 mg/dL — ABNORMAL LOW (ref >39)
LDL Chol Calc (NIH): 55 mg/dL (ref 0–99)
Triglycerides: 514 mg/dL — ABNORMAL HIGH (ref 0–149)
VLDL Cholesterol Cal: 77 mg/dL — ABNORMAL HIGH (ref 5–40)

## 2022-08-06 LAB — CBC
Hematocrit: 38.1 % (ref 37.5–51.0)
Hemoglobin: 12.1 g/dL — ABNORMAL LOW (ref 13.0–17.7)
MCH: 22.7 pg — ABNORMAL LOW (ref 26.6–33.0)
MCHC: 31.8 g/dL (ref 31.5–35.7)
MCV: 72 fL — ABNORMAL LOW (ref 79–97)
Platelets: 387 10*3/uL (ref 150–450)
RBC: 5.33 x10E6/uL (ref 4.14–5.80)
RDW: 17.3 % — ABNORMAL HIGH (ref 11.6–15.4)
WBC: 9 10*3/uL (ref 3.4–10.8)

## 2022-08-06 LAB — BMP8+ANION GAP
Anion Gap: 13 mmol/L (ref 10.0–18.0)
BUN/Creatinine Ratio: 10 (ref 10–24)
BUN: 10 mg/dL (ref 8–27)
CO2: 23 mmol/L (ref 20–29)
Calcium: 9 mg/dL (ref 8.6–10.2)
Chloride: 100 mmol/L (ref 96–106)
Creatinine, Ser: 1.03 mg/dL (ref 0.76–1.27)
Glucose: 92 mg/dL (ref 70–99)
Potassium: 4.2 mmol/L (ref 3.5–5.2)
Sodium: 136 mmol/L (ref 134–144)
eGFR: 83 mL/min/{1.73_m2} (ref >59)

## 2022-08-06 LAB — TSH: TSH: 1.72 u[IU]/mL (ref 0.450–4.500)

## 2022-08-06 LAB — T4, FREE: Free T4: 1.06 ng/dL (ref 0.82–1.77)

## 2022-08-06 LAB — T3: T3, Total: 69 ng/dL — ABNORMAL LOW (ref 71–180)

## 2022-08-12 NOTE — Progress Notes (Signed)
Internal Medicine Clinic Attending  Case discussed with Dr. Nguyen  at the time of the visit.  We reviewed the resident's history and exam and pertinent patient test results.  I agree with the assessment, diagnosis, and plan of care documented in the resident's note.  

## 2022-08-24 ENCOUNTER — Other Ambulatory Visit (HOSPITAL_COMMUNITY): Payer: Self-pay

## 2022-09-18 ENCOUNTER — Other Ambulatory Visit (HOSPITAL_COMMUNITY): Payer: Self-pay

## 2022-09-23 ENCOUNTER — Other Ambulatory Visit (HOSPITAL_COMMUNITY): Payer: Self-pay

## 2022-10-05 ENCOUNTER — Encounter: Payer: Self-pay | Admitting: Student

## 2022-10-05 ENCOUNTER — Ambulatory Visit (INDEPENDENT_AMBULATORY_CARE_PROVIDER_SITE_OTHER): Payer: Medicare Other | Admitting: Student

## 2022-10-05 ENCOUNTER — Other Ambulatory Visit (HOSPITAL_COMMUNITY): Payer: Self-pay

## 2022-10-05 DIAGNOSIS — E782 Mixed hyperlipidemia: Secondary | ICD-10-CM

## 2022-10-05 DIAGNOSIS — Z87891 Personal history of nicotine dependence: Secondary | ICD-10-CM

## 2022-10-05 DIAGNOSIS — I10 Essential (primary) hypertension: Secondary | ICD-10-CM

## 2022-10-05 DIAGNOSIS — K219 Gastro-esophageal reflux disease without esophagitis: Secondary | ICD-10-CM

## 2022-10-05 DIAGNOSIS — M5412 Radiculopathy, cervical region: Secondary | ICD-10-CM

## 2022-10-05 MED ORDER — DULOXETINE HCL 60 MG PO CPEP
60.0000 mg | ORAL_CAPSULE | Freq: Every day | ORAL | 11 refills | Status: DC
  Filled 2022-10-05: qty 30, 30d supply, fill #0
  Filled 2022-11-16: qty 30, 30d supply, fill #1
  Filled 2022-12-17: qty 30, 30d supply, fill #2
  Filled 2023-01-18: qty 30, 30d supply, fill #3
  Filled 2023-02-23: qty 30, 30d supply, fill #4
  Filled 2023-03-25: qty 30, 30d supply, fill #5
  Filled 2023-05-03: qty 30, 30d supply, fill #6
  Filled 2023-06-07: qty 30, 30d supply, fill #7
  Filled 2023-07-19: qty 30, 30d supply, fill #8
  Filled 2023-08-23: qty 30, 30d supply, fill #9
  Filled 2023-09-23: qty 30, 30d supply, fill #10

## 2022-10-05 MED ORDER — GABAPENTIN 300 MG PO CAPS
900.0000 mg | ORAL_CAPSULE | Freq: Three times a day (TID) | ORAL | 11 refills | Status: DC
  Filled 2022-10-05: qty 270, 30d supply, fill #0

## 2022-10-05 MED ORDER — VALSARTAN 320 MG PO TABS
320.0000 mg | ORAL_TABLET | Freq: Every day | ORAL | 11 refills | Status: DC
  Filled 2022-10-05 – 2022-11-16 (×2): qty 30, 30d supply, fill #0
  Filled 2022-12-17: qty 30, 30d supply, fill #1
  Filled 2023-01-18: qty 30, 30d supply, fill #2
  Filled 2023-02-23: qty 30, 30d supply, fill #3
  Filled 2023-03-25: qty 30, 30d supply, fill #4
  Filled 2023-05-03: qty 30, 30d supply, fill #5
  Filled 2023-06-07: qty 30, 30d supply, fill #6
  Filled 2023-07-19: qty 30, 30d supply, fill #7
  Filled 2023-08-23: qty 30, 30d supply, fill #8
  Filled 2023-09-23: qty 30, 30d supply, fill #9

## 2022-10-05 MED ORDER — PANTOPRAZOLE SODIUM 40 MG PO TBEC
40.0000 mg | DELAYED_RELEASE_TABLET | Freq: Two times a day (BID) | ORAL | 2 refills | Status: DC
  Filled 2022-10-05: qty 60, 30d supply, fill #0
  Filled 2022-11-16: qty 60, 30d supply, fill #1
  Filled 2022-12-17: qty 60, 30d supply, fill #2

## 2022-10-05 MED ORDER — ATORVASTATIN CALCIUM 80 MG PO TABS
80.0000 mg | ORAL_TABLET | Freq: Every day | ORAL | 11 refills | Status: DC
  Filled 2022-10-05: qty 30, 30d supply, fill #0
  Filled 2022-11-16: qty 30, 30d supply, fill #1
  Filled 2022-12-17: qty 30, 30d supply, fill #2
  Filled 2023-01-18: qty 30, 30d supply, fill #3
  Filled 2023-02-23: qty 30, 30d supply, fill #4
  Filled 2023-03-25: qty 30, 30d supply, fill #5
  Filled 2023-05-03: qty 30, 30d supply, fill #6
  Filled 2023-06-07: qty 30, 30d supply, fill #7
  Filled 2023-07-19: qty 30, 30d supply, fill #8
  Filled 2023-08-23: qty 30, 30d supply, fill #9
  Filled 2023-09-23: qty 30, 30d supply, fill #10

## 2022-10-05 NOTE — Patient Instructions (Signed)
[name],  It was a pleasure seeing you in the clinic today.   For your acid reflux, I have refilled your home pantoprazole to help. As we discussed, please make sure to take this medicine twice a day. Also, make sure to take the medicine about 30 minutes before you eat your meal. I have refilled your gabapentin and cymbalta to help with your chronic pain. Please make sure to try exercise and acupuncture as recommended by your spine surgery team. I have also refilled your cholesterol medicine (atorvastatin) and one of your blood pressure medicines (valsartan). Please come back in 2 months.  Please call our clinic at [phone] if you have any questions or concerns. The best time to call is Monday-Friday from 9am-4pm, but there is someone available 24/7 at the same number. If you need medication refills, please notify your pharmacy one week in advance and they will send [place] a request.   Thank you for letting us take part in your care. We look forward to seeing you next time!

## 2022-10-05 NOTE — Assessment & Plan Note (Signed)
Patient was found to have cervical radiculopathy and cervical spine stenosis s/p anterior cervical discectomy and fusion on 12/2021 with Dr. Saintclair Halsted. He has continued to have mild neck pain despite surgical intervention, noted during last visit with [place]. Per patient, they recommended exercise and acupuncture for management, neither of which he has tried yet. He has good strength, ROM, and sensation throughout on my exam. Does have mild stiffness and mild TTP of cervical spine. We discussed continuing gabapentin and cymbalta for management of his chronic neck pain. Also discussed trying acupuncture and incorporating exercise into lifestyle as recommended by [place]. Discussed that he may continue to experience baseline neck pain unfortunately, but will focus on functionality which seems at his usual baseline at this time.  Plan: -refilled gabapentin and cymbalta -exercise -acupuncture -f/u with [place] as needed -f/u in 2 months

## 2022-10-05 NOTE — Assessment & Plan Note (Signed)
Patient ran out of home protonix about 2 weeks ago and has experienced symptoms of reflux (sour taste, burning discomfort in central chest). He had an EGD in 2019 with findings of gastritis and negative H pylori. Colonoscopy at same time was unremarkable. He was taking protonix twice daily but was taking doses after meals. We discussed correct method of dosing and to take PPI about 30 minutes prior to meals. He was referred to GI for abdominal pain noted at prior visit, currently awaiting GI to schedule an appointment. He does not have any abdominal pain on my exam today.  Plan: -refilled protonix -awaiting GI appointment/evaluation -f/u in 2 months

## 2022-10-05 NOTE — Progress Notes (Signed)
   CC: GERD, chronic neck pain  HPI:  [name] is a 61 y.o. male with history listed below presenting to the [facility] for f/u of GERD and chronic neck pain. Please see individualized problem based charting for full HPI.  Past Medical History:  Diagnosis Date   Anemia    CHF (congestive heart failure) ([place])    Dental infection 08/02/2018   GERD (gastroesophageal reflux disease)    Hypertension    Shingles rash 07/07/2019   Thalassemia, alpha ([place])    Viral gastritis 02/08/2019    Review of Systems:  Negative aside from that listed in individualized problem based charting.  Physical Exam:  Vitals:   10/05/22 0958  BP: (!) 137/96  Pulse: 97  SpO2: 98%  Weight: 149 lb 12.8 oz (67.9 kg)  Height: '5\' 2"'$  (1.575 m)   Physical Exam Constitutional:      Appearance: Normal appearance. He is not ill-appearing.  HENT:     Nose: Nose normal. No congestion.     Mouth/Throat:     Mouth: Mucous membranes are moist.     Pharynx: Oropharynx is clear.  Eyes:     Extraocular Movements: Extraocular movements intact.     Conjunctiva/sclera: Conjunctivae normal.     Pupils: Pupils are equal, round, and reactive to light.  Neck:     Comments: Mild stiffness of neck, mild TTP of cervical spine. ROM intact. Cardiovascular:     Rate and Rhythm: Normal rate and regular rhythm.     Pulses: Normal pulses.     Heart sounds: Normal heart sounds. No murmur heard.    No friction rub. No gallop.  Pulmonary:     Effort: Pulmonary effort is normal.     Breath sounds: Normal breath sounds. No wheezing, rhonchi or rales.  Abdominal:     General: Bowel sounds are normal. There is no distension.     Palpations: Abdomen is soft.     Tenderness: There is no abdominal tenderness. There is no guarding or rebound.  Musculoskeletal:        General: No swelling. Normal range of motion.  Skin:    General: Skin is warm and dry.  Neurological:     General: No focal deficit present.     Mental Status: He is  alert and oriented to person, place, and time.  Psychiatric:        Mood and Affect: Mood normal.        Behavior: Behavior normal.      Assessment & Plan:   See Encounters Tab for problem based charting.  Patient discussed with Dr. Daryll Drown

## 2022-10-05 NOTE — Assessment & Plan Note (Signed)
Blood pressure relatively well controlled today but some diastolic elevation, 913/68. He is on norvasc '5mg'$  daily and valsartan '320mg'$  daily. If diastolic pressures elevated at next visit, can consider increasing norvasc to '10mg'$  daily for afterload reduction.  Plan: -continue norvasc -continue valsartan (refilled today)

## 2022-10-09 NOTE — Progress Notes (Signed)
Internal Medicine Clinic Attending  Case discussed with Dr. Jinwala at the time of the visit.  We reviewed the resident's history and exam and pertinent patient test results.  I agree with the assessment, diagnosis, and plan of care documented in the resident's note.  

## 2022-10-23 ENCOUNTER — Other Ambulatory Visit (HOSPITAL_COMMUNITY): Payer: Self-pay

## 2022-11-16 ENCOUNTER — Other Ambulatory Visit (HOSPITAL_COMMUNITY): Payer: Self-pay

## 2022-11-30 ENCOUNTER — Other Ambulatory Visit (HOSPITAL_COMMUNITY): Payer: Self-pay

## 2022-12-08 NOTE — Therapy (Signed)
OUTPATIENT PHYSICAL THERAPY CERVICAL EVALUATION   Patient Name: Eric Ingram MRN: [id number] DOB:[birth year], 61 y.o., male Today's Date: 12/09/2022  END OF SESSION:  PT End of Session - 12/09/22 1321     Visit Number 1    Number of Visits 9    Date for PT Re-Evaluation 02/03/23    Authorization Type Medicare AB    Progress Note Due on Visit 10    PT Start Time 1325    PT Stop Time 1419    PT Time Calculation (min) 54 min    Activity Tolerance Patient tolerated treatment well;No increased pain    Behavior During Therapy [facility] for tasks assessed/performed             Past Medical History:  Diagnosis Date   Anemia    CHF (congestive heart failure) ([place])    Dental infection 08/02/2018   GERD (gastroesophageal reflux disease)    Hypertension    Shingles rash 07/07/2019   Thalassemia, alpha (HCC)    Viral gastritis 02/08/2019   Past Surgical History:  Procedure Laterality Date   ANTERIOR CERVICAL DECOMP/DISCECTOMY FUSION N/A 12/19/2021   Procedure: Anterior Cervical Discectomy and Fusion Cervical Four-Five-Cervical Five-Six;  Surgeon: Kary Kos, MD;  Location: [facility];  Service: Neurosurgery;  Laterality: N/A;  3C   NO PAST SURGERIES     Patient Active Problem List   Diagnosis Date Noted   Stenosis of cervical spine with myelopathy ([place]) 04/25/2021   Facial swelling 04/18/2021   Leukocytosis 03/20/2021   Subclinical hyperthyroidism 02/05/2021   Asthma 08/26/2020   Postinflammatory pulmonary fibrosis ([place]) 08/26/2020   Hyperlipidemia 08/23/2020   Cervical radiculopathy 09/12/2019   Post herpetic neuralgia 08/29/2019   Adhesive capsulitis of both shoulders 08/29/2019   Gastro-esophageal reflux 07/18/2019   Melena 09/27/2018   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 [facility])    Chronic sinusitis 01/05/2018   Health care maintenance 01/05/2018   Hypertension 10/16/2017    PCP: Gaylan Gerold, DO  REFERRING PROVIDER: Eleonore Chiquito, NP  REFERRING  DIAG: M54.2 (ICD-10-CM) - Cervicalgia  THERAPY DIAG:  Cervicalgia  Abnormal posture  Rationale for Evaluation and Treatment: Rehabilitation  ONSET DATE: 2 months   SUBJECTIVE:                                                                                                                                                                                                         SUBJECTIVE STATEMENT: Appreciate assistance of in person interpreter, Y Hin, throughout session. Pt states about 2 months ago he had onset of neck pain,  preceded by shoulder pain. Both instances R more so than L.  Pt states that his symptoms get very tight throughout the shoulders, but sometimes refers into chest/stomach area.  Pt states that the symptoms begin in his neck/shoulders and then go down into stomach/chest typically, but occasionally goes the opposite. Notes that he also has difficulty breathing during these episodes and difficulty with bowel movements. States these episodes occur a few times a week and are  provoked by certain meals, most often rice. Pt states symptoms are not provoked by head movements, although he does note some crepitus with head movements.  Does describe joint pain B ankles, and L wrist, elbow that have all been ongoing.  Describes intermittent numbness from elbow to wrist on LUE that improves with exercise, worst at rest States he lifts weights and is typically very active which improves his neck/shoulder pain, states exercise does not evoke stomach/chest pain or other adverse symptoms.  Pt reports intermittent dizziness that is not brought about by head movements. Reports hearing "noise in head" (difficulty describing further) and reduced hearing in both ears recently. Also reports blurry vision when his pain is worse.  Pt notes that he follows up with PCP tomorrow and plans to discuss all these symptoms with them, but also mentions he does not think he described these symptoms to referring  provider.    PERTINENT HISTORY:  ACDF ~1 yr ago, CHF, GERD, HTN, asthma Pt states cardiac issues well controlled, sometimes has elevated HR but states he is in communication w/ doctor about it.   PAIN:  Are you having pain: yes, unable to rate on NPS Location: posterior, R>L occipital region, some pain throughout L arm  How would you describe your pain? Tightness in neck/head, numbness L elbow-wrist Aggravating factors:  - neck/head/chest/stomach pain provoked by ingestion of certain foods - neck/UE pain provoked by reduced activity levels and movement of shoulder Easing factors:  - neck/head/chest/stomach pain improved by positioning  - neck/UE pain improved with exercise and activity   PRECAUTIONS: None  WEIGHT BEARING RESTRICTIONS: No  FALLS:  Has patient fallen in last 6 months? No  LIVING ENVIRONMENT: With family in 1 story home no STE  OCCUPATION: not working currently  PLOF: Independent, describes himself as active, enjoys doing exercises  PATIENT GOALS: pt does not state  NEXT MD VISIT: sees PCP tomorrow (12/10/22)  OBJECTIVE:   DIAGNOSTIC FINDINGS:  None recently, s/p cervical ACDF ~1 yr ago  PATIENT SURVEYS:  Deferred given time constraints and interpreter services required  COGNITION: Overall cognitive status: Within functional limits for tasks assessed  SENSATION/NEURO: Light touch intact B UE aside from mild reduction in sensation ulnar aspect of LUE from elbow to wrist BLE light touch grossly intact No incoordination w/ finger<>nose testing No abnormal eye movements with saccades or smooth pursuits, no symptoms provoked Negative hoffmans   POSTURE: rounded shoulders and forward head  PALPATION: TTP B infraspinatus/teres with transient referral into UE to elbow (both sides). Nontender tightness B LS/UT. Mild tenderness suboccipitals  CERVICAL ROM:   Active ROM A/PROM (deg) eval  Flexion 100%  Extension 100%  Right lateral flexion 50%  crepitus  Left lateral flexion 50% crepitus  Right rotation   Left rotation    (Blank rows = not tested) Comments: no pain with cervical ROM  UPPER EXTREMITY ROM:  Active ROM Right eval Left eval  Shoulder flexion    Shoulder abduction    Shoulder internal rotation    Shoulder external rotation  Elbow flexion    Elbow extension    Wrist flexion    Wrist extension     (Blank rows = not tested) Comments: Grossly functional ROM shoulder elevation but provocation of UE symptoms, not formally measured   UPPER EXTREMITY MMT:  MMT Right eval Left eval  Shoulder flexion    Shoulder extension    Shoulder abduction    Shoulder extension    Shoulder internal rotation    Shoulder external rotation    Elbow flexion    Elbow extension    Grip strength     (Blank rows = not tested)   CERVICAL SPECIAL TESTS:  Deferred cervical special tests given presence of hardware (fusion ~1 yr ago)  VITALS: Seated at rest: HR 102-104, SpO2 95-97% RA, BP 141/94    PATIENT EDUCATION:  Education details: Informed consent. Extensive time spent with subjective and examination, time spent w/ discussion re: exam findings and symptom behavior, appropriate communication w/ PCP and referring provider re: symptoms communicated to this Probation officer during today's session Person educated: Patient Education method: Explanation, Demonstration, Tactile cues, Verbal cues Education comprehension: verbalized understanding, returned demonstration, verbal cues required, tactile cues required, and needs further education    HOME EXERCISE PROGRAM: Deferred HEP on this date given time constraints  ASSESSMENT:  CLINICAL IMPRESSION: Patient is a 61 y.o. gentleman who was seen today for physical therapy evaluation and treatment for neck pain, approximately 1 yr s/p cervical ACDF. During subjective, pt speaks primarily about stomach/chest/neck pain that occurs with feeling of breathlessness and constipation after  meals - denies any exertional cardiac symptoms, pt states he sees his PCP tomorrow and plans to discuss these symptoms with them, which is encouraged by this Probation officer.  Upon further questioning, pt does endorse R>L neck pain and shoulder pain (also R>L) that is worst at rest and improves with activity - pt states he is typically active and enjoys lifting weights, which improves pain levels and also improves report of numbness from L elbow to wrist. However, pt also endorses symptoms of dizziness, visual changes, and B hearing loss when neck pain is worst. He also endorses "bad dreams" and hearing constant noise in his head (which per his description seems distinct from crepitus, which he also reports with head movements). Pt states he has not discussed these symptoms with referring provider nor his PCP, advised pt to discuss with PCP tomorrow and call referring provider after session to notify them of these symptoms - he verbalizes agreement/understanding.  On examination pt does not demonstrate any overt red flags, has some mild cervical mobility limitations but painless. Concordant shoulder/UE pain provoked with palpation of GH/periscapular musculature as above. Somewhat limited examination on this date in favor of increased time spent with subjective given complexity of reported symptoms. Pt may benefit from trial of PT to address these symptoms, although at present would recommend further follow up with PCP and referring provider to discuss aforementioned symptoms prior to initiation of full PT treatment - this was discussed with pt and he verbalizes agreement/understanding with this plan. No adverse events, pt denies any overt symptoms on departure. Pt departs today's session in no acute distress, all voiced questions/concerns addressed appropriately from PT perspective.    OBJECTIVE IMPAIRMENTS: decreased ROM, hypomobility, increased muscle spasms, impaired sensation, postural dysfunction, and pain.    ACTIVITY LIMITATIONS: pt does not endorse any overt limitations during today's session  PARTICIPATION LIMITATIONS: pt does not endorse any overt limitations during today's session  PERSONAL FACTORS: 3+ comorbidities:  CHF, HTN, pulmonary fibrosis, asthma  are also affecting patient's functional outcome.   REHAB POTENTIAL: Fair given complexity of symptoms   CLINICAL DECISION MAKING: Unstable/unpredictable  EVALUATION COMPLEXITY: High   GOALS: Goals reviewed with patient? No  SHORT TERM GOALS: Target date: 12/30/2022    Pt will demonstrate appropriate understanding and performance of initially prescribed HEP in order to facilitate improved independence with management of symptoms.  Baseline: HEP deferred on eval Goal status: INITIAL    LONG TERM GOALS: Target date: 01/20/2023  Pt will report ability to self-manage symptoms through HEP without assistance in order to facilitate improved long term management of pain for maximized functional tolerance.  Baseline: improves w/ activity but persistent pain Goal status: INITIAL  2. Pt will report ability to perform typical daily activities w/ less than 2 pt increase in pain on NPS.   Baseline: unable to assess NPS on eval  Goal status: INITIAL  3. Pt will demonstrate symmetrical and functional shoulder AROM bilaterally with less than 2 pt increase in pain on NPS in order to improve tolerance to daily activities.   Baseline: provocation of shoulder pain/UE symptoms with [place] movement  Goal status: INITIAL   PLAN:  PT FREQUENCY: 1-2x/week (plan to follow up after pt discussion with provider re: symptoms, see assessment)  PT DURATION: 6 weeks  PLANNED INTERVENTIONS: Therapeutic exercises, Therapeutic activity, Neuromuscular re-education, Balance training, Gait training, Patient/Family education, Self Care, Joint mobilization, Dry Needling, Spinal mobilization, Cryotherapy, Moist heat, Taping, Manual therapy, and Re-evaluation  PLAN  FOR NEXT SESSION: assess symptoms further as indicated. Periscapular, cervical, [place] stabilization training. Manual as indicated.    Leeroy Cha PT, DPT 12/09/2022 5:33 PM

## 2022-12-09 ENCOUNTER — Ambulatory Visit: Payer: Medicare Other | Attending: Student | Admitting: Physical Therapy

## 2022-12-09 ENCOUNTER — Other Ambulatory Visit: Payer: Self-pay

## 2022-12-09 ENCOUNTER — Encounter: Payer: Self-pay | Admitting: Physical Therapy

## 2022-12-09 DIAGNOSIS — R293 Abnormal posture: Secondary | ICD-10-CM | POA: Insufficient documentation

## 2022-12-09 DIAGNOSIS — M542 Cervicalgia: Secondary | ICD-10-CM | POA: Diagnosis present

## 2022-12-10 ENCOUNTER — Other Ambulatory Visit (HOSPITAL_COMMUNITY): Payer: Self-pay

## 2022-12-10 ENCOUNTER — Encounter: Payer: Self-pay | Admitting: Student

## 2022-12-10 ENCOUNTER — Ambulatory Visit (INDEPENDENT_AMBULATORY_CARE_PROVIDER_SITE_OTHER): Payer: Medicare Other | Admitting: Student

## 2022-12-10 VITALS — BP 134/87 | HR 96 | Temp 98.4°F | Ht 62.0 in | Wt 150.5 lb

## 2022-12-10 DIAGNOSIS — M5412 Radiculopathy, cervical region: Secondary | ICD-10-CM | POA: Diagnosis not present

## 2022-12-10 DIAGNOSIS — Z87891 Personal history of nicotine dependence: Secondary | ICD-10-CM | POA: Diagnosis not present

## 2022-12-10 DIAGNOSIS — K219 Gastro-esophageal reflux disease without esophagitis: Secondary | ICD-10-CM

## 2022-12-10 DIAGNOSIS — I10 Essential (primary) hypertension: Secondary | ICD-10-CM | POA: Diagnosis not present

## 2022-12-10 DIAGNOSIS — J321 Chronic frontal sinusitis: Secondary | ICD-10-CM | POA: Diagnosis not present

## 2022-12-10 MED ORDER — GABAPENTIN 300 MG PO CAPS
900.0000 mg | ORAL_CAPSULE | Freq: Three times a day (TID) | ORAL | 11 refills | Status: DC
  Filled 2022-12-10: qty 270, 30d supply, fill #0
  Filled 2023-01-18: qty 270, 30d supply, fill #1
  Filled 2023-03-25: qty 270, 30d supply, fill #2
  Filled 2023-06-07: qty 270, 30d supply, fill #3

## 2022-12-10 NOTE — Patient Instructions (Addendum)
[name],  It was nice seeing you in clinic today.  Here is a summary what we talked about:  1.  Your blood pressure is well-controlled.  Please continue amlodipine and valsartan.  2.  Please call the number below to schedule an appointment for her stomach issue [facility] Gastroenterologist in [place], [place] in: [facility] [place] Address: [address] [place], [place], [place] [address] Phone: [phone]  3.  I am glad that your sinus symptoms have resolved.  Please stop taking amoxicillin.  You can try Flonase over-the-counter to help with inflammation in your nose.  3.  Please follow-up with physical therapy for your neck pain.  Please resume gabapentin to help with your nerve pain  Please return in 6 months  Take care,  Dr. Alfonse Spruce

## 2022-12-10 NOTE — Assessment & Plan Note (Signed)
Patient reports 2 weeks of acute sinusitis symptoms including rhinorrhea, bilateral ear pain, headache and blurry vision.  Said that his mucus was thick and purulent.  Patient took amoxicillin from his family for 2 weeks and reported improvement of symptoms.  Currently he denies fever, chills, rhinorrhea.  His only concern is the inflammation of his left nares.  HEENT exam was unremarkable.  There was no purulent discharge bilateral ears.  Bilateral tympanic membranes were unremarkable.  No rhinorrhea or mucus discharge from his nose.  There was mild erythema seen at the mucosal membrane of the left nares.  With improvement of his symptoms, I advised patient to stop amoxicillin.  Patient can use Flonase over-the-counter to help with inflammation of his sinusitis.

## 2022-12-10 NOTE — Progress Notes (Signed)
CC: Follow-up on GERD and cervical radiculopathy  HPI:  [name] is a 61 y.o. living with hypertension, GERD, post-COVID pulmonary fibrosis, cervical radiculopathy status post discectomy, who presents to the clinic to follow-up on his GERD symptoms and also cervical radiculopathy symptoms.  Please see problem based charting for detail  Past Medical History:  Diagnosis Date   Anemia    CHF (congestive heart failure) ([place])    Dental infection 08/02/2018   GERD (gastroesophageal reflux disease)    Hypertension    Shingles rash 07/07/2019   Thalassemia, alpha ([place])    Viral gastritis 02/08/2019   Review of Systems:  per HPI  Physical Exam:  Vitals:   12/10/22 0949  BP: 134/87  Pulse: 96  Temp: 98.4 F (36.9 C)  TempSrc: Oral  SpO2: 99%  Weight: 150 lb 8 oz (68.3 kg)  Height: '5\' 2"'$  (1.575 m)   Physical Exam Constitutional:      General: He is not in acute distress.    Appearance: He is not ill-appearing.  HENT:     Right Ear: Tympanic membrane, ear canal and external ear normal. There is no impacted cerumen.     Left Ear: Tympanic membrane, ear canal and external ear normal. There is no impacted cerumen.     Nose: No congestion or rhinorrhea.     Comments: Mild erythema at the mucosal membrane of the left nares Eyes:     General:        Right eye: No discharge.        Left eye: No discharge.     Conjunctiva/sclera: Conjunctivae normal.  Neck:     Comments: Mild tenderness to palpation bilateral paraspinal muscle Cardiovascular:     Rate and Rhythm: Normal rate and regular rhythm.  Pulmonary:     Effort: Pulmonary effort is normal. No respiratory distress.     Breath sounds: Normal breath sounds. No wheezing.  Skin:    General: Skin is warm.  Neurological:     Mental Status: He is alert.     Comments: PERRLA 5/5 strength of right upper extremity  4-5/5 left upper extremity grip strength  Psychiatric:        Mood and Affect: Mood normal.       Assessment & Plan:   See Encounters Tab for problem based charting.  Hypertension Blood pressure well-controlled 134/87.   Last BMP in August was normal  -Continue Norvasc 10 mg valsartan 320 mg  Chronic sinusitis Patient reports 2 weeks of acute sinusitis symptoms including rhinorrhea, bilateral ear pain, headache and blurry vision.  Said that his mucus was thick and purulent.  Patient took amoxicillin from his family for 2 weeks and reported improvement of symptoms.  Currently he denies fever, chills, rhinorrhea.  His only concern is the inflammation of his left nares.  HEENT exam was unremarkable.  There was no purulent discharge bilateral ears.  Bilateral tympanic membranes were unremarkable.  No rhinorrhea or mucus discharge from his nose.  There was mild erythema seen at the mucosal membrane of the left nares.  With improvement of his symptoms, I advised patient to stop amoxicillin.  Patient can use Flonase over-the-counter to help with inflammation of his sinusitis.  Gastro-esophageal reflux Patient was seen in August for worsening of his GERD symptoms and melena.  Colonoscopy in 2019 was unremarkable.  EGD in 2019 showed gastric inflammation.  H. pylori test was negative.  Due to worsening of his symptoms, patient was given a referral to GI  for possible repeat EGD.  GI office reached out to him and left a voicemail. Language barrier is an issue.  Today patient reports persistent distress epigastric pain and indigestion.  He is taking Protonix once a day, before meals, with some improvement.  He denies any NSAIDs use but has been taking amoxicillin for 2 weeks.  -Advised patient to take Protonix twice daily before meals -Phone number given to call GI office to make an appointment.  Advised patient to ask his daughter to call for an appointment.  I also reached out to his daughter and left a voicemail.  Cervical radiculopathy Patient reports persistent neck pain and shoulder pain  that radiates to his left arm.  Patient had cervical stenosis at C4-C6 that underwent anterior discectomy in January 2023.  Patient saw neurosurgery office in December 12 and was referred to physical therapy which he went yesterday.  Patient stated pain is persistent but improved slightly with exercise and stretching.  He is only takes Cymbalta right now for pain.  He was off of gabapentin due to fear that it would affect his stomach.  He is not taking any NSAIDs.  Physical exam showed good strength of bilateral upper extremities.  He reports numbness and tingling mostly in the C8 dermatome which is consistent with prior exam.  There are no worsening symptoms  -Advised patient to resume gabapentin to help with his radiculopathy symptoms -Continue Cymbalta -Follow-up with PT on 1/17 -Follow-up with neurosurgery as scheduled   Patient discussed with Dr. Evette Doffing

## 2022-12-10 NOTE — Assessment & Plan Note (Signed)
Patient reports persistent neck pain and shoulder pain that radiates to his left arm.  Patient had cervical stenosis at C4-C6 that underwent anterior discectomy in January 2023.  Patient saw neurosurgery office in December 12 and was referred to physical therapy which he went yesterday.  Patient stated pain is persistent but improved slightly with exercise and stretching.  He is only takes Cymbalta right now for pain.  He was off of gabapentin due to fear that it would affect his stomach.  He is not taking any NSAIDs.  Physical exam showed good strength of bilateral upper extremities.  He reports numbness and tingling mostly in the C8 dermatome which is consistent with prior exam.  There are no worsening symptoms  -Advised patient to resume gabapentin to help with his radiculopathy symptoms -Continue Cymbalta -Follow-up with PT on 1/17 -Follow-up with neurosurgery as scheduled

## 2022-12-10 NOTE — Assessment & Plan Note (Signed)
Blood pressure well-controlled 134/87.   Last BMP in August was normal  -Continue Norvasc 10 mg valsartan 320 mg

## 2022-12-10 NOTE — Assessment & Plan Note (Addendum)
Patient was seen in August for worsening of his GERD symptoms and melena.  Colonoscopy in 2019 was unremarkable.  EGD in 2019 showed gastric inflammation.  H. pylori test was negative.  Due to worsening of his symptoms, patient was given a referral to GI for possible repeat EGD.  GI office reached out to him and left a voicemail. Language barrier is an issue.  Today patient reports persistent distress epigastric pain and indigestion.  He is taking Protonix once a day, before meals, with some improvement.  He denies any NSAIDs use but has been taking amoxicillin for 2 weeks.  -Advised patient to take Protonix twice daily before meals -Phone number given to call GI office to make an appointment.  Advised patient to ask his daughter to call for an appointment.  I also reached out to his daughter and left a voicemail.

## 2022-12-11 ENCOUNTER — Other Ambulatory Visit (HOSPITAL_COMMUNITY): Payer: Self-pay

## 2022-12-11 NOTE — Progress Notes (Signed)
Internal Medicine Clinic Attending  Case discussed with Dr. Nguyen  At the time of the visit.  We reviewed the resident's history and exam and pertinent patient test results.  I agree with the assessment, diagnosis, and plan of care documented in the resident's note. 

## 2022-12-16 ENCOUNTER — Encounter: Payer: Self-pay | Admitting: Gastroenterology

## 2022-12-17 ENCOUNTER — Other Ambulatory Visit: Payer: Self-pay | Admitting: Student

## 2022-12-17 ENCOUNTER — Other Ambulatory Visit: Payer: Self-pay

## 2022-12-17 ENCOUNTER — Other Ambulatory Visit (HOSPITAL_COMMUNITY): Payer: Self-pay

## 2022-12-17 DIAGNOSIS — I10 Essential (primary) hypertension: Secondary | ICD-10-CM

## 2022-12-17 MED ORDER — AMLODIPINE BESYLATE 5 MG PO TABS
5.0000 mg | ORAL_TABLET | Freq: Every day | ORAL | 11 refills | Status: DC
  Filled 2022-12-17: qty 30, 30d supply, fill #0
  Filled 2023-01-18: qty 30, 30d supply, fill #1
  Filled 2023-02-23: qty 30, 30d supply, fill #2
  Filled 2023-03-25: qty 30, 30d supply, fill #3
  Filled 2023-05-03: qty 30, 30d supply, fill #4
  Filled 2023-06-07: qty 30, 30d supply, fill #5
  Filled 2023-09-23: qty 30, 30d supply, fill #6
  Filled 2023-10-28: qty 30, 30d supply, fill #7

## 2022-12-18 ENCOUNTER — Other Ambulatory Visit (HOSPITAL_COMMUNITY): Payer: Self-pay

## 2022-12-28 NOTE — Therapy (Addendum)
OUTPATIENT PHYSICAL THERAPY TREATMENT NOTE + NO VISIT DISCHARGE (see below)   Patient Name: Eric Ingram MRN: [id number] DOB:[birth year], 62 y.o., male Today's Date: 12/30/2022  PCP: Gaylan Gerold, DO   REFERRING PROVIDER: Eleonore Chiquito, NP  END OF SESSION:   PT End of Session - 12/30/22 0934     Visit Number 2    Number of Visits 9    Date for PT Re-Evaluation 02/03/23    Authorization Type Medicare AB    Progress Note Due on Visit 10    PT Start Time 0935   pt late arrival   PT Stop Time 1005    PT Time Calculation (min) 30 min    Activity Tolerance Patient tolerated treatment well;No increased pain    Behavior During Therapy [facility] for tasks assessed/performed             Past Medical History:  Diagnosis Date   Anemia    CHF (congestive heart failure) ([place])    Dental infection 08/02/2018   GERD (gastroesophageal reflux disease)    Hypertension    Shingles rash 07/07/2019   Thalassemia, alpha (HCC)    Viral gastritis 02/08/2019   Past Surgical History:  Procedure Laterality Date   ANTERIOR CERVICAL DECOMP/DISCECTOMY FUSION N/A 12/19/2021   Procedure: Anterior Cervical Discectomy and Fusion Cervical Four-Five-Cervical Five-Six;  Surgeon: Kary Kos, MD;  Location: [facility];  Service: Neurosurgery;  Laterality: N/A;  3C   NO PAST SURGERIES     Patient Active Problem List   Diagnosis Date Noted   Stenosis of cervical spine with myelopathy ([employer]) 04/25/2021   Facial swelling 04/18/2021   Leukocytosis 03/20/2021   Subclinical hyperthyroidism 02/05/2021   Asthma 08/26/2020   Postinflammatory pulmonary fibrosis ([place]) 08/26/2020   Hyperlipidemia 08/23/2020   Cervical radiculopathy 09/12/2019   Post herpetic neuralgia 08/29/2019   Adhesive capsulitis of both shoulders 08/29/2019   Gastro-esophageal reflux 07/18/2019   Melena 09/27/2018   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   PRECAUTIONS: none (previous ACDF ~1 yr ago)  SUBJECTIVE:  SUBJECTIVE STATEMENT:   12/30/2022 Pt states he saw his family doctor to discuss symptoms, states they are referring him for more work up. States he was not able to get in touch with referring provider to discuss symptoms. Denies any significant change in symptoms since initial evaluation.  Continues to report good relief from self massage and banded exercises. Appreciate assistance of in person interpreter throughout session.   Eval - Appreciate assistance of in person interpreter, Y Hin, throughout session. Pt states about 2 months ago he had onset of neck pain, preceded by shoulder pain. Both instances R more so than L.  Pt states that his symptoms get very tight throughout the shoulders, but sometimes refers into chest/stomach area.  Pt states that the symptoms begin in his neck/shoulders and then go down into stomach/chest typically, but occasionally goes the opposite. Notes that he also has difficulty breathing during these episodes and difficulty with bowel movements. States these episodes occur a few times a week and are  provoked by certain meals, most often rice. Pt states symptoms are not provoked by head movements, although he does note some crepitus with head movements.  Does describe joint pain B ankles, and L wrist, elbow that have all been ongoing.  Describes intermittent numbness from  elbow to wrist on LUE that improves with exercise, worst at rest States he lifts weights and is typically very active which improves his neck/shoulder pain, states exercise does not evoke stomach/chest pain or other adverse symptoms.  Pt reports intermittent dizziness that is not brought about by head movements. Reports hearing "noise in head" (difficulty describing further) and reduced hearing in both ears recently. Also reports blurry vision when his pain is worse.  Pt notes that he follows up with PCP tomorrow and plans to discuss all these symptoms with them, but also mentions he does not think he described these symptoms to referring provider.  PAIN:  Are you having pain: yes Location: posterior, R>L occipital region, some pain throughout L arm  How would you describe your pain? Tightness in neck/head, numbness L elbow-wrist Aggravating factors:  - neck/head/chest/stomach pain provoked by ingestion of certain foods - neck/UE pain provoked by reduced activity levels and movement of shoulder Easing factors:            - neck/head/chest/stomach pain improved by positioning            - neck/UE pain improved with exercise and activity    OBJECTIVE: (objective measures completed at initial evaluation unless otherwise dated)   DIAGNOSTIC FINDINGS:  None recently, s/p cervical ACDF ~1 yr ago   PATIENT SURVEYS:  Deferred given time constraints and interpreter services required   COGNITION: Overall cognitive status: Within functional limits for tasks assessed   SENSATION/NEURO: Light touch intact B UE aside from mild reduction in sensation ulnar aspect of LUE from elbow to wrist BLE light touch grossly intact No incoordination w/ finger<>nose testing No abnormal eye movements with saccades or smooth pursuits, no symptoms provoked Negative hoffmans     POSTURE: rounded shoulders and forward head   PALPATION: TTP B infraspinatus/teres with transient referral into UE to elbow (both  sides). Nontender tightness B LS/UT. Mild tenderness suboccipitals   CERVICAL ROM:    Active ROM A/PROM (deg) eval  Flexion 100%  Extension 100%  Right lateral flexion 50% crepitus  Left lateral flexion 50% crepitus  Right rotation    Left rotation     (Blank rows = not tested) Comments: no pain with  cervical ROM   UPPER EXTREMITY ROM:   Active ROM Right eval Left eval  Shoulder flexion      Shoulder abduction      Shoulder internal rotation      Shoulder external rotation      Elbow flexion      Elbow extension      Wrist flexion      Wrist extension       (Blank rows = not tested) Comments: Grossly functional ROM shoulder elevation but provocation of UE symptoms, not formally measured    UPPER EXTREMITY MMT:   MMT Right eval Left eval  Shoulder flexion      Shoulder extension      Shoulder abduction      Shoulder extension      Shoulder internal rotation      Shoulder external rotation      Elbow flexion      Elbow extension      Grip strength       (Blank rows = not tested)    CERVICAL SPECIAL TESTS:  Deferred cervical special tests given presence of hardware (fusion ~1 yr ago)   VITALS: Seated at rest: HR 102-104, SpO2 95-97% RA, BP 141/94    TODAY'S TREATMENT: [facility] Adult PT Treatment:                                                DATE: 12/30/22 Therapeutic Exercise: Scapular retraction 3x10 significant cues for reduced UT compensations  Scapular depression 2x10 cues for form and breath control Manual Therapy: STM B UT, LS, rhomboids, mid trap in seated position      PATIENT EDUCATION:  Education details: significant time spent discussing symptoms and encouraging communication with providers, monitoring symptoms Person educated: Patient Education method: Explanation, Demonstration, Tactile cues, Verbal cues Education comprehension: verbalized understanding, returned demonstration, verbal cues required, tactile cues required, and needs further  education     HOME EXERCISE PROGRAM: Deferred formal HEP until pt able to follow up with provider, states he is already performing exercises at home   ASSESSMENT:   CLINICAL IMPRESSION: 12/30/2022 Pt arrives w/ report of baseline pain, no overt changes in symptoms. States he spoke with PCP about his symptoms and they are referring him for further work up, states he was unable to get in touch with referring provider (this Probation officer also unsuccessful in attempts to reach referring provider after eval). Today pt reports only pain/tightness, no other symptoms. Pt continues to endorse concordant pain/tightness with palpation of UT/LS and rhomboids B, which he reports improves notably with manual as above. With performance of gentle periscapular activity, pt is noted to have heavy compensations at UT requiring significant cues to correct, but does improve with repetition. Pt reports improvement in pain with manual and exercise as above although he notes his activity at home is much more strenuous, suspect he may be heavily compensating with upper traps based on today's performance. Further time spent discussing pt symptoms, at end of session he states he plans to see referring provider after today's visit. Pt states he would like to hold off on scheduling further appointments until he has spoken with referring provider which is respected. Education provided on safety w/ activity, monitoring red flag symptoms, and communication w/ appropriate providers. No adverse events, pt departs with improved symptoms in no acute distress, all voiced  questions/concerns addressed appropriately from PT perspective.    Eval - Patient is a 62 y.o. gentleman who was seen today for physical therapy evaluation and treatment for neck pain, approximately 1 yr s/p cervical ACDF. During subjective, pt speaks primarily about stomach/chest/neck pain that occurs with feeling of breathlessness and constipation after meals - denies any  exertional cardiac symptoms, pt states he sees his PCP tomorrow and plans to discuss these symptoms with them, which is encouraged by this Probation officer.  Upon further questioning, pt does endorse R>L neck pain and shoulder pain (also R>L) that is worst at rest and improves with activity - pt states he is typically active and enjoys lifting weights, which improves pain levels and also improves report of numbness from L elbow to wrist. However, pt also endorses symptoms of dizziness, visual changes, and B hearing loss when neck pain is worst. He also endorses "bad dreams" and hearing constant noise in his head (which per his description seems distinct from crepitus, which he also reports with head movements). Pt states he has not discussed these symptoms with referring provider nor his PCP, advised pt to discuss with PCP tomorrow and call referring provider after session to notify them of these symptoms - he verbalizes agreement/understanding.  On examination pt does not demonstrate any overt red flags, has some mild cervical mobility limitations but painless. Concordant shoulder/UE pain provoked with palpation of GH/periscapular musculature as above. Somewhat limited examination on this date in favor of increased time spent with subjective given complexity of reported symptoms. Pt may benefit from trial of PT to address these symptoms, although at present would recommend further follow up with PCP and referring provider to discuss aforementioned symptoms prior to initiation of full PT treatment - this was discussed with pt and he verbalizes agreement/understanding with this plan. No adverse events, pt denies any overt symptoms on departure. Pt departs today's session in no acute distress, all voiced questions/concerns addressed appropriately from PT perspective.     OBJECTIVE IMPAIRMENTS: decreased ROM, hypomobility, increased muscle spasms, impaired sensation, postural dysfunction, and pain.    ACTIVITY LIMITATIONS:  pt does not endorse any overt limitations during today's session   PARTICIPATION LIMITATIONS: pt does not endorse any overt limitations during today's session   PERSONAL FACTORS: 3+ comorbidities: CHF, HTN, pulmonary fibrosis, asthma  are also affecting patient's functional outcome.    REHAB POTENTIAL: Fair given complexity of symptoms    CLINICAL DECISION MAKING: Unstable/unpredictable   EVALUATION COMPLEXITY: High     GOALS: Goals reviewed with patient? No   SHORT TERM GOALS: Target date: 12/30/2022     Pt will demonstrate appropriate understanding and performance of initially prescribed HEP in order to facilitate improved independence with management of symptoms.  Baseline: HEP deferred on eval Goal status: INITIAL      LONG TERM GOALS: Target date: 01/20/2023   Pt will report ability to self-manage symptoms through HEP without assistance in order to facilitate improved long term management of pain for maximized functional tolerance.  Baseline: improves w/ activity but persistent pain Goal status: INITIAL   2. Pt will report ability to perform typical daily activities w/ less than 2 pt increase in pain on NPS.             Baseline: unable to assess NPS on eval            Goal status: INITIAL   3. Pt will demonstrate symmetrical and functional shoulder AROM bilaterally with less than 2 pt increase in  pain on NPS in order to improve tolerance to daily activities.             Baseline: provocation of shoulder pain/UE symptoms with [place] movement            Goal status: INITIAL     PLAN:   PT FREQUENCY: 1-2x/week (plan to follow up after pt discussion with provider re: symptoms, see assessment)   PT DURATION: 6 weeks   PLANNED INTERVENTIONS: Therapeutic exercises, Therapeutic activity, Neuromuscular re-education, Balance training, Gait training, Patient/Family education, Self Care, Joint mobilization, Dry Needling, Spinal mobilization, Cryotherapy, Moist heat, Taping, Manual  therapy, and Re-evaluation   PLAN FOR NEXT SESSION: pt states he will call office to schedule further appts after following up with referring provider   Leeroy Cha PT, DPT 12/30/2022 11:43 AM    Addendum 02/16/2023 :   PHYSICAL THERAPY DISCHARGE SUMMARY  Visits from Start of Care: 2  Current functional level related to goals / functional outcomes: Unable to assess   Remaining deficits: Unknown - see above for most recent assessment    Education / Equipment: Unable to assess   Patient unable to agree to discharge due to lack of follow up. Patient goals were  unable to be assessed . Patient is being discharged due to not returning since the last visit.  Leeroy Cha PT, DPT 02/16/2023 2:38 PM

## 2022-12-30 ENCOUNTER — Encounter: Payer: Self-pay | Admitting: Physical Therapy

## 2022-12-30 ENCOUNTER — Ambulatory Visit: Payer: Medicare Other | Attending: Student | Admitting: Physical Therapy

## 2022-12-30 DIAGNOSIS — R293 Abnormal posture: Secondary | ICD-10-CM | POA: Diagnosis present

## 2022-12-30 DIAGNOSIS — M542 Cervicalgia: Secondary | ICD-10-CM | POA: Diagnosis not present

## 2023-01-07 ENCOUNTER — Other Ambulatory Visit (HOSPITAL_COMMUNITY): Payer: Self-pay

## 2023-01-18 ENCOUNTER — Other Ambulatory Visit (HOSPITAL_COMMUNITY): Payer: Self-pay

## 2023-01-25 ENCOUNTER — Ambulatory Visit (INDEPENDENT_AMBULATORY_CARE_PROVIDER_SITE_OTHER): Payer: Medicare Other | Admitting: Gastroenterology

## 2023-01-25 ENCOUNTER — Other Ambulatory Visit (INDEPENDENT_AMBULATORY_CARE_PROVIDER_SITE_OTHER): Payer: Medicare Other

## 2023-01-25 ENCOUNTER — Encounter: Payer: Self-pay | Admitting: Gastroenterology

## 2023-01-25 ENCOUNTER — Other Ambulatory Visit (HOSPITAL_COMMUNITY): Payer: Self-pay

## 2023-01-25 VITALS — BP 128/76 | HR 92 | Ht 62.0 in | Wt 153.8 lb

## 2023-01-25 DIAGNOSIS — Z86010 Personal history of colonic polyps: Secondary | ICD-10-CM

## 2023-01-25 DIAGNOSIS — R195 Other fecal abnormalities: Secondary | ICD-10-CM

## 2023-01-25 DIAGNOSIS — R1013 Epigastric pain: Secondary | ICD-10-CM

## 2023-01-25 DIAGNOSIS — K921 Melena: Secondary | ICD-10-CM | POA: Diagnosis not present

## 2023-01-25 LAB — CBC WITH DIFFERENTIAL/PLATELET
Basophils Absolute: 0.1 10*3/uL (ref 0.0–0.1)
Basophils Relative: 0.7 % (ref 0.0–3.0)
Eosinophils Absolute: 1.2 10*3/uL — ABNORMAL HIGH (ref 0.0–0.7)
Eosinophils Relative: 11.7 % — ABNORMAL HIGH (ref 0.0–5.0)
HCT: 39.8 % (ref 39.0–52.0)
Hemoglobin: 13.2 g/dL (ref 13.0–17.0)
Lymphocytes Relative: 24 % (ref 12.0–46.0)
Lymphs Abs: 2.4 10*3/uL (ref 0.7–4.0)
MCHC: 33.1 g/dL (ref 30.0–36.0)
MCV: 70.5 fl — ABNORMAL LOW (ref 78.0–100.0)
Monocytes Absolute: 0.6 10*3/uL (ref 0.1–1.0)
Monocytes Relative: 6.1 % (ref 3.0–12.0)
Neutro Abs: 5.8 10*3/uL (ref 1.4–7.7)
Neutrophils Relative %: 57.5 % (ref 43.0–77.0)
Platelets: 357 10*3/uL (ref 150.0–400.0)
RBC: 5.65 Mil/uL (ref 4.22–5.81)
RDW: 15.6 % — ABNORMAL HIGH (ref 11.5–15.5)
WBC: 10.1 10*3/uL (ref 4.0–10.5)

## 2023-01-25 LAB — IBC + FERRITIN
Ferritin: 81.7 ng/mL (ref 22.0–322.0)
Iron: 110 ug/dL (ref 42–165)
Saturation Ratios: 35.6 % (ref 20.0–50.0)
TIBC: 309.4 ug/dL (ref 250.0–450.0)
Transferrin: 221 mg/dL (ref 212.0–360.0)

## 2023-01-25 LAB — BASIC METABOLIC PANEL
BUN: 13 mg/dL (ref 6–23)
CO2: 27 mEq/L (ref 19–32)
Calcium: 9.3 mg/dL (ref 8.4–10.5)
Chloride: 101 mEq/L (ref 96–112)
Creatinine, Ser: 1.12 mg/dL (ref 0.40–1.50)
GFR: 70.83 mL/min (ref >60.00)
Glucose, Bld: 89 mg/dL (ref 70–99)
Potassium: 4.1 mEq/L (ref 3.5–5.1)
Sodium: 135 mEq/L (ref 135–145)

## 2023-01-25 MED ORDER — NA SULFATE-K SULFATE-MG SULF 17.5-3.13-1.6 GM/177ML PO SOLN
1.0000 | ORAL | 0 refills | Status: DC
  Filled 2023-01-25: qty 354, 1d supply, fill #0

## 2023-01-25 MED ORDER — SUCRALFATE 1 G PO TABS
1.0000 g | ORAL_TABLET | Freq: Four times a day (QID) | ORAL | 2 refills | Status: DC | PRN
  Filled 2023-01-25: qty 60, 15d supply, fill #0
  Filled 2023-02-23: qty 60, 15d supply, fill #1
  Filled 2023-03-25: qty 60, 15d supply, fill #2

## 2023-01-25 NOTE — Progress Notes (Signed)
HPI :   62 year old male who speaks [place] -translator present here today, here for a visit for dark stool, as well as some abdominal discomfort and history of colon polyps.  Recall I last saw him in November 2019.  At that point he had a chronic microcytic anemia, he had significant microcytosis with normal iron studies, found to have alpha thalassemia.  He had been having some constipation at the time, also had had some reflux.  He underwent an endoscopy and colonoscopy in March 2019 which did not show any concerning findings.  He had an 8 mm sessile serrated polyp removed and told to repeat in 5 years.  He states he has had 1 weeks worth of stools he states were dark black.  He states stools were sometimes hard, sometimes loose, denies any tarry or sticky stools.  On average was having 1 bowel movement per day.  States that this seems to have improved recently.  He has been on Protonix 40 mg daily for a long time, denies any NSAID use at all.  Denies any iron supplementation, no Pepto-Bismol, no changes in his diet.  More recently has been on Protonix 40 mg twice daily as well as Pepcid.  He has had some epigastric discomfort with regurgitation and discomfort, that can be intermittent.  No vomiting.  This is occurring despite his Protonix which has previously worked for him.  He has occasional constipation, using MiraLAX as needed which helps.  No bright red blood in the stool.   Vital stable in the clinic today.   Prior workup: EGD 02/11/2018 - small esophageal diverticulum, gastritis / gastric polyp, biopsies negative for H p Colonoscopy 02/11/2018 - normal ileum, 62m polyp ascending colon - sessile serrated, diverticulosis, hemorrhoids - recall 2024   1. Surgical [P], gastric polyp BXs, polyp - HYPERPLASTIC POLYP(S). - THERE IS NO EVIDENCE OF MALIGNANCY. 2. Surgical [P], gastric antrum and body - CHRONIC INACTIVE GASTRITIS. - THERE IS NO EVIDENCE OF HELICOBACTER PYLORI, DYSPLASIA, OR  MALIGNANCY. - SEE COMMENT. 3. Surgical [P], ascending colon, polyp - SESSILE SERRATED POLYP WITHOUT CYTOLOGIC DYSPLASIA.  Echo 02/28/21: EF 60-65% Grade I DD   CT abdomen pelvis 08/15/21 IMPRESSION: BILATERAL renal cysts with upper pole scarring LEFT kidney.   Fatty infiltration of liver.   LEFT inguinal hernia containing fat.   No acute intra-abdominal or intrapelvic abnormalities.     Past Medical History:  Diagnosis Date   Anemia    [place]    Dental infection 08/02/2018   GERD (gastroesophageal reflux disease)    Hypertension    Shingles rash 07/07/2019   Thal[place]semia, alpha ([place]    Viral gastritis 02/08/2019     Past Surgical History:  Procedure Laterality Date   ANTERIOR CERVICAL DECOMP/DISCECTOMY FUSION N/A 12/19/2021   Procedure: Anterior Cervical Discectomy and Fusion Cervical Four-Five-Cervical Five-Six;  Surgeon: CKary Kos MD;  [facility]  Service: Neurosurgery;  Laterality: N/A;  3C   NO PAST SURGERIES     Family History  Problem Relation Age of Onset   Colon cancer Neg Hx    Stomach cancer Neg Hx    Esophageal cancer Neg Hx    Social History   Tobacco Use   Smoking status: Former    Packs/day: 1.00    Years: 5.00    Total pack years: 5.00    Types: Cigarettes    Quit date: 12/15/1999    Years since quitting: 23.1   Smokeless tobacco: Never  Vaping  Use   Vaping Use: Never used  Substance Use Topics   Alcohol use: No    Comment: former   Drug use: No   Current Outpatient Medications  Medication Sig Dispense Refill   acetaminophen (TYLENOL) 500 MG tablet Take 1,000 mg by mouth every 6 (six) hours as needed for mild pain.     albuterol (VENTOLIN HFA) 108 (90 Base) MCG/ACT inhaler Inhale 2 puffs into the lungs every 6 (six) hours as needed for wheezing or shortness of breath. 18 g 2   amLODipine (NORVASC) 5 MG tablet Take 1 tablet (5 mg total) by mouth daily. 30 tablet 11   atorvastatin (LIPITOR) 80 MG tablet  Take 1 tablet (80 mg total) by mouth daily. 30 tablet 11   DULoxetine (CYMBALTA) 60 MG capsule Take 1 capsule (60 mg total) by mouth daily. 30 capsule 11   famotidine (PEPCID) 20 MG tablet Take 1 tablet (20 mg total) by mouth daily. 30 tablet 2   fluticasone-salmeterol (ADVAIR DISKUS) 500-50 MCG/ACT AEPB Inhale 1 puff into the lungs in the morning and at bedtime. 60 each 2   gabapentin (NEURONTIN) 300 MG capsule Take 3 capsules (900 mg total) by mouth 3 (three) times daily. 270 capsule 11   Na Sulfate-K Sulfate-Mg Sulf 17.5-3.13-1.6 GM/177ML SOLN Take as directed by your provider 354 mL 0   pantoprazole (PROTONIX) 40 MG tablet Take 1 tablet (40 mg total) by mouth 2 (two) times daily. 60 tablet 2   polyethylene glycol (MIRALAX) 17 g packet Take 17 g (1 capful) by mouth daily. (Patient taking differently: Take 17 g by mouth daily as needed for mild constipation.) 28 packet 0   sucralfate (CARAFATE) 1 g tablet Take 1 tablet (1 g total) by mouth every 6 (six) hours as needed. 60 tablet 2   valsartan (DIOVAN) 320 MG tablet Take 1 tablet (320 mg total) by mouth daily. 30 tablet 11   No current facility-administered medications for this visit.   No Known Allergies   Review of Systems: All systems reviewed and negative except where noted in HPI.    Physical Exam: BP 128/76   Pulse 92   Ht 5' 2"$  (1.575 m)   Wt 153 lb 12.8 oz (69.8 kg)   SpO2 97%   BMI 28.13 kg/m  Constitutional: Pleasant,well-developed, male in no acute distress. HEENT: Normocephalic and atraumatic. Conjunctivae are normal. No scleral icterus. Neck supple.  Cardiovascular: Normal rate, regular rhythm.  Pulmonary/chest: Effort normal and breath sounds normal. No wheezing, rales or rhonchi. Abdominal: Soft, nondistended, nontender. There are no masses palpable. No hepatomegaly. DRE - dark brown /black stool in vault that is heme negative, no mass lesions Extremities: no edema Lymphadenopathy: No cervical adenopathy  noted. Neurological: Alert and oriented to person place and time. Skin: Skin is warm and dry. No rashes noted. Psychiatric: Normal mood and affect. Behavior is normal.   ASSESSMENT: 62 y.o. male here for assessment of the following  1. Dark stools   2. Dyspepsia   3. History of colon polyps    As above, patient with several days of dark stools.  He is also had some ongoing GERD and dyspepsia that bother him despite higher doses of PPI.  On DRE today he is actually just has dark brown stool, I do not see any obvious melena.  The stool is heme-negative from DRE.  I will check CBC, iron studies, and BMET today but given his history and exam today I think it is less likely he  has had true GI bleeding.  He maintains compliance with PPI and avoid all NSAIDs.  Will check his labs today to make sure okay, given his ongoing dyspepsia and reflux despite PPI, think EGD is reasonable to pursue as he is also due for colonoscopy for his history of colon polyps.  I discussed risk benefits of endoscopy and anesthesia with him, he wants to proceed with both of these.  In the interim we will add some Carafate tablets to use as needed to see if that helps his stomach in the interim.  If he has any anemia or concerning lab findings concerning for bleeding obviously we will do this sooner may need admission.  If these labs are stable we will plan on doing this outpatient.  If he has any worsening or concerning symptoms in the interim he will let me know.  PLAN: - CBC and TIBC / ferritin, and BMET to make sure stable - continue protonix and pepcid - add carafate tablet - 1 tab every 6 hours PRN - avoid NSAIDs - schedule EGD and colonoscopy at the [place], MD [facility]  CC: Gaylan Gerold, DO

## 2023-01-25 NOTE — Patient Instructions (Addendum)
If you are age 62 or older, your body mass index should be between 23-30. Your Body mass index is 28.13 kg/m. If this is out of the aforementioned range listed, please consider follow up with your Primary Care Provider.  If you are age 18 or younger, your body mass index should be between 19-25. Your Body mass index is 28.13 kg/m. If this is out of the aformentioned range listed, please consider follow up with your Primary Care Provider.   ________________________________________________________  Eric Ingram have been scheduled for an endoscopy and colonoscopy. Please follow the written instructions given to you at your visit today. Please pick up your prep supplies at the pharmacy within the next 1-3 days. If you use inhalers (even only as needed), please bring them with you on the day of your procedure.    Please go to the lab in the basement of our building to have lab work done as you leave today. Hit "B" for basement when you get on the elevator.  When the doors open the lab is on your left.  We will call you with the results. Thank you.  We have sent the following medications to your pharmacy for you to pick up at your convenience: Suprep Carafate tablets: Take one every 6 hours as needed  DO NOT take any NSAIDs - just as Ibuprofen or Advil or Aleve.  Thank you for entrusting me with your care and for choosing [facility], Dr. [place] Cellar

## 2023-02-19 ENCOUNTER — Encounter: Payer: Self-pay | Admitting: Gastroenterology

## 2023-02-23 ENCOUNTER — Other Ambulatory Visit (HOSPITAL_COMMUNITY): Payer: Self-pay

## 2023-03-01 ENCOUNTER — Encounter: Payer: Self-pay | Admitting: Gastroenterology

## 2023-03-01 ENCOUNTER — Ambulatory Visit (AMBULATORY_SURGERY_CENTER): Payer: Medicare Other | Admitting: Gastroenterology

## 2023-03-01 VITALS — BP 132/86 | HR 76 | Temp 98.4°F | Resp 14 | Ht 62.0 in | Wt 153.0 lb

## 2023-03-01 DIAGNOSIS — Z09 Encounter for follow-up examination after completed treatment for conditions other than malignant neoplasm: Secondary | ICD-10-CM

## 2023-03-01 DIAGNOSIS — D120 Benign neoplasm of cecum: Secondary | ICD-10-CM

## 2023-03-01 DIAGNOSIS — D123 Benign neoplasm of transverse colon: Secondary | ICD-10-CM | POA: Diagnosis not present

## 2023-03-01 DIAGNOSIS — Z86010 Personal history of colonic polyps: Secondary | ICD-10-CM

## 2023-03-01 DIAGNOSIS — K635 Polyp of colon: Secondary | ICD-10-CM | POA: Diagnosis not present

## 2023-03-01 DIAGNOSIS — D125 Benign neoplasm of sigmoid colon: Secondary | ICD-10-CM

## 2023-03-01 DIAGNOSIS — R195 Other fecal abnormalities: Secondary | ICD-10-CM

## 2023-03-01 DIAGNOSIS — R1013 Epigastric pain: Secondary | ICD-10-CM | POA: Diagnosis not present

## 2023-03-01 DIAGNOSIS — K2990 Gastroduodenitis, unspecified, without bleeding: Secondary | ICD-10-CM

## 2023-03-01 DIAGNOSIS — K2970 Gastritis, unspecified, without bleeding: Secondary | ICD-10-CM

## 2023-03-01 DIAGNOSIS — D124 Benign neoplasm of descending colon: Secondary | ICD-10-CM

## 2023-03-01 DIAGNOSIS — K3189 Other diseases of stomach and duodenum: Secondary | ICD-10-CM | POA: Diagnosis not present

## 2023-03-01 HISTORY — PX: COLONOSCOPY WITH ESOPHAGOGASTRODUODENOSCOPY (EGD): SHX5779

## 2023-03-01 MED ORDER — SODIUM CHLORIDE 0.9 % IV SOLN: 500.0000 mL | Freq: Once | INTRAVENOUS | Status: DC

## 2023-03-01 NOTE — Op Note (Signed)
[facility] Patient Name: Eric Ingram Procedure Date: 03/01/2023 3:05 PM MRN: [id number] Endoscopist: Remo Lipps P. Havery Moros , MD, [id number] Age: 62 Referring MD:  Date of Birth: December 05, 1961 Gender: Male Account #: [id number] Procedure:                Colonoscopy Indications:              High risk colon cancer surveillance: Personal                            history of colonic polyps - 108mm sessile serrated                            polyp removed 02/2018 Medicines:                Monitored Anesthesia Care Procedure:                Pre-Anesthesia Assessment:                           - Prior to the procedure, a History and Physical                            was performed, and patient medications and                            allergies were reviewed. The patient's tolerance of                            previous anesthesia was also reviewed. The risks                            and benefits of the procedure and the sedation                            options and risks were discussed with the patient.                            All questions were answered, and informed consent                            was obtained. Prior Anticoagulants: The patient has                            taken no anticoagulant or antiplatelet agents. ASA                            Grade Assessment: III - A patient with severe                            systemic disease. After reviewing the risks and                            benefits, the patient was deemed in satisfactory  condition to undergo the procedure.                           After obtaining informed consent, the colonoscope                            was passed under direct vision. Throughout the                            procedure, the patient's blood pressure, pulse, and                            oxygen saturations were monitored continuously. The                            CF [id number] was introduced  through the anus                            and advanced to the the cecum, identified by                            appendiceal orifice and ileocecal valve. The                            colonoscopy was performed without difficulty. The                            patient tolerated the procedure well. The quality                            of the bowel preparation was good. The ileocecal                            valve, appendiceal orifice, and rectum were                            photographed. Scope In: 3:18:52 PM Scope Out: 3:34:08 PM Scope Withdrawal Time: 0 hours 12 minutes 36 seconds  Total Procedure Duration: 0 hours 15 minutes 16 seconds  Findings:                 The perianal and digital rectal examinations were                            normal.                           A 3 mm polyp was found in the cecum. The polyp was                            flat. The polyp was removed with a cold snare.                            Resection and retrieval were complete.  A 3 mm polyp was found in the transverse colon. The                            polyp was sessile. The polyp was removed with a                            cold snare. Resection and retrieval were complete.                           A 6 mm polyp was found in the sigmoid colon. The                            polyp was flat. The polyp was removed with a cold                            snare. Resection and retrieval were complete.                           A few small-mouthed diverticula were found in the                            transverse colon.                           Internal hemorrhoids were found. The hemorrhoids                            were small.                           The exam was otherwise without abnormality. Of                            note, retroflexed views of the rectum not obtained                            given small size of the rectum. Complications:            No  immediate complications. Estimated blood loss:                            Minimal. Estimated Blood Loss:     Estimated blood loss was minimal. Impression:               - One 3 mm polyp in the cecum, removed with a cold                            snare. Resected and retrieved.                           - One 3 mm polyp in the transverse colon, removed                            with a cold snare. Resected and retrieved.                           -  One 6 mm polyp in the sigmoid colon, removed with                            a cold snare. Resected and retrieved.                           - Diverticulosis in the transverse colon.                           - Internal hemorrhoids.                           - The examination was otherwise normal.                           - The GI Genius (intelligent endoscopy module),                            computer-aided polyp detection system powered by AI                            was utilized to detect colorectal polyps through                            enhanced visualization during colonoscopy. Recommendation:           - Patient has a contact number available for                            emergencies. The signs and symptoms of potential                            delayed complications were discussed with the                            patient. Return to normal activities tomorrow.                            Written discharge instructions were provided to the                            patient.                           - Resume previous diet.                           - Continue present medications.                           - Await pathology results. Remo Lipps P. Nira Visscher, MD 03/01/2023 3:41:18 PM This report has been signed electronically.

## 2023-03-01 NOTE — Progress Notes (Signed)
Called to room to assist during endoscopic procedure.  Patient ID and intended procedure confirmed with present staff. Received instructions for my participation in the procedure from the performing physician.  

## 2023-03-01 NOTE — Patient Instructions (Addendum)
Recommendation:- Patient has a contact number available for                            emergencies. The signs and symptoms of potential                            delayed complications were discussed with the                            patient. Return to normal activities tomorrow.                            Written discharge instructions were provided to the                            patient.                           - Resume previous diet.                           - Continue present medications.                           - Continue protonix twice daily for now                           - Avoid NSAIDs                           - Await pathology results. Recommendation: - Patient has a contact number available for                            emergencies. The signs and symptoms of potential                            delayed complications were discussed with the                            patient. Return to normal activities tomorrow.                            Written discharge instructions were provided to the                            patient.                           - Resume previous diet.                           - Continue present medications.                           - Continue protonix twice daily for now                           -  Avoid NSAIDs                           - Await pathology results.  Handouts on polyps, diverticulosis ad hemorrhoids given.  YOU HAD AN ENDOSCOPIC PROCEDURE TODAY AT [facility]:   Refer to the procedure report that was given to you for any specific questions about what was found during the examination.  If the procedure report does not answer your questions, please call your gastroenterologist to clarify.  If you requested that your care partner not be given the details of your procedure findings, then the procedure report has been included in a sealed envelope for you to review at your convenience later.  YOU SHOULD EXPECT: Some  feelings of bloating in the abdomen. Passage of more gas than usual.  Walking can help get rid of the air that was put into your GI tract during the procedure and reduce the bloating. If you had a lower endoscopy (such as a colonoscopy or flexible sigmoidoscopy) you may notice spotting of blood in your stool or on the toilet paper. If you underwent a bowel prep for your procedure, you may not have a normal bowel movement for a few days.  Please Note:  You might notice some irritation and congestion in your nose or some drainage.  This is from the oxygen used during your procedure.  There is no need for concern and it should clear up in a day or so.  SYMPTOMS TO REPORT IMMEDIATELY:  Following lower endoscopy (colonoscopy or flexible sigmoidoscopy):  Excessive amounts of blood in the stool  Significant tenderness or worsening of abdominal pains  Swelling of the abdomen that is new, acute  Fever of 100F or higher  Following upper endoscopy (EGD)  Vomiting of blood or coffee ground material  New chest pain or pain under the shoulder blades  Painful or persistently difficult swallowing  New shortness of breath  Fever of 100F or higher  Black, tarry-looking stools  For urgent or emergent issues, a gastroenterologist can be reached at any hour by calling [phone]. Do not use MyChart messaging for urgent concerns.    DIET:  We do recommend a small meal at first, but then you may proceed to your regular diet.  Drink plenty of fluids but you should avoid alcoholic beverages for 24 hours.  ACTIVITY:  You should plan to take it easy for the rest of today and you should NOT DRIVE or use heavy machinery until tomorrow (because of the sedation medicines used during the test).    FOLLOW UP: Our staff will call the number listed on your records the next business day following your procedure.  We will call around 7:15- 8:00 am to check on you and address any questions or concerns that you may have  regarding the information given to you following your procedure. If we do not reach you, we will leave a message.     If any biopsies were taken you will be contacted by phone or by letter within the next 1-3 weeks.  Please call us at [phone] if you have not heard about the biopsies in 3 weeks.    SIGNATURES/CONFIDENTIALITY: You and/or your care partner have signed paperwork which will be entered into your electronic medical record.  These signatures attest to the fact that that the information above on your After Visit Summary has been reviewed and is understood.  Full responsibility of the  confidentiality of this discharge information lies with you and/or your care-partner. 

## 2023-03-01 NOTE — Progress Notes (Signed)
Pt resting comfortably. VSS. Airway intact. SBAR complete to RN. All questions answered.   

## 2023-03-01 NOTE — Progress Notes (Signed)
Updated medical record.

## 2023-03-01 NOTE — Progress Notes (Signed)
[facility] History and Physical   Primary Care Physician:  Gaylan Gerold, DO   Reason for Procedure:   Dark stool, dyspepsia, history of colon polyps  Plan:    EGD and colonoscopy     HPI: Eric Ingram is a 61 y.o. male  here for EGD and colonoscopy to evaluate issues as outlined above. Labs showed stable Hgb with normal iron studies at time of office visit. Now on protonix BID and carafate PRN, stomach has felt better lately. No further dark stools. Had an 60mm SSP removed on last colonoscopy 02/2018.   Patient denies any bowel symptoms at this time. No family history of colon cancer known. Otherwise feels well without any cardiopulmonary symptoms.   I have discussed risks / benefits of anesthesia and endoscopic procedure with Ryle Nesler and they wish to proceed with the exams as outlined today.    Past Medical History:  Diagnosis Date   Anemia    CHF (congestive heart failure) ([place])    Dental infection 08/02/2018   GERD (gastroesophageal reflux disease)    Hypertension    Shingles rash 07/07/2019   Thalassemia, alpha ([place])    Viral gastritis 02/08/2019    Past Surgical History:  Procedure Laterality Date   ANTERIOR CERVICAL DECOMP/DISCECTOMY FUSION N/A 12/19/2021   Procedure: Anterior Cervical Discectomy and Fusion Cervical Four-Five-Cervical Five-Six;  Surgeon: Kary Kos, MD;  Location: [facility];  Service: Neurosurgery;  Laterality: N/A;  3C   COLONOSCOPY  2019   COLONOSCOPY WITH ESOPHAGOGASTRODUODENOSCOPY (EGD)  03/01/2023    Prior to Admission medications   Medication Sig Start Date End Date Taking? Authorizing Provider  albuterol (VENTOLIN HFA) 108 (90 Base) MCG/ACT inhaler Inhale 2 puffs into the lungs every 6 (six) hours as needed for wheezing or shortness of breath. 07/07/22  Yes Gaylan Gerold, DO  amLODipine (NORVASC) 5 MG tablet Take 1 tablet (5 mg total) by mouth daily. 12/17/22 12/17/23 Yes Gaylan Gerold, DO  atorvastatin (LIPITOR) 80 MG tablet Take 1 tablet  (80 mg total) by mouth daily. 10/05/22  Yes Virl Axe, MD  famotidine (PEPCID) 20 MG tablet Take 1 tablet (20 mg total) by mouth daily. 08/05/22  Yes Gaylan Gerold, DO  fluticasone-salmeterol (ADVAIR DISKUS) 500-50 MCG/ACT AEPB Inhale 1 puff into the lungs in the morning and at bedtime. 07/07/22 03/01/23 Yes Gaylan Gerold, DO  gabapentin (NEURONTIN) 300 MG capsule Take 3 capsules (900 mg total) by mouth 3 (three) times daily. 12/10/22  Yes Gaylan Gerold, DO  sucralfate (CARAFATE) 1 g tablet Take 1 tablet (1 g total) by mouth every 6 (six) hours as needed. 01/25/23  Yes Beva Remund, Carlota Raspberry, MD  valsartan (DIOVAN) 320 MG tablet Take 1 tablet (320 mg total) by mouth daily. 10/05/22  Yes Virl Axe, MD  acetaminophen (TYLENOL) 500 MG tablet Take 1,000 mg by mouth every 6 (six) hours as needed for mild pain.    [provider]  DULoxetine (CYMBALTA) 60 MG capsule Take 1 capsule (60 mg total) by mouth daily. 10/05/22   Virl Axe, MD  pantoprazole (PROTONIX) 40 MG tablet Take 1 tablet (40 mg total) by mouth 2 (two) times daily. 10/05/22   Virl Axe, MD  polyethylene glycol (MIRALAX) 17 g packet Take 17 g (1 capful) by mouth daily. Patient taking differently: Take 17 g by mouth daily as needed for mild constipation. 11/19/21   Gaylan Gerold, DO    Current Outpatient Medications  Medication Sig Dispense Refill   albuterol (VENTOLIN HFA) 108 (90 Base) MCG/ACT inhaler  Inhale 2 puffs into the lungs every 6 (six) hours as needed for wheezing or shortness of breath. 18 g 2   amLODipine (NORVASC) 5 MG tablet Take 1 tablet (5 mg total) by mouth daily. 30 tablet 11   atorvastatin (LIPITOR) 80 MG tablet Take 1 tablet (80 mg total) by mouth daily. 30 tablet 11   famotidine (PEPCID) 20 MG tablet Take 1 tablet (20 mg total) by mouth daily. 30 tablet 2   fluticasone-salmeterol (ADVAIR DISKUS) 500-50 MCG/ACT AEPB Inhale 1 puff into the lungs in the morning and at bedtime. 60 each 2   gabapentin  (NEURONTIN) 300 MG capsule Take 3 capsules (900 mg total) by mouth 3 (three) times daily. 270 capsule 11   sucralfate (CARAFATE) 1 g tablet Take 1 tablet (1 g total) by mouth every 6 (six) hours as needed. 60 tablet 2   valsartan (DIOVAN) 320 MG tablet Take 1 tablet (320 mg total) by mouth daily. 30 tablet 11   acetaminophen (TYLENOL) 500 MG tablet Take 1,000 mg by mouth every 6 (six) hours as needed for mild pain.     DULoxetine (CYMBALTA) 60 MG capsule Take 1 capsule (60 mg total) by mouth daily. 30 capsule 11   pantoprazole (PROTONIX) 40 MG tablet Take 1 tablet (40 mg total) by mouth 2 (two) times daily. 60 tablet 2   polyethylene glycol (MIRALAX) 17 g packet Take 17 g (1 capful) by mouth daily. (Patient taking differently: Take 17 g by mouth daily as needed for mild constipation.) 28 packet 0   Current Facility-Administered Medications  Medication Dose Route Frequency Provider Last Rate Last Admin   0.9 %  sodium chloride infusion  500 mL Intravenous Once Alverda Nazzaro, Carlota Raspberry, MD        Allergies as of 03/01/2023   (No Known Allergies)    Family History  Problem Relation Age of Onset   Colon cancer Neg Hx    Stomach cancer Neg Hx    Esophageal cancer Neg Hx     Social History   Socioeconomic History   Marital status: Married    Spouse name: Not on file   Number of children: 4   Years of education: Not on file   Highest education level: Not on file  Occupational History   Occupation: Unemployed  Tobacco Use   Smoking status: Former    Packs/day: 1.00    Years: 5.00    Additional pack years: 0.00    Total pack years: 5.00    Types: Cigarettes    Quit date: 12/15/1999    Years since quitting: 23.2   Smokeless tobacco: Never  Vaping Use   Vaping Use: Never used  Substance and Sexual Activity   Alcohol use: Not Currently    Comment: former   Drug use: Never   Sexual activity: Not on file  Other Topics Concern   Not on file  Social History Narrative   Not on file    Social Determinants of Health   Financial Resource Strain: Not on file  Food Insecurity: Not on file  Transportation Needs: Not on file  Physical Activity: Not on file  Stress: Not on file  Social Connections: Not on file  Intimate Partner Violence: Not on file    Review of Systems: All other review of systems negative except as mentioned in the HPI.  Physical Exam: Vital signs BP 134/78   Pulse 88   Temp 98.4 F (36.9 C) (Temporal)   Ht 5\' 2"  (1.575 m)  Wt 153 lb (69.4 kg)   SpO2 97%   BMI 27.98 kg/m   General:   Alert,  Well-developed, pleasant and cooperative in NAD Lungs:  Clear throughout to auscultation.   Heart:  Regular rate and rhythm Abdomen:  Soft, nontender and nondistended.   Neuro/Psych:  Alert and cooperative. Normal mood and affect. A and O x 3  Jolly Mango, MD [facility]

## 2023-03-01 NOTE — Op Note (Signed)
[facility] Patient Name: Eric Ingram Procedure Date: 03/01/2023 3:05 PM MRN: [id number] Endoscopist: Remo Lipps P. Havery Moros , MD, [id number] Age: 62 Referring MD:  Date of Birth: Jul 12, 1961 Gender: Male Account #: [id number] Procedure:                Upper GI endoscopy Indications:              Dark stools, Dyspepsia - no further dark stools                            since office visit, on protonix twice daily now,                            dyspepsia improved. CBC and iron studies okay Medicines:                Monitored Anesthesia Care Procedure:                Pre-Anesthesia Assessment:                           - Prior to the procedure, a History and Physical                            was performed, and patient medications and                            allergies were reviewed. The patient's tolerance of                            previous anesthesia was also reviewed. The risks                            and benefits of the procedure and the sedation                            options and risks were discussed with the patient.                            All questions were answered, and informed consent                            was obtained. Prior Anticoagulants: The patient has                            taken no anticoagulant or antiplatelet agents. ASA                            Grade Assessment: III - A patient with severe                            systemic disease. After reviewing the risks and                            benefits, the patient was deemed in satisfactory  condition to undergo the procedure.                           After obtaining informed consent, the endoscope was                            passed under direct vision. Throughout the                            procedure, the patient's blood pressure, pulse, and                            oxygen saturations were monitored continuously. The                             Olympus Scope [id number] was introduced through the                            mouth, and advanced to the second part of duodenum.                            The upper GI endoscopy was accomplished without                            difficulty. The patient tolerated the procedure                            well. Scope In: Scope Out: Findings:                 Esophagogastric landmarks were identified: the                            Z-line was found at 34 cm, the gastroesophageal                            junction was found at 34 cm and the site of hiatal                            narrowing was found at 35 cm from the incisors.                           A 1 cm hiatal hernia was present.                           A diverticulum with a small opening was found in                            the upper third of the esophagus.                           The exam of the esophagus was otherwise normal.  Patchy inflammation characterized by erythema and                            granularity was found in the gastric antrum.                           The exam of the stomach was otherwise normal.                           Biopsies were taken with a cold forceps in the                            gastric body, at the incisura and in the gastric                            antrum for Helicobacter pylori testing.                           The examined duodenum was normal. Complications:            No immediate complications. Estimated blood loss:                            Minimal. Estimated Blood Loss:     Estimated blood loss was minimal. Impression:               - Esophagogastric landmarks identified.                           - 1 cm hiatal hernia.                           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                           - Normal esophagus otherwise                           - Gastritis. Could have been cause of dyspepsia and                             dark stools previously                           - Normal stomach otherwise - biopsies taken to rule                            out H pylori                           - Normal examined duodenum. Recommendation:           - Patient has a contact number available for                            emergencies. The signs and symptoms of potential  delayed complications were discussed with the                            patient. Return to normal activities tomorrow.                            Written discharge instructions were provided to the                            patient.                           - Resume previous diet.                           - Continue present medications.                           - Continue protonix twice daily for now                           - Avoid NSAIDs                           - Await pathology results. Remo Lipps P. Alfonsa Vaile, MD 03/01/2023 3:45:52 PM This report has been signed electronically.

## 2023-03-02 ENCOUNTER — Telehealth: Payer: Self-pay

## 2023-03-02 NOTE — Telephone Encounter (Signed)
  Follow up Call-     03/01/2023    2:09 PM  Call back number  Post procedure Call Back phone  # dtr Hmin [phone]  Permission to leave phone message Yes     Patient questions:  Do you have a fever, pain , or abdominal swelling? No. Pain Score  0 *  Have you tolerated food without any problems? Yes.    Have you been able to return to your normal activities? Yes.    Do you have any questions about your discharge instructions: Diet   No. Medications  No. Follow up visit  No.  Do you have questions or concerns about your Care? No.  Actions: * If pain score is 4 or above: No action needed, pain <4.

## 2023-03-06 ENCOUNTER — Encounter: Payer: Self-pay | Admitting: Gastroenterology

## 2023-03-08 ENCOUNTER — Other Ambulatory Visit: Payer: Self-pay | Admitting: Student

## 2023-03-08 ENCOUNTER — Other Ambulatory Visit (HOSPITAL_COMMUNITY): Payer: Self-pay

## 2023-03-08 DIAGNOSIS — K219 Gastro-esophageal reflux disease without esophagitis: Secondary | ICD-10-CM

## 2023-03-08 MED ORDER — PANTOPRAZOLE SODIUM 40 MG PO TBEC
40.0000 mg | DELAYED_RELEASE_TABLET | Freq: Two times a day (BID) | ORAL | 2 refills | Status: DC
  Filled 2023-03-08: qty 60, 30d supply, fill #0
  Filled 2023-03-25: qty 60, 30d supply, fill #1

## 2023-03-09 ENCOUNTER — Other Ambulatory Visit (HOSPITAL_COMMUNITY): Payer: Self-pay

## 2023-03-25 ENCOUNTER — Other Ambulatory Visit (HOSPITAL_COMMUNITY): Payer: Self-pay

## 2023-03-31 ENCOUNTER — Other Ambulatory Visit (HOSPITAL_COMMUNITY): Payer: Self-pay

## 2023-04-13 ENCOUNTER — Other Ambulatory Visit (HOSPITAL_COMMUNITY): Payer: Self-pay

## 2023-04-22 ENCOUNTER — Emergency Department (HOSPITAL_COMMUNITY): Admission: EM | Admit: 2023-04-22 | Payer: Medicare Other | Source: Home / Self Care

## 2023-04-22 ENCOUNTER — Other Ambulatory Visit (HOSPITAL_COMMUNITY): Payer: Self-pay

## 2023-04-22 ENCOUNTER — Ambulatory Visit (INDEPENDENT_AMBULATORY_CARE_PROVIDER_SITE_OTHER): Payer: Medicare Other

## 2023-04-22 VITALS — BP 137/77 | HR 91 | Temp 99.0°F | Wt 152.8 lb

## 2023-04-22 DIAGNOSIS — I10 Essential (primary) hypertension: Secondary | ICD-10-CM | POA: Diagnosis not present

## 2023-04-22 DIAGNOSIS — B370 Candidal stomatitis: Secondary | ICD-10-CM | POA: Insufficient documentation

## 2023-04-22 DIAGNOSIS — R051 Acute cough: Secondary | ICD-10-CM | POA: Diagnosis not present

## 2023-04-22 HISTORY — DX: Candidal stomatitis: B37.0

## 2023-04-22 MED ORDER — BENZONATATE 100 MG PO CAPS
100.0000 mg | ORAL_CAPSULE | Freq: Three times a day (TID) | ORAL | 0 refills | Status: DC | PRN
  Filled 2023-04-22: qty 20, 7d supply, fill #0

## 2023-04-22 MED ORDER — AZITHROMYCIN 250 MG PO TABS
ORAL_TABLET | ORAL | 0 refills | Status: AC
  Filled 2023-04-22: qty 6, 5d supply, fill #0

## 2023-04-22 MED ORDER — DM-GUAIFENESIN ER 30-600 MG PO TB12
1.0000 | ORAL_TABLET | Freq: Two times a day (BID) | ORAL | 0 refills | Status: DC
  Filled 2023-04-22: qty 30, 15d supply, fill #0

## 2023-04-22 MED ORDER — FLUCONAZOLE 150 MG PO TABS
150.0000 mg | ORAL_TABLET | Freq: Every day | ORAL | 0 refills | Status: DC
  Filled 2023-04-22: qty 7, 7d supply, fill #0

## 2023-04-22 NOTE — Assessment & Plan Note (Signed)
Current medications include amLODipine 5 MG and valsartan 320 MG daily. Patient states that they are compliant with this medication. Initial BP today is 137/77.    Plan: - Continue amLODipine 5 MG and valsartan 320 MG daily

## 2023-04-22 NOTE — ED Notes (Signed)
Unable to locate pt in lobby. Called x2

## 2023-04-22 NOTE — Patient Instructions (Addendum)
Thank you for coming to see [place] in clinic Eric Ingram.  Plan:  - Please start taking:    -Over the counter Gas-X to help with your abdominal bloating -Neti pot to help with nasal congestion  -Tessalon Perles and Mucinex DM for your cough  -Azithromycin (5 days) for your cough  -Fluconazole (7 days) for the fungal infection on your tongue  Return Precautions: If you develop new or worsening fever, chills, cough, difficulty breathing, please call our clinic and visit the emergency department as these can be signs of pneumonia or worsening of your asthma.    It was very nice to see you, thank you for allowing [place] to be involved in your care. We look forward to seeing you next time. Please call our clinic at [phone] if you have any questions or concerns. The best time to call is Monday-Friday from 9am-4pm, but there is someone available 24/7. If after hours or the weekend, call the main hospital number at [phone] and ask for the Internal Medicine Resident On-Call. If you need medication refills, please notify your pharmacy one week in advance and they will send [place] a request.   Please make sure to arrive 15 minutes prior to your next appointment. If you arrive late, you may be asked to reschedule.

## 2023-04-22 NOTE — Progress Notes (Signed)
CC: cough  HPI:  Eric Ingram is a 62 y.o. male with past medical history of HTN, HLD, asthma that presents for cough.    Allergies as of 04/22/2023   No Known Allergies      Medication List        Accurate as of Apr 22, 2023  4:45 PM. If you have any questions, ask your nurse or doctor.          acetaminophen 500 MG tablet Commonly known as: TYLENOL Take 1,000 mg by mouth every 6 (six) hours as needed for mild pain.   Advair Diskus 500-50 MCG/ACT Aepb Generic drug: fluticasone-salmeterol Inhale 1 puff into the lungs in the morning and at bedtime.   amLODipine 5 MG tablet Commonly known as: NORVASC Take 1 tablet (5 mg total) by mouth daily.   atorvastatin 80 MG tablet Commonly known as: Lipitor Take 1 tablet (80 mg total) by mouth daily.   azithromycin 250 MG tablet Commonly known as: Zithromax Z-Pak Take 2 tablets (500 mg) by mouth on Day 1, followed by 1 tablet (250 mg) once daily on Days 2 through 5. Started by: Karoline Caldwell, MD   benzonatate 100 MG capsule Commonly known as: Tessalon Perles Take 1 capsule (100 mg total) by mouth 3 (three) times daily as needed for cough. Started by: Karoline Caldwell, MD   dextromethorphan-guaiFENesin 30-600 MG 12hr tablet Commonly known as: MUCINEX DM Take 1 tablet by mouth 2 (two) times daily. Started by: Karoline Caldwell, MD   DULoxetine 60 MG capsule Commonly known as: CYMBALTA Take 1 capsule (60 mg total) by mouth daily.   famotidine 20 MG tablet Commonly known as: PEPCID Take 1 tablet (20 mg total) by mouth daily.   fluconazole 150 MG tablet Commonly known as: DIFLUCAN Take 1 tablet (150 mg total) by mouth daily. Started by: Karoline Caldwell, MD   gabapentin 300 MG capsule Commonly known as: NEURONTIN Take 3 capsules (900 mg total) by mouth 3 (three) times daily.   pantoprazole 40 MG tablet Commonly known as: PROTONIX Take 1 tablet (40 mg total) by mouth 2 (two) times daily.   polyethylene glycol 17 g  packet Commonly known as: MiraLax Take 17 g (1 capful) by mouth daily. What changed:  when to take this reasons to take this   sucralfate 1 g tablet Commonly known as: Carafate Take 1 tablet (1 g total) by mouth every 6 (six) hours as needed.   valsartan 320 MG tablet Commonly known as: DIOVAN Take 1 tablet (320 mg total) by mouth daily.   Ventolin HFA 108 (90 Base) MCG/ACT inhaler Generic drug: albuterol Inhale 2 puffs into the lungs every 6 (six) hours as needed for wheezing or shortness of breath.         Past Medical History:  Diagnosis Date   Anemia    CHF (congestive heart failure) (HCC)    Dental infection 08/02/2018   GERD (gastroesophageal reflux disease)    Hypertension    Shingles rash 07/07/2019   Thalassemia, alpha (HCC)    Viral gastritis 02/08/2019   Review of Systems:  per HPI.   Physical Exam: Vitals:   04/22/23 1408  BP: 137/77  Pulse: 91  Temp: 99 F (37.2 C)  TempSrc: Oral  SpO2: 100%  Weight: 152 lb 12.8 oz (69.3 kg)   Constitutional: Well-developed, well-nourished, intermittently coughing  HENT: Normocephalic and atraumatic. Mild tenderness to palpation of bilateral maxillary regions. Oral thrush. No significant oropharyngeal erythema or exudates.  Eyes: EOM  are normal. PERRL.  Neck: Normal range of motion.  Cardiovascular: Regular rate, regular rhythm. No murmurs, rubs, or gallops. Normal radial and PT pulses bilaterally. No LE edema.  Pulmonary: Normal respiratory effort. No wheezes or crackles.  Abdominal: Soft. Non-distended. Mild diffuse abdominal tenderness. Normal bowel sounds.  Musculoskeletal: Normal range of motion.     Neurological: Alert and oriented to person, place, and time. Non-focal. Skin: warm and dry.    Assessment & Plan:   See Encounters Tab for problem based charting.  Cough Patient presents w/ cough over the last 2 weeks. Cough is productive of yellow sputum. Patient reports subjective fever and chills  intermittently throughout this period of time but has not checked his temperature. Patient is also having nasal congestion, facial pain, generalized headache, sore throat, and lightheadedness. Patient reports abdominal bloating and decreased appetite due to his bloating. Patient denies n/v/d, constipation. They have tried OTC cough syrup and tylenol at home without relief. Patient reports that his wife has similar symptoms. Patient has a history of asthma. Current medications include albuterol inhaler and Advair (ICS, LABA). Patient reports that he has not had increased use of these inhalers at home. Good air movement without wheezing on exam. Suspect symptoms are likely secondary to viral URI. Given out of treatment window for covid/influenza, will not test today.  However, given his lack of improvement over the last 2 weeks and that he is at high risk given his asthma, will treat for acute bronchitis with short course of azithromycin.  Plan: - Continue albuterol inhaler and Advair - Tessalon perles and Mucinex DM - Azithromycin 500 mg for 1 day followed by 250 mg for 4 days (5 days total) - Neti pot - OTC Gas X  Oral thrush Patient has oral thrush on exam.  Likely secondary to albuterol inhaler use.  Patient does not have a spacer at home.  Discussed importance of rinsing his mouth out after inhaler use.  Will prescribe 7-day course of fluconazole.  Will reassess for symptom improvement in 2 weeks.  Plan: - Fluconazole 150 mg for 7 days  Hypertension Current medications include amLODipine 5 MG and valsartan 320 MG daily. Patient states that they are compliant with this medication. Initial BP today is 137/77.    Plan: - Continue amLODipine 5 MG and valsartan 320 MG daily    Patient seen with Dr. Antony Contras

## 2023-04-22 NOTE — Assessment & Plan Note (Signed)
Patient has oral thrush on exam.  Likely secondary to albuterol inhaler use.  Patient does not have a spacer at home.  Discussed importance of rinsing his mouth out after inhaler use.  Will prescribe 7-day course of fluconazole.  Will reassess for symptom improvement in 2 weeks.  Plan: - Fluconazole 150 mg for 7 days

## 2023-04-22 NOTE — Assessment & Plan Note (Signed)
Patient presents w/ cough over the last 2 weeks. Cough is productive of yellow sputum. Patient reports subjective fever and chills intermittently throughout this period of time but has not checked his temperature. Patient is also having nasal congestion, facial pain, generalized headache, sore throat, and lightheadedness. Patient reports abdominal bloating and decreased appetite due to his bloating. Patient denies n/v/d, constipation. They have tried OTC cough syrup and tylenol at home without relief. Patient reports that his wife has similar symptoms. Patient has a history of asthma. Current medications include albuterol inhaler and Advair (ICS, LABA). Patient reports that he has not had increased use of these inhalers at home. Good air movement without wheezing on exam. Suspect symptoms are likely secondary to viral URI. Given out of treatment window for covid/influenza, will not test today.  However, given his lack of improvement over the last 2 weeks and that he is at high risk given his asthma, will treat for acute bronchitis with short course of azithromycin.  Plan: - Continue albuterol inhaler and Advair - Tessalon perles and Mucinex DM - Azithromycin 500 mg for 1 day followed by 250 mg for 4 days (5 days total) - Neti pot - OTC Gas X

## 2023-04-23 NOTE — Progress Notes (Signed)
Internal Medicine Clinic Attending  Case discussed with Dr. Mapp  At the time of the visit.  We reviewed the resident's history and exam and pertinent patient test results.  I agree with the assessment, diagnosis, and plan of care documented in the resident's note.  

## 2023-05-03 ENCOUNTER — Other Ambulatory Visit (HOSPITAL_COMMUNITY): Payer: Self-pay

## 2023-05-06 ENCOUNTER — Ambulatory Visit (INDEPENDENT_AMBULATORY_CARE_PROVIDER_SITE_OTHER): Payer: Medicare Other | Admitting: Student

## 2023-05-06 ENCOUNTER — Other Ambulatory Visit (HOSPITAL_COMMUNITY): Payer: Self-pay

## 2023-05-06 ENCOUNTER — Encounter: Payer: Medicare Other | Admitting: Student

## 2023-05-06 ENCOUNTER — Encounter: Payer: Self-pay | Admitting: Student

## 2023-05-06 VITALS — BP 132/83 | HR 96 | Temp 98.2°F | Wt 153.6 lb

## 2023-05-06 DIAGNOSIS — I10 Essential (primary) hypertension: Secondary | ICD-10-CM

## 2023-05-06 DIAGNOSIS — B370 Candidal stomatitis: Secondary | ICD-10-CM

## 2023-05-06 DIAGNOSIS — M5412 Radiculopathy, cervical region: Secondary | ICD-10-CM

## 2023-05-06 DIAGNOSIS — J8410 Pulmonary fibrosis, unspecified: Secondary | ICD-10-CM

## 2023-05-06 DIAGNOSIS — J209 Acute bronchitis, unspecified: Secondary | ICD-10-CM

## 2023-05-06 MED ORDER — ALBUTEROL SULFATE HFA 108 (90 BASE) MCG/ACT IN AERS
2.0000 | INHALATION_SPRAY | Freq: Four times a day (QID) | RESPIRATORY_TRACT | 2 refills | Status: DC | PRN
Start: 2023-05-06 — End: 2023-11-05
  Filled 2023-05-06: qty 18, 25d supply, fill #0
  Filled 2023-06-30: qty 18, 25d supply, fill #1
  Filled 2023-08-11: qty 18, 25d supply, fill #2

## 2023-05-06 MED ORDER — FLUTICASONE-SALMETEROL 500-50 MCG/ACT IN AEPB
1.0000 | INHALATION_SPRAY | Freq: Two times a day (BID) | RESPIRATORY_TRACT | 2 refills | Status: DC
Start: 2023-05-06 — End: 2024-01-14
  Filled 2023-05-06: qty 60, 30d supply, fill #0
  Filled 2023-06-30: qty 60, 30d supply, fill #1
  Filled 2023-08-11: qty 60, 30d supply, fill #2

## 2023-05-06 NOTE — Assessment & Plan Note (Signed)
Patient is seen for 2 weeks follow-up for his recent acute bronchitis.  He has cough with increased sputum production and fever or chills.  He completed a course of azithromycin.  Today patient reports improvement of his symptoms.  He still have a mild cough with sputum production but otherwise is doing better.  He has no coughing during examination.  He denies any shortness of breath or wheezing.  I advised patient to continue his Advair and albuterol inhaler.  Refill order placed.

## 2023-05-06 NOTE — Assessment & Plan Note (Signed)
Pressure is well-controlled at 132/83.  He he is taking amlodipine 5 mg and valsartan 325 mg.  He report occasional hypotension with systolic in the 90 - 100s when he gets back from church on Sunday.  He typically does not eat or drink anything prior to church but he did take his medication in the morning.  He uses a wrist cuff to check his blood pressure at that time.  He typically does feel tired with this blood pressure and feels better after drinking coffee.  I advised patient to obtain a brachial blood pressure cuff and keep a log.  Patient can take his blood pressure medication in the afternoon on Sunday if he does not eat or drink in the morning.  No change in his current regimen at this time

## 2023-05-06 NOTE — Assessment & Plan Note (Signed)
No evidence of oral candidiasis seen on exam.  Would not extend his fluconazole course.

## 2023-05-06 NOTE — Progress Notes (Signed)
CC: 2 weeks follow-up on acute bronchitis  HPI:  [name] is a 62 y.o. living with hypertension, post COVID pulmonary fibrosis, cervical radiculopathy, who presents to the clinic for 2 weeks follow-up on his acute bronchitis treatment.  Please see problem based charting for detail  Past Medical History:  Diagnosis Date   Anemia    CHF (congestive heart failure) (HCC)    Dental infection 08/02/2018   GERD (gastroesophageal reflux disease)    Hypertension    Shingles rash 07/07/2019   Thalassemia, alpha (HCC)    Viral gastritis 02/08/2019   Review of Systems:  per HPI  Physical Exam:  Vitals:   05/06/23 1402  BP: 132/83  Pulse: 96  Temp: 98.2 F (36.8 C)  TempSrc: Oral  SpO2: 98%  Weight: 153 lb 9.6 oz (69.7 kg)   Physical Exam Constitutional:      General: He is not in acute distress.    Appearance: He is not ill-appearing.  HENT:     Head:     Comments: No coughing during examination No evidence of oral candidiasis Eyes:     General:        Right eye: No discharge.        Left eye: No discharge.  Cardiovascular:     Rate and Rhythm: Normal rate and regular rhythm.  Pulmonary:     Effort: Pulmonary effort is normal. No respiratory distress.     Breath sounds: Normal breath sounds. No wheezing.  Musculoskeletal:     Cervical back: Normal range of motion.  Skin:    General: Skin is warm.  Neurological:     Mental Status: He is alert. Mental status is at baseline.  Psychiatric:        Mood and Affect: Mood normal.      Assessment & Plan:   See Encounters Tab for problem based charting.  Acute bronchitis Patient is seen for 2 weeks follow-up for his recent acute bronchitis.  He has cough with increased sputum production and fever or chills.  He completed a course of azithromycin.  Today patient reports improvement of his symptoms.  He still have a mild cough with sputum production but otherwise is doing better.  He has no coughing during  examination.  He denies any shortness of breath or wheezing.  I advised patient to continue his Advair and albuterol inhaler.  Refill order placed.  Hypertension Pressure is well-controlled at 132/83.  He he is taking amlodipine 5 mg and valsartan 325 mg.  He report occasional hypotension with systolic in the 90 - 100s when he gets back from church on Sunday.  He typically does not eat or drink anything prior to church but he did take his medication in the morning.  He uses a wrist cuff to check his blood pressure at that time.  He typically does feel tired with this blood pressure and feels better after drinking coffee.  I advised patient to obtain a brachial blood pressure cuff and keep a log.  Patient can take his blood pressure medication in the afternoon on Sunday if he does not eat or drink in the morning.  No change in his current regimen at this time  Cervical radiculopathy He is taking Cymbalta, Tylenol and gabapentin with some relief.  He saw his neurosurgeon today who has performed an injection that gave him significant relief.  He also is doing well with physical therapy.  Oral thrush No evidence of oral candidiasis seen on exam.  Would not extend his fluconazole course.   Patient discussed with Dr. Mayford Knife

## 2023-05-06 NOTE — Patient Instructions (Signed)
It was nice seeing you in the clinic today  1.  Your symptoms of coughing will slowly getting better in the next 1- 2 week.  Please continue using your inhalers  2.  Please keep a log of your blood pressure.  Make sure you check your blood pressure on the upper arm.  3.  Please continue to do physical therapy and follow-up with the neurosurgeon as scheduled  Please follow-up in 3 months, sooner if needed  Dr. Cyndie Chime

## 2023-05-06 NOTE — Assessment & Plan Note (Signed)
He is taking Cymbalta, Tylenol and gabapentin with some relief.  He saw his neurosurgeon today who has performed an injection that gave him significant relief.  He also is doing well with physical therapy.

## 2023-05-08 NOTE — Progress Notes (Signed)
Internal Medicine Clinic Attending  Case discussed with Dr. Nguyen  At the time of the visit.  We reviewed the resident's history and exam and pertinent patient test results.  I agree with the assessment, diagnosis, and plan of care documented in the resident's note. 

## 2023-05-10 ENCOUNTER — Encounter: Payer: Self-pay | Admitting: *Deleted

## 2023-06-07 ENCOUNTER — Other Ambulatory Visit (HOSPITAL_COMMUNITY): Payer: Self-pay

## 2023-06-30 ENCOUNTER — Other Ambulatory Visit (HOSPITAL_COMMUNITY): Payer: Self-pay

## 2023-07-19 ENCOUNTER — Other Ambulatory Visit (HOSPITAL_COMMUNITY): Payer: Self-pay

## 2023-08-11 ENCOUNTER — Other Ambulatory Visit (HOSPITAL_COMMUNITY): Payer: Self-pay

## 2023-08-23 ENCOUNTER — Other Ambulatory Visit (HOSPITAL_COMMUNITY): Payer: Self-pay

## 2023-08-30 ENCOUNTER — Other Ambulatory Visit (HOSPITAL_COMMUNITY): Payer: Self-pay

## 2023-09-23 ENCOUNTER — Other Ambulatory Visit (HOSPITAL_COMMUNITY): Payer: Self-pay

## 2023-09-30 ENCOUNTER — Ambulatory Visit: Payer: Medicare Other | Admitting: Student

## 2023-09-30 ENCOUNTER — Ambulatory Visit (HOSPITAL_COMMUNITY)
Admission: RE | Admit: 2023-09-30 | Discharge: 2023-09-30 | Disposition: A | Discharge status: Home / Self Care | Payer: Medicare Other | Source: Ambulatory Visit | Attending: Student in an Organized Health Care Education/Training Program | Admitting: Student in an Organized Health Care Education/Training Program

## 2023-09-30 ENCOUNTER — Encounter: Payer: Self-pay | Admitting: Student

## 2023-09-30 ENCOUNTER — Other Ambulatory Visit (HOSPITAL_COMMUNITY): Payer: Self-pay

## 2023-09-30 VITALS — BP 138/88 | HR 91 | Temp 98.2°F | Wt 146.2 lb

## 2023-09-30 DIAGNOSIS — J45909 Unspecified asthma, uncomplicated: Secondary | ICD-10-CM | POA: Diagnosis not present

## 2023-09-30 DIAGNOSIS — J4540 Moderate persistent asthma, uncomplicated: Secondary | ICD-10-CM | POA: Diagnosis present

## 2023-09-30 DIAGNOSIS — E785 Hyperlipidemia, unspecified: Secondary | ICD-10-CM | POA: Diagnosis not present

## 2023-09-30 DIAGNOSIS — U099 Post covid-19 condition, unspecified: Secondary | ICD-10-CM

## 2023-09-30 DIAGNOSIS — K219 Gastro-esophageal reflux disease without esophagitis: Secondary | ICD-10-CM

## 2023-09-30 DIAGNOSIS — J069 Acute upper respiratory infection, unspecified: Secondary | ICD-10-CM | POA: Diagnosis present

## 2023-09-30 DIAGNOSIS — I10 Essential (primary) hypertension: Secondary | ICD-10-CM

## 2023-09-30 DIAGNOSIS — R1013 Epigastric pain: Secondary | ICD-10-CM

## 2023-09-30 DIAGNOSIS — K5904 Chronic idiopathic constipation: Secondary | ICD-10-CM | POA: Insufficient documentation

## 2023-09-30 DIAGNOSIS — E782 Mixed hyperlipidemia: Secondary | ICD-10-CM

## 2023-09-30 DIAGNOSIS — J8410 Pulmonary fibrosis, unspecified: Secondary | ICD-10-CM | POA: Diagnosis not present

## 2023-09-30 MED ORDER — PREDNISONE 5 MG PO TABS
5.0000 mg | ORAL_TABLET | Freq: Every day | ORAL | 0 refills | Status: DC
Start: 2023-09-30 — End: 2023-09-30
  Filled 2023-09-30: qty 5, 5d supply, fill #0

## 2023-09-30 MED ORDER — FLUTICASONE PROPIONATE 50 MCG/ACT NA SUSP
2.0000 | Freq: Every day | NASAL | 0 refills | Status: DC
Start: 2023-09-30 — End: 2023-12-03
  Filled 2023-09-30: qty 16, 30d supply, fill #0

## 2023-09-30 MED ORDER — POLYETHYLENE GLYCOL 3350 17 G PO PACK
17.0000 g | PACK | Freq: Every day | ORAL | 5 refills | Status: DC
Start: 2023-09-30 — End: 2023-11-05
  Filled 2023-09-30: qty 28, 28d supply, fill #0

## 2023-09-30 MED ORDER — PANTOPRAZOLE SODIUM 40 MG PO TBEC
40.0000 mg | DELAYED_RELEASE_TABLET | Freq: Every day | ORAL | 3 refills | Status: DC
Start: 2023-09-30 — End: 2024-01-14
  Filled 2023-09-30: qty 90, 90d supply, fill #0
  Filled 2023-12-16: qty 90, 90d supply, fill #1

## 2023-09-30 MED ORDER — PREDNISONE 20 MG PO TABS
40.0000 mg | ORAL_TABLET | Freq: Every day | ORAL | 0 refills | Status: DC
  Filled 2023-09-30: qty 10, 5d supply, fill #0

## 2023-09-30 MED ORDER — SENNA 8.6 MG PO TABS
1.0000 | ORAL_TABLET | Freq: Every day | ORAL | 0 refills | Status: DC | PRN
Start: 2023-09-30 — End: 2023-11-05
  Filled 2023-09-30: qty 100, 100d supply, fill #0

## 2023-09-30 NOTE — Progress Notes (Addendum)
Subjective:  CC: Cough, shortness of breath  HPI:  [name] is a 62 y.o. person with a past medical history stated below and presents today for 2-week history of cough and shortness of breath. Please see problem based assessment and plan for additional details.  Past Medical History:  Diagnosis Date   Anemia    CHF (congestive heart failure) (HCC)    Dental infection 08/02/2018   GERD (gastroesophageal reflux disease)    Hypertension    Shingles rash 07/07/2019   Thalassemia, alpha (HCC)    Viral gastritis 02/08/2019    Current Outpatient Medications on File Prior to Visit  Medication Sig Dispense Refill   acetaminophen (TYLENOL) 500 MG tablet Take 1,000 mg by mouth every 6 (six) hours as needed for mild pain.     albuterol (VENTOLIN HFA) 108 (90 Base) MCG/ACT inhaler Inhale 2 puffs into the lungs every 6 (six) hours as needed for wheezing or shortness of breath. 18 g 2   amLODipine (NORVASC) 5 MG tablet Take 1 tablet (5 mg total) by mouth daily. 30 tablet 11   atorvastatin (LIPITOR) 80 MG tablet Take 1 tablet (80 mg total) by mouth daily. 30 tablet 11   benzonatate (TESSALON PERLES) 100 MG capsule Take 1 capsule (100 mg total) by mouth 3 (three) times daily as needed for cough. 20 capsule 0   dextromethorphan-guaiFENesin (MUCINEX DM) 30-600 MG 12hr tablet Take 1 tablet by mouth 2 (two) times daily. 30 tablet 0   DULoxetine (CYMBALTA) 60 MG capsule Take 1 capsule (60 mg total) by mouth daily. 30 capsule 11   famotidine (PEPCID) 20 MG tablet Take 1 tablet (20 mg total) by mouth daily. 30 tablet 2   fluticasone-salmeterol (ADVAIR DISKUS) 500-50 MCG/ACT AEPB Inhale 1 puff into the lungs in the morning and at bedtime. 60 each 2   gabapentin (NEURONTIN) 300 MG capsule Take 3 capsules (900 mg total) by mouth 3 (three) times daily. 270 capsule 11   valsartan (DIOVAN) 320 MG tablet Take 1 tablet (320 mg total) by mouth daily. 30 tablet 11   No current facility-administered  medications on file prior to visit.    Review of Systems: Please see assessment and plan for pertinent positives and negatives.  Objective:   Vitals:   09/30/23 1500 09/30/23 1516  BP: (!) 145/88 138/88  Pulse: 95 91  Temp: 98.2 F (36.8 C)   TempSrc: Oral   SpO2: 99%   Weight: 146 lb 3.2 oz (66.3 kg)     Physical Exam: Constitutional: Well-appearing, in no acute distress Cardiovascular: regular rate and rhythm, no m/r/g Pulmonary/Chest: Scattered crackles most pronounced at the left lower lung, no wheezing rales or rhonchi.   ENT: Slight erythema of the right tympanic membrane, mild thrush of the tongue.  No pain with palpation of the maxillary sinuses.  Abdominal: soft, non-tender, non-distended Extremities: No edema of the lower extremities bilaterally Skin: warm and dry Psych: Pleasant affect   Assessment & Plan:  Hyperlipidemia Hyperlipidemia previously well-controlled on Lipitor 80 mg daily, last lipid panel greater than 1 year ago Plan: Repeat lipid panel   Chronic idiopathic constipation Patient endorses chronic constipation, which she takes MiraLAX for.  Patient states that he does not use MiraLAX he has 1 hard bowel movement a day, when he does use MiraLAX he has 2-3 soft bowel movements per day.  He denies blood in the stool, nausea, or vomiting.  Denies polyp removed on recent colonoscopy, 7-year repeat colonoscopy recommendation given.  Plan: Discussed good bowel hygiene Continue MiraLAX, add senna Recommended that the patient uses MiraLAX consistently to establish regular bowel movements.   Asthma This is a 62 year old gentleman with history of COVID induced lung fibrosis and PFT proven asthma who is presenting with 2-week history of cough (worse at night), shortness of breath, increased sputum production, and sinus pressure.  He says he has had increased yellow sputum which he was coughing up.  He has been taking his Advair daily, as well as his Ventolin.   He feels that his inhalers have helped with his symptoms. He denies pleuritic chest pain, ongoing fevers, bloody sputum, night sweats, or orthopnea.  He denies sinus pain or purulent nasal discharge. He does feel that his symptoms have improved over the last 2 weeks. Plan: 40 mg prednisone, 5 days Chest x-ray rule out superimposed Continue inhaler regiment Flonase  Hypertension Well-controlled on current regimen of valsartan and amlodipine. Plan: BMP    Patient seen with Dr. Sullivan Lone MD [facility]  PGY-1 Pager: [phone]  Phone: [phone] Date 09/30/2023  Time 10:41 PM

## 2023-09-30 NOTE — Assessment & Plan Note (Signed)
Hyperlipidemia previously well-controlled on Lipitor 80 mg daily, last lipid panel greater than 1 year ago Plan: Repeat lipid panel

## 2023-09-30 NOTE — Assessment & Plan Note (Signed)
Patient endorses chronic constipation, which she takes MiraLAX for.  Patient states that he does not use MiraLAX he has 1 hard bowel movement a day, when he does use MiraLAX he has 2-3 soft bowel movements per day.  He denies blood in the stool, nausea, or vomiting.  Denies polyp removed on recent colonoscopy, 7-year repeat colonoscopy recommendation given.  Plan: Discussed good bowel hygiene Continue MiraLAX, add senna Recommended that the patient uses MiraLAX consistently to establish regular bowel movements.

## 2023-09-30 NOTE — Assessment & Plan Note (Signed)
Well-controlled on current regimen of valsartan and amlodipine. Plan: BMP

## 2023-09-30 NOTE — Assessment & Plan Note (Addendum)
This is a 62 year old gentleman with history of COVID induced lung fibrosis and PFT proven asthma who is presenting with 2-week history of cough (worse at night), shortness of breath, increased sputum production, and sinus pressure.  He says he has had increased yellow sputum which he was coughing up.  He has been taking his Advair daily, as well as his Ventolin.  He feels that his inhalers have helped with his symptoms. He denies pleuritic chest pain, ongoing fevers, bloody sputum, night sweats, or orthopnea.  He denies sinus pain or purulent nasal discharge. He does feel that his symptoms have improved over the last 2 weeks. Plan: 40 mg prednisone, 5 days Chest x-ray rule out superimposed Continue inhaler regiment Flonase

## 2023-09-30 NOTE — Patient Instructions (Addendum)
Thank you, Mr.Derril Wentzel for allowing [place] to provide your care today.  I have ordered the following tests for you:  Lab Orders         BMP8+Anion Gap         Lipid Profile     Chest Xray  Start the following medications: Meds ordered this encounter  Medications   pantoprazole (PROTONIX) 40 MG tablet    Sig: Take 1 tablet (40 mg total) by mouth daily.    Dispense:  90 tablet    Refill:  3    IM program   polyethylene glycol (MIRALAX) 17 g packet    Sig: Take 17 g (1 capful) by mouth daily.    Dispense:  28 packet    Refill:  5    IM program   fluticasone (FLONASE) 50 MCG/ACT nasal spray    Sig: Place 2 sprays into both nostrils daily.    Dispense:  16 g    Refill:  0   senna (SENOKOT) 8.6 MG TABS tablet    Sig: Take 1 tablet (8.6 mg total) by mouth daily as needed for mild constipation.    Dispense:  120 tablet    Refill:  0   DISCONTD: predniSONE (DELTASONE) 5 MG tablet    Sig: Take 1 tablet (5 mg total) by mouth daily with breakfast.    Dispense:  5 tablet    Refill:  0   predniSONE (DELTASONE) 20 MG tablet    Sig: Take 2 tablets (40 mg total) by mouth daily with breakfast.    Dispense:  10 tablet    Refill:  0     Follow up: 4-6 months for routine follow up   We look forward to seeing you next time. Please call our clinic at [phone] if you have any questions or concerns. The best time to call is Monday-Friday from 9am-4pm, but there is someone available 24/7. If after hours or the weekend, call the main hospital number and ask for the Internal Medicine Resident On-Call. If you need medication refills, please notify your pharmacy one week in advance and they will send [place] a request.   Thank you for trusting me with your care. Wishing you the best!  Lovie Macadamia MD [facility]

## 2023-10-01 LAB — LIPID PANEL
Chol/HDL Ratio: 5 {ratio} (ref 0.0–5.0)
Cholesterol, Total: 181 mg/dL (ref 100–199)
HDL: 36 mg/dL — ABNORMAL LOW (ref >39)
LDL Chol Calc (NIH): 80 mg/dL (ref 0–99)
Triglycerides: 407 mg/dL — ABNORMAL HIGH (ref 0–149)
VLDL Cholesterol Cal: 65 mg/dL — ABNORMAL HIGH (ref 5–40)

## 2023-10-01 LAB — BMP8+ANION GAP
Anion Gap: 17 mmol/L (ref 10.0–18.0)
BUN/Creatinine Ratio: 7 — ABNORMAL LOW (ref 10–24)
BUN: 7 mg/dL — ABNORMAL LOW (ref 8–27)
CO2: 23 mmol/L (ref 20–29)
Calcium: 9.4 mg/dL (ref 8.6–10.2)
Chloride: 104 mmol/L (ref 96–106)
Creatinine, Ser: 1 mg/dL (ref 0.76–1.27)
Glucose: 91 mg/dL (ref 70–99)
Potassium: 4.3 mmol/L (ref 3.5–5.2)
Sodium: 144 mmol/L (ref 134–144)
eGFR: 85 mL/min/{1.73_m2} (ref >59)

## 2023-10-04 ENCOUNTER — Telehealth: Payer: Self-pay

## 2023-10-04 NOTE — Progress Notes (Signed)
 Internal Medicine Clinic Attending  I was physically present during the key portions of the resident provided service and participated in the medical decision making of patient's management care. I reviewed pertinent patient test results.  The assessment, diagnosis, and plan were formulated together and I agree with the documentation in the resident's note.  Tyson Alias, MD

## 2023-10-04 NOTE — Telephone Encounter (Signed)
Pt states he received a phone called from a doctor on Friday- and told him to come back to the hospital. Pt states he did not come back to hospital because he didn't understand English. Pt wanted to know what should he do. Requesting x-ray test results and medication for cough. Please call back.

## 2023-10-06 ENCOUNTER — Other Ambulatory Visit (HOSPITAL_COMMUNITY): Payer: Self-pay

## 2023-10-06 ENCOUNTER — Encounter: Payer: Self-pay | Admitting: Student

## 2023-10-06 ENCOUNTER — Other Ambulatory Visit: Payer: Self-pay

## 2023-10-06 ENCOUNTER — Ambulatory Visit (INDEPENDENT_AMBULATORY_CARE_PROVIDER_SITE_OTHER): Payer: Medicare Other | Admitting: Student

## 2023-10-06 VITALS — BP 128/84 | HR 85 | Temp 97.8°F | Ht 62.0 in | Wt 144.4 lb

## 2023-10-06 DIAGNOSIS — J189 Pneumonia, unspecified organism: Secondary | ICD-10-CM | POA: Diagnosis not present

## 2023-10-06 DIAGNOSIS — K5904 Chronic idiopathic constipation: Secondary | ICD-10-CM | POA: Diagnosis not present

## 2023-10-06 MED ORDER — AMOXICILLIN 500 MG PO CAPS
1000.0000 mg | ORAL_CAPSULE | Freq: Three times a day (TID) | ORAL | 0 refills | Status: AC
  Filled 2023-10-06: qty 30, 5d supply, fill #0

## 2023-10-06 MED ORDER — AZITHROMYCIN 250 MG PO TABS
ORAL_TABLET | ORAL | 0 refills | Status: AC
  Filled 2023-10-06: qty 6, 5d supply, fill #0

## 2023-10-06 NOTE — Progress Notes (Signed)
Established Patient Office Visit  Subjective   Patient ID: Eric Ingram, male    DOB: [birth year]  Age: 62 y.o. MRN: [id number]  Chief Complaint  Patient presents with   Follow-up   Cough   Diarrhea     Pt stated that for 2 wks now he is having black stool     Patient is a 62 yo with a past medical history stated below who presents today for follow-up for cough and constipation. Please see problem based assessment and plan for additional details.    Of note, attempted to get [facility] interpreter on the phone several times throughout visit with phone number always leading to a busy signal. With patient's permission, patient visit was completed with the help of a staff member whose native language is [place].    Past Medical History:  Diagnosis Date   Anemia    CHF (congestive heart failure) (HCC)    Dental infection 08/02/2018   GERD (gastroesophageal reflux disease)    Hypertension    Shingles rash 07/07/2019   Thalassemia, alpha (HCC)    Viral gastritis 02/08/2019      Review of Systems  Constitutional:  Positive for fever.  HENT:  Positive for ear pain and sore throat.   Respiratory:  Positive for cough and sputum production.   Gastrointestinal:  Positive for abdominal pain and constipation.     Objective:     BP 128/84 (BP Location: Left Arm, Cuff Size: Normal)   Pulse 85   Temp 97.8 F (36.6 C) (Oral)   Ht 5\' 2"  (1.575 m)   Wt 144 lb 6.4 oz (65.5 kg)   SpO2 99%   BMI 26.41 kg/m  BP Readings from Last 3 Encounters:  10/06/23 128/84  09/30/23 138/88  05/06/23 132/83   Wt Readings from Last 3 Encounters:  10/06/23 144 lb 6.4 oz (65.5 kg)  09/30/23 146 lb 3.2 oz (66.3 kg)  05/06/23 153 lb 9.6 oz (69.7 kg)      Physical Exam HENT:     Head: Normocephalic and atraumatic.     Ears:     Comments: Slightly erythematous external ear canal bilaterally.    Nose: Nose normal.     Mouth/Throat:     Mouth: Mucous membranes are moist.     Pharynx:  Posterior oropharyngeal erythema present.  Eyes:     Extraocular Movements: Extraocular movements intact.     Conjunctiva/sclera: Conjunctivae normal.     Pupils: Pupils are equal, round, and reactive to light.  Neck:     Comments: Negative for post-auricular, submandibular, or cervical lymphadenopathy.  Cardiovascular:     Rate and Rhythm: Normal rate and regular rhythm.     Pulses: Normal pulses.     Heart sounds: Normal heart sounds.  Pulmonary:     Effort: Pulmonary effort is normal.     Comments: Ronchi right lower lobe on auscultation. Upper airway congestion/wet cough.  Abdominal:     General: Bowel sounds are normal.     Palpations: Abdomen is soft.     Tenderness: There is abdominal tenderness.  Skin:    General: Skin is warm and dry.  Neurological:     General: No focal deficit present.     Mental Status: He is alert.  Psychiatric:        Mood and Affect: Mood normal.        Behavior: Behavior normal.    Results for orders placed or performed in visit on 10/06/23  CBC with  Diff  Result Value Ref Range   WBC 16.5 (H) 3.4 - 10.8 x10E3/uL   RBC 5.28 4.14 - 5.80 x10E6/uL   Hemoglobin 12.2 (L) 13.0 - 17.7 g/dL   Hematocrit 16.1 09.6 - 51.0 %   MCV 76 (L) 79 - 97 fL   MCH 23.1 (L) 26.6 - 33.0 pg   MCHC 30.6 (L) 31.5 - 35.7 g/dL   RDW 04.5 (H) 40.9 - 81.1 %   Platelets 442 150 - 450 x10E3/uL   Neutrophils 72 Not Estab. %   Lymphs 16 Not Estab. %   Monocytes 9 Not Estab. %   Eos 2 Not Estab. %   Basos 0 Not Estab. %   Neutrophils Absolute 12.0 (H) 1.4 - 7.0 x10E3/uL   Lymphocytes Absolute 2.7 0.7 - 3.1 x10E3/uL   Monocytes Absolute 1.5 (H) 0.1 - 0.9 x10E3/uL   EOS (ABSOLUTE) 0.3 0.0 - 0.4 x10E3/uL   Basophils Absolute 0.1 0.0 - 0.2 x10E3/uL   Immature Granulocytes 1 Not Estab. %   Immature Grans (Abs) 0.1 0.0 - 0.1 x10E3/uL    Last CBC Lab Results  Component Value Date   WBC 16.5 (H) 10/06/2023   HGB 12.2 (L) 10/06/2023   HCT 39.9 10/06/2023   MCV 76 (L)  10/06/2023   MCH 23.1 (L) 10/06/2023   RDW 17.3 (H) 10/06/2023   PLT 442 10/06/2023     The 10-year ASCVD risk score (Arnett DK, et al., 2019) is: 13.4%    Assessment & Plan:   Problem List Items Addressed This Visit       Respiratory   Community acquired pneumonia - Primary    Patient last seen 09/30/23 for cough and shortness of breath. Patient was treated for asthma exacerbation with Flonase, inhaler regiment with Advair and Ventolin, and 40 mg prednisone for 5 days. Chest x-ray from that visit showed no acute cardiopulmonary disease. Patient states he finished the prednisone course and although the prednisone and his inhalers with helpful initially, he began having worsening cough, increased yellow sputum, pain behind his ears, and has worsening shortness of breath.   Today he was afebrile and without sinus pain. On exam, patient was found to have ronchi along right lower lobe as well as upper airway congestion. He appeared to be recovering from an URI with patient complaining of sore throat, cough and ear pain. Will treat as post-viral community acquired pneumonia with broad spectrum antibiotics.  - Patient will take amoxicillin 1000 mg TID for 5 days and azithromycin 500 mg first day followed by 250 mg days 2-5.  - Patient to continue using inhalers and flonase for symptom relief.  - Ordered CBC today       Relevant Medications   azithromycin (ZITHROMAX Z-PAK) 250 MG tablet   amoxicillin (AMOXIL) 500 MG capsule   Other Relevant Orders   CBC with Diff (Completed)     Digestive   Chronic idiopathic constipation    Patient concerned about BM, stating he has multiple 3-4 dark stools every day. Stools were described as dark brown to black and well formed. He feels relief with BM. Denies any hematochezia or blood on the toilet paper after wiping. Abdomen soft and tender on physical exam. Did not appear distended. Has been taking miralax and senna as needed since last [facility] visit on  10/17. Recent upper endoscopy and colonoscopy 2024 reassuring with 7-year follow up. Will encourage patient to continue current regimen.  - Continue miralax 17 g daily  - Continue senna 8.6  mg daily as needed - Patient to return in 4-6 weeks for follow up, told to return earlier if he notices blood in stool and/or he has decreased PO intake due to abdominal pain/discomfort      Return 4-6 weeks, for follow up on cough and constipation.  Patient seen with Dr. Sol Blazing.   Deva Ron Colbert Coyer, MD

## 2023-10-06 NOTE — Patient Instructions (Addendum)
Thank you, [name] for allowing [place] to provide your care today. Today we discussed your coughing and constipation.   I have ordered the following labs for you:  Lab Orders  No laboratory test(s) ordered today     Tests ordered today:  - none  Referrals ordered today:   Referral Orders  No referral(s) requested today     I have ordered the following medication/changed the following medications:   Stop the following medications: There are no discontinued medications.   Start the following medications: No orders of the defined types were placed in this encounter.    Follow up:  - 4-6 weeks or earlier if needed  Remember:   For your cough:  - Please pick up your antibiotics which you will take for 5 days total: - Amoxicillin: 1000 mg three times a day, for 5 days (with or without food) - Azithromycin: 500 mg first day, then 250 mg for 4 days (one hour before eating food) - Please call our office if your symptoms do not improve or get worse - Please call 911 or go to the nearest emergency room if you develop nausea, vomiting, can't keep food or drink down,  or you have worsening shortness of breath  For your constipation - Please take your Senokot and Miralax for constipation.  - Please call our office if your bowel movements seem to have red blood or if you have diarrhea (runny, light brown, yellow stool) that doesn't improve  Should you have any questions or concerns please call the internal medicine clinic at [phone].    Eric Bangert Colbert Coyer, MD PGY-1 Internal Medicine Teaching [facility]

## 2023-10-07 DIAGNOSIS — J189 Pneumonia, unspecified organism: Secondary | ICD-10-CM | POA: Insufficient documentation

## 2023-10-07 LAB — CBC WITH DIFFERENTIAL/PLATELET
Basophils Absolute: 0.1 10*3/uL (ref 0.0–0.2)
Basos: 0 %
EOS (ABSOLUTE): 0.3 10*3/uL (ref 0.0–0.4)
Eos: 2 %
Hematocrit: 39.9 % (ref 37.5–51.0)
Hemoglobin: 12.2 g/dL — ABNORMAL LOW (ref 13.0–17.7)
Immature Grans (Abs): 0.1 10*3/uL (ref 0.0–0.1)
Immature Granulocytes: 1 %
Lymphocytes Absolute: 2.7 10*3/uL (ref 0.7–3.1)
Lymphs: 16 %
MCH: 23.1 pg — ABNORMAL LOW (ref 26.6–33.0)
MCHC: 30.6 g/dL — ABNORMAL LOW (ref 31.5–35.7)
MCV: 76 fL — ABNORMAL LOW (ref 79–97)
Monocytes Absolute: 1.5 10*3/uL — ABNORMAL HIGH (ref 0.1–0.9)
Monocytes: 9 %
Neutrophils Absolute: 12 10*3/uL — ABNORMAL HIGH (ref 1.4–7.0)
Neutrophils: 72 %
Platelets: 442 10*3/uL (ref 150–450)
RBC: 5.28 x10E6/uL (ref 4.14–5.80)
RDW: 17.3 % — ABNORMAL HIGH (ref 11.6–15.4)
WBC: 16.5 10*3/uL — ABNORMAL HIGH (ref 3.4–10.8)

## 2023-10-07 NOTE — Assessment & Plan Note (Addendum)
Patient concerned about BM, stating he has multiple 3-4 dark stools every day. Stools were described as dark brown to black and well formed. He feels relief with BM. Denies any hematochezia or blood on the toilet paper after wiping. Abdomen soft and tender on physical exam. Did not appear distended. Has been taking miralax and senna as needed since last [facility] visit on 10/17. Recent upper endoscopy and colonoscopy 2024 reassuring with 7-year follow up. Will encourage patient to continue current regimen.  - Continue miralax 17 g daily  - Continue senna 8.6 mg daily as needed - Patient to return in 4-6 weeks for follow up, told to return earlier if he notices blood in stool and/or he has decreased PO intake due to abdominal pain/discomfort

## 2023-10-07 NOTE — Assessment & Plan Note (Addendum)
Patient last seen 09/30/23 for cough and shortness of breath. Patient was treated for asthma exacerbation with Flonase, inhaler regiment with Advair and Ventolin, and 40 mg prednisone for 5 days. Chest x-ray from that visit showed no acute cardiopulmonary disease. Patient states he finished the prednisone course and although the prednisone and his inhalers with helpful initially, he began having worsening cough, increased yellow sputum, pain behind his ears, and has worsening shortness of breath.   Today he was afebrile and without sinus pain. On exam, patient was found to have ronchi along right lower lobe as well as upper airway congestion. He appeared to be recovering from an URI with patient complaining of sore throat, cough and ear pain. Will treat as post-viral community acquired pneumonia with broad spectrum antibiotics.  - Patient will take amoxicillin 1000 mg TID for 5 days and azithromycin 500 mg first day followed by 250 mg days 2-5.  - Patient to continue using inhalers and flonase for symptom relief.  - Ordered CBC today

## 2023-10-08 NOTE — Progress Notes (Addendum)
Internal Medicine Clinic Attending  I was physically present during the key portions of the resident provided service and participated in the medical decision making of patient's management care. I reviewed pertinent patient test results.  The assessment, diagnosis, and plan were formulated together and I agree with the documentation in the resident's note.  Patient with worsening cough, sputum production, dyspnea, subjective fevers, and leukocytosis following 2-3 weeks of URI symptoms. Pulmonary exam with focal rhonchi over RLL, c/f CAP. CXR one week ago without focal findings, however symptoms and exam have changed/worsened since that time per patient history and chart review from visit 10/17. Started on therapy for CAP, return precautions provided.  Although patient reported having multiple stools per day, he notes they are hard and accompanied by bloating that is relieved by BM. Advised daily stool softener and laxative to assist with regular, soft BM. F/u in 4 weeks.  Dickie La, MD

## 2023-10-19 NOTE — Progress Notes (Signed)
Patient called with [facility] interpreter Cloyd Stagers [phone]) to review results and see how he has been doing with his symptoms. Will try again, otherwise will send letter and/or discuss results at upcoming [facility] clinic appointment on 11/22 at 10:15 AM.

## 2023-10-28 ENCOUNTER — Other Ambulatory Visit (HOSPITAL_COMMUNITY): Payer: Self-pay

## 2023-10-28 ENCOUNTER — Other Ambulatory Visit: Payer: Self-pay | Admitting: Student

## 2023-10-28 DIAGNOSIS — E782 Mixed hyperlipidemia: Secondary | ICD-10-CM

## 2023-10-28 DIAGNOSIS — M5412 Radiculopathy, cervical region: Secondary | ICD-10-CM

## 2023-10-28 DIAGNOSIS — I10 Essential (primary) hypertension: Secondary | ICD-10-CM

## 2023-10-29 ENCOUNTER — Other Ambulatory Visit (HOSPITAL_COMMUNITY): Payer: Self-pay

## 2023-10-29 MED ORDER — DULOXETINE HCL 60 MG PO CPEP
60.0000 mg | ORAL_CAPSULE | Freq: Every day | ORAL | 11 refills | Status: DC
  Filled 2023-10-29: qty 30, 30d supply, fill #0
  Filled 2023-12-03: qty 30, 30d supply, fill #1
  Filled 2024-01-07: qty 30, 30d supply, fill #2
  Filled 2024-02-14: qty 30, 30d supply, fill #3

## 2023-10-29 MED ORDER — ATORVASTATIN CALCIUM 80 MG PO TABS
80.0000 mg | ORAL_TABLET | Freq: Every day | ORAL | 11 refills | Status: DC
  Filled 2023-10-29: qty 30, 30d supply, fill #0
  Filled 2023-12-03: qty 30, 30d supply, fill #1
  Filled 2024-01-07: qty 30, 30d supply, fill #2
  Filled 2024-02-14: qty 30, 30d supply, fill #3

## 2023-10-29 MED ORDER — VALSARTAN 320 MG PO TABS
320.0000 mg | ORAL_TABLET | Freq: Every day | ORAL | 11 refills | Status: DC
  Filled 2023-10-29: qty 30, 30d supply, fill #0
  Filled 2023-12-03: qty 30, 30d supply, fill #1
  Filled 2024-01-07: qty 30, 30d supply, fill #2

## 2023-11-03 ENCOUNTER — Other Ambulatory Visit (HOSPITAL_COMMUNITY): Payer: Self-pay

## 2023-11-04 ENCOUNTER — Other Ambulatory Visit (HOSPITAL_COMMUNITY): Payer: Self-pay

## 2023-11-05 ENCOUNTER — Ambulatory Visit (INDEPENDENT_AMBULATORY_CARE_PROVIDER_SITE_OTHER): Payer: Medicare Other | Admitting: Student

## 2023-11-05 ENCOUNTER — Encounter: Payer: Self-pay | Admitting: Student

## 2023-11-05 ENCOUNTER — Other Ambulatory Visit (HOSPITAL_COMMUNITY): Payer: Self-pay

## 2023-11-05 ENCOUNTER — Ambulatory Visit: Payer: Medicare Other

## 2023-11-05 VITALS — BP 155/87 | HR 74 | Temp 98.1°F | Wt 145.6 lb

## 2023-11-05 DIAGNOSIS — J8410 Pulmonary fibrosis, unspecified: Secondary | ICD-10-CM

## 2023-11-05 DIAGNOSIS — K5904 Chronic idiopathic constipation: Secondary | ICD-10-CM

## 2023-11-05 DIAGNOSIS — K219 Gastro-esophageal reflux disease without esophagitis: Secondary | ICD-10-CM | POA: Diagnosis not present

## 2023-11-05 DIAGNOSIS — I10 Essential (primary) hypertension: Secondary | ICD-10-CM | POA: Diagnosis not present

## 2023-11-05 DIAGNOSIS — J189 Pneumonia, unspecified organism: Secondary | ICD-10-CM

## 2023-11-05 MED ORDER — POLYETHYLENE GLYCOL 3350 17 G PO PACK
17.0000 g | PACK | Freq: Every day | ORAL | 5 refills | Status: AC
Start: 2023-11-05 — End: ?
  Filled 2023-11-05: qty 28, 28d supply, fill #0

## 2023-11-05 MED ORDER — SENNA 8.6 MG PO TABS
1.0000 | ORAL_TABLET | Freq: Every day | ORAL | 0 refills | Status: AC | PRN
  Filled 2023-11-05: qty 100, 100d supply, fill #0

## 2023-11-05 MED ORDER — ALBUTEROL SULFATE HFA 108 (90 BASE) MCG/ACT IN AERS
2.0000 | INHALATION_SPRAY | Freq: Four times a day (QID) | RESPIRATORY_TRACT | 2 refills | Status: DC | PRN
  Filled 2023-11-05: qty 18, 25d supply, fill #0
  Filled 2023-12-16 (×2): qty 18, 25d supply, fill #1

## 2023-11-05 NOTE — Patient Instructions (Addendum)
Thank you, Mr.Long [place]enport for allowing [place] to provide your care today. Today we discussed   Blood pressure - Come back in 4 weeks; a nurse will check your blood pressure to make sure it is controlled -bring your blood pressure readings -Take your medications 2 hours before coming to the office  Constipation -  Continue taking one packet of Miralax daily -  Continue taking the senna  For acid reflux -continue taking omeprazole  For lung infection -No more antibiotics -You can use the albuterol inhaler if you feel short of breath -If the coughing, the mucus get worse OR if you have fever with the termometer - call us back  I have ordered the following labs for you:   My Chart Access: [email or web address]  Please follow-up in: 4 weeks with a nurse visit    We look forward to seeing you next time. Please call our clinic at [phone] if you have any questions or concerns. The best time to call is Monday-Friday from 9am-4pm, but there is someone available 24/7. If after hours or the weekend, call the main hospital number and ask for the Internal Medicine Resident On-Call. If you need medication refills, please notify your pharmacy one week in advance and they will send [place] a request.   Thank you for letting us take part in your care. Wishing you the best!  Morene Crocker, MD 11/05/2023, 10:14 AM [facility]

## 2023-11-05 NOTE — Progress Notes (Unsigned)
Subjective:  CC: follow up  HPI:  [name] is a 62 y.o. male with a past medical history stated below and presents today for PNA, GERD, and constipation follow up. Please see problem based assessment and plan for additional details. This encounter was facilitated by [facility] Interpreter.  Past Medical History:  Diagnosis Date   Anemia    CHF (congestive heart failure) (HCC)    Dental infection 08/02/2018   GERD (gastroesophageal reflux disease)    Hypertension    Shingles rash 07/07/2019   Thalassemia, alpha (HCC)    Viral gastritis 02/08/2019    Current Outpatient Medications on File Prior to Visit  Medication Sig Dispense Refill   acetaminophen (TYLENOL) 500 MG tablet Take 1,000 mg by mouth every 6 (six) hours as needed for mild pain.     amLODipine (NORVASC) 5 MG tablet Take 1 tablet (5 mg total) by mouth daily. 30 tablet 11   atorvastatin (LIPITOR) 80 MG tablet Take 1 tablet (80 mg total) by mouth daily. 30 tablet 11   benzonatate (TESSALON PERLES) 100 MG capsule Take 1 capsule (100 mg total) by mouth 3 (three) times daily as needed for cough. 20 capsule 0   dextromethorphan-guaiFENesin (MUCINEX DM) 30-600 MG 12hr tablet Take 1 tablet by mouth 2 (two) times daily. 30 tablet 0   DULoxetine (CYMBALTA) 60 MG capsule Take 1 capsule (60 mg total) by mouth daily. 30 capsule 11   famotidine (PEPCID) 20 MG tablet Take 1 tablet (20 mg total) by mouth daily. 30 tablet 2   fluticasone (FLONASE) 50 MCG/ACT nasal spray Place 2 sprays into both nostrils daily. 16 g 0   fluticasone-salmeterol (ADVAIR DISKUS) 500-50 MCG/ACT AEPB Inhale 1 puff into the lungs in the morning and at bedtime. 60 each 2   gabapentin (NEURONTIN) 300 MG capsule Take 3 capsules (900 mg total) by mouth 3 (three) times daily. 270 capsule 11   pantoprazole (PROTONIX) 40 MG tablet Take 1 tablet (40 mg total) by mouth daily. 90 tablet 3   predniSONE (DELTASONE) 20 MG tablet Take 2 tablets (40 mg  total) by mouth daily with breakfast. 10 tablet 0   valsartan (DIOVAN) 320 MG tablet Take 1 tablet (320 mg total) by mouth daily. 30 tablet 11   No current facility-administered medications on file prior to visit.    Family History  Problem Relation Age of Onset   Colon cancer Neg Hx    Stomach cancer Neg Hx    Esophageal cancer Neg Hx     Social History   Socioeconomic History   Marital status: Married    Spouse name: Not on file   Number of children: 4   Years of education: Not on file   Highest education level: Not on file  Occupational History   Occupation: Unemployed  Tobacco Use   Smoking status: Former    Current packs/day: 0.00    Average packs/day: 1 pack/day for 5.0 years (5.0 ttl pk-yrs)    Types: Cigarettes    Start date: 12/14/1994    Quit date: 12/15/1999    Years since quitting: 23.9   Smokeless tobacco: Never  Vaping Use   Vaping status: Never Used  Substance and Sexual Activity   Alcohol use: Not Currently    Comment: former   Drug use: Never   Sexual activity: Not on file  Other Topics Concern   Not on file  Social History Narrative   Not on file   Social Determinants  of Health   Financial Resource Strain: Not on file  Food Insecurity: Not on file  Transportation Needs: Not on file  Physical Activity: Not on file  Stress: Not on file  Social Connections: Not on file  Intimate Partner Violence: Not on file    Review of Systems: ROS negative except for what is noted on the assessment and plan.  Objective:   Vitals:   11/05/23 0923  BP: (!) 155/87  Pulse: 74  Temp: 98.1 F (36.7 C)  TempSrc: Oral  SpO2: 98%  Weight: 145 lb 9.6 oz (66 kg)    Physical Exam: Constitutional: well-appearing man sitting in chair, in no acute distress HENT: normocephalic atraumatic, mucous membranes moist Eyes: conjunctiva non-erythematous Neck: supple Cardiovascular: regular rate and rhythm, no m/r/g Pulmonary/Chest: normal work of breathing on room  air, lungs clear to auscultation bilaterally Abdominal: soft, non-tender, non-distended MSK: normal bulk and tone Neurological: alert & oriented x 3 Skin: warm and dry Psych: Pleasant mood and affect       11/05/2023    9:26 AM  Depression screen PHQ 2/9  Decreased Interest 0  Down, Depressed, Hopeless 0  PHQ - 2 Score 0        No data to display           Assessment & Plan:   Hypertension Not at goal today. Upon review of medications, patient had taken in morning dose of Amlodipine but was yet to take Valsartan. Noted headaches at times when his blood pressure was elevated, but with both medications SBPs usually ~130-140s. Feeling at his baseline right now without HA, vision changes, chest pain or shortness of breath.  - Will not make medication changes at this time - Instructed patient to monitor BP at home and bring reading post 1-2 hours of taking meds - Return to clinic in 4 weeks for RN BP check  Community acquired pneumonia S/p Amox and azithromycin course for PNA. Patient reports improvement in fever, sputum production, and coughing symptoms. Has been able to return to his baseline level of active with a minor residual, non productive cough. Uses albuterol inhaler intermittently. On exam today, CTAB without wheezing of rales. Will re-order albuterol inhaler today and provided reassurance about improvement. No antibiotics needed at this time.  Gastro-esophageal reflux History of 1 cm hiatal hernia and EGD on 02/2023 with gastritis. Previously with significant dyspepsia and dark stools, none recently. Reports improvement with Protonix 40 mg daily  Chronic idiopathic constipation Patient with constipation associated with abdominal bloating and nausea in the past. On today's follow up patient has been diligent about his use of Miralax and Senna S with relief. Has one BM per day without straining, melena or hematochezia.  - Continue Miralax 17 g daily  - Continue senna 8.6 mg  daily PRN   Return in about 4 weeks (around 12/03/2023) for RN HTN visit.  Patient discussed with Dr. Burna Forts, MD [facility] Internal Medicine Residency Program  11/07/2023, 7:37 PM

## 2023-11-07 NOTE — Assessment & Plan Note (Signed)
History of 1 cm hiatal hernia and EGD on 02/2023 with gastritis. Previously with significant dyspepsia and dark stools, none recently. Reports improvement with Protonix 40 mg daily

## 2023-11-07 NOTE — Assessment & Plan Note (Signed)
S/p Amox and azithromycin course for PNA. Patient reports improvement in fever, sputum production, and coughing symptoms. Has been able to return to his baseline level of active with a minor residual, non productive cough. Uses albuterol inhaler intermittently. On exam today, CTAB without wheezing of rales. Will re-order albuterol inhaler today and provided reassurance about improvement. No antibiotics needed at this time.

## 2023-11-07 NOTE — Assessment & Plan Note (Signed)
Not at goal today. Upon review of medications, patient had taken in morning dose of Amlodipine but was yet to take Valsartan. Noted headaches at times when his blood pressure was elevated, but with both medications SBPs usually ~130-140s. Feeling at his baseline right now without HA, vision changes, chest pain or shortness of breath.  - Will not make medication changes at this time - Instructed patient to monitor BP at home and bring reading post 1-2 hours of taking meds - Return to clinic in 4 weeks for RN BP check

## 2023-11-07 NOTE — Assessment & Plan Note (Signed)
Patient with constipation associated with abdominal bloating and nausea in the past. On today's follow up patient has been diligent about his use of Miralax and Senna S with relief. Has one BM per day without straining, melena or hematochezia.  - Continue Miralax 17 g daily  - Continue senna 8.6 mg daily PRN

## 2023-11-08 NOTE — Progress Notes (Signed)
Internal Medicine Clinic Attending  Case discussed with the resident at the time of the visit.  We reviewed the resident's history and exam and pertinent patient test results.  I agree with the assessment, diagnosis, and plan of care documented in the resident's note.

## 2023-11-08 NOTE — Addendum Note (Signed)
Addended by: Derrek Monaco on: 11/08/2023 08:51 AM   Modules accepted: Level of Service

## 2023-12-03 ENCOUNTER — Encounter: Payer: Self-pay | Admitting: Internal Medicine

## 2023-12-03 ENCOUNTER — Other Ambulatory Visit (HOSPITAL_COMMUNITY): Payer: Self-pay

## 2023-12-03 ENCOUNTER — Telehealth: Payer: Self-pay | Admitting: *Deleted

## 2023-12-03 ENCOUNTER — Ambulatory Visit: Payer: Medicare Other | Admitting: *Deleted

## 2023-12-03 ENCOUNTER — Ambulatory Visit: Payer: Medicare Other | Admitting: Internal Medicine

## 2023-12-03 VITALS — BP 133/87 | HR 86 | Temp 98.4°F | Ht 62.0 in | Wt 146.0 lb

## 2023-12-03 DIAGNOSIS — B370 Candidal stomatitis: Secondary | ICD-10-CM | POA: Diagnosis not present

## 2023-12-03 DIAGNOSIS — J028 Acute pharyngitis due to other specified organisms: Secondary | ICD-10-CM

## 2023-12-03 DIAGNOSIS — J069 Acute upper respiratory infection, unspecified: Secondary | ICD-10-CM

## 2023-12-03 DIAGNOSIS — B9789 Other viral agents as the cause of diseases classified elsewhere: Secondary | ICD-10-CM | POA: Insufficient documentation

## 2023-12-03 MED ORDER — FLUTICASONE PROPIONATE 50 MCG/ACT NA SUSP
2.0000 | Freq: Every day | NASAL | 1 refills | Status: DC
  Filled 2023-12-03: qty 16, 30d supply, fill #0
  Filled 2024-01-07: qty 16, 30d supply, fill #1

## 2023-12-03 MED ORDER — NYSTATIN 100000 UNIT/ML MT SUSP
5.0000 mL | Freq: Four times a day (QID) | OROMUCOSAL | 0 refills | Status: DC
  Filled 2023-12-03: qty 60, 3d supply, fill #0

## 2023-12-03 NOTE — Telephone Encounter (Signed)
Pt came today for RN BP visit - he c/o sore throat, cough- coughing up mucous. Requesting to be seen today. I talked to Dr Daleen Bo - stated ok to add pt to Dr Mosie Epstein schedule @ 1045 Am. Doctor/nurse informed.

## 2023-12-03 NOTE — Progress Notes (Signed)
Subjective:  CC: sore throat, thrush  HPI:  Eric Ingram is a 62 y.o. male with a past medical history stated below and presents today for above. Please see problem based assessment and plan for additional details.  Past Medical History:  Diagnosis Date   Anemia    CHF (congestive heart failure) (HCC)    Dental infection 08/02/2018   GERD (gastroesophageal reflux disease)    Hypertension    Shingles rash 07/07/2019   Thalassemia, alpha (HCC)    Viral gastritis 02/08/2019    Current Outpatient Medications on File Prior to Visit  Medication Sig Dispense Refill   acetaminophen (TYLENOL) 500 MG tablet Take 1,000 mg by mouth every 6 (six) hours as needed for mild pain.     albuterol (VENTOLIN HFA) 108 (90 Base) MCG/ACT inhaler Inhale 2 puffs into the lungs every 6 (six) hours as needed for wheezing or shortness of breath. 18 g 2   amLODipine (NORVASC) 5 MG tablet Take 1 tablet (5 mg total) by mouth daily. 30 tablet 11   atorvastatin (LIPITOR) 80 MG tablet Take 1 tablet (80 mg total) by mouth daily. 30 tablet 11   benzonatate (TESSALON PERLES) 100 MG capsule Take 1 capsule (100 mg total) by mouth 3 (three) times daily as needed for cough. 20 capsule 0   dextromethorphan-guaiFENesin (MUCINEX DM) 30-600 MG 12hr tablet Take 1 tablet by mouth 2 (two) times daily. 30 tablet 0   DULoxetine (CYMBALTA) 60 MG capsule Take 1 capsule (60 mg total) by mouth daily. 30 capsule 11   famotidine (PEPCID) 20 MG tablet Take 1 tablet (20 mg total) by mouth daily. 30 tablet 2   fluticasone-salmeterol (ADVAIR DISKUS) 500-50 MCG/ACT AEPB Inhale 1 puff into the lungs in the morning and at bedtime. 60 each 2   gabapentin (NEURONTIN) 300 MG capsule Take 3 capsules (900 mg total) by mouth 3 (three) times daily. 270 capsule 11   pantoprazole (PROTONIX) 40 MG tablet Take 1 tablet (40 mg total) by mouth daily. 90 tablet 3   polyethylene glycol (MIRALAX) 17 g packet Take 17 g (1 capful) by mouth daily. 28 packet 5    predniSONE (DELTASONE) 20 MG tablet Take 2 tablets (40 mg total) by mouth daily with breakfast. 10 tablet 0   senna (SENOKOT) 8.6 MG TABS tablet Take 1 tablet (8.6 mg total) by mouth daily as needed for mild constipation. 100 tablet 0   valsartan (DIOVAN) 320 MG tablet Take 1 tablet (320 mg total) by mouth daily. 30 tablet 11   No current facility-administered medications on file prior to visit.    Review of Systems: ROS negative except for as is noted on the assessment and plan.  Objective:   Vitals:   12/03/23 1016  BP: 133/87  Pulse: 86  Temp: 98.4 F (36.9 C)  TempSrc: Oral  SpO2: 95%  Weight: 146 lb (66.2 kg)  Height: 5\' 2"  (1.575 m)    Physical Exam: Constitutional: well-appearing, in no acute distress HENT: significant white/green oral thrush Cardiovascular: regular rate and rhythm, no m/r/g Pulmonary/Chest: normal work of breathing on room air, lungs clear to auscultation bilaterally  Assessment & Plan:   Sore throat (viral) Patient presents today for over a week of sore throat, sinus congestion, cough. Of note, he recently completed a course of amoxicillin and azithromycin for post-viral CAP. On further questioning, it appears his symptoms are similar to sinus congestion he gets often this time of year and during the spring. He has not  had a fever at home or in clinic today. Overall I feel his symptoms are due to a viral URI and allergies. I prescribed flonase and recommended supportive/symptomatic management. Return precautions given.   Oral thrush Patient has significant oral thrush. He does not use a spacer or rinse his mouth after using his Advair inhaler. Nystatin swish prescribed, recommended rinsing with water after using inhalers.     Patient seen with Dr. Cleda Daub  Monna Fam MD [facility] Internal [facility]  PGY-1 Pager: [phone] Date 12/03/2023  Time 11:10 AM

## 2023-12-03 NOTE — Assessment & Plan Note (Signed)
Patient has significant oral thrush. He does not use a spacer or rinse his mouth after using his Advair inhaler. Nystatin swish prescribed, recommended rinsing with water after using inhalers.

## 2023-12-03 NOTE — Progress Notes (Signed)
    Eric Ingram presented today for blood pressure check. Patient is prescribed blood pressure medications and I confirmed that patient did not take their blood pressure medication prior to today's appointment. Pt stated he usually take it at 0900 AM but skipped it this morning. Blood pressure was taken in the usual and appropriate manner using an automated BP cuff.     Vitals:   12/03/23 0944 12/03/23 1000  BP: (!) 141/82 133/86      Results of today's visit will be routed to [facility]  for review and further management.

## 2023-12-03 NOTE — Patient Instructions (Addendum)
Mr Addy,   It was a pleasure meeting you.   For your tongue, use the nystatin mouth rinse three times daily until the bottle is empty. Do not swallow it. Also, after you use your Advair inhaler, make sure to rinse your mouth with water.   For your sinus congestion, use the flonase spray as needed.   I think your sore throat is due to a virus. I recommend lots of fluids, lots of rest, and over the counter medications for sore throat. If your symptoms are getting worse or you develop a fever, please return to the clinic or the emergency department.   Thanks,  Dr Carlynn Purl   ng [place],   R?t vui ???c g?p b?n.   ??i v?i l??i c?a b?n, hy dng n??c [place] mi?ng nystatin ba l?n m?i ngy cho ??n khi h?t chai. ??ng nu?t n. Ngoi ra, sau khi s? d?ng ?ng ht Advair, hy nh? [place] mi?ng b?ng n??c.   ??i v?i tnh tr?ng t?c ngh?n xoang, hy s? d?ng thu?c x?t flonase khi c?n thi?t.   Ti ngh? b?nh vim h?ng c?a b?n l do virus. Ti khuyn b?n nn u?ng nhi?u n??c, ngh? ng?i nhi?u v dng cc lo?i thu?c tr? ?au h?ng khng k ??n. N?u cc tri?u ch?ng c?a b?n tr? nn tr?m tr?ng h?n ho?c b?n b? s?t, vui lng quay l?i phng khm ho?c khoa c?p c?u.   C?m ?n,  Ti?n s? Carlynn Purl

## 2023-12-03 NOTE — Assessment & Plan Note (Signed)
Patient presents today for over a week of sore throat, sinus congestion, cough. Of note, he recently completed a course of amoxicillin and azithromycin for post-viral CAP. On further questioning, it appears his symptoms are similar to sinus congestion he gets often this time of year and during the spring. He has not had a fever at home or in clinic today. Overall I feel his symptoms are due to a viral URI and allergies. I prescribed flonase and recommended supportive/symptomatic management. Return precautions given.

## 2023-12-06 NOTE — Addendum Note (Signed)
Addended by: Carlynn Purl C on: 12/06/2023 04:00 PM   Modules accepted: Level of Service

## 2023-12-06 NOTE — Progress Notes (Signed)
Internal Medicine Clinic Attending  I was physically present during the key portions of the resident provided service and participated in the medical decision making of patient's management care. I reviewed pertinent patient test results.  The assessment, diagnosis, and plan were formulated together and I agree with the documentation in the resident's note.  Gust Rung, DO

## 2023-12-16 ENCOUNTER — Other Ambulatory Visit (HOSPITAL_COMMUNITY): Payer: Self-pay

## 2023-12-17 ENCOUNTER — Other Ambulatory Visit (HOSPITAL_COMMUNITY): Payer: Self-pay

## 2023-12-27 ENCOUNTER — Other Ambulatory Visit (HOSPITAL_COMMUNITY): Payer: Self-pay

## 2024-01-07 ENCOUNTER — Other Ambulatory Visit (HOSPITAL_COMMUNITY): Payer: Self-pay

## 2024-01-14 ENCOUNTER — Ambulatory Visit (INDEPENDENT_AMBULATORY_CARE_PROVIDER_SITE_OTHER): Payer: Medicare Other | Admitting: Student

## 2024-01-14 ENCOUNTER — Other Ambulatory Visit (HOSPITAL_COMMUNITY): Payer: Self-pay

## 2024-01-14 VITALS — BP 153/98 | HR 79 | Temp 98.2°F | Ht 62.0 in | Wt 147.9 lb

## 2024-01-14 DIAGNOSIS — R051 Acute cough: Secondary | ICD-10-CM | POA: Diagnosis not present

## 2024-01-14 DIAGNOSIS — J8410 Pulmonary fibrosis, unspecified: Secondary | ICD-10-CM

## 2024-01-14 DIAGNOSIS — K219 Gastro-esophageal reflux disease without esophagitis: Secondary | ICD-10-CM | POA: Diagnosis not present

## 2024-01-14 MED ORDER — ALBUTEROL SULFATE HFA 108 (90 BASE) MCG/ACT IN AERS
2.0000 | INHALATION_SPRAY | Freq: Four times a day (QID) | RESPIRATORY_TRACT | 2 refills | Status: DC | PRN
  Filled 2024-01-14: qty 6.7, 25d supply, fill #0

## 2024-01-14 MED ORDER — DM-GUAIFENESIN ER 30-600 MG PO TB12
1.0000 | ORAL_TABLET | Freq: Two times a day (BID) | ORAL | 0 refills | Status: DC
  Filled 2024-01-14: qty 30, 15d supply, fill #0

## 2024-01-14 MED ORDER — PANTOPRAZOLE SODIUM 40 MG PO TBEC
40.0000 mg | DELAYED_RELEASE_TABLET | Freq: Two times a day (BID) | ORAL | 3 refills | Status: DC
  Filled 2024-01-14 – 2024-01-21 (×2): qty 60, 30d supply, fill #0
  Filled 2024-03-13: qty 60, 30d supply, fill #1
  Filled 2024-04-17: qty 60, 30d supply, fill #0

## 2024-01-14 MED ORDER — FLUTICASONE-SALMETEROL 500-50 MCG/ACT IN AEPB
1.0000 | INHALATION_SPRAY | Freq: Two times a day (BID) | RESPIRATORY_TRACT | 2 refills | Status: DC
  Filled 2024-01-14: qty 60, 30d supply, fill #0

## 2024-01-14 MED ORDER — PREDNISONE 20 MG PO TABS
40.0000 mg | ORAL_TABLET | Freq: Every day | ORAL | 0 refills | Status: AC
Start: 2024-01-14 — End: 2024-01-19
  Filled 2024-01-14: qty 10, 5d supply, fill #0

## 2024-01-14 NOTE — Progress Notes (Unsigned)
Subjective:  CC: Cough  HPI:  [name] is a 63 y.o. person with a past medical history stated below and presents today for the stated chief complaint. Please see problem based assessment and plan for additional details.  Past Medical History:  Diagnosis Date   Anemia    CHF (congestive heart failure) (HCC)    Dental infection 08/02/2018   GERD (gastroesophageal reflux disease)    Hypertension    Shingles rash 07/07/2019   Thalassemia, alpha (HCC)    Viral gastritis 02/08/2019    Current Outpatient Medications on File Prior to Visit  Medication Sig Dispense Refill   acetaminophen (TYLENOL) 500 MG tablet Take 1,000 mg by mouth every 6 (six) hours as needed for mild pain.     amLODipine (NORVASC) 5 MG tablet Take 1 tablet (5 mg total) by mouth daily. 30 tablet 11   atorvastatin (LIPITOR) 80 MG tablet Take 1 tablet (80 mg total) by mouth daily. 30 tablet 11   benzonatate (TESSALON PERLES) 100 MG capsule Take 1 capsule (100 mg total) by mouth 3 (three) times daily as needed for cough. 20 capsule 0   DULoxetine (CYMBALTA) 60 MG capsule Take 1 capsule (60 mg total) by mouth daily. 30 capsule 11   famotidine (PEPCID) 20 MG tablet Take 1 tablet (20 mg total) by mouth daily. 30 tablet 2   fluticasone (FLONASE) 50 MCG/ACT nasal spray Place 2 sprays into both nostrils daily. 16 g 1   gabapentin (NEURONTIN) 300 MG capsule Take 3 capsules (900 mg total) by mouth 3 (three) times daily. 270 capsule 11   nystatin (MYCOSTATIN) 100000 UNIT/ML suspension Take 5 mLs (500,000 Units total) by mouth 4 (four) times daily. 60 mL 0   polyethylene glycol (MIRALAX) 17 g packet Take 17 g (1 capful) by mouth daily. 28 packet 5   senna (SENOKOT) 8.6 MG TABS tablet Take 1 tablet (8.6 mg total) by mouth daily as needed for mild constipation. 100 tablet 0   valsartan (DIOVAN) 320 MG tablet Take 1 tablet (320 mg total) by mouth daily. 30 tablet 11   No current facility-administered medications on file prior  to visit.    Review of Systems: Please see assessment and plan for pertinent positives and negatives.  Objective:   Vitals:   01/14/24 1027 01/14/24 1031  BP: (!) 163/93 (!) 153/98  Pulse: 82 79  Temp: 98.2 F (36.8 C)   TempSrc: Oral   SpO2: 97%   Weight: 147 lb 14.4 oz (67.1 kg)   Height: 5\' 2"  (1.575 m)     Physical Exam: Constitutional: Well-appearing Cardiovascular: Regular rate and rhythm Pulmonary/Chest: Diffuse wheezing on auscultation bilaterally Abdominal: soft, non-tender, non-distended Extremities: No edema of the lower extremities bilaterally Psych: Pleasant affect Thought process is linear and is goal-directed.     Assessment & Plan:  Gastro-esophageal reflux Endorses ongoing acid reflux, burning pain, and bloating.  No dysphagia, dyspepsia, diarrhea, constipation.  Currently on pantoprazole 40 mg daily, will increase to twice daily. Plan: Increase pantoprazole to twice daily   Acute cough Patient endorses ongoing cough for about 2 weeks.  He states that last time he got steroids for me acquired pneumonia coverage, it helped improve his cough but it came back shortly after.  I had seen the patient in the past for similar issues.  At that time the patient has significant wheezing and he was given a 5-day course of prednisone.  This did not help resolve his cough, afterwards he was reevaluated  treated for presumed CAP.  He again presents with cough and wheezing today.  Denies fevers, chills, but does endorse some runny nose.  He does have chronic sinusitis.  Based on the patient's exam, most likely diagnosis acute asthma exacerbation.  He does state that he is taking all his medications consistently.  Plan: Prednisone 40 mg 5-day course Renew PFTs Reestablished with pulmonology Chest x-ray-patient unable to do this today, but if things do not get better he will get it on Monday. Hold off on antibiotics, unless patient fails to improve May benefit from  something like azithromycin for atypical coverage.    Patient discussed with Dr. Elliot Cousin MD [facility] Internal Medicine  PGY-1 Pager: [phone]  Phone: [phone] Date 01/16/2024  Time 11:37 PM

## 2024-01-14 NOTE — Patient Instructions (Signed)
Thank you, [name] for allowing [place] to provide your care today.  I have ordered the following tests for you:  Chest Xray  PFT  Referrals ordered today:    Referral Orders         Ambulatory referral to Pulmonology      I have ordered the following medication/changed the following medications:   Stop the following medications: Medications Discontinued During This Encounter  Medication Reason   predniSONE (DELTASONE) 20 MG tablet    dextromethorphan-guaiFENesin (MUCINEX DM) 30-600 MG 12hr tablet Reorder   fluticasone-salmeterol (ADVAIR DISKUS) 500-50 MCG/ACT AEPB Reorder   pantoprazole (PROTONIX) 40 MG tablet Reorder   albuterol (VENTOLIN HFA) 108 (90 Base) MCG/ACT inhaler Reorder     Start the following medications: Meds ordered this encounter  Medications   albuterol (VENTOLIN HFA) 108 (90 Base) MCG/ACT inhaler    Sig: Inhale 2 puffs into the lungs every 6 (six) hours as needed for wheezing or shortness of breath.    Dispense:  18 g    Refill:  2   dextromethorphan-guaiFENesin (MUCINEX DM) 30-600 MG 12hr tablet    Sig: Take 1 tablet by mouth 2 (two) times daily.    Dispense:  30 tablet    Refill:  0   fluticasone-salmeterol (ADVAIR DISKUS) 500-50 MCG/ACT AEPB    Sig: Inhale 1 puff into the lungs in the morning and at bedtime.    Dispense:  60 each    Refill:  2   pantoprazole (PROTONIX) 40 MG tablet    Sig: Take 1 tablet (40 mg total) by mouth 2 (two) times daily.    Dispense:  60 tablet    Refill:  3    IM program   predniSONE (DELTASONE) 20 MG tablet    Sig: Take 2 tablets (40 mg total) by mouth daily with breakfast for 5 days.    Dispense:  10 tablet    Refill:  0      Follow up:  1 month  for regular physical      We look forward to seeing you next time. Please call our clinic at [phone] if you have any questions or concerns. The best time to call is Monday-Friday from 9am-4pm, but there is someone available 24/7. If after hours or the weekend,  call the main hospital number and ask for the Internal Medicine Resident On-Call. If you need medication refills, please notify your pharmacy one week in advance and they will send [place] a request.   Thank you for trusting me with your care. Wishing you the best!  Lovie Macadamia MD [facility] Center

## 2024-01-16 DIAGNOSIS — R051 Acute cough: Secondary | ICD-10-CM | POA: Insufficient documentation

## 2024-01-16 NOTE — Assessment & Plan Note (Addendum)
Endorses ongoing acid reflux, burning pain, and bloating.  No dysphagia, dyspepsia, diarrhea, constipation.  Currently on pantoprazole 40 mg daily, will increase to twice daily. Plan: Increase pantoprazole to twice daily

## 2024-01-16 NOTE — Assessment & Plan Note (Addendum)
Patient endorses ongoing cough for about 2 weeks.  He states that last time he got steroids for me acquired pneumonia coverage, it helped improve his cough but it came back shortly after.  I had seen the patient in the past for similar issues.  At that time the patient has significant wheezing and he was given a 5-day course of prednisone.  This did not help resolve his cough, afterwards he was reevaluated treated for presumed CAP.  He again presents with cough and wheezing today.  Denies fevers, chills, but does endorse some runny nose.  He does have chronic sinusitis.  Based on the patient's exam, most likely diagnosis acute asthma exacerbation.  He does state that he is taking all his medications consistently.  Plan: Prednisone 40 mg 5-day course Renew PFTs Reestablished with pulmonology Chest x-ray-patient unable to do this today, but if things do not get better he will get it on Monday. Hold off on antibiotics, unless patient fails to improve May benefit from something like azithromycin for atypical coverage.

## 2024-01-20 ENCOUNTER — Other Ambulatory Visit (HOSPITAL_COMMUNITY): Payer: Self-pay

## 2024-01-20 ENCOUNTER — Telehealth: Payer: Self-pay | Admitting: *Deleted

## 2024-01-20 NOTE — Telephone Encounter (Signed)
 Call from pt stating his medicine cost $100.00 and he cannot afford. I called [facility] who stated it's the Advair .

## 2024-01-21 ENCOUNTER — Other Ambulatory Visit (HOSPITAL_COMMUNITY): Payer: Self-pay

## 2024-02-11 ENCOUNTER — Ambulatory Visit (INDEPENDENT_AMBULATORY_CARE_PROVIDER_SITE_OTHER): Payer: Medicare Other | Admitting: Student

## 2024-02-11 ENCOUNTER — Other Ambulatory Visit (HOSPITAL_COMMUNITY): Payer: Self-pay

## 2024-02-11 VITALS — BP 142/75 | HR 68 | Temp 98.0°F | Ht 62.0 in | Wt 146.4 lb

## 2024-02-11 DIAGNOSIS — R140 Abdominal distension (gaseous): Secondary | ICD-10-CM | POA: Diagnosis not present

## 2024-02-11 DIAGNOSIS — R1319 Other dysphagia: Secondary | ICD-10-CM | POA: Insufficient documentation

## 2024-02-11 DIAGNOSIS — I10 Essential (primary) hypertension: Secondary | ICD-10-CM

## 2024-02-11 DIAGNOSIS — K921 Melena: Secondary | ICD-10-CM | POA: Diagnosis not present

## 2024-02-11 MED ORDER — SUCRALFATE 1 G PO TABS
1.0000 g | ORAL_TABLET | Freq: Two times a day (BID) | ORAL | 0 refills | Status: DC
  Filled 2024-02-11: qty 60, 30d supply, fill #0

## 2024-02-11 MED ORDER — VALSARTAN 160 MG PO TABS
160.0000 mg | ORAL_TABLET | Freq: Every day | ORAL | 3 refills | Status: DC
  Filled 2024-02-11: qty 30, 30d supply, fill #0
  Filled 2024-03-13: qty 30, 30d supply, fill #1
  Filled 2024-04-17: qty 30, 30d supply, fill #0

## 2024-02-11 NOTE — Assessment & Plan Note (Addendum)
 Patient complains of recurrent esophageal dysphagia that happens with solid foods.  He feels it gets stuck in his mid chest but does not have nausea or vomiting that occur.  This does not happen with all foods.  He does not have any weight loss, fevers, but he does have recurrent melena as described elsewhere.  EGD last year showed reactive gastropathy but no H. pylori.  I think he would likely need another EGD and he is currently on pantoprazole 40 mg twice daily. - Continue pantoprazole 40 mg twice daily and start Carafate 1 g twice daily - Return to GI

## 2024-02-11 NOTE — Assessment & Plan Note (Signed)
 Patient has concerns of orthostatic dizziness with symptoms on orthostatic vital signs today but no significant drop in blood pressure.  He is having ongoing melena which could be contributing however due to his recurrent symptoms we will try a lower dose of valsartan. - Reduce valsartan to 160 mg daily - Return in 1 month for reevaluation of symptoms and blood pressure

## 2024-02-11 NOTE — Progress Notes (Signed)
   CC: Acute Concern of melena  HPI:  Eric Ingram is a 63 y.o. 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 Please see assessment and plan below for further details.  Review of Systems:   Pertinent items noted in HPI and/or A&P.  Physical Exam:  Vitals:   02/11/24 1000  BP: (!) 142/75  Pulse: 68  Temp: 98 F (36.7 C)  TempSrc: Oral  SpO2: 100%  Weight: 146 lb 6.4 oz (66.4 kg)  Height: 5\' 2"  (1.575 m)    Constitutional: Well-appearing adult male. In no acute distress. HEENT: Normocephalic, atraumatic, Sclera non-icteric, PERRL, EOM intact Cardio:Regular rate and rhythm. 2+ bilateral radial pulses. Pulm:Clear to auscultation bilaterally. Normal work of breathing on room air. Abdomen: Soft, non-tender, non-distended, positive bowel sounds. FAO:ZHYQMVHQ for extremity edema. Skin:Warm and dry. Neuro:Alert and oriented x3. No focal deficit noted. Psych:Pleasant mood and affect.   Assessment & Plan:   Hypertension Patient has concerns of orthostatic dizziness with symptoms on orthostatic vital signs today but no significant drop in blood pressure.  He is having ongoing melena which could be contributing however due to his recurrent symptoms we will try a lower dose of valsartan. - Reduce valsartan to 160 mg daily - Return in 1 month for reevaluation of symptoms and blood pressure  Melena Patient continues to experience intermittent melena and is currently having it.  He does not have any bright red blood in his stool.  He has associated abdominal bloating and epigastric abdominal pain but no nausea or vomiting.  He also has intermittent dysphagia with solid foods.  In 02/2023 he underwent EGD and colonoscopy which showed gastropathy and benign polyps but no H. pylori or ulcers.  He does not use NSAIDs. - CBC and iron panel today - Return to  GI for likely repeat colonoscopy and EGD  Esophageal dysphagia Patient complains of recurrent esophageal dysphagia that happens with solid foods.  He feels it gets stuck in his mid chest but does not have nausea or vomiting that occur.  This does not happen with all foods.  He does not have any weight loss, fevers, but he does have recurrent melena as described elsewhere.  EGD last year showed reactive gastropathy but no H. pylori.  I think he would likely need another EGD and he is currently on pantoprazole 40 mg twice daily. - Continue pantoprazole 40 mg twice daily and start Carafate 1 g twice daily - Return to GI    Patient discussed with Dr. Theodosia Paling, DO Internal [facility] Center Internal Medicine Resident PGY-2 Clinic Phone: [phone] Pager: [phone]

## 2024-02-11 NOTE — Assessment & Plan Note (Signed)
 Patient continues to experience intermittent melena and is currently having it.  He does not have any bright red blood in his stool.  He has associated abdominal bloating and epigastric abdominal pain but no nausea or vomiting.  He also has intermittent dysphagia with solid foods.  In 02/2023 he underwent EGD and colonoscopy which showed gastropathy and benign polyps but no H. pylori or ulcers.  He does not use NSAIDs. - CBC and iron panel today - Return to GI for likely repeat colonoscopy and EGD

## 2024-02-11 NOTE — Patient Instructions (Signed)
  Thank you, [name], for allowing [place] to provide your care today. Today we discussed . . .  > Blood pressure       -I would like you to take half of your valsartan dose.  I have sent in a new prescription to the pharmacy that is a pill of 160 mg.  If you are able to you can break your current valsartan 320 mg pills in half and take half but if this is difficult you can pick up the new prescription.  Please let us know if you continue to have dizziness on this dose. > Dark stools and bloating       -I would like you to see the gastroenterologist again and I have put their information below.  Please call them to schedule an appointment.  I have also sent in Carafate 1 g tablet twice daily that might help with the bloating.  Please take this twice daily and we will see you back in about 1 month.  [facility] [address] [place] [address] [place], [place] [address] [phone]  I have ordered the following labs for you:  Lab Orders         CBC with Diff         Iron, TIBC and Ferritin Panel         CMP14 + Anion Gap       Follow up:  1 month     Remember:     Should you have any questions or concerns please call the internal medicine clinic at [phone].     Rocky Morel, DO [facility]

## 2024-02-12 LAB — CBC WITH DIFFERENTIAL/PLATELET
Basophils Absolute: 0.1 10*3/uL (ref 0.0–0.2)
Basos: 1 %
EOS (ABSOLUTE): 0.6 10*3/uL — ABNORMAL HIGH (ref 0.0–0.4)
Eos: 7 %
Hematocrit: 36.8 % — ABNORMAL LOW (ref 37.5–51.0)
Hemoglobin: 11.5 g/dL — ABNORMAL LOW (ref 13.0–17.7)
Immature Grans (Abs): 0 10*3/uL (ref 0.0–0.1)
Immature Granulocytes: 0 %
Lymphocytes Absolute: 1.9 10*3/uL (ref 0.7–3.1)
Lymphs: 21 %
MCH: 23.2 pg — ABNORMAL LOW (ref 26.6–33.0)
MCHC: 31.3 g/dL — ABNORMAL LOW (ref 31.5–35.7)
MCV: 74 fL — ABNORMAL LOW (ref 79–97)
Monocytes Absolute: 0.5 10*3/uL (ref 0.1–0.9)
Monocytes: 6 %
Neutrophils Absolute: 5.6 10*3/uL (ref 1.4–7.0)
Neutrophils: 65 %
Platelets: 345 10*3/uL (ref 150–450)
RBC: 4.95 x10E6/uL (ref 4.14–5.80)
RDW: 15.8 % — ABNORMAL HIGH (ref 11.6–15.4)
WBC: 8.7 10*3/uL (ref 3.4–10.8)

## 2024-02-12 LAB — CMP14 + ANION GAP
ALT: 17 IU/L (ref 0–44)
AST: 24 IU/L (ref 0–40)
Albumin: 4.3 g/dL (ref 3.9–4.9)
Alkaline Phosphatase: 92 IU/L (ref 44–121)
Anion Gap: 15 mmol/L (ref 10.0–18.0)
BUN/Creatinine Ratio: 8 — ABNORMAL LOW (ref 10–24)
BUN: 8 mg/dL (ref 8–27)
Bilirubin Total: 0.7 mg/dL (ref 0.0–1.2)
CO2: 22 mmol/L (ref 20–29)
Calcium: 8.8 mg/dL (ref 8.6–10.2)
Chloride: 104 mmol/L (ref 96–106)
Creatinine, Ser: 1 mg/dL (ref 0.76–1.27)
Globulin, Total: 2.7 g/dL (ref 1.5–4.5)
Glucose: 78 mg/dL (ref 70–99)
Potassium: 3.9 mmol/L (ref 3.5–5.2)
Sodium: 141 mmol/L (ref 134–144)
Total Protein: 7 g/dL (ref 6.0–8.5)
eGFR: 85 mL/min/{1.73_m2} (ref >59)

## 2024-02-12 LAB — IRON,TIBC AND FERRITIN PANEL
Ferritin: 232 ng/mL (ref 30–400)
Iron Saturation: 40 % (ref 15–55)
Iron: 95 ug/dL (ref 38–169)
Total Iron Binding Capacity: 239 ug/dL — ABNORMAL LOW (ref 250–450)
UIBC: 144 ug/dL (ref 111–343)

## 2024-02-13 NOTE — Progress Notes (Signed)
 Internal Medicine Clinic Attending  Case discussed with the resident at the time of the visit.  We reviewed the resident's history and exam and pertinent patient test results.  I agree with the assessment, diagnosis, and plan of care documented in the resident's note.

## 2024-02-14 ENCOUNTER — Other Ambulatory Visit (HOSPITAL_COMMUNITY): Payer: Self-pay

## 2024-02-17 NOTE — Progress Notes (Signed)
 Slight drop in hgb consistent with persistent but mild melena. Mild eosinophilia without hypereosinophilia, recommend repeat CBC w/diff at follow up if not already repeated. Iron panel and CMP without significant abnormality. Continue with plan for GI evaluation of melena.  Do not relay.

## 2024-03-13 ENCOUNTER — Encounter: Payer: Medicare Other | Admitting: Student

## 2024-03-13 ENCOUNTER — Other Ambulatory Visit (HOSPITAL_COMMUNITY): Payer: Self-pay

## 2024-03-24 ENCOUNTER — Other Ambulatory Visit (HOSPITAL_COMMUNITY): Payer: Self-pay

## 2024-03-24 ENCOUNTER — Ambulatory Visit: Admitting: Student

## 2024-03-24 VITALS — BP 126/77 | HR 88 | Temp 98.3°F | Ht 62.0 in | Wt 144.5 lb

## 2024-03-24 DIAGNOSIS — K921 Melena: Secondary | ICD-10-CM

## 2024-03-24 DIAGNOSIS — I10 Essential (primary) hypertension: Secondary | ICD-10-CM | POA: Diagnosis not present

## 2024-03-24 DIAGNOSIS — J8410 Pulmonary fibrosis, unspecified: Secondary | ICD-10-CM

## 2024-03-24 DIAGNOSIS — G8929 Other chronic pain: Secondary | ICD-10-CM

## 2024-03-24 DIAGNOSIS — E785 Hyperlipidemia, unspecified: Secondary | ICD-10-CM

## 2024-03-24 DIAGNOSIS — J45909 Unspecified asthma, uncomplicated: Secondary | ICD-10-CM

## 2024-03-24 DIAGNOSIS — M25511 Pain in right shoulder: Secondary | ICD-10-CM

## 2024-03-24 DIAGNOSIS — J4540 Moderate persistent asthma, uncomplicated: Secondary | ICD-10-CM

## 2024-03-24 DIAGNOSIS — E782 Mixed hyperlipidemia: Secondary | ICD-10-CM

## 2024-03-24 DIAGNOSIS — J069 Acute upper respiratory infection, unspecified: Secondary | ICD-10-CM

## 2024-03-24 MED ORDER — ALBUTEROL SULFATE HFA 108 (90 BASE) MCG/ACT IN AERS
2.0000 | INHALATION_SPRAY | Freq: Four times a day (QID) | RESPIRATORY_TRACT | 2 refills | Status: DC | PRN
  Filled 2024-03-24: qty 6.7, 25d supply, fill #0

## 2024-03-24 MED ORDER — FLUTICASONE PROPIONATE 50 MCG/ACT NA SUSP
2.0000 | Freq: Every day | NASAL | 1 refills | Status: DC
  Filled 2024-03-24: qty 16, 30d supply, fill #0

## 2024-03-24 MED ORDER — ATORVASTATIN CALCIUM 80 MG PO TABS
80.0000 mg | ORAL_TABLET | Freq: Every day | ORAL | 11 refills | Status: AC
  Filled 2024-03-24 – 2024-04-17 (×2): qty 30, 30d supply, fill #0
  Filled 2024-05-23: qty 30, 30d supply, fill #1
  Filled 2024-06-23: qty 30, 30d supply, fill #2
  Filled 2024-07-24: qty 30, 30d supply, fill #3
  Filled 2024-08-30: qty 30, 30d supply, fill #4
  Filled 2024-10-17: qty 30, 30d supply, fill #5
  Filled 2024-11-16: qty 30, 30d supply, fill #6
  Filled 2024-12-18: qty 30, 30d supply, fill #7

## 2024-03-24 NOTE — Patient Instructions (Addendum)
 Thank you, Eric Ingram for allowing [place] to provide your care today.   I have ordered the following labs for you:  Lab Orders  No laboratory test(s) ordered today     Tests ordered today:  None  Referrals ordered today:   Referral Orders  No referral(s) requested today     I have ordered the following medication/changed the following medications:   Stop the following medications: Medications Discontinued During This Encounter  Medication Reason   atorvastatin (LIPITOR) 80 MG tablet Reorder   fluticasone (FLONASE) 50 MCG/ACT nasal spray Reorder   albuterol (VENTOLIN HFA) 108 (90 Base) MCG/ACT inhaler Reorder     Start the following medications: Meds ordered this encounter  Medications   albuterol (VENTOLIN HFA) 108 (90 Base) MCG/ACT inhaler    Sig: Inhale 2 puffs into the lungs every 6 (six) hours as needed for wheezing or shortness of breath.    Dispense:  6.7 g    Refill:  2   fluticasone (FLONASE) 50 MCG/ACT nasal spray    Sig: Place 2 sprays into both nostrils daily.    Dispense:  16 g    Refill:  1   atorvastatin (LIPITOR) 80 MG tablet    Sig: Take 1 tablet (80 mg total) by mouth daily.    Dispense:  30 tablet    Refill:  11    IM program     Follow up: 2-3 months for melena/blood in stool, hypertension, allergies, and right arm pain follow up    Remember:   - Please have the interpreter or family member help you make an appointment with [facility]/Dr. Hilary Hertz to follow up on intermittent episodes of blood in the stool:  Address: [address], [place], [place] [address] Phone: [phone]  - For your allergies and asthma:  Continue to take the Flonase and Abuterol inhaler. You can get Allegra over the counter at the pharmacy and start taking it every day while your allergies are worse with the pollen.     - For your right shoulder pain:  Consider using over the counter Lidocaine patches (put on right shoulder where pain is worst) and muscle  rub cream like Bengay:      - For your blood pressure, continue taking Valsartan 160 mg daily   Should you have any questions or concerns please call the internal medicine clinic at [phone].     [name], MD PGY-1 Internal Medicine Teaching Progam [facility] Internal Medicine Center

## 2024-03-26 DIAGNOSIS — G8929 Other chronic pain: Secondary | ICD-10-CM | POA: Insufficient documentation

## 2024-03-26 NOTE — Progress Notes (Signed)
 Established Patient Office Visit  Subjective   Patient ID: Eric Ingram, male    DOB: 07/18/1961  Age: 63 y.o. MRN: [id number]  Chief Complaint  Patient presents with   Shoulder Pain   Back Pain    Patient is a 63 y.o. with a past medical history stated below who presents today for follow-up for melena, hypertension, and worsening allergies. Patient requested refills for albuterol, atorvastatin, and flonase. An in-person [facility] interpreter is available in the visit today. Please see problem based assessment and plan for additional details.     Past Medical History:  Diagnosis Date   Anemia    CHF (congestive heart failure) (HCC)    Dental infection 08/02/2018   GERD (gastroesophageal reflux disease)    Hypertension    Shingles rash 07/07/2019   Thalassemia, alpha (HCC)    Viral gastritis 02/08/2019   Review of Systems  Constitutional:  Negative for fever.  Cardiovascular:  Negative for chest pain.  Gastrointestinal:  Negative for abdominal pain, blood in stool, melena, nausea and vomiting.      Objective:     BP 126/77 (BP Location: Left Arm, Patient Position: Sitting, Cuff Size: Normal)   Pulse 88   Temp 98.3 F (36.8 C) (Oral)   Ht 5\' 2"  (1.575 m)   Wt 144 lb 8 oz (65.5 kg)   SpO2 99%   BMI 26.43 kg/m  BP Readings from Last 3 Encounters:  03/24/24 126/77  02/11/24 (!) 142/75  01/14/24 (!) 153/98   Wt Readings from Last 3 Encounters:  03/24/24 144 lb 8 oz (65.5 kg)  02/11/24 146 lb 6.4 oz (66.4 kg)  01/14/24 147 lb 14.4 oz (67.1 kg)   Physical Exam HENT:     Head: Normocephalic and atraumatic.  Cardiovascular:     Rate and Rhythm: Normal rate and regular rhythm.  Pulmonary:     Effort: Pulmonary effort is normal.     Breath sounds: Normal breath sounds.  Abdominal:     General: Bowel sounds are normal.     Palpations: Abdomen is soft.  Skin:    General: Skin is warm.  Neurological:     General: No focal deficit present.     Mental  Status: He is alert.  Psychiatric:        Mood and Affect: Mood normal.    No results found for any visits on 03/24/24.  The 10-year ASCVD risk score (Arnett DK, et al., 2019) is: 13.1%    Assessment & Plan:   Problem List Items Addressed This Visit     Hypertension - Primary   Patient's BP 126/77 today. Denies any recent episodes of orthostatic dizziness, chest pain, or palpitations. Has been taking Valsartan 160 mg daily since his visit to [facility] on 2/28. CV exam today unremarkable.  Plan - Continue Valsartan 160 mg daily       Relevant Medications   atorvastatin (LIPITOR) 80 MG tablet   Melena   Patient denies any further episodes of melena since 2/28 visit. States he is still taking daily PPI and carafate. Denies any lightheadedness, overt bleeding, or pallor. Did not follow up with GI as he is concerned that his insurance may not cover the costs. Discussed importance of following up with GI regularly to help determine if repeat endoscopy is needed. Last EGD/colonoscopy March 2024. Patient agreed to making appointment with GI, interpreter helped patient make the appointment (04/25/24). Patient to call clinic if he experiences repeat episodes of melena or any of  his previous symptoms.  Plan - Continue pantoprazole 40 mg BID, carafate 1g BID - Follow up on GI appointment 04/25/24 as needed      Hyperlipidemia   Relevant Medications   atorvastatin (LIPITOR) 80 MG tablet   Asthma   Patient uses albuterol inhaler for asthma as needed, requested refill today. He has been experiencing increased symptoms of head fullness and congestion due to worsening allergies. Lung exam unremarkable today. Will refill flonase for symptom relief, recommended using daily allergy medication and patient agreeable.  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 Physical exam reassuring with BUE strength and sensation WNL. Recommended OTC lidocaine patches and muscle cream. Provided pictures of OTC products on AVS.  Plan - Patient to treat with OTC lidocaine patches and muscle cream - Monitor, patient to make appointment if pain worsens or daily activities are negatively impacted      Other Visit Diagnoses       Upper respiratory tract infection, unspecified type       Relevant Medications   fluticasone (FLONASE) 50 MCG/ACT nasal spray      Patient discussed with Dr. Jarvis Mesa.  Return 2-3 months, for melena/blood in stool, hypertension, allergies, and right arm pain follow up .   Eric Chamblin Arellano Zameza, MD

## 2024-03-26 NOTE — Assessment & Plan Note (Addendum)
 Patient denies any further episodes of melena since 2/28 visit. States he is still taking daily PPI and carafate. Denies any lightheadedness, overt bleeding, or pallor. Did not follow up with GI as he is concerned that his insurance may not cover the costs. Discussed importance of following up with GI regularly to help determine if repeat endoscopy is needed. Last EGD/colonoscopy March 2024. Patient agreed to making appointment with GI, interpreter helped patient make the appointment (04/25/24). Patient to call clinic if he experiences repeat episodes of melena or any of his previous symptoms.  Plan - Continue pantoprazole 40 mg BID, carafate 1g BID - Follow up on GI appointment 04/25/24 as needed

## 2024-03-26 NOTE — Assessment & Plan Note (Signed)
 Patient's BP 126/77 today. Denies any recent episodes of orthostatic dizziness, chest pain, or palpitations. Has been taking Valsartan 160 mg daily since his visit to [facility] on 2/28. CV exam today unremarkable.  Plan - Continue Valsartan 160 mg daily

## 2024-03-26 NOTE — Assessment & Plan Note (Signed)
 Patient uses albuterol inhaler for asthma as needed, requested refill today. He has been experiencing increased symptoms of head fullness and congestion due to worsening allergies. Lung exam unremarkable today. Will refill flonase for symptom relief, recommended using daily allergy medication and patient agreeable.  Plan - Continue albuterol inhaler PRN - Flonase refill today  - START daily Allegra (over the counter)

## 2024-03-26 NOTE — Assessment & Plan Note (Addendum)
 Patient with history of adhesive capsulitis of both shoulders, with associated right shoulder pain. Physical exam reassuring with BUE strength and sensation WNL. Recommended OTC lidocaine patches and muscle cream. Provided pictures of OTC products on AVS.  Plan - Patient to treat with OTC lidocaine patches and muscle cream - Monitor, patient to make appointment if pain worsens or daily activities are negatively impacted

## 2024-03-27 NOTE — Addendum Note (Signed)
 Addended by: Handy Mcloud L on: 03/27/2024 09:20 AM   Modules accepted: Level of Service

## 2024-03-27 NOTE — Addendum Note (Signed)
 Addended by: Alfredia Desanctis L on: 03/27/2024 09:21 AM   Modules accepted: Level of Service

## 2024-03-27 NOTE — Progress Notes (Signed)
 Internal Medicine Clinic Attending  Case discussed with the resident at the time of the visit.  We reviewed the resident's history and exam and pertinent patient test results.  I agree with the assessment, diagnosis, and plan of care documented in the resident's note.

## 2024-04-17 ENCOUNTER — Other Ambulatory Visit: Payer: Self-pay

## 2024-04-24 NOTE — Progress Notes (Unsigned)
 se     Brigitte Canard, PA-C [address] [place], [place]  [address] Phone: [phone]   Primary Care Physician: Sheree Dieter, MD  Primary Gastroenterologist:  Brigitte Canard, PA-C / Alvester Johnson, MD   Chief Complaint:  Alfonse Angle Stools       HPI:   Eric Ingram is a 63 y.o. male, who speaks [place] - Interpreter present here today, here to followup for dark stool, as well as some abdominal discomfort and history of colon polyps.  He last saw Dr. General Kenner 01/2023 to evaluate dark Stools.  He has history of gastritis and acid reflux.  He denies NSAID or alcohol use.  Not currently taking Pepto-Bismol or iron.  He continues to have dark stools.  Has episodes of epigastric discomfort and burning in his mid chest.  He ran out of Carafate  and needs refill.  He continues taking pantoprazole  40 Mg daily.  Denies bright red rectal bleeding.  02/2023 EGD by Dr. General Kenner:  1 cm Hiatal Hernai.  A small diverticulum in upper third of esophagus.  Gastritis.  Normal duodenum.  Biopsies Negative for H. Pylori.  02/2023 Colonoscopy by Dr. General Kenner: 3 (3mm - 6mm) polyps removed.  Pathology showed 2 small tubular adenomas and 1 hyperplastic polyp.  Good Prep.  Small internal hemorrhoids.  7 year repeat.  02/11/24 Labs: CBC, CMP, Iron panal: Hgb 11.5, MCV 74, Total Iron 95, Iron Sat 40%, Ferritin 232.  November 2019: chronic microcytic anemia, he had significant microcytosis with normal iron studies, found to have alpha thalassemia.  Hx Constipation and GERD.  02/2018 EGD: Small esophageal diverticulum, gastritis / gastric polyp.  Biopsies negative for H. Pylori.   02/2018 Colonoscopy: 8 mm sessile serrated polyp removed. Repeat in 5 years.  08/2021 CT Abd / Pelvis with Contrast: BILATERAL renal cysts with upper pole scarring LEFT kidney.  Fatty infiltration of liver.  LEFT inguinal hernia containing fat.  No acute intra-abdominal or intrapelvic abnormalities.  Current Outpatient Medications   Medication Sig Dispense Refill   acetaminophen  (TYLENOL ) 500 MG tablet Take 1,000 mg by mouth every 6 (six) hours as needed for mild pain.     albuterol  (VENTOLIN  HFA) 108 (90 Base) MCG/ACT inhaler Inhale 2 puffs into the lungs every 6 (six) hours as needed for wheezing or shortness of breath. 6.7 g 2   atorvastatin  (LIPITOR ) 80 MG tablet Take 1 tablet (80 mg total) by mouth daily. 30 tablet 11   DULoxetine  (CYMBALTA ) 60 MG capsule Take 1 capsule (60 mg total) by mouth daily. 30 capsule 11   fluticasone  (FLONASE ) 50 MCG/ACT nasal spray Place 2 sprays into both nostrils daily. 16 g 1   gabapentin  (NEURONTIN ) 300 MG capsule Take 3 capsules (900 mg total) by mouth 3 (three) times daily. 270 capsule 11   polyethylene glycol (MIRALAX ) 17 g packet Take 17 g (1 capful) by mouth daily. 28 packet 5   senna (SENOKOT) 8.6 MG TABS tablet Take 1 tablet (8.6 mg total) by mouth daily as needed for mild constipation. 100 tablet 0   valsartan  (DIOVAN ) 160 MG tablet Take 1 tablet (160 mg total) by mouth daily. 30 tablet 3   fluticasone -salmeterol (ADVAIR  DISKUS) 500-50 MCG/ACT AEPB Inhale 1 puff into the lungs in the morning and at bedtime. 60 each 2   pantoprazole  (PROTONIX ) 40 MG tablet Take 1 tablet (40 mg total) by mouth daily. 90 tablet 3   sucralfate  (CARAFATE ) 1 g tablet Take 1 tablet (1 g total) by mouth 3 (three) times  daily before meals. 90 tablet 11   No current facility-administered medications for this visit.    Allergies as of 04/25/2024   (No Known Allergies)    Past Medical History:  Diagnosis Date   Anemia    CHF (congestive heart failure) (HCC)    Dental infection 08/02/2018   GERD (gastroesophageal reflux disease)    Hypertension    Shingles rash 07/07/2019   Thalassemia, alpha (HCC)    Viral gastritis 02/08/2019    Past Surgical History:  Procedure Laterality Date   ANTERIOR CERVICAL DECOMP/DISCECTOMY FUSION N/A 12/19/2021   Procedure: Anterior Cervical Discectomy and Fusion  Cervical Four-Five-Cervical Five-Six;  Surgeon: Gearl Keens, MD;  Location: [facility] OR;  Service: Neurosurgery;  Laterality: N/A;  3C   COLONOSCOPY  2019   COLONOSCOPY WITH ESOPHAGOGASTRODUODENOSCOPY (EGD)  03/01/2023    Review of Systems:    All systems reviewed and negative except where noted in HPI.    Physical Exam:  BP 118/68   Pulse 83   Ht 5\' 2"  (1.575 m)   Wt 141 lb (64 kg)   BMI 25.79 kg/m  No LMP for male patient.  General: Well-nourished, well-developed in no acute distress.  Lungs: Clear to auscultation bilaterally. Non-labored. Heart: Regular rate and rhythm, no murmurs rubs or gallops.  Abdomen: Bowel sounds are normal; Abdomen is Soft; No hepatosplenomegaly, masses or hernias;  No Abdominal Tenderness; No guarding or rebound tenderness. Neuro: Alert and oriented x 3.  Grossly intact.  Psych: Alert and cooperative, normal mood and affect.   Imaging Studies: No results found.  Labs: CBC    Component Value Date/Time   WBC 8.7 02/11/2024 1058   WBC 10.1 01/25/2023 0936   RBC 4.95 02/11/2024 1058   RBC 5.65 01/25/2023 0936   HGB 11.5 (L) 02/11/2024 1058   HCT 36.8 (L) 02/11/2024 1058   PLT 345 02/11/2024 1058   MCV 74 (L) 02/11/2024 1058   MCH 23.2 (L) 02/11/2024 1058   MCH 22.6 (L) 12/11/2021 1334   MCHC 31.3 (L) 02/11/2024 1058   MCHC 33.1 01/25/2023 0936   RDW 15.8 (H) 02/11/2024 1058   LYMPHSABS 1.9 02/11/2024 1058   MONOABS 0.6 01/25/2023 0936   EOSABS 0.6 (H) 02/11/2024 1058   BASOSABS 0.1 02/11/2024 1058    CMP     Component Value Date/Time   NA 141 02/11/2024 1058   K 3.9 02/11/2024 1058   CL 104 02/11/2024 1058   CO2 22 02/11/2024 1058   GLUCOSE 78 02/11/2024 1058   GLUCOSE 89 01/25/2023 0936   BUN 8 02/11/2024 1058   CREATININE 1.00 02/11/2024 1058   CALCIUM  8.8 02/11/2024 1058   PROT 7.0 02/11/2024 1058   ALBUMIN 4.3 02/11/2024 1058   AST 24 02/11/2024 1058   ALT 17 02/11/2024 1058   ALKPHOS 92 02/11/2024 1058   BILITOT 0.7  02/11/2024 1058   GFRNONAA >60 12/11/2021 1334   GFRAA >60 03/25/2020 1345       Assessment and Plan:   Camarion Dillow is a 63 y.o. y/o male returns for follow-up of:  Chronic dark Stools - Suspect due to Chronic Gastritis -Avoid Pepto Bismol -Check CBC and Hemoccults - If Heme + or persistent IDA, Then schedule Capsule Endoscopy  Chronic Gastritis ( H. Pylori Negative ) AND GERD -Continue Pantoprazole  40mg  every day, #90, 3 RF. -Refill Carafate  1g TID, #90, 11 RF. - He is instructed to avoid NSAIDS and Alcohol. - He is instructed to avoid Spicey and Acidic Foods - Recommend Lifestyle  Modifications to prevent Acid Reflux.  Rec. Avoid coffee, sodas, peppermint, garlic, onions, alcohol, citrus fruits, chocolate, tomatoes, fatty and spicey foods.  Avoid eating 2-3 hours before bedtime.    3.  Mild Anemia; History of Thalassemia -CBC, Iron panal, ferritin, B12, Folate -Not currently on Iron or B12.  4.  Hx adenomatous colon polyp -7 year repeat Colonoscopy will be due 02/3030.   Brigitte Canard, PA-C  Follow up 6 Months.

## 2024-04-25 ENCOUNTER — Encounter: Payer: Self-pay | Admitting: Physician Assistant

## 2024-04-25 ENCOUNTER — Ambulatory Visit (INDEPENDENT_AMBULATORY_CARE_PROVIDER_SITE_OTHER): Admitting: Physician Assistant

## 2024-04-25 ENCOUNTER — Other Ambulatory Visit (INDEPENDENT_AMBULATORY_CARE_PROVIDER_SITE_OTHER)

## 2024-04-25 ENCOUNTER — Other Ambulatory Visit (HOSPITAL_COMMUNITY): Payer: Self-pay

## 2024-04-25 VITALS — BP 118/68 | HR 83 | Ht 62.0 in | Wt 141.0 lb

## 2024-04-25 DIAGNOSIS — D649 Anemia, unspecified: Secondary | ICD-10-CM

## 2024-04-25 DIAGNOSIS — R195 Other fecal abnormalities: Secondary | ICD-10-CM | POA: Diagnosis not present

## 2024-04-25 DIAGNOSIS — D560 Alpha thalassemia: Secondary | ICD-10-CM

## 2024-04-25 DIAGNOSIS — K2970 Gastritis, unspecified, without bleeding: Secondary | ICD-10-CM

## 2024-04-25 DIAGNOSIS — K2950 Unspecified chronic gastritis without bleeding: Secondary | ICD-10-CM | POA: Diagnosis not present

## 2024-04-25 DIAGNOSIS — Z862 Personal history of diseases of the blood and blood-forming organs and certain disorders involving the immune mechanism: Secondary | ICD-10-CM

## 2024-04-25 DIAGNOSIS — K921 Melena: Secondary | ICD-10-CM

## 2024-04-25 DIAGNOSIS — R140 Abdominal distension (gaseous): Secondary | ICD-10-CM

## 2024-04-25 DIAGNOSIS — R1319 Other dysphagia: Secondary | ICD-10-CM

## 2024-04-25 DIAGNOSIS — K219 Gastro-esophageal reflux disease without esophagitis: Secondary | ICD-10-CM | POA: Diagnosis not present

## 2024-04-25 DIAGNOSIS — Z860101 Personal history of adenomatous and serrated colon polyps: Secondary | ICD-10-CM

## 2024-04-25 LAB — CBC WITH DIFFERENTIAL/PLATELET
Basophils Absolute: 0.1 10*3/uL (ref 0.0–0.1)
Basophils Relative: 1.5 % (ref 0.0–3.0)
Eosinophils Absolute: 1.6 10*3/uL — ABNORMAL HIGH (ref 0.0–0.7)
Eosinophils Relative: 19.1 % — ABNORMAL HIGH (ref 0.0–5.0)
HCT: 36.6 % — ABNORMAL LOW (ref 39.0–52.0)
Hemoglobin: 11.9 g/dL — ABNORMAL LOW (ref 13.0–17.0)
Lymphocytes Relative: 26.6 % (ref 12.0–46.0)
Lymphs Abs: 2.3 10*3/uL (ref 0.7–4.0)
MCHC: 32.6 g/dL (ref 30.0–36.0)
MCV: 70 fl — ABNORMAL LOW (ref 78.0–100.0)
Monocytes Absolute: 0.5 10*3/uL (ref 0.1–1.0)
Monocytes Relative: 5.8 % (ref 3.0–12.0)
Neutro Abs: 4 10*3/uL (ref 1.4–7.7)
Neutrophils Relative %: 47 % (ref 43.0–77.0)
Platelets: 352 10*3/uL (ref 150.0–400.0)
RBC: 5.22 Mil/uL (ref 4.22–5.81)
RDW: 15.4 % (ref 11.5–15.5)
WBC: 8.5 10*3/uL (ref 4.0–10.5)

## 2024-04-25 LAB — B12 AND FOLATE PANEL
Folate: >25.2 ng/mL (ref >5.9)
Vitamin B-12: 1231 pg/mL — ABNORMAL HIGH (ref 211–911)

## 2024-04-25 LAB — IRON,TIBC AND FERRITIN PANEL
%SAT: 30 % (ref 20–48)
Ferritin: 115 ng/mL (ref 24–380)
Iron: 79 ug/dL (ref 50–180)
TIBC: 264 ug/dL (ref 250–425)

## 2024-04-25 MED ORDER — SUCRALFATE 1 G PO TABS
1.0000 g | ORAL_TABLET | Freq: Three times a day (TID) | ORAL | 11 refills | Status: DC
  Filled 2024-04-25 – 2024-05-29 (×2): qty 90, 30d supply, fill #0

## 2024-04-25 MED ORDER — PANTOPRAZOLE SODIUM 40 MG PO TBEC
40.0000 mg | DELAYED_RELEASE_TABLET | Freq: Every day | ORAL | 3 refills | Status: DC
  Filled 2024-04-25: qty 90, 90d supply, fill #0

## 2024-04-25 NOTE — Progress Notes (Signed)
 Agree with assessment and plan as outlined.  If he has heme negative stool and no change in Hgb or iron studies would monitor. If stools heme positive and or iron deficiency, concerning for bleeding, agree next step is capsule

## 2024-04-25 NOTE — Patient Instructions (Signed)
 Your provider has requested that you go to the basement level for lab work before leaving today. Press "B" on the elevator. The lab is located at the first door on the left as you exit the elevator.  We have sent the following medications to your pharmacy for you to pick up at your convenience: Pantoprazole  40 mg daily and Sucralfate  1 g 3 times daily  Please follow up sooner if symptoms increase or worsen  Due to recent changes in healthcare laws, you may see the results of your imaging and laboratory studies on MyChart before your provider has had a chance to review them.  We understand that in some cases there may be results that are confusing or concerning to you. Not all laboratory results come back in the same time frame and the provider may be waiting for multiple results in order to interpret others.  Please give us  48 hours in order for your provider to thoroughly review all the results before contacting the office for clarification of your results.   _______________________________________________________  If your blood pressure at your visit was 140/90 or greater, please contact your primary care physician to follow up on this.  _______________________________________________________  If you are age 63 or older, your body mass index should be between 23-30. Your Body mass index is 25.79 kg/m. If this is out of the aforementioned range listed, please consider follow up with your Primary Care Provider.  If you are age 63 or younger, your body mass index should be between 19-25. Your Body mass index is 25.79 kg/m. If this is out of the aformentioned range listed, please consider follow up with your Primary Care Provider.   ________________________________________________________  The [facility] providers would like to encourage you to use MYCHART to communicate with providers for non-urgent requests or questions.  Due to long hold times on the telephone, sending your provider a message  by [facility] may be a faster and more efficient way to get a response.  Please allow 48 business hours for a response.  Please remember that this is for non-urgent requests.  _______________________________________________________ Thank you for trusting me with your gastrointestinal care!   Brigitte Canard, PA

## 2024-04-26 ENCOUNTER — Ambulatory Visit: Payer: Self-pay

## 2024-04-26 ENCOUNTER — Other Ambulatory Visit (HOSPITAL_COMMUNITY): Payer: Self-pay

## 2024-04-26 ENCOUNTER — Other Ambulatory Visit: Payer: Self-pay

## 2024-04-26 ENCOUNTER — Ambulatory Visit: Payer: Self-pay | Admitting: Physician Assistant

## 2024-04-26 NOTE — Telephone Encounter (Signed)
 Copied from CRM [id number]. Topic: Clinical - Red Word Triage >> Apr 26, 2024 10:02 AM Danelle Dunning F wrote: Red Word that prompted transfer to Nurse Triage:   Coughing for two weeks; a lot of mucous; Stomach feels very gassy.  Interpreter Loise Rinks [phone]  Chief Complaint: Cough Symptoms: Cough, clear sputum production, some difficulty breathing at night due to congestion  Frequency: Frequent  Pertinent Negatives: Patient denies fever Disposition: [] ED /[] Urgent Care (no appt availability in office) / [x] Appointment(In office/virtual)/ []  [facility]/ [] Home Care/ [] Refused Recommended Disposition /[] [facility]/ []  Follow-up with PCP Additional Notes: Patient reports he has had a cough for the last 3 week. He states that his cough is productive of clear sputum. He states he also has congestion that causes some mild difficulty breathing at night. Patient denies any fevers. Appointment scheduled for next week for evaluation.     Reason for Disposition  Cough has been present for > 3 weeks    Appointment next week per patient's request  Answer Assessment - Initial Assessment Questions 1. ONSET: "When did the cough begin?"      3 weeks ago  2. SEVERITY: "How bad is the cough today?"      Moderate  3. SPUTUM: "Describe the color of your sputum" (none, dry cough; clear, white, yellow, green)     Clear 4. HEMOPTYSIS: "Are you coughing up any blood?" If so ask: "How much?" (flecks, streaks, tablespoons, etc.)     No 5. DIFFICULTY BREATHING: "Are you having difficulty breathing?" If Yes, ask: "How bad is it?" (e.g., mild, moderate, severe)    - MILD: No SOB at rest, mild SOB with walking, speaks normally in sentences, can lie down, no retractions, pulse < 100.    - MODERATE: SOB at rest, SOB with minimal exertion and prefers to sit, cannot lie down flat, speaks in phrases, mild retractions, audible wheezing, pulse 100-120.    - SEVERE: Very SOB at rest, speaks in single words,  struggling to breathe, sitting hunched forward, retractions, pulse > 120      Mild difficulty breathing at night due to mucus  6. FEVER: "Do you have a fever?" If Yes, ask: "What is your temperature, how was it measured, and when did it start?"     No 7. CARDIAC HISTORY: "Do you have any history of heart disease?" (e.g., heart attack, congestive heart failure)      Hypertension  8. LUNG HISTORY: "Do you have any history of lung disease?"  (e.g., pulmonary embolus, asthma, emphysema)     Yes 9. PE RISK FACTORS: "Do you have a history of blood clots?" (or: recent major surgery, recent prolonged travel, bedridden)     No 10. OTHER SYMPTOMS: "Do you have any other symptoms?" (e.g., runny nose, wheezing, chest pain)       No  Protocols used: Cough - Acute Productive-A-AH

## 2024-04-26 NOTE — Telephone Encounter (Signed)
 Pt's appt is 5/20 with Dr Meredeth Stallion.

## 2024-05-02 ENCOUNTER — Other Ambulatory Visit: Payer: Self-pay

## 2024-05-02 ENCOUNTER — Other Ambulatory Visit (HOSPITAL_COMMUNITY): Payer: Self-pay

## 2024-05-02 ENCOUNTER — Ambulatory Visit (INDEPENDENT_AMBULATORY_CARE_PROVIDER_SITE_OTHER): Payer: Self-pay | Admitting: Internal Medicine

## 2024-05-02 ENCOUNTER — Encounter: Payer: Self-pay | Admitting: Internal Medicine

## 2024-05-02 VITALS — BP 143/89 | HR 69 | Temp 98.0°F | Ht 62.0 in | Wt 138.4 lb

## 2024-05-02 DIAGNOSIS — J45909 Unspecified asthma, uncomplicated: Secondary | ICD-10-CM | POA: Diagnosis not present

## 2024-05-02 DIAGNOSIS — J8410 Pulmonary fibrosis, unspecified: Secondary | ICD-10-CM

## 2024-05-02 DIAGNOSIS — I10 Essential (primary) hypertension: Secondary | ICD-10-CM

## 2024-05-02 DIAGNOSIS — J4540 Moderate persistent asthma, uncomplicated: Secondary | ICD-10-CM

## 2024-05-02 DIAGNOSIS — J069 Acute upper respiratory infection, unspecified: Secondary | ICD-10-CM

## 2024-05-02 DIAGNOSIS — J329 Chronic sinusitis, unspecified: Secondary | ICD-10-CM | POA: Diagnosis not present

## 2024-05-02 DIAGNOSIS — M5412 Radiculopathy, cervical region: Secondary | ICD-10-CM

## 2024-05-02 MED ORDER — GUAIFENESIN 200 MG PO TABS
400.0000 mg | ORAL_TABLET | Freq: Four times a day (QID) | ORAL | 0 refills | Status: DC | PRN
  Filled 2024-05-02: qty 30, 4d supply, fill #0

## 2024-05-02 MED ORDER — FLUTICASONE PROPIONATE 50 MCG/ACT NA SUSP
2.0000 | Freq: Every day | NASAL | 1 refills | Status: DC
  Filled 2024-05-02: qty 16, 30d supply, fill #0
  Filled 2024-06-08: qty 16, 30d supply, fill #1

## 2024-05-02 MED ORDER — GABAPENTIN 300 MG PO CAPS
900.0000 mg | ORAL_CAPSULE | Freq: Three times a day (TID) | ORAL | 11 refills | Status: AC | PRN
  Filled 2024-05-02 (×2): qty 270, 30d supply, fill #0

## 2024-05-02 MED ORDER — PREDNISONE 20 MG PO TABS
40.0000 mg | ORAL_TABLET | Freq: Every day | ORAL | 0 refills | Status: AC
Start: 2024-05-02 — End: 2024-05-06
  Filled 2024-05-02: qty 8, 4d supply, fill #0

## 2024-05-02 MED ORDER — CETIRIZINE HCL 10 MG PO TABS
10.0000 mg | ORAL_TABLET | Freq: Every day | ORAL | 3 refills | Status: DC
  Filled 2024-05-02: qty 90, 90d supply, fill #0
  Filled 2024-06-08: qty 90, 90d supply, fill #1
  Filled 2024-08-30: qty 90, 90d supply, fill #2

## 2024-05-02 MED ORDER — BUDESONIDE-FORMOTEROL FUMARATE 160-4.5 MCG/ACT IN AERO
2.0000 | INHALATION_SPRAY | Freq: Two times a day (BID) | RESPIRATORY_TRACT | 12 refills | Status: DC
  Filled 2024-05-02 (×2): qty 10.2, 30d supply, fill #0

## 2024-05-02 MED ORDER — GABAPENTIN 300 MG PO CAPS
900.0000 mg | ORAL_CAPSULE | Freq: Three times a day (TID) | ORAL | 11 refills | Status: DC | PRN
  Filled 2024-05-02: qty 270, 30d supply, fill #0

## 2024-05-02 NOTE — Progress Notes (Signed)
 CC: cough  HPI:  Eric Ingram is a 63 y.o. with medical history of HTN, GERD c/b gastritis, hx of melena, asthma, chronic sinusitis,  presenting to [facility] for coughing x3 weeks.   Please see problem-based list for further details, assessments, and plans.  Past Medical History:  Diagnosis Date   Anemia    CHF (congestive heart failure) (HCC)    Dental infection 08/02/2018   GERD (gastroesophageal reflux disease)    Hypertension    Oral thrush 04/22/2023   Shingles rash 07/07/2019   Thalassemia, alpha (HCC)    Viral gastritis 02/08/2019    Current Outpatient Medications (Endocrine & Metabolic):    predniSONE  (DELTASONE ) 20 MG tablet, Take 2 tablets (40 mg total) by mouth daily with breakfast for 4 days.  Current Outpatient Medications (Cardiovascular):    atorvastatin  (LIPITOR ) 80 MG tablet, Take 1 tablet (80 mg total) by mouth daily.   valsartan  (DIOVAN ) 160 MG tablet, Take 1 tablet (160 mg total) by mouth daily.  Current Outpatient Medications (Respiratory):    budesonide -formoterol  (SYMBICORT ) 160-4.5 MCG/ACT inhaler, Inhale 2 puffs into the lungs in the morning and at bedtime.   cetirizine (ZYRTEC ALLERGY) 10 MG tablet, Take 1 tablet (10 mg total) by mouth daily.   guaiFENesin  200 MG tablet, Take 2 tablets (400 mg total) by mouth every 6 (six) hours as needed for cough or to loosen phlegm.   albuterol  (VENTOLIN  HFA) 108 (90 Base) MCG/ACT inhaler, Inhale 2 puffs into the lungs every 6 (six) hours as needed for wheezing or shortness of breath.   fluticasone  (FLONASE ) 50 MCG/ACT nasal spray, Place 2 sprays into both nostrils daily.  Current Outpatient Medications (Analgesics):    acetaminophen  (TYLENOL ) 500 MG tablet, Take 1,000 mg by mouth every 6 (six) hours as needed for mild pain.   Current Outpatient Medications (Other):    DULoxetine  (CYMBALTA ) 60 MG capsule, Take 1 capsule (60 mg total) by mouth daily.   gabapentin  (NEURONTIN ) 300 MG capsule, Take 3 capsules (900 mg  total) by mouth 3 (three) times daily as needed.   pantoprazole  (PROTONIX ) 40 MG tablet, Take 1 tablet (40 mg total) by mouth daily.   polyethylene glycol (MIRALAX ) 17 g packet, Take 17 g (1 capful) by mouth daily.   senna (SENOKOT) 8.6 MG TABS tablet, Take 1 tablet (8.6 mg total) by mouth daily as needed for mild constipation.   sucralfate  (CARAFATE ) 1 g tablet, Take 1 tablet (1 g total) by mouth 3 (three) times daily before meals.  Review of Systems:  Review of system negative unless stated in the problem list or HPI.    Physical Exam:  Vitals:   05/02/24 0959 05/02/24 1116  BP: (!) 147/79 (!) 143/89  Pulse: 72 69  Temp: 98 F (36.7 C)   TempSrc: Oral   SpO2: 98%   Weight: 138 lb 6.4 oz (62.8 kg)   Height: 5\' 2"  (1.575 m)    Physical Exam General: NAD HENT: NCAT Lungs: diffuse wheezing in all lobes, good air movement Cardiovascular: Normal heart sounds, no r/m/g, 2+ pulses in all extremities. No LE edema Abdomen: No TTP, normal bowel sounds MSK: No asymmetry or muscle atrophy.  Skin: no lesions noted on exposed skin Neuro: Alert and oriented x4. CN grossly intact Psych: Normal mood and normal affect   Assessment & Plan:   Asthma Patient with asthma that is not controlled.  Patient is not adherent to his inhalers as he is not taking his Advair  but only using his rescue  inhaler.  Patient is currently having cough that is worse at nighttime.  Exam shows diffuse wheezing and patient reporting some dyspnea on exertion.  Patient appears to be having acute exacerbation of his asthma secondary to nonadherence with medications and likely an unknown trigger.  Plan to give him 4 days of prednisone  40 mg daily, start Symbicort  2 puffs twice daily, and use albuterol  as needed.  This cough has been a recurrent issue for the patient but appears patient has not filled his ICS/LABA inhaler since August 2024.  Will have patient follow-up in 4 weeks to ensure it has resolved.  Patient may be  having some component of postnasal drip as he does endorse some throat irritation and mucus, so we will start him on antihistamine.  Patient does not have any red flag symptoms including weight loss, fevers or chills, or hemoptysis.  If on follow-up patient has continued cough without any improvements we will evaluate further.  Chronic sinusitis Start patient on antihistamine, and advised to continue using Flonase  regularly.  He will benefit from [place] pot but I believe the language barrier will make it difficult for him to understand so we will focus on that at next visit.  Hypertension Patient has hypertension that appears to be well-controlled on valsartan  160 mg daily.  Patient appears to be in acute distress secondary to his asthma exacerbation so his blood pressure is likely elevated today for that reason.  Will defer changing his regimen as he has been previously controlled on this.   See Encounters Tab for problem based charting.  Patient Discussed with Dr. Blythe [place], MD Tommas Fragmin. [facility], PGY-3

## 2024-05-02 NOTE — Assessment & Plan Note (Signed)
 Start patient on antihistamine, and advised to continue using Flonase  regularly.  He will benefit from [place] pot but I believe the language barrier will make it difficult for him to understand so we will focus on that at next visit.

## 2024-05-02 NOTE — Assessment & Plan Note (Signed)
 Patient with asthma that is not controlled.  Patient is not adherent to his inhalers as he is not taking his Advair  but only using his rescue inhaler.  Patient is currently having cough that is worse at nighttime.  Exam shows diffuse wheezing and patient reporting some dyspnea on exertion.  Patient appears to be having acute exacerbation of his asthma secondary to nonadherence with medications and likely an unknown trigger.  Plan to give him 4 days of prednisone  40 mg daily, start Symbicort  2 puffs twice daily, and use albuterol  as needed.  This cough has been a recurrent issue for the patient but appears patient has not filled his ICS/LABA inhaler since August 2024.  Will have patient follow-up in 4 weeks to ensure it has resolved.  Patient may be having some component of postnasal drip as he does endorse some throat irritation and mucus, so we will start him on antihistamine.  Patient does not have any red flag symptoms including weight loss, fevers or chills, or hemoptysis.  If on follow-up patient has continued cough without any improvements we will evaluate further.

## 2024-05-02 NOTE — Assessment & Plan Note (Signed)
 Patient has hypertension that appears to be well-controlled on valsartan  160 mg daily.  Patient appears to be in acute distress secondary to his asthma exacerbation so his blood pressure is likely elevated today for that reason.  Will defer changing his regimen as he has been previously controlled on this.

## 2024-05-02 NOTE — Patient Instructions (Addendum)
 Eric Ingram, it was a pleasure seeing you today! You endorsed feeling well today. Below are some of the things we talked about this visit. We look forward to seeing you in the follow up appointment!  Today we discussed: For your cough, I am going to start you on a few medicines.   Take the prednisone  40 mg every day for 4 days.   Start using your symbicort  inhaler twice every day. Use the albuterol  as needed.   Start taking zyrtec every day and use the flonase  every day.   You can take guaifenesin  400 mg every 6 hours as needed.   Using the inhaler and zyrtec will help you the most with the cough.   I have ordered the following labs today:  Lab Orders  No laboratory test(s) ordered today      Referrals ordered today:   Referral Orders  No referral(s) requested today     I have ordered the following medication/changed the following medications:   Stop the following medications: Medications Discontinued During This Encounter  Medication Reason   gabapentin  (NEURONTIN ) 300 MG capsule Reorder   fluticasone  (FLONASE ) 50 MCG/ACT nasal spray Reorder   gabapentin  (NEURONTIN ) 300 MG capsule Reorder   fluticasone -salmeterol (ADVAIR  DISKUS) 500-50 MCG/ACT AEPB Discontinued by provider     Start the following medications: Meds ordered this encounter  Medications   DISCONTD: gabapentin  (NEURONTIN ) 300 MG capsule    Sig: Take 3 capsules (900 mg total) by mouth 3 (three) times daily as needed.    Dispense:  270 capsule    Refill:  11   predniSONE  (DELTASONE ) 20 MG tablet    Sig: Take 2 tablets (40 mg total) by mouth daily with breakfast for 4 days.    Dispense:  8 tablet    Refill:  0   fluticasone  (FLONASE ) 50 MCG/ACT nasal spray    Sig: Place 2 sprays into both nostrils daily.    Dispense:  16 g    Refill:  1   cetirizine (ZYRTEC ALLERGY) 10 MG tablet    Sig: Take 1 tablet (10 mg total) by mouth daily.    Dispense:  90 tablet    Refill:  3   gabapentin  (NEURONTIN ) 300  MG capsule    Sig: Take 3 capsules (900 mg total) by mouth 3 (three) times daily as needed.    Dispense:  270 capsule    Refill:  11   budesonide -formoterol  (SYMBICORT ) 160-4.5 MCG/ACT inhaler    Sig: Inhale 2 puffs into the lungs in the morning and at bedtime.    Dispense:  1 each    Refill:  12    Can substitute with ICS-LABA combo that is affordable.     Follow-up: 1 month follow up   Please make sure to arrive 15 minutes prior to your next appointment. If you arrive late, you may be asked to reschedule.   We look forward to seeing you next time. Please call our clinic at [phone] if you have any questions or concerns. The best time to call is Monday-Friday from 9am-4pm, but there is someone available 24/7. If after hours or the weekend, call the main hospital number and ask for the Internal Medicine Resident On-Call. If you need medication refills, please notify your pharmacy one week in advance and they will send us  a request.  Thank you for letting us  take part in your care. Wishing you the best!  Thank you, Jackolyn Masker, MD

## 2024-05-03 NOTE — Progress Notes (Signed)
 Internal Medicine Clinic Attending  Case discussed with the resident at the time of the visit.  We reviewed the resident's history and exam and pertinent patient test results.  I agree with the assessment, diagnosis, and plan of care documented in the resident's note.

## 2024-05-09 ENCOUNTER — Telehealth: Payer: Self-pay

## 2024-05-09 NOTE — Telephone Encounter (Signed)
 Eric Ingram in the lab called. They received Eric Ingram's hemoccult cards and he put the sticks in there so the cards need to be redone.

## 2024-05-22 ENCOUNTER — Other Ambulatory Visit: Payer: Self-pay

## 2024-05-22 ENCOUNTER — Other Ambulatory Visit (HOSPITAL_COMMUNITY): Payer: Self-pay

## 2024-05-22 ENCOUNTER — Ambulatory Visit (INDEPENDENT_AMBULATORY_CARE_PROVIDER_SITE_OTHER): Admitting: Student

## 2024-05-22 VITALS — BP 130/79 | HR 94 | Temp 98.1°F | Ht 62.0 in | Wt 138.8 lb

## 2024-05-22 DIAGNOSIS — J45901 Unspecified asthma with (acute) exacerbation: Secondary | ICD-10-CM | POA: Diagnosis not present

## 2024-05-22 DIAGNOSIS — K219 Gastro-esophageal reflux disease without esophagitis: Secondary | ICD-10-CM

## 2024-05-22 DIAGNOSIS — J4540 Moderate persistent asthma, uncomplicated: Secondary | ICD-10-CM

## 2024-05-22 DIAGNOSIS — J8410 Pulmonary fibrosis, unspecified: Secondary | ICD-10-CM

## 2024-05-22 DIAGNOSIS — I10 Essential (primary) hypertension: Secondary | ICD-10-CM | POA: Diagnosis not present

## 2024-05-22 DIAGNOSIS — K921 Melena: Secondary | ICD-10-CM

## 2024-05-22 MED ORDER — VALSARTAN 160 MG PO TABS
160.0000 mg | ORAL_TABLET | Freq: Every day | ORAL | 3 refills | Status: DC
  Filled 2024-05-22: qty 30, 30d supply, fill #0
  Filled 2024-06-23: qty 30, 30d supply, fill #1
  Filled 2024-07-24: qty 30, 30d supply, fill #2
  Filled 2024-08-24: qty 30, 30d supply, fill #3

## 2024-05-22 MED ORDER — ALBUTEROL SULFATE HFA 108 (90 BASE) MCG/ACT IN AERS
2.0000 | INHALATION_SPRAY | Freq: Four times a day (QID) | RESPIRATORY_TRACT | 2 refills | Status: DC | PRN
  Filled 2024-05-22: qty 6.7, 25d supply, fill #0
  Filled 2024-06-08 – 2024-06-12 (×2): qty 6.7, 25d supply, fill #1
  Filled 2024-07-24: qty 6.7, 25d supply, fill #2

## 2024-05-22 MED ORDER — GUAIFENESIN ER 600 MG PO TB12
600.0000 mg | ORAL_TABLET | Freq: Two times a day (BID) | ORAL | 2 refills | Status: DC
  Filled 2024-05-22: qty 60, 30d supply, fill #0

## 2024-05-22 MED ORDER — PANTOPRAZOLE SODIUM 40 MG PO TBEC
40.0000 mg | DELAYED_RELEASE_TABLET | Freq: Every day | ORAL | 3 refills | Status: DC
  Filled 2024-05-22: qty 90, 90d supply, fill #0

## 2024-05-22 MED ORDER — PANTOPRAZOLE SODIUM 40 MG PO TBEC
40.0000 mg | DELAYED_RELEASE_TABLET | Freq: Every day | ORAL | 3 refills | Status: DC
  Filled 2024-05-22: qty 90, 90d supply, fill #0
  Filled 2024-07-24 – 2024-08-15 (×2): qty 90, 90d supply, fill #1
  Filled 2024-09-18: qty 90, 90d supply, fill #2

## 2024-05-22 MED ORDER — ALBUTEROL SULFATE HFA 108 (90 BASE) MCG/ACT IN AERS
2.0000 | INHALATION_SPRAY | Freq: Four times a day (QID) | RESPIRATORY_TRACT | 2 refills | Status: DC | PRN
  Filled 2024-05-22: qty 6.7, 25d supply, fill #0

## 2024-05-22 MED ORDER — GUAIFENESIN 200 MG PO TABS
400.0000 mg | ORAL_TABLET | Freq: Four times a day (QID) | ORAL | 0 refills | Status: DC | PRN
  Filled 2024-05-22: qty 30, 4d supply, fill #0

## 2024-05-22 MED ORDER — VALSARTAN 160 MG PO TABS
160.0000 mg | ORAL_TABLET | Freq: Every day | ORAL | 3 refills | Status: DC
  Filled 2024-05-22: qty 30, 30d supply, fill #0

## 2024-05-22 NOTE — Progress Notes (Signed)
 Established Patient Office Visit  Subjective   Patient ID: Eric Ingram, male    DOB: 1961/02/14  Age: 63 y.o. MRN: [id number]  Chief Complaint  Patient presents with   Follow-up    Still having black stool, now followed by GI    chest congestion    C/o "mucus in throat" mostly at night     Patient is a 63 y.o. with a past medical history stated below who presents today for follow-up for melena/GI referral and chronic cough. He is accompanied by an in-person [employer] interpreter. He was last seen at [facility] for cough on 05/02/24.   Please see problem based assessment and plan for additional details.      Review of Systems  Constitutional:  Negative for fever.  Respiratory:  Positive for cough. Negative for hemoptysis and shortness of breath.   Gastrointestinal:  Negative for abdominal pain, blood in stool, nausea and vomiting.      Objective:     BP 130/79 (BP Location: Left Arm, Patient Position: Sitting, Cuff Size: Small)   Pulse 94   Temp 98.1 F (36.7 C) (Oral)   Ht 5\' 2"  (1.575 m)   Wt 138 lb 12.8 oz (63 kg)   SpO2 98%   BMI 25.39 kg/m     Physical Exam Constitutional:      Appearance: Normal appearance.  HENT:     Head: Normocephalic and atraumatic.  Cardiovascular:     Rate and Rhythm: Normal rate and regular rhythm.     Heart sounds: Normal heart sounds.  Pulmonary:     Effort: Pulmonary effort is normal.     Breath sounds: Normal breath sounds.  Abdominal:     General: Abdomen is flat. Bowel sounds are normal.     Palpations: Abdomen is soft.     Tenderness: There is no abdominal tenderness.  Skin:    General: Skin is warm and dry.  Neurological:     General: No focal deficit present.     Mental Status: He is alert.    Results for orders placed or performed in visit on 05/22/24  BMP8+Anion Gap  Result Value Ref Range   Glucose 93 70 - 99 mg/dL   BUN 7 (L) 8 - 27 mg/dL   Creatinine, Ser 8.29 0.76 - 1.27 mg/dL   eGFR 83 >56 OZ/HYQ/6.57    BUN/Creatinine Ratio 7 (L) 10 - 24   Sodium 141 134 - 144 mmol/L   Potassium 4.0 3.5 - 5.2 mmol/L   Chloride 102 96 - 106 mmol/L   CO2 21 20 - 29 mmol/L   Anion Gap 18.0 10.0 - 18.0 mmol/L   Calcium  9.5 8.6 - 10.2 mg/dL     The 84-ONGE ASCVD risk score (Arnett DK, et al., 2019) is: 13.8%    Assessment & Plan:   Problem List Items Addressed This Visit     Hypertension   Patient's BP 141/88 and 130/79 on repeat. Denies chest pain, headache, or heart palpitations. Cardiovascular exam unremarkable, no lower extremity edema observed. Takes valsartan  160 mg daily. Last metabolic panel from February 2024 unremarkable. Will repeat today.  Plan - Continue valsartan  160 mg daily - BMP today, follow up as needed      Relevant Medications   valsartan  (DIOVAN ) 160 MG tablet   Other Relevant Orders   BMP8+Anion Gap (Completed)   Melena   Patient referred to GI for history of melena, had GI appointment on 04/25/24 where his Hgb was stable, iron panel  was WNL, and his symptoms were stable. GI recommended possible capsule study in the future if needed, otherwise follow up in 6 months. Patient continues to deny any hematochezia, weakness, or lightheadedness. Stools are dark but no current episodes of melena. Discussed need to notify our office if bloody stools return and we can help him contact his GI doctor for additional workup.  Plan - Pantoprazole  40 mg daily, refill provided - Follow up with GI in 6 months, earlier if needed      Gastro-esophageal reflux   Chronic acid reflux and bloating. Abdominal exam soft, non-tender, and non-distended. Denied issues eating or drinking. Will continue pantoprazole  40 mg daily as written in GI note from 04/25/24 and carafate  1 g TID.  Plan - Refilled pantoprazole  40 mg daily  - Continue carafate  1 mg TID      Relevant Medications   pantoprazole  (PROTONIX ) 40 MG tablet   Asthma - Primary   Patient seen for probably asthma exacerbation secondary to  non adherence with medications on 5/20. He was prescribed 4 day course of prednisone  40 mg daily as well as Symbicort  2 puffs BID in addition to his albuterol  rescue inhaler. Patient endorsed taking the prednisone  but stated he never picked up the inhaler as it was too expensive. Today he complains of productive cough and dyspnea, both of which have been chronic complaints for some time and for which he has been seen many times in the office. Discussed what is most bothersome about his cough and it seems to be that he feels like there is a lot of mucus inside that he can't cough up. Coughing seems to be more active at night. Denied fever, chills, hemoptysis, or sick contacts. He is not in acute distress today, he is afebrile, and he is saturating 98% on room air. Respiratory effort was normal and there was good airflow on auscultation of lungs. Minimal to no wheezing noted today. Reviewed the importance of starting Symbicort  to help control symptoms. Will reach out to [facility] for help with cost of inhaler. Patient also agreeable to starting Mucinex  to help clear up some of the mucus, will use during the day in addition to the antihistamine and albuterol  inhaler as needed.  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 Patient with history of chronic dyspnea and productive cough. Does not appear in acute distress today. Symptoms seem about the same on albuterol  inhaler PRN. Has been prescribed Advair  in the past,  does not use a daily maintenance inhaler currently.  Plan - Continue albuterol  inhaler PRN, refill sent to pharmacy      Relevant Medications   albuterol  (VENTOLIN  HFA) 108 (90 Base) MCG/ACT inhaler   Patient discussed with Dr. Jarvis Mesa.  Return in about 4 weeks (around 06/19/2024) for asthma/chronic cough.   Myalee Stengel Michelina Aho, MD PGY-1, [facility]

## 2024-05-22 NOTE — Patient Instructions (Signed)
 Thank you, [name] for allowing us  to provide your care today. .    I have ordered the following labs for you:  Lab Orders         BMP8+Anion Gap       Tests ordered today:  None  Referrals ordered today:   Referral Orders  No referral(s) requested today     I have ordered the following medication/changed the following medications:   Stop the following medications: Medications Discontinued During This Encounter  Medication Reason   valsartan  (DIOVAN ) 160 MG tablet Reorder   albuterol  (VENTOLIN  HFA) 108 (90 Base) MCG/ACT inhaler Reorder   pantoprazole  (PROTONIX ) 40 MG tablet Reorder   guaiFENesin  200 MG tablet Reorder   albuterol  (VENTOLIN  HFA) 108 (90 Base) MCG/ACT inhaler Reorder   valsartan  (DIOVAN ) 160 MG tablet Reorder   pantoprazole  (PROTONIX ) 40 MG tablet Reorder   guaiFENesin  (MUCINEX ) 600 MG 12 hr tablet Reorder     Start the following medications: Meds ordered this encounter  Medications   DISCONTD: albuterol  (VENTOLIN  HFA) 108 (90 Base) MCG/ACT inhaler    Sig: Inhale 2 puffs into the lungs every 6 (six) hours as needed for wheezing or shortness of breath.    Dispense:  6.7 g    Refill:  2   DISCONTD: valsartan  (DIOVAN ) 160 MG tablet    Sig: Take 1 tablet (160 mg total) by mouth daily.    Dispense:  30 tablet    Refill:  3   DISCONTD: pantoprazole  (PROTONIX ) 40 MG tablet    Sig: Take 1 tablet (40 mg total) by mouth daily.    Dispense:  90 tablet    Refill:  3    IM program   DISCONTD: guaiFENesin  (MUCINEX ) 600 MG 12 hr tablet    Sig: Take 1 tablet (600 mg total) by mouth 2 (two) times daily.    Dispense:  60 tablet    Refill:  2   guaiFENesin  200 MG tablet    Sig: Take 2 tablets (400 mg total) by mouth every 6 (six) hours as needed for cough or to loosen phlegm.    Dispense:  30 tablet    Refill:  0   guaiFENesin  (MUCINEX ) 600 MG 12 hr tablet    Sig: Take 1 tablet (600 mg total) by mouth 2 (two) times daily.    Dispense:  60 tablet    Refill:   2   albuterol  (VENTOLIN  HFA) 108 (90 Base) MCG/ACT inhaler    Sig: Inhale 2 puffs into the lungs every 6 (six) hours as needed for wheezing or shortness of breath.    Dispense:  6.7 g    Refill:  2   valsartan  (DIOVAN ) 160 MG tablet    Sig: Take 1 tablet (160 mg total) by mouth daily.    Dispense:  30 tablet    Refill:  3   pantoprazole  (PROTONIX ) 40 MG tablet    Sig: Take 1 tablet (40 mg total) by mouth daily.    Dispense:  90 tablet    Refill:  3    IM program     Follow up: 1 month for asthma/chronic cough    Remember:   - If you have any blood in your stool please call our clinic to make an appointment.   - I will call you with your lab results.   - Your refills have been sent to the pharmacy. Mucinex  is a medication to help you cough out phlegm. You can take 1-2 tablets  a day as needed.   - I will reach out to our pharmacist Aron Lard to see if she can find a more affordable option for the Symbicort  inhaler.   Should you have any questions or concerns please call the internal medicine clinic at [phone].     Adonus Uselman Arellano Zameza, MD PGY-1 Internal Medicine Teaching Progam [facility]

## 2024-05-23 ENCOUNTER — Other Ambulatory Visit (HOSPITAL_COMMUNITY): Payer: Self-pay

## 2024-05-23 ENCOUNTER — Other Ambulatory Visit: Payer: Self-pay

## 2024-05-23 LAB — BMP8+ANION GAP
Anion Gap: 18 mmol/L (ref 10.0–18.0)
BUN/Creatinine Ratio: 7 — ABNORMAL LOW (ref 10–24)
BUN: 7 mg/dL — ABNORMAL LOW (ref 8–27)
CO2: 21 mmol/L (ref 20–29)
Calcium: 9.5 mg/dL (ref 8.6–10.2)
Chloride: 102 mmol/L (ref 96–106)
Creatinine, Ser: 1.02 mg/dL (ref 0.76–1.27)
Glucose: 93 mg/dL (ref 70–99)
Potassium: 4 mmol/L (ref 3.5–5.2)
Sodium: 141 mmol/L (ref 134–144)
eGFR: 83 mL/min/{1.73_m2} (ref >59)

## 2024-05-23 NOTE — Assessment & Plan Note (Signed)
 Patient's BP 141/88 and 130/79 on repeat. Denies chest pain, headache, or heart palpitations. Cardiovascular exam unremarkable, no lower extremity edema observed. Takes valsartan  160 mg daily. Last metabolic panel from February 2024 unremarkable. Will repeat today.  Plan - Continue valsartan  160 mg daily - BMP today, follow up as needed

## 2024-05-23 NOTE — Addendum Note (Signed)
 Addended by: Dylin Ihnen L on: 05/23/2024 02:44 PM   Modules accepted: Level of Service

## 2024-05-23 NOTE — Assessment & Plan Note (Signed)
 Please see asthma section for additional details. Patient with history of chronic dyspnea and productive cough. Does not appear in acute distress today. Symptoms seem about the same on albuterol  inhaler PRN. Has been prescribed Advair  in the past, does not use a daily maintenance inhaler currently.  Plan - Continue albuterol  inhaler PRN, refill sent to pharmacy

## 2024-05-23 NOTE — Assessment & Plan Note (Addendum)
 Chronic acid reflux and bloating. Abdominal exam soft, non-tender, and non-distended. Denied issues eating or drinking. Will continue pantoprazole  40 mg daily as written in GI note from 04/25/24 and carafate  1 g TID.  Plan - Refilled pantoprazole  40 mg daily  - Continue carafate  1 mg TID

## 2024-05-23 NOTE — Addendum Note (Signed)
 Addended by: Devora Tortorella L on: 05/23/2024 03:10 PM   Modules accepted: Level of Service

## 2024-05-23 NOTE — Progress Notes (Signed)
 Internal Medicine Clinic Attending  Case discussed with the resident at the time of the visit.  We reviewed the resident's history and exam and pertinent patient test results.  I agree with the assessment, diagnosis, and plan of care documented in the resident's note.

## 2024-05-23 NOTE — Assessment & Plan Note (Signed)
 Patient referred to GI for history of melena, had GI appointment on 04/25/24 where his Hgb was stable, iron panel was WNL, and his symptoms were stable. GI recommended possible capsule study in the future if needed, otherwise follow up in 6 months. Patient continues to deny any hematochezia, weakness, or lightheadedness. Stools are dark but no current episodes of melena. Discussed need to notify our office if bloody stools return and we can help him contact his GI doctor for additional workup.  Plan - Pantoprazole  40 mg daily, refill provided - Follow up with GI in 6 months, earlier if needed

## 2024-05-23 NOTE — Assessment & Plan Note (Signed)
 Patient seen for probably asthma exacerbation secondary to non adherence with medications on 5/20. He was prescribed 4 day course of prednisone  40 mg daily as well as Symbicort  2 puffs BID in addition to his albuterol  rescue inhaler. Patient endorsed taking the prednisone  but stated he never picked up the inhaler as it was too expensive. Today he complains of productive cough and dyspnea, both of which have been chronic complaints for some time and for which he has been seen many times in the office. Discussed what is most bothersome about his cough and it seems to be that he feels like there is a lot of mucus inside that he can't cough up. Coughing seems to be more active at night. Denied fever, chills, hemoptysis, or sick contacts. He is not in acute distress today, he is afebrile, and he is saturating 98% on room air. Respiratory effort was normal and there was good airflow on auscultation of lungs. Minimal to no wheezing noted today. Reviewed the importance of starting Symbicort  to help control symptoms. Will reach out to [facility] for help with cost of inhaler. Patient also agreeable to starting Mucinex  to help clear up some of the mucus, will use during the day in addition to the antihistamine and albuterol  inhaler as needed.  Plan - Aron Lard to help determine if Symbicort  or cheaper option is available  - Patient to START Mucinex  600 mg BID as needed - Albuterol  inhaler refilled today  - Continue daily antihistamine  - Follow up in clinic in 1 month to ensure he has an ICS-LABA inhaler and his symptoms are better controlled (patient not easily available on the phone)

## 2024-05-29 ENCOUNTER — Other Ambulatory Visit: Payer: Self-pay

## 2024-05-29 ENCOUNTER — Encounter: Payer: Self-pay | Admitting: *Deleted

## 2024-05-31 ENCOUNTER — Other Ambulatory Visit: Payer: Self-pay | Admitting: Student

## 2024-05-31 ENCOUNTER — Ambulatory Visit: Payer: Self-pay | Admitting: Student

## 2024-05-31 ENCOUNTER — Telehealth: Payer: Self-pay

## 2024-05-31 NOTE — Telephone Encounter (Signed)
 Copied from CRM [id number]. Topic: General - Other >> May 30, 2024 11:38 AM Blair Bumpers wrote: Reason for CRM: Patient called in requesting to speak with Mae. Advised him that she's on the floor today. He would like for her to give him a call back. States she had called his daughter & he's not really sure what the call was for. CB #: [phone].

## 2024-05-31 NOTE — Telephone Encounter (Signed)
 I called pt back, no answer VM is not setup.

## 2024-05-31 NOTE — Progress Notes (Signed)
 Tried calling patient today with [facility] interpreter however no interpreter available to help make the call. Patient needs to make appointment for 1 month follow up to his 05/22/24 visit to [facility] where we discussed his moderate persistent asthma and difficulties obtaining a maintenance inhaler. Was previously prescribed Symbicort  but that was cost prohibitive. Is currently only using an albuterol  rescue inhaler as needed. Messaged Aron Lard from pharmacy who was able to run some test claims. Patient has a high medicare deductible. The following are options. Next physician who sees him at [facility] can let patient know he can also call his insurance to request a payment plan.   Brand Symbicort  (DAW1) $235   Generic Symbicort  not covered   Brand Breo $393   Brand Dulera  $344   Generic Advair  Diskus $82   The above prices are estimates and may change. They can serve as a reference point when discussing options for a maintenance inhaler with the patient.   Keerstin Bjelland Michelina Aho, MD PGY-1, [facility]

## 2024-06-08 ENCOUNTER — Other Ambulatory Visit: Payer: Self-pay

## 2024-06-08 ENCOUNTER — Ambulatory Visit: Admitting: Student

## 2024-06-08 VITALS — BP 150/93 | HR 70 | Temp 98.3°F | Ht 62.0 in | Wt 141.6 lb

## 2024-06-08 DIAGNOSIS — J4540 Moderate persistent asthma, uncomplicated: Secondary | ICD-10-CM

## 2024-06-08 DIAGNOSIS — J45909 Unspecified asthma, uncomplicated: Secondary | ICD-10-CM | POA: Diagnosis not present

## 2024-06-08 DIAGNOSIS — R140 Abdominal distension (gaseous): Secondary | ICD-10-CM

## 2024-06-08 DIAGNOSIS — R1319 Other dysphagia: Secondary | ICD-10-CM

## 2024-06-08 DIAGNOSIS — K921 Melena: Secondary | ICD-10-CM

## 2024-06-08 DIAGNOSIS — M5412 Radiculopathy, cervical region: Secondary | ICD-10-CM

## 2024-06-08 MED ORDER — DULOXETINE HCL 60 MG PO CPEP
60.0000 mg | ORAL_CAPSULE | Freq: Every day | ORAL | 11 refills | Status: AC
  Filled 2024-06-08: qty 30, 30d supply, fill #0
  Filled 2024-12-19: qty 30, 30d supply, fill #1

## 2024-06-08 MED ORDER — SUCRALFATE 1 G PO TABS
1.0000 g | ORAL_TABLET | Freq: Three times a day (TID) | ORAL | 11 refills | Status: DC
  Filled 2024-06-08 – 2024-06-30 (×2): qty 120, 30d supply, fill #0
  Filled 2024-08-15: qty 120, 30d supply, fill #1

## 2024-06-08 NOTE — Progress Notes (Signed)
   CC: Acute Concern of cough, dyspnea, and wheezing  HPI:  Eric Ingram is a 63 y.o. male with pertinent PMH of HTN, asthma, postinflammatory pulmonary fibrosis, esophageal dysphagia, GERD, and hyperlipidemia who presents as above. Please see assessment and plan below for further details.  Medications: Current Outpatient Medications  Medication Instructions   acetaminophen  (TYLENOL ) 1,000 mg, Every 6 hours PRN   albuterol  (VENTOLIN  HFA) 108 (90 Base) MCG/ACT inhaler 2 puffs, Inhalation, Every 6 hours PRN   atorvastatin  (LIPITOR ) 80 mg, Oral, Daily   budesonide -formoterol  (SYMBICORT ) 160-4.5 MCG/ACT inhaler 2 puffs, Inhalation, 2 times daily   cetirizine  (ZYRTEC  ALLERGY) 10 mg, Oral, Daily   DULoxetine  (CYMBALTA ) 60 mg, Oral, Daily   fluticasone  (FLONASE ) 50 MCG/ACT nasal spray 2 sprays, Each Nare, Daily   gabapentin  (NEURONTIN ) 900 mg, Oral, 3 times daily PRN   guaiFENesin  (MUCINEX ) 600 mg, Oral, 2 times daily   guaiFENesin  400 mg, Oral, Every 6 hours PRN   pantoprazole  (PROTONIX ) 40 mg, Oral, Daily   polyethylene glycol (MIRALAX ) 17 g packet Take 17 g (1 capful) by mouth daily.   senna (SENOKOT) 8.6 mg, Oral, Daily PRN   sucralfate  (CARAFATE ) 1 g, Oral, 3 times daily with meals & bedtime   valsartan  (DIOVAN ) 160 mg, Oral, Daily     Review of Systems:   Pertinent items noted in HPI and/or A&P.  Physical Exam:  Vitals:   06/08/24 1459 06/08/24 1524 06/08/24 1536  BP: (!) 163/93 (!) 156/94 (!) 150/93  Pulse: 81 76 70  Temp: 98.3 F (36.8 C)    TempSrc: Oral    SpO2: 95%    Weight: 141 lb 9.6 oz (64.2 kg)    Height: 5' 2 (1.575 m)      Constitutional: Well-appearing adult male. In no acute distress. HEENT: Normocephalic, atraumatic, Sclera non-icteric, PERRL, EOM intact Cardio:Regular rate and rhythm. 2+ bilateral radial pulses. Pulm: Diffuse expiratory wheezing with good air movement throughout lung fields.  Normal work of breathing on room air. FDX:Wzhjupcz for  extremity edema. Skin:Warm and dry.  Assessment & Plan:   Asthma Patient comes in with 1 month of productive cough with yellow/white sputum, persistent wheezing and stable dyspnea that is worse at night.  After review he is not taking his LABA/ICS inhaler consistently.  Symptoms are responsive to albuterol  and his LABA/ICS inhaler but without using maintenance inhaler correctly he has return of his symptoms.  On exam he does have significant wheezing throughout lung fields but is moving air in all lung fields.  Speaking in full sentences without issue.  We reviewed proper technique of using his Spiriva inhaler and proper dosing.  He will start using this correctly and if his symptoms do not improve by next week he is instructed to call the clinic for another follow-up.    Patient discussed with Dr. Mliss Foot  Fairy Pool, DO [facility] Internal Medicine Resident PGY-2 Clinic Phone: [phone] Please contact the on call pager at [phone] for any urgent or emergent needs.

## 2024-06-09 NOTE — Assessment & Plan Note (Signed)
 Patient comes in with 1 month of productive cough with yellow/white sputum, persistent wheezing and stable dyspnea that is worse at night.  After review he is not taking his LABA/ICS inhaler consistently.  Symptoms are responsive to albuterol  and his LABA/ICS inhaler but without using maintenance inhaler correctly he has return of his symptoms.  On exam he does have significant wheezing throughout lung fields but is moving air in all lung fields.  Speaking in full sentences without issue.  We reviewed proper technique of using his Spiriva inhaler and proper dosing.  He will start using this correctly and if his symptoms do not improve by next week he is instructed to call the clinic for another follow-up.

## 2024-06-09 NOTE — Progress Notes (Signed)
 Internal Medicine Clinic Attending  Case discussed with the resident at the time of the visit.  We reviewed the resident's history and exam and pertinent patient test results.  I agree with the assessment, diagnosis, and plan of care documented in the resident's note.

## 2024-06-13 ENCOUNTER — Other Ambulatory Visit: Payer: Self-pay

## 2024-06-23 ENCOUNTER — Other Ambulatory Visit: Payer: Self-pay

## 2024-06-26 ENCOUNTER — Ambulatory Visit (INDEPENDENT_AMBULATORY_CARE_PROVIDER_SITE_OTHER)

## 2024-06-26 ENCOUNTER — Other Ambulatory Visit: Payer: Self-pay

## 2024-06-26 VITALS — BP 132/85 | HR 88 | Temp 98.4°F | Ht 62.0 in | Wt 141.0 lb

## 2024-06-26 DIAGNOSIS — K219 Gastro-esophageal reflux disease without esophagitis: Secondary | ICD-10-CM | POA: Diagnosis not present

## 2024-06-26 DIAGNOSIS — J4540 Moderate persistent asthma, uncomplicated: Secondary | ICD-10-CM

## 2024-06-26 NOTE — Assessment & Plan Note (Addendum)
 Patient returns with the same symptoms of productive cough (yellow sputum), dyspnea worse at night, wheezing. Recently saw Dr. Jolaine a few weeks ago but the patient is unable to explain what he discussed at the last visit. After clarification with the interpreter, the patient is using Albuterol  inhaler twice a day but is not using his LABA/ICS inhaler consistently. Review of past notes reveals that the patient may not be taking another inhaler due to financial concerns. I counseled the patient at length to take his maintenance Symbicort  everyday and Albuterol  inhaler only as needed using the teach-back method. Low suspicion for acute infectious process given lack of fever. On exam the patient is not in respiratory distress but does have diffuse wheezing and rhonchi which is consistent with moderate persistent asthma in the setting of medication non-adherence. Only coughing upon deep inspiration. Denies any active chest pain or fever. Patient requesting refill of Albuterol  inhaler, but he still has refills at pharmacy. No changes to his regimen at this time. With interpreter help, I instructed the patient to return to the clinic this week to show the nursing staff which inhalers he currently has so that any necessary changes to his medication regimen can be made.  -Plan:  -continue Albuterol  inhaler as needed with maintenance LABA/ICS (Symbicort ) -will reassess after seeing which inhalers the patient has

## 2024-06-26 NOTE — Progress Notes (Signed)
 Patient name: Eric Ingram Date of birth: 28-Jun-1961 Date of visit: 06/26/24  Type of visit: Established Patient Office Visit   Visit was conducted with the help of in person [place] interpreter  Subjective   Chief concern:  Chief Complaint  Patient presents with   Cough   Bloated    Eric Ingram is a 63 y.o. male with a PMHx of asthma, htn, postinflammatory pulmonary fibrosis, esophageal dysphagia, GERD, and HLD who presents to [facility] clinic for continued cough and bloating. Patient is also requesting medication refills.  Previously seen by Dr. Jolaine on 06/08/24 reviewed proper use of the Symbicort  inhaler. Does not recall what the doctor said at this visit.  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 [facility])    Chronic sinusitis 01/05/2018   Health care maintenance 01/05/2018   Hypertension 10/16/2017     Past Surgical History:  Procedure Laterality Date   ANTERIOR CERVICAL DECOMP/DISCECTOMY FUSION N/A 12/19/2021   Procedure: Anterior Cervical Discectomy and Fusion Cervical Four-Five-Cervical Five-Six;  Surgeon: Onetha Kuba, MD;  Location: [facility] OR;  Service: Neurosurgery;  Laterality: N/A;  3C   COLONOSCOPY  2019   COLONOSCOPY WITH ESOPHAGOGASTRODUODENOSCOPY (EGD)  03/01/2023   ROS Please see pertinent positives and negatives in Assessment and Plan   Current Outpatient Medications  Medication Instructions   acetaminophen  (TYLENOL ) 1,000 mg,  Every 6 hours PRN   albuterol  (VENTOLIN  HFA) 108 (90 Base) MCG/ACT inhaler 2 puffs, Inhalation, Every 6 hours PRN   atorvastatin  (LIPITOR ) 80 mg, Oral, Daily   budesonide -formoterol  (SYMBICORT ) 160-4.5 MCG/ACT inhaler 2 puffs, Inhalation, 2 times daily   cetirizine  (ZYRTEC  ALLERGY) 10 mg, Oral, Daily   DULoxetine  (CYMBALTA ) 60 mg, Oral, Daily   fluticasone  (FLONASE ) 50 MCG/ACT nasal spray 2 sprays, Each Nare, Daily   gabapentin  (NEURONTIN ) 900 mg, Oral, 3 times daily PRN   guaiFENesin  (MUCINEX ) 600 mg, Oral, 2 times daily   guaiFENesin  400 mg, Oral, Every 6 hours PRN   pantoprazole  (PROTONIX ) 40 mg, Oral, Daily   polyethylene glycol (MIRALAX ) 17 g packet Take 17 g (1 capful) by mouth daily.   senna (SENOKOT) 8.6 mg, Oral, Daily PRN   sucralfate  (CARAFATE ) 1 g, Oral, 3 times daily with meals & bedtime   valsartan  (DIOVAN ) 160 mg, Oral, Daily    Social History   Tobacco Use   Smoking status: Former    Current packs/day: 0.00    Average packs/day: 1 pack/day for 5.0 years (5.0 ttl pk-yrs)    Types: Cigarettes    Start date: 12/14/1994    Quit date: 12/15/1999    Years since quitting: 24.5   Smokeless tobacco: Never  Vaping Use   Vaping status: Never Used  Substance Use Topics   Alcohol use: Not Currently    Comment: former   Drug use: Never      Objective  Today's Vitals   06/26/24 1446  BP: 132/85  Pulse: 88  Temp: 98.4 F (36.9 C)  TempSrc: Oral  SpO2: 95%  Weight: 141 lb (64 kg)  Height:  5' 2 (1.575 m)  PainSc: 0-No pain  Body mass index is 25.79 kg/m.   Physical Exam Constitutional:      General: He is not in acute distress.    Appearance: Normal appearance. He is normal weight. He is not ill-appearing or diaphoretic.  HENT:     Head: Normocephalic and atraumatic.     Mouth/Throat:     Mouth: Mucous membranes are moist.     Pharynx: Oropharynx is clear. No oropharyngeal exudate or posterior oropharyngeal erythema.  Eyes:     General: No scleral icterus.     Extraocular Movements: Extraocular movements intact.     Conjunctiva/sclera: Conjunctivae normal.     Pupils: Pupils are equal, round, and reactive to light.  Pulmonary:     Effort: Pulmonary effort is normal. No respiratory distress.     Breath sounds: Wheezing (diffuse) and rhonchi (diffuse) present. No rales.  Chest:     Chest wall: No tenderness.  Abdominal:     General: Abdomen is flat. Bowel sounds are normal. There is no distension.     Palpations: Abdomen is soft. There is no mass.     Tenderness: There is abdominal tenderness (midline). There is no right CVA tenderness, left CVA tenderness, guarding or rebound.     Hernia: No hernia is present.  Skin:    Coloration: Skin is not jaundiced or pale.     Findings: No bruising, erythema or rash.  Neurological:     Mental Status: He is alert and oriented to person, place, and time. Mental status is at baseline.     Gait: Gait normal.  Psychiatric:        Mood and Affect: Mood normal.        Behavior: Behavior normal.         Assessment & Plan  Problem List Items Addressed This Visit       Respiratory   Asthma - Primary   Patient returns with the same symptoms of productive cough (yellow sputum), dyspnea worse at night, wheezing. Recently saw Dr. Jolaine a few weeks ago but the patient is unable to explain what he discussed at the last visit. After clarification with the interpreter, the patient is using Albuterol  inhaler twice a day but is not using his LABA/ICS inhaler consistently. Review of past notes reveals that the patient may not be taking another inhaler due to financial concerns. I counseled the patient at length to take his maintenance Symbicort  everyday and Albuterol  inhaler only as needed using the teach-back method. Low suspicion for acute infectious process given lack of fever. On exam the patient is not in respiratory distress but does have diffuse wheezing and rhonchi which is consistent with moderate persistent  asthma in the setting of medication non-adherence. Only coughing upon deep inspiration. Denies any active chest pain or fever. Patient requesting refill of Albuterol  inhaler, but he still has refills at pharmacy. No changes to his regimen at this time. With interpreter help, I instructed the patient to return to the clinic this week to show the nursing staff which inhalers he currently has so that any necessary changes to his medication regimen can be made.  -Plan:  -continue Albuterol  inhaler as needed with maintenance LABA/ICS (Symbicort ) -will reassess after seeing which inhalers the patient has        Digestive   Gastro-esophageal reflux   Chronic acid reflux and bloating symptoms. Patient is not taking his prescribed Carafate . Follows with GI. Recent EGD with biopsies negative for H.  Pylori. Upon review of medications, the patient never picked up the Carafate  from the pharmacy. Counseled on taking this medication as his symptoms may improve. -Continue pantoprazole  40mg  daily -Continue Carafate  1mg  tid.      Return in about 3 months (around 09/26/2024) for follow up of asthma.  Patient discussed with Dr. Karna, who also saw and evaluated the patient.  Ziyanna Tolin, MD [facility]  PGY-1 06/26/2024, 4:52 PM

## 2024-06-26 NOTE — Patient Instructions (Addendum)
 C?m ?n ng Jowan Barz ? cho php chng ti ch?m [place] ng hm nay. Hm nay chng ti ? th?o lu?n v? nh?ng v?n ?? sau:  - Ho c?a ng c th? l do hen suy?n. Vui lng s? d?ng ?ng ht Symbicort  m?i ngy - 2 nht x?t, 2 l?n/ngy. - S? d?ng ?ng ht Albuterol  khi c?n thi?t n?u ng b? ho, kh th? ho?c th? kh kh. - Vui lng u?ng sucralfat (carafate ) 3 l?n/ngy. - Thu?c c?a ng hi?n ? c t?i Nh thu?c [place].  Ti ? ??t cc xt nghi?m sau cho ng:  Ch? ??nh xt nghi?m Khng c xt nghi?m no ???c ??t hm nay  Cc xt nghi?m ???c ??t hm nay:  Cc ch? ??nh gi?i thi?u hm nay:  Ch? ??nh gi?i thi?u Khng c ch? ??nh gi?i thi?u no ???c ??t hm nay  Ti ? ??t cc lo?i thu?c sau/? thay ??i cc lo?i thu?c sau:  Ng?ng cc lo?i thu?c sau:  Khng c thu?c no b? ng?ng s? d?ng.  B?t ??u dng cc lo?i thu?c sau:  Khng c ch? ??nh no thu?c lo?i ? xc ??nh ???c ??t trong l?n khm ny.  Theo di: n?u c?n thi?t n?u cc tri?u ch?ng khng c?i thi?n  L?u : VUI LNG QUAY L?I PHNG KHM MANG THEO D?NG BNH X?T V CHO Y T XEM  N?u b?n c b?t k? cu h?i ho?c th?c m?c no, vui lng g?i Phng khm N?i khoa theo s? [phone].  Bc s? Letha Waymond Faith tm N?i [facility]

## 2024-06-26 NOTE — Assessment & Plan Note (Signed)
 Chronic acid reflux and bloating symptoms. Patient is not taking his prescribed Carafate . Follows with GI. Recent EGD with biopsies negative for H. Pylori. Upon review of medications, the patient never picked up the Carafate  from the pharmacy. Counseled on taking this medication as his symptoms may improve. -Continue pantoprazole  40mg  daily -Continue Carafate  1mg  tid.

## 2024-06-28 ENCOUNTER — Other Ambulatory Visit: Payer: Self-pay

## 2024-06-28 NOTE — Addendum Note (Signed)
 Addended by: KARNA FELLOWS on: 06/28/2024 09:32 AM   Modules accepted: Level of Service

## 2024-06-28 NOTE — Progress Notes (Signed)
 Internal Medicine Clinic Attending  I was physically present during the key portions of the resident provided service and participated in the medical decision making of patient's management care. I reviewed pertinent patient test results.  The assessment, diagnosis, and plan were formulated together and I agree with the documentation in the resident's note.  Patient using albuterol  prn only for asthma symptoms, although states he does have another inhaler at home. We discussed the importance of maintenance therapy as I believe this will significantly help his symptoms. Appears to have been issues with financial coverage of ICS/LABA therapy per previous notes and I do not see a recent fill of these medications. From previous discussions with pharmacy (05/31/24), coverage limited by high deductible. Most affordable option may be generic Advair  (fluticasone /salmeterol) for maintenance--would need to continue using albuterol  prn for symptom relief. Dr. Tan to f/u later this week regarding additional inhaler.  Karna Fellows, MD

## 2024-06-30 ENCOUNTER — Other Ambulatory Visit: Payer: Self-pay

## 2024-06-30 ENCOUNTER — Other Ambulatory Visit: Payer: Self-pay | Admitting: Student

## 2024-06-30 MED ORDER — FLUTICASONE-SALMETEROL 100-50 MCG/ACT IN AEPB
1.0000 | INHALATION_SPRAY | Freq: Two times a day (BID) | RESPIRATORY_TRACT | 3 refills | Status: AC
  Filled 2024-06-30: qty 60, 30d supply, fill #0
  Filled 2024-12-19: qty 60, 30d supply, fill #1

## 2024-06-30 NOTE — Progress Notes (Signed)
 Called the patient to clarify what inhalers the patient currently has.  Patient states that he only has albuterol  inhaler at home.  He was previously prescribed Symbicort  but was unable to afford it even with his insurance.  For maintenance therapy of his asthma, I have prescribed a generic Advair  inhaler that the patient states he will be able to afford.  Instructed the patient to use this inhaler 2 times per day and the albuterol  inhaler as needed.  The patient will also be coming into the clinic to show nursing staff which medications he currently has.  Eric Colt, MD [facility] Internal Medicine  PGY-1  06/30/24, 9:33 AM

## 2024-07-24 ENCOUNTER — Other Ambulatory Visit: Payer: Self-pay

## 2024-08-04 ENCOUNTER — Other Ambulatory Visit: Payer: Self-pay

## 2024-08-15 ENCOUNTER — Other Ambulatory Visit: Payer: Self-pay

## 2024-08-24 ENCOUNTER — Other Ambulatory Visit: Payer: Self-pay

## 2024-08-28 ENCOUNTER — Encounter: Payer: Self-pay | Admitting: Physician Assistant

## 2024-08-30 ENCOUNTER — Other Ambulatory Visit: Payer: Self-pay

## 2024-09-18 ENCOUNTER — Other Ambulatory Visit: Payer: Self-pay

## 2024-09-18 DIAGNOSIS — I10 Essential (primary) hypertension: Secondary | ICD-10-CM

## 2024-09-18 MED ORDER — VALSARTAN 160 MG PO TABS
160.0000 mg | ORAL_TABLET | Freq: Every day | ORAL | 3 refills | Status: DC
  Filled 2024-09-18: qty 30, 30d supply, fill #0
  Filled 2024-10-17: qty 30, 30d supply, fill #1
  Filled 2024-11-16: qty 30, 30d supply, fill #2

## 2024-10-04 ENCOUNTER — Other Ambulatory Visit: Payer: Self-pay

## 2024-10-04 ENCOUNTER — Ambulatory Visit

## 2024-10-04 VITALS — BP 156/86 | HR 72 | Temp 98.0°F | Ht 62.0 in | Wt 143.0 lb

## 2024-10-04 DIAGNOSIS — F17210 Nicotine dependence, cigarettes, uncomplicated: Secondary | ICD-10-CM

## 2024-10-04 DIAGNOSIS — K921 Melena: Secondary | ICD-10-CM

## 2024-10-04 DIAGNOSIS — I10 Essential (primary) hypertension: Secondary | ICD-10-CM | POA: Diagnosis not present

## 2024-10-04 DIAGNOSIS — J4540 Moderate persistent asthma, uncomplicated: Secondary | ICD-10-CM

## 2024-10-04 DIAGNOSIS — R1319 Other dysphagia: Secondary | ICD-10-CM

## 2024-10-04 DIAGNOSIS — J8410 Pulmonary fibrosis, unspecified: Secondary | ICD-10-CM | POA: Diagnosis not present

## 2024-10-04 DIAGNOSIS — Z23 Encounter for immunization: Secondary | ICD-10-CM | POA: Diagnosis not present

## 2024-10-04 DIAGNOSIS — Z91120 Patient's intentional underdosing of medication regimen due to financial hardship: Secondary | ICD-10-CM

## 2024-10-04 DIAGNOSIS — H9313 Tinnitus, bilateral: Secondary | ICD-10-CM

## 2024-10-04 DIAGNOSIS — Z79899 Other long term (current) drug therapy: Secondary | ICD-10-CM

## 2024-10-04 DIAGNOSIS — R140 Abdominal distension (gaseous): Secondary | ICD-10-CM

## 2024-10-04 DIAGNOSIS — K219 Gastro-esophageal reflux disease without esophagitis: Secondary | ICD-10-CM

## 2024-10-04 DIAGNOSIS — R52 Pain, unspecified: Secondary | ICD-10-CM

## 2024-10-04 DIAGNOSIS — J069 Acute upper respiratory infection, unspecified: Secondary | ICD-10-CM

## 2024-10-04 DIAGNOSIS — Z7951 Long term (current) use of inhaled steroids: Secondary | ICD-10-CM

## 2024-10-04 MED ORDER — PANTOPRAZOLE SODIUM 40 MG PO TBEC
40.0000 mg | DELAYED_RELEASE_TABLET | Freq: Every day | ORAL | 3 refills | Status: DC
  Filled 2024-10-04 – 2024-10-30 (×5): qty 90, 90d supply, fill #0
  Filled 2024-12-04: qty 90, 90d supply, fill #1

## 2024-10-04 MED ORDER — CETIRIZINE HCL 10 MG PO TABS
10.0000 mg | ORAL_TABLET | Freq: Every day | ORAL | 3 refills | Status: DC
  Filled 2024-10-04 – 2024-10-30 (×5): qty 90, 90d supply, fill #0

## 2024-10-04 MED ORDER — SUCRALFATE 1 G PO TABS
1.0000 g | ORAL_TABLET | Freq: Three times a day (TID) | ORAL | 11 refills | Status: DC
  Filled 2024-10-23: qty 100, 25d supply, fill #0
  Filled 2024-10-23: qty 400, 100d supply, fill #0
  Filled 2024-12-04: qty 100, 25d supply, fill #1

## 2024-10-04 MED ORDER — ALBUTEROL SULFATE HFA 108 (90 BASE) MCG/ACT IN AERS
2.0000 | INHALATION_SPRAY | Freq: Four times a day (QID) | RESPIRATORY_TRACT | 2 refills | Status: DC | PRN
  Filled 2024-10-04: qty 8.5, 25d supply, fill #0
  Filled 2024-11-16: qty 8.5, 25d supply, fill #1
  Filled 2024-12-18: qty 8.5, 25d supply, fill #2

## 2024-10-04 MED ORDER — AMLODIPINE BESYLATE 5 MG PO TABS
5.0000 mg | ORAL_TABLET | Freq: Every day | ORAL | 11 refills | Status: DC
  Filled 2024-10-04: qty 30, 30d supply, fill #0
  Filled 2024-11-06: qty 30, 30d supply, fill #1

## 2024-10-04 MED ORDER — FLUTICASONE PROPIONATE 50 MCG/ACT NA SUSP
2.0000 | Freq: Every day | NASAL | 1 refills | Status: DC
  Filled 2024-10-04: qty 16, 30d supply, fill #0
  Filled 2024-11-16: qty 16, 30d supply, fill #1

## 2024-10-04 MED ORDER — TRELEGY ELLIPTA 100-62.5-25 MCG/ACT IN AEPB: 28.0000 | INHALATION_SPRAY | Freq: Every day | RESPIRATORY_TRACT | Status: DC

## 2024-10-04 NOTE — Patient Instructions (Addendum)
 Today we discussed the following medical conditions and plan:   - I have refilled your Flonase , Zyrtec , pantoprazole , carafate , albuterol .  - added amlodipine  10 mg to your blood pressure regimen  - If you do not get your stomach medicine delivered, please let the clinic know.  - We will give you sample of Trellegy inhaler until you can get your nebulizer. You will use this once a day. DO NOT use your other expensive inhaler.  - I have ordered a nebulizer for you. If it is too expensive, you can buy from [employer]. The medicine that you will use for the nebulizer will be ordered a bit later. Bring your nebulizer machine with you to your next appointment and we can talk about how to use it.  - Today you got your Flu shot   We would like you to come back soon to recheck your cholesterol.   We look forward to seeing you next time. Please call our clinic at [phone] if you have any questions or concerns. The best time to call is Monday-Friday from 9am-4pm, but there is someone available 24/7. If you need medication refills, please notify your pharmacy one week in advance and they will send us  a request.   Thank you for trusting me with your care. Wishing you the best!   Sallyanne Primas, DO  [facility]

## 2024-10-04 NOTE — Progress Notes (Unsigned)
 CC: follow up asthma, abdominal bloating, ringing in ear, meton on neck feels cold. Body aches d/t cold weather   HPI:  [name] is a 63 y.o. male with pertinent past medical history of *** (further medical history stated below) and presents today for ***. Please see problem based assessment and plan for additional details.  Patient returns with the same symptoms of productive cough (yellow sputum), dyspnea worse at night, wheezing. Recently saw Dr. Jolaine a few weeks ago but the patient is unable to explain what he discussed at the last visit. After clarification with the interpreter, the patient is using Albuterol  inhaler twice a day but is not using his LABA/ICS inhaler consistently. Review of past notes reveals that the patient may not be taking another inhaler due to financial concerns. I counseled the patient at length to take his maintenance Symbicort  everyday and Albuterol  inhaler only as needed using the teach-back method.  -Plan:  -continue Albuterol  inhaler as needed with maintenance LABA/ICS (Symbicort ) -will reassess after seeing which inhalers the patient has  Last clinic appointment: 06/26/2024 Last Hospitalization documented: ***  Last Pertinent Labs Documented:     Latest Ref Rng & Units 05/22/2024    3:39 PM 02/11/2024   10:58 AM 09/30/2023    4:01 PM  BMP  Glucose 70 - 99 mg/dL 93  78  91   BUN 8 - 27 mg/dL 7  8  7    Creatinine 0.76 - 1.27 mg/dL 8.97  8.99  8.99   BUN/Creat Ratio 10 - 24 7  8  7    Sodium 134 - 144 mmol/L 141  141  144   Potassium 3.5 - 5.2 mmol/L 4.0  3.9  4.3   Chloride 96 - 106 mmol/L 102  104  104   CO2 20 - 29 mmol/L 21  22  23    Calcium  8.6 - 10.2 mg/dL 9.5  8.8  9.4        Latest Ref Rng & Units 04/25/2024    3:42 PM 02/11/2024   10:58 AM 10/06/2023   11:14 AM  CBC  WBC 4.0 - 10.5 K/uL 8.5  8.7  16.5   Hemoglobin 13.0 - 17.0 g/dL 88.0  88.4  87.7   Hematocrit 39.0 - 52.0 % 36.6  36.8  39.9   Platelets 150.0 - 400.0 K/uL 352.0  345   442     Lab Results  Component Value Date   HGBA1C 4.2 08/22/2020     Past Medical History:  Diagnosis Date   Anemia    CHF (congestive heart failure) (HCC)    Dental infection 08/02/2018   GERD (gastroesophageal reflux disease)    Hypertension    Oral thrush 04/22/2023   Shingles rash 07/07/2019   Thalassemia, alpha    Viral gastritis 02/08/2019    Current Outpatient Medications on File Prior to Visit  Medication Sig Dispense Refill   acetaminophen  (TYLENOL ) 500 MG tablet Take 1,000 mg by mouth every 6 (six) hours as needed for mild pain.     albuterol  (VENTOLIN  HFA) 108 (90 Base) MCG/ACT inhaler Inhale 2 puffs into the lungs every 6 (six) hours as needed for wheezing or shortness of breath. 6.7 g 2   atorvastatin  (LIPITOR ) 80 MG tablet Take 1 tablet (80 mg total) by mouth daily. 30 tablet 11   cetirizine  (ZYRTEC  ALLERGY) 10 MG tablet Take 1 tablet (10 mg total) by mouth daily. 90 tablet 3   DULoxetine  (CYMBALTA ) 60 MG capsule Take 1 capsule (60  mg total) by mouth daily. 30 capsule 11   fluticasone  (FLONASE ) 50 MCG/ACT nasal spray Place 2 sprays into both nostrils daily. 16 g 1   fluticasone -salmeterol (WIXELA INHUB ) 100-50 MCG/ACT AEPB Inhale 1 puff into the lungs 2 (two) times daily. 60 each 3   gabapentin  (NEURONTIN ) 300 MG capsule Take 3 capsules (900 mg total) by mouth 3 (three) times daily as needed. 270 capsule 11   guaiFENesin  (MUCINEX ) 600 MG 12 hr tablet Take 1 tablet (600 mg total) by mouth 2 (two) times daily. 60 tablet 2   guaiFENesin  200 MG tablet Take 2 tablets (400 mg total) by mouth every 6 (six) hours as needed for cough or to loosen phlegm. 30 tablet 0   pantoprazole  (PROTONIX ) 40 MG tablet Take 1 tablet (40 mg total) by mouth daily. 90 tablet 3   polyethylene glycol (MIRALAX ) 17 g packet Take 17 g (1 capful) by mouth daily. 28 packet 5   senna (SENOKOT) 8.6 MG TABS tablet Take 1 tablet (8.6 mg total) by mouth daily as needed for mild constipation. 100 tablet 0    sucralfate  (CARAFATE ) 1 g tablet Take 1 tablet (1 g total) by mouth 4 (four) times daily -  with meals and at bedtime. 120 tablet 11   valsartan  (DIOVAN ) 160 MG tablet Take 1 tablet (160 mg total) by mouth daily. 30 tablet 3   No current facility-administered medications on file prior to visit.    Family History  Problem Relation Age of Onset   Colon cancer Neg Hx    Stomach cancer Neg Hx    Esophageal cancer Neg Hx     Social History   Socioeconomic History   Marital status: Married    Spouse name: Not on file   Number of children: 4   Years of education: Not on file   Highest education level: Not on file  Occupational History   Occupation: Unemployed  Tobacco Use   Smoking status: Former    Current packs/day: 0.00    Average packs/day: 1 pack/day for 5.0 years (5.0 ttl pk-yrs)    Types: Cigarettes    Start date: 12/14/1994    Quit date: 12/15/1999    Years since quitting: 24.8   Smokeless tobacco: Never  Vaping Use   Vaping status: Never Used  Substance and Sexual Activity   Alcohol use: Not Currently    Comment: former   Drug use: Never   Sexual activity: Not on file  Other Topics Concern   Not on file  Social History Narrative   Not on file   Social Drivers of Health   Financial Resource Strain: Not on file  Food Insecurity: No Food Insecurity (01/14/2024)   Hunger Vital Sign    Worried About Running Out of Food in the Last Year: Never true    Ran Out of Food in the Last Year: Never true  Transportation Needs: No Transportation Needs (01/14/2024)   PRAPARE - Administrator, Civil Service (Medical): No    Lack of Transportation (Non-Medical): No  Physical Activity: Not on file  Stress: No Stress Concern Present (01/14/2024)   [employer] of Occupational Health - Occupational Stress Questionnaire    Feeling of Stress : Not at all  Social Connections: Not on file  Intimate Partner Violence: Not At Risk (01/14/2024)   Humiliation, Afraid, Rape,  and Kick questionnaire    Fear of Current or Ex-Partner: No    Emotionally Abused: No    Physically Abused:  No    Sexually Abused: No    Review of Systems: ROS   Vitals:   10/04/24 0916  TempSrc: Oral  Weight: 143 lb (64.9 kg)  Height: 5' 2 (1.575 m)    Physical Exam: Physical Exam   Enlarged, red nasal turbinates. Mouth mouth  Lungs CLEAR heart RRR   FLU SHOT  Laba and steroid. Feels better when he takes it   Assessment & Plan:   Patient {[id number]::discussed with,seen with} Dr. {[id number]::Tpoopjfd,Z. Hoffman,Mullen,Narendra,Vincent,Guilloud,Lau,Machen,Winfrey}  Feels like there is mucus. Taking cetrizine 10 mg (prescribed here). Guanifenesin 400 mg. Has been taking for 2 months takes one pill a day daily. Sometimes cough but just overall feels congested with mucus and sinus congestion. Has had issues with sinus congestion off/on for 4-5 years. Wants a nasal spray. When he does cough, it is productive. Its yellow and light. --> ENT CONSULT?  The inhaler is too expensive -- 100 dollars. Fluticasone  pripinonate and slameterol. Does not use every day. If severe will use it.   MUCINEX  DAILY FOR 2 MONTHS   NEEDs refill on albuterol    Ringing in ear. Right ear. All the time. Has been happening for three years.  No fevers or chills. No en explained weight loss. No CP or SOB. Congestion in the morning is the worst. No runny nose. Whole body has body aches 2 weeks when he gets up in the morning he feels dizzy. 2 weeks ago body felt hot and that's when body aches started. That's also when gas felt the worse. No diarrhea. No nausea or vomiting at that time.   Had constipation for a long time then saw black stool. Sucralfate  helped tremendously. He had EGD 2024 which was negative for H.pylori.  Stomach still feels bloated. Ran out of pantoprazole  40 mg 2 weeks. Also takes sucralfate  --- feels better when he takes it. Has NOT been seeing black in stool  these past 2 weeks.   HTN -- BP 149/87, 150-160. In afternoon: 120-130 but around midnight it goes up again. On valsartan  160 mg ; maybe combo pill? Potassium always looks okay.  Assessment & Plan Postinflammatory pulmonary fibrosis (HCC)  Gastroesophageal reflux disease, unspecified whether esophagitis present  Upper respiratory tract infection, unspecified type  Melena  Esophageal dysphagia  Abdominal bloating  Encounter for vaccination  Moderate persistent asthma, unspecified whether complicated    No orders of the defined types were placed in this encounter.    Sallyanne Primas, D.O. [facility], PGY-1 Date 10/04/2024 Time 9:23 AM

## 2024-10-05 DIAGNOSIS — R52 Pain, unspecified: Secondary | ICD-10-CM | POA: Insufficient documentation

## 2024-10-05 DIAGNOSIS — H9319 Tinnitus, unspecified ear: Secondary | ICD-10-CM | POA: Insufficient documentation

## 2024-10-05 NOTE — Assessment & Plan Note (Signed)
 Chronic. Patient endorsed tinnitus going on about 3 years now. Follow up at next appointment. Can consider ENT referral, however recommendations could include sound distraction which would encourage music or TV in background of every day activities to distract from ringing.  - Follow up at next appointment

## 2024-10-05 NOTE — Assessment & Plan Note (Signed)
 Complains of congestion. Has been taking cetrizine 10 mg and Guanifenesin 400 mg daily for about 2 months. Endorses occasional productive cough and chronic sinus congestion on/off for last 4- 5 years. Denies CP, SOB, fevers, chills, or rhinitis. On physical exam, lungs were clear to auscultation.  Last clinic appointment 06/26/2024 recommended Symbicort  inhaler daily and albuterol  inhaler PRN. Symbicort  inhaler cost the patient around $100 dollars which was unaffordable. Because of this, he has been using the inhaler sparingly. When he uses it, he feels better. Cost is patient's biggest barrier. Will give sample of inhaler today and try to transition to nebulizer for treatment.  - Trellegy sample given, instructed to use daily  - instructed to stop using Symbicort   - Nebulizer ordered, no medications ordered yet.  - encouraged continued use of albuterol  inhaler PRN - Flonase  nasal spray ordered  - Cetirizine  ordered

## 2024-10-05 NOTE — Assessment & Plan Note (Signed)
 Blood pressure today 149/87 and repeat 156/86. Stated at home will run with systolics around 150's as well, occasionally will see 130s. Current BP regimen includes valsartan  160 mg daily. Hydrochlorothiazide combo pill was considered, but in setting of hypertriglyceridemia, will hold off and add alternative medication. - continue valsartan  160 mg daily  - amlodipine  5 mg ordered, instructed to take 10 mg daily.  - consider rechecking lipids at next appointment and depending on results, initiating fenofibrate.  Vitals:   10/04/24 0916 10/04/24 1012  BP: (!) 149/87 (!) 156/86

## 2024-10-05 NOTE — Assessment & Plan Note (Signed)
 Had constipation previously for several years, during this time he noticed black stool. He had EGD 2024 which was negative for H.pylori. Since starting the Pantoprazole  and Sucralfate , he has noticed great improvement in his overall symptoms of bloating, discomfort ,and a resolution of the black stool. He ran out of pantoprazole  40 mg 2 weeks, since then he has noticed increased bloating and abdominal discomfort. Since running out of his medications, he has not noticed the return of black stool.  -refilled pantoprazole  40 mg daily  -refilled carafate  1 g daily

## 2024-10-05 NOTE — Assessment & Plan Note (Signed)
 Denies fevers or chills, unexplained weight loss, CP or SOB, rhinitis. Congestion in the morning is the patient's worst symptom. Endorsed subjective fever 2 weeks ago and since then has had some low-grade body aches, especially in colder temperature, this is around the time that he ran out of his pantoprazole  and carfate so endorsed some stomach bloating. Denies diarrhea, nausea or vomiting at that time and now. Likely post viral syndrome.  -symptomatic treatment

## 2024-10-09 NOTE — Progress Notes (Signed)
 Internal Medicine Clinic Attending  I was physically present during the key portions of the resident provided service and participated in the medical decision making of patient's management care. I reviewed pertinent patient test results.  The assessment, diagnosis, and plan were formulated together and I agree with the documentation in the resident's note.  Francesco Elsie NOVAK, MD

## 2024-10-10 ENCOUNTER — Other Ambulatory Visit: Payer: Self-pay | Admitting: Student

## 2024-10-10 ENCOUNTER — Other Ambulatory Visit: Payer: Self-pay

## 2024-10-10 DIAGNOSIS — R140 Abdominal distension (gaseous): Secondary | ICD-10-CM

## 2024-10-10 DIAGNOSIS — K921 Melena: Secondary | ICD-10-CM

## 2024-10-10 DIAGNOSIS — R1319 Other dysphagia: Secondary | ICD-10-CM

## 2024-10-13 ENCOUNTER — Other Ambulatory Visit: Payer: Self-pay

## 2024-10-17 ENCOUNTER — Other Ambulatory Visit: Payer: Self-pay

## 2024-10-23 ENCOUNTER — Other Ambulatory Visit: Payer: Self-pay

## 2024-10-23 ENCOUNTER — Other Ambulatory Visit (HOSPITAL_COMMUNITY): Payer: Self-pay

## 2024-10-23 ENCOUNTER — Other Ambulatory Visit: Payer: Self-pay | Admitting: Student

## 2024-10-23 ENCOUNTER — Telehealth: Payer: Self-pay | Admitting: *Deleted

## 2024-10-23 DIAGNOSIS — R1319 Other dysphagia: Secondary | ICD-10-CM

## 2024-10-23 DIAGNOSIS — R140 Abdominal distension (gaseous): Secondary | ICD-10-CM

## 2024-10-23 DIAGNOSIS — K921 Melena: Secondary | ICD-10-CM

## 2024-10-23 NOTE — Telephone Encounter (Signed)
 Patient walked in about refills on his Sucralfate , Cetirizine  and Pantoprazole .  Call to [facility]. Patient has refills .  Patient was informed that the Pharmacy is getting his refill ready for pick up in an hour.

## 2024-10-23 NOTE — Telephone Encounter (Signed)
 Medication last refilled 10/04/24 with 11 refills.

## 2024-10-24 ENCOUNTER — Other Ambulatory Visit: Payer: Self-pay

## 2024-10-30 ENCOUNTER — Other Ambulatory Visit: Payer: Self-pay

## 2024-11-06 ENCOUNTER — Other Ambulatory Visit: Payer: Self-pay

## 2024-11-16 ENCOUNTER — Other Ambulatory Visit: Payer: Self-pay

## 2024-12-04 ENCOUNTER — Other Ambulatory Visit: Payer: Self-pay

## 2024-12-04 ENCOUNTER — Ambulatory Visit: Admitting: Student

## 2024-12-04 ENCOUNTER — Encounter: Payer: Self-pay | Admitting: Student

## 2024-12-04 VITALS — BP 134/79 | HR 85 | Temp 98.7°F | Ht 62.0 in | Wt 146.6 lb

## 2024-12-04 DIAGNOSIS — M25512 Pain in left shoulder: Secondary | ICD-10-CM

## 2024-12-04 DIAGNOSIS — G8929 Other chronic pain: Secondary | ICD-10-CM | POA: Diagnosis not present

## 2024-12-04 DIAGNOSIS — I10 Essential (primary) hypertension: Secondary | ICD-10-CM

## 2024-12-04 DIAGNOSIS — K5904 Chronic idiopathic constipation: Secondary | ICD-10-CM | POA: Diagnosis not present

## 2024-12-04 DIAGNOSIS — M25511 Pain in right shoulder: Secondary | ICD-10-CM | POA: Diagnosis not present

## 2024-12-04 DIAGNOSIS — R0981 Nasal congestion: Secondary | ICD-10-CM | POA: Diagnosis not present

## 2024-12-04 MED ORDER — AMLODIPINE BESYLATE 5 MG PO TABS: 10.0000 mg | ORAL_TABLET | Freq: Every day | ORAL | Status: DC

## 2024-12-04 MED ORDER — CETIRIZINE HCL 10 MG PO TABS
10.0000 mg | ORAL_TABLET | Freq: Every day | ORAL | 3 refills | Status: AC
  Filled 2024-12-04: qty 90, 90d supply, fill #0

## 2024-12-04 MED ORDER — FLUTICASONE PROPIONATE 50 MCG/ACT NA SUSP
2.0000 | Freq: Every day | NASAL | 1 refills | Status: AC
  Filled 2024-12-04 – 2024-12-19 (×2): qty 16, 30d supply, fill #0

## 2024-12-04 MED ORDER — VALSARTAN 160 MG PO TABS
160.0000 mg | ORAL_TABLET | Freq: Every day | ORAL | 3 refills | Status: AC
  Filled 2024-12-04 – 2024-12-11 (×3): qty 90, 90d supply, fill #0

## 2024-12-04 NOTE — Progress Notes (Signed)
 Internal Medicine Clinic Attending  Case discussed with the resident at the time of the visit.  We reviewed the resident's history and exam and pertinent patient test results.  I agree with the assessment, diagnosis, and plan of care documented in the resident's note.

## 2024-12-04 NOTE — Assessment & Plan Note (Addendum)
 BP Readings from Last 3 Encounters:  12/04/24 134/79  10/04/24 (!) 156/86  06/26/24 132/85   Chronic, decent control in office today.  Amlodipine  10 mg daily was started at last visit.  He brings empty bottle of this in, and empty bottle of valsartan  160 mg tablets.  The latter he has been out of and has not been taking, but amlodipine  he took this morning.  He says he still has plenty of amlodipine  at home.  Restart valsartan  160 mg daily, refill sent.  Continue amlodipine  10 mg daily.  Orders:   valsartan  (DIOVAN ) 160 MG tablet; Take 1 tablet (160 mg total) by mouth daily.   Basic metabolic panel with GFR

## 2024-12-04 NOTE — Progress Notes (Addendum)
 " Patient name: Eric Ingram Date of birth: 07-Aug-1961 Date of visit: 12/04/2024  Subjective  Reason for visit: Follow-up (1 month), Hypertension, and Asthma ( Pt reports that he has been having a lot of chest congestion .SABRA When asked how long has it been going on he stated a long time )  Discussed the use of AI scribe software for clinical note transcription with the patient, who gave verbal consent to proceed.  History of Present Illness   Eric Ingram is a 63 year old male who presents with shoulder pain exacerbated by cold weather.  Shoulder pain - Persistent bilateral shoulder pain, worsened in cold weather, especially during winter months - History of prior neck surgery - Partial relief with Tylenol  up to 1000 mg four times daily - Additional benefit from over-the-counter lidocaine  patches and home physical therapy exercises - Improvement with duloxetine  for pain - Discontinued gabapentin  due to lack of efficacy - Avoids ibuprofen  due to concern for kidney and blood pressure effects  Gastroesophageal reflux symptoms - Uses pantoprazole  for management  Respiratory symptoms - Uses inhaler at home regularly  Chronic sinus congestion - Chronic sensation of obstruction in one nostril - Improved with daily nasal spray      Outpatient Medications Prior to Visit  Medication Sig   albuterol  (VENTOLIN  HFA) 108 (90 Base) MCG/ACT inhaler Inhale 2 puffs into the lungs every 6 (six) hours as needed for wheezing or shortness of breath.   atorvastatin  (LIPITOR ) 80 MG tablet Take 1 tablet (80 mg total) by mouth daily.   DULoxetine  (CYMBALTA ) 60 MG capsule Take 1 capsule (60 mg total) by mouth daily.   pantoprazole  (PROTONIX ) 40 MG tablet Take 1 tablet (40 mg total) by mouth daily.   [DISCONTINUED] amLODipine  (NORVASC ) 5 MG tablet Take 1 tablet (5 mg total) by mouth daily.   acetaminophen  (TYLENOL ) 500 MG tablet Take 1,000 mg by mouth every 6 (six) hours as needed for mild pain.    fluticasone -salmeterol (WIXELA INHUB ) 100-50 MCG/ACT AEPB Inhale 1 puff into the lungs 2 (two) times daily.   Fluticasone -Umeclidin-Vilant (TRELEGY ELLIPTA ) 100-62.5-25 MCG/ACT AEPB Inhale 28 each into the lungs daily.   polyethylene glycol (MIRALAX ) 17 g packet Take 17 g (1 capful) by mouth daily.   senna (SENOKOT) 8.6 MG TABS tablet Take 1 tablet (8.6 mg total) by mouth daily as needed for mild constipation.   sucralfate  (CARAFATE ) 1 g tablet Take 1 tablet (1 g total) by mouth 4 (four) times daily -  with meals and at bedtime.   [DISCONTINUED] cetirizine  (ZYRTEC  ALLERGY) 10 MG tablet Take 1 tablet (10 mg total) by mouth daily.   [DISCONTINUED] fluticasone  (FLONASE ) 50 MCG/ACT nasal spray Place 2 sprays into both nostrils daily.   [DISCONTINUED] gabapentin  (NEURONTIN ) 300 MG capsule Take 3 capsules (900 mg total) by mouth 3 (three) times daily as needed.   [DISCONTINUED] valsartan  (DIOVAN ) 160 MG tablet Take 1 tablet (160 mg total) by mouth daily.   No facility-administered medications prior to visit.     Objective  Today's Vitals   12/04/24 0925  BP: 134/79  Pulse: 85  Temp: 98.7 F (37.1 C)  TempSrc: Oral  SpO2: 100%  Weight: 146 lb 9.6 oz (66.5 kg)  Height: 5' 2 (1.575 m)  Body mass index is 26.81 kg/m.   Physical Exam Constitutional:      Appearance: Normal appearance.  Cardiovascular:     Rate and Rhythm: Normal rate and regular rhythm.     Pulses: Normal pulses.  Heart sounds: No murmur heard. Pulmonary:     Effort: Pulmonary effort is normal. No respiratory distress.  Abdominal:     General: Abdomen is flat. There is no distension.     Palpations: Abdomen is soft. There is no mass.     Tenderness: There is no abdominal tenderness.  Musculoskeletal:     Comments: Bilateral shoulder tenderness, suprascapular.  Normal range of motion, with some pain on active range of motion.  Skin:    General: Skin is warm and dry.  Neurological:     Mental Status: He is alert.      Cranial Nerves: No facial asymmetry.  Psychiatric:        Mood and Affect: Affect normal.        Speech: Speech normal.        Behavior: Behavior normal.     Assessment & Plan Primary hypertension BP Readings from Last 3 Encounters:  12/04/24 134/79  10/04/24 (!) 156/86  06/26/24 132/85   Chronic, decent control in office today.  Amlodipine  10 mg daily was started at last visit.  He brings empty bottle of this in, and empty bottle of valsartan  160 mg tablets.  The latter he has been out of and has not been taking, but amlodipine  he took this morning.  He says he still has plenty of amlodipine  at home.  Restart valsartan  160 mg daily, refill sent.  Continue amlodipine  10 mg daily.  Orders:   valsartan  (DIOVAN ) 160 MG tablet; Take 1 tablet (160 mg total) by mouth daily.   Basic metabolic panel with GFR   Chronic pain of both shoulders Ongoing for years.  Suprascapular region of both shoulders.  History of adhesive capsulitis noted.  Worse when the weather is cold, does not bother him during the warmer months.  Does not seem like PMR.  He takes daily gabapentin  for this pain and his history of cervical radiculopathy, but it doesn't help, so I'm discontinuing it.  Continue symptomatic care with Tylenol , medicated patches and ointments.  Declines physical therapy because he is doing some home exercises via YouTube, which helped.     Chronic idiopathic constipation With hemorrhoids.  Continue bowel regimen of daily MiraLAX  and senna as needed.  Recommended over-the-counter ointments for hemorrhoids.     Nasal congestion Chronic.  Refilled Flonase .  Could use azelastine if he needs additional relief. Orders:   fluticasone  (FLONASE ) 50 MCG/ACT nasal spray; Place 2 sprays into both nostrils daily.   Medications Discontinued During This Encounter  Medication Reason   gabapentin  (NEURONTIN ) 300 MG capsule    valsartan  (DIOVAN ) 160 MG tablet Reorder   fluticasone  (FLONASE ) 50 MCG/ACT  nasal spray Reorder   cetirizine  (ZYRTEC  ALLERGY) 10 MG tablet Reorder         Return in about 3 months (around 03/04/2025) for chronic condition management, or sooner if needed.  Eric Kung MD 12/04/2024, 11:16 AM      "

## 2024-12-04 NOTE — Assessment & Plan Note (Addendum)
 Ongoing for years.  Suprascapular region of both shoulders.  History of adhesive capsulitis noted.  Worse when the weather is cold, does not bother him during the warmer months.  Does not seem like PMR.  He takes daily gabapentin  for this pain and his history of cervical radiculopathy, but it doesn't help, so I'm discontinuing it.  Continue symptomatic care with Tylenol , medicated patches and ointments.  Declines physical therapy because he is doing some home exercises via YouTube, which helped.

## 2024-12-04 NOTE — Assessment & Plan Note (Addendum)
 With hemorrhoids.  Continue bowel regimen of daily MiraLAX  and senna as needed.  Recommended over-the-counter ointments for hemorrhoids.

## 2024-12-04 NOTE — Patient Instructions (Signed)
 VISIT SUMMARY: You visited us  today for shoulder pain, which worsens in cold weather, and other ongoing health issues. We discussed your current treatments and made some adjustments to help manage your symptoms better.  YOUR PLAN: CHRONIC SHOULDER AND HAND PAIN: You have persistent shoulder pain that gets worse in cold weather. Duloxetine  helps somewhat, but gabapentin  was not effective. -Stop taking gabapentin . -Continue taking duloxetine . -You can take Tylenol  up to 1000 mg, four times a day. -Use over-the-counter lidocaine  patches for your shoulder. -Try using diclofenac  gel (Voltaren  gel) for your achy shoulders. -Keep doing your home exercise videos.  ESSENTIAL HYPERTENSION: Your blood pressure is well-controlled with your current medication. -We provided you with a 90-day supply of your valsartan . -Keep taking your valsartan  and amlodipine .  ALLERGIC RHINITIS: You have nasal congestion, especially in your right nostril. -I refilled your Flonase .  HEMORRHOIDS: You have hemorrhoids causing discomfort. -Use over-the-counter Preparation H. -Take stool softeners to ease your bowel movements and prevent hemorrhoids from getting worse.  Take your prescription medications as usual on the day of your doctor visit. Unless specifically instructed, there is no need to fast prior to laboratory blood testing.  Bring all of the medications that you take (including over the counter medications and supplements) with you to every clinic visit.  This after visit summary is an important review of tests, referrals, and medication changes that were discussed during your visit. If you have questions or concerns, call [phone]. Outside of clinic business hours, call the main hospital at [phone] and ask the operator for the on-call internal medicine resident.   Ozell Kung MD 12/04/2024, 11:07 AM

## 2024-12-05 ENCOUNTER — Ambulatory Visit: Payer: Self-pay | Admitting: Student

## 2024-12-05 DIAGNOSIS — I10 Essential (primary) hypertension: Secondary | ICD-10-CM

## 2024-12-05 LAB — BASIC METABOLIC PANEL WITH GFR
BUN/Creatinine Ratio: 12 (ref 10–24)
BUN: 14 mg/dL (ref 8–27)
CO2: 23 mmol/L (ref 20–29)
Calcium: 9.5 mg/dL (ref 8.6–10.2)
Chloride: 99 mmol/L (ref 96–106)
Creatinine, Ser: 1.15 mg/dL (ref 0.76–1.27)
Glucose: 75 mg/dL (ref 70–99)
Potassium: 4.4 mmol/L (ref 3.5–5.2)
Sodium: 137 mmol/L (ref 134–144)
eGFR: 72 mL/min/1.73

## 2024-12-08 ENCOUNTER — Other Ambulatory Visit: Payer: Self-pay

## 2024-12-11 ENCOUNTER — Other Ambulatory Visit: Payer: Self-pay

## 2024-12-11 MED ORDER — AMLODIPINE BESYLATE 5 MG PO TABS
10.0000 mg | ORAL_TABLET | Freq: Every day | ORAL | 3 refills | Status: AC
  Filled 2024-12-11: qty 90, 45d supply, fill #0

## 2024-12-11 NOTE — Telephone Encounter (Signed)
 Medication discontinued 12/04/24

## 2024-12-18 ENCOUNTER — Ambulatory Visit (INDEPENDENT_AMBULATORY_CARE_PROVIDER_SITE_OTHER): Admitting: Student

## 2024-12-18 ENCOUNTER — Other Ambulatory Visit: Payer: Self-pay

## 2024-12-18 ENCOUNTER — Encounter: Payer: Self-pay | Admitting: Student

## 2024-12-18 VITALS — BP 152/91 | HR 88 | Temp 97.5°F | Ht 62.0 in | Wt 145.6 lb

## 2024-12-18 DIAGNOSIS — J4541 Moderate persistent asthma with (acute) exacerbation: Secondary | ICD-10-CM

## 2024-12-18 DIAGNOSIS — J8410 Pulmonary fibrosis, unspecified: Secondary | ICD-10-CM

## 2024-12-18 LAB — POC SOFIA 2 FLU + SARS ANTIGEN FIA
Influenza A, POC: NEGATIVE
Influenza B, POC: NEGATIVE
SARS Coronavirus 2 Ag: NEGATIVE

## 2024-12-18 MED ORDER — ALBUTEROL SULFATE HFA 108 (90 BASE) MCG/ACT IN AERS: 2.0000 | INHALATION_SPRAY | Freq: Four times a day (QID) | RESPIRATORY_TRACT | 11 refills | Status: AC | PRN

## 2024-12-18 MED ORDER — IPRATROPIUM-ALBUTEROL 0.5-2.5 (3) MG/3ML IN SOLN
3.0000 mL | RESPIRATORY_TRACT | 3 refills | Status: AC | PRN
  Filled 2024-12-18: qty 360, 20d supply, fill #0

## 2024-12-18 MED ORDER — TRELEGY ELLIPTA 100-62.5-25 MCG/ACT IN AEPB
1.0000 | INHALATION_SPRAY | Freq: Every day | RESPIRATORY_TRACT | 3 refills | Status: AC
  Filled 2024-12-18: qty 60, 30d supply, fill #0
  Filled 2024-12-18: qty 60, fill #0

## 2024-12-18 NOTE — Patient Instructions (Signed)
 Thank you, Mr.Jamesmichael Gersten, for allowing us  to provide your care today. Today we discussed . . .  > Asthma exacerbation       - I think you are having an asthma exacerbation but your test for flu and COVID were negative.  It is important that you are able to pick up your maintenance inhaler which is Trelegy.  I will give you a sample today but also I would like you to go to the pharmacy and try to pick it up.  If it is not covered we will work on getting assistance for it.  I have also sent in medicine that will be in a nebulizer machine and we will work on getting this machine sent to you.  It can help with symptoms more than the inhalers can.  The medicine that is in the nebulizer machine you can use every 4 hours for shortness of breath and wheezing.  Please call our clinic or go to the emergency department if your symptoms get any worse.   I have ordered the following labs for you:   Lab Orders         POC SOFIA 2 FLU + SARS ANTIGEN FIA       Referrals ordered today:    Referral Orders         AMB REFERRAL FOR DME       Follow up: 1 month    Remember:  Should you have any questions or concerns please call the internal medicine clinic at [phone].     Fairy Pool, DO [facility]

## 2024-12-18 NOTE — Progress Notes (Signed)
 "  CC: Acute Concern of dyspnea and cough  HPI:  Eric Ingram is a 64 y.o. male with pertinent PMH of HTN, asthma, postinflammatory pulmonary fibrosis, esophageal dysphagia, GERD, and hyperlipidemia who presents as above. Please see assessment and plan below for further details.  Medications: Current Outpatient Medications  Medication Instructions   acetaminophen  (TYLENOL ) 1,000 mg, Every 6 hours PRN   albuterol  (VENTOLIN  HFA) 108 (90 Base) MCG/ACT inhaler 2 puffs, Inhalation, Every 6 hours PRN   amLODipine  (NORVASC ) 10 mg, Oral, Daily   atorvastatin  (LIPITOR ) 80 mg, Oral, Daily   cetirizine  (ZYRTEC  ALLERGY) 10 mg, Oral, Daily   DULoxetine  (CYMBALTA ) 60 mg, Oral, Daily   fluticasone  (FLONASE ) 50 MCG/ACT nasal spray 2 sprays, Each Nare, Daily   fluticasone -salmeterol (WIXELA INHUB ) 100-50 MCG/ACT AEPB 1 puff, Inhalation, 2 times daily   Fluticasone -Umeclidin-Vilant (TRELEGY ELLIPTA ) 100-62.5-25 MCG/ACT AEPB 1 puff, Inhalation, Daily   ipratropium-albuterol  (DUONEB) 0.5-2.5 (3) MG/3ML SOLN 3 mLs, Nebulization, Every 4 hours PRN   pantoprazole  (PROTONIX ) 40 mg, Oral, Daily   polyethylene glycol (MIRALAX ) 17 g packet Take 17 g (1 capful) by mouth daily.   senna (SENOKOT) 8.6 mg, Oral, Daily PRN   sucralfate  (CARAFATE ) 1 g, Oral, 3 times daily with meals & bedtime   valsartan  (DIOVAN ) 160 mg, Oral, Daily     Review of Systems:   Pertinent items noted in HPI and/or A&P.  Physical Exam:  Vitals:   12/18/24 1319 12/18/24 1350  BP: (!) 158/93 (!) 152/91  Pulse: 85 88  Temp: (!) 97.5 F (36.4 C)   TempSrc: Oral   SpO2: 95%   Weight: 145 lb 9.6 oz (66 kg)   Height: 5' 2 (1.575 m)     Constitutional: Chronically ill-appearing elderly male. In no acute distress. HEENT: Normocephalic, atraumatic, Sclera non-icteric, PERRL, EOM intact Cardio:Regular rate and rhythm. 2+ bilateral radial pulses. Pulm: Coarse breath sounds and wheezing diffusely. Normal work of breathing on room  air. FDX:Wzhjupcz for extremity edema. Skin:Warm and dry. Neuro:Alert and oriented x3. No focal deficit noted. Psych:Pleasant mood and affect.   Assessment & Plan:   Assessment & Plan Moderate persistent asthma with acute exacerbation Postinflammatory pulmonary fibrosis (HCC) Presents with 5 days of dyspnea, cough productive of yellow sputum, and subjective fevers.  He has been out of his maintenance inhaler for a few weeks at least and ran out of his rescue albuterol  yesterday.  He is hypertensive but otherwise afebrile, not tachycardic, and satting well on room air.  On exam he has diffuse coarse breath sounds with wheezing but air movement throughout all lung fields.  Rapid flu and COVID test is negative.  Overall symptoms are consistent with a mild exacerbation of his asthma on top of pulmonary fibrosis.  I have given him a sample of Trelegy, refilled his albuterol , and sent DuoNebs and a nebulizer machine.  He is given strict return precautions and will follow-up in 1 month.  Orders Placed This Encounter  Procedures   AMB REFERRAL FOR DME    Referral Priority:   Routine    Referral Type:   Durable Medical Equipment Purchase    Number of Visits Requested:   1   POC SOFIA 2 FLU + SARS ANTIGEN FIA     Return in about 4 weeks (around 01/15/2025) for Follow up asthma, 1 hour appointment.   Patient discussed with Dr. MICAEL Riis Winfrey  Fairy Pool, DO [facility] Resident PGY-3 Clinic Phone: [phone] Please contact the on call pager  at [phone] for any urgent or emergent needs. "

## 2024-12-19 ENCOUNTER — Telehealth: Payer: Self-pay

## 2024-12-19 ENCOUNTER — Other Ambulatory Visit: Payer: Self-pay

## 2024-12-19 ENCOUNTER — Telehealth: Payer: Self-pay | Admitting: *Deleted

## 2024-12-19 ENCOUNTER — Other Ambulatory Visit (HOSPITAL_COMMUNITY): Payer: Self-pay

## 2024-12-19 NOTE — Assessment & Plan Note (Addendum)
 Presents with 5 days of dyspnea, cough productive of yellow sputum, and subjective fevers.  He has been out of his maintenance inhaler for a few weeks at least and ran out of his rescue albuterol  yesterday.  He is hypertensive but otherwise afebrile, not tachycardic, and satting well on room air.  On exam he has diffuse coarse breath sounds with wheezing but air movement throughout all lung fields.  Rapid flu and COVID test is negative.  Overall symptoms are consistent with a mild exacerbation of his asthma on top of pulmonary fibrosis.  I have given him a sample of Trelegy, refilled his albuterol , and sent DuoNebs and a nebulizer machine.  He is given strict return precautions and will follow-up in 1 month.

## 2024-12-19 NOTE — Telephone Encounter (Signed)
 Received message from the pharmacy regarding copay for patients Trelegy inhaler.   Copay is $611 due to high deductible.   Patient has Medicare and [employer] has an assistance program available however to qualify, patient must spend at least $600 out of pocket on medications.   Any suggestions on other inhaler options? I can run test claims and see other programs options.

## 2024-12-19 NOTE — Telephone Encounter (Signed)
 Copied from CRM [id number]. Topic: Clinical - Prescription Issue >> Dec 19, 2024  9:55 AM Suzette B wrote: Reason for CRM: patient stated he has the albuterol  and the nebulizer solution, but he didn't get the nebulizer. He has stated he is not sure dr. Forgot or not but he can't use solution without the machiine. He requested Ms. Cme to call him. Pt does need a Geologist, Engineering

## 2024-12-20 NOTE — Progress Notes (Signed)
 Internal Medicine Clinic Attending  Case discussed with the resident at the time of the visit.  We reviewed the resident's history and exam and pertinent patient test results.  I agree with the assessment, diagnosis, and plan of care documented in the resident's note.

## 2024-12-21 ENCOUNTER — Other Ambulatory Visit: Payer: Self-pay

## 2025-01-19 ENCOUNTER — Other Ambulatory Visit: Payer: Self-pay

## 2025-01-19 ENCOUNTER — Ambulatory Visit: Payer: Self-pay | Admitting: Student

## 2025-01-19 VITALS — BP 143/92 | HR 81 | Temp 98.0°F | Ht 62.0 in | Wt 146.2 lb

## 2025-01-19 DIAGNOSIS — I10 Essential (primary) hypertension: Secondary | ICD-10-CM

## 2025-01-19 DIAGNOSIS — E782 Mixed hyperlipidemia: Secondary | ICD-10-CM

## 2025-01-19 DIAGNOSIS — K219 Gastro-esophageal reflux disease without esophagitis: Secondary | ICD-10-CM

## 2025-01-19 DIAGNOSIS — J4540 Moderate persistent asthma, uncomplicated: Secondary | ICD-10-CM

## 2025-01-19 MED ORDER — SUCRALFATE 1 G PO TABS: 1.0000 g | ORAL_TABLET | Freq: Three times a day (TID) | ORAL | 0 refills | Status: AC

## 2025-01-19 MED ORDER — PANTOPRAZOLE SODIUM 40 MG PO TBEC
40.0000 mg | DELAYED_RELEASE_TABLET | Freq: Every day | ORAL | 3 refills | Status: AC
  Filled 2025-01-19: qty 90, 90d supply, fill #0

## 2025-01-19 MED ORDER — SUCRALFATE 1 G PO TABS
1.0000 g | ORAL_TABLET | Freq: Three times a day (TID) | ORAL | 11 refills | Status: DC
  Filled 2025-01-19: qty 400, 100d supply, fill #0

## 2025-01-19 NOTE — Assessment & Plan Note (Signed)
 Reports that he is out of his Pantoprozole and sulcrafate medications and has symptoms of abdominal bloating and burning sensation in his chest.

## 2025-01-19 NOTE — Patient Instructions (Signed)
 Thank you, [name] for allowing us  to provide your care today. .    For your Asthma, - Continue to use your Wixela inhaler 1 puff in the morning and 1 puff in the evening  - Continue to use the Red inhaler every 6 hours as need for wheezing and shortness of breath.    I have ordered the following labs for you:   Lab Orders         Lipid panel      Referrals ordered today:   Referral Orders  No referral(s) requested today     I have ordered the following medication/changed the following medications:   Stop the following medications: Medications Discontinued During This Encounter  Medication Reason   pantoprazole  (PROTONIX ) 40 MG tablet Reorder   sucralfate  (CARAFATE ) 1 g tablet Reorder   sucralfate  (CARAFATE ) 1 g tablet      Start the following medications: Meds ordered this encounter  Medications   pantoprazole  (PROTONIX ) 40 MG tablet    Sig: Take 1 tablet (40 mg total) by mouth daily.    Dispense:  90 tablet    Refill:  3    IM program   DISCONTD: sucralfate  (CARAFATE ) 1 g tablet    Sig: Take 1 tablet (1 g total) by mouth 4 (four) times daily -  with meals and at bedtime.    Dispense:  400 tablet    Refill:  11   sucralfate  (CARAFATE ) 1 g tablet    Sig: Take 1 tablet (1 g total) by mouth 4 (four) times daily -  with meals and at bedtime.    Dispense:  400 tablet    Refill:  0     Follow up: 3 months Asthma, HTN     Remember: Should you have any questions or concerns please call the internal medicine clinic at [phone].    Dr. T[facility]

## 2025-01-19 NOTE — Assessment & Plan Note (Addendum)
 At the last office visit, patient presented with mild asthma exacerbation, patient was given a sample of Trelegy, refilled albuterol  and DuoNebs with a nebulizer machine.  Upon follow-up today, Patient reports symptoms has improved. Reports that SOB and cough has improved. He is compliant on using Wixela inhaler 1 puff BID and uses Albuterol  PRN. Reports the nebulizer.   - Patient is not eligible for Trelegy; patient will need to spend $600 on drug cost to meet the deductible > which means that he is not eligible for Breo Ellipta and other [employer] inhalers (Trelegy Ellipta , Breo Ellipta, and Anoro Ellipta , along with Advair  Diskus/HFA.).  It seems like he did pick up the Wixela (fluticasone -salmeterol 100-50 ) inhaler > consider medium dose 250-50 mg.

## 2025-01-19 NOTE — Assessment & Plan Note (Signed)
 BP Readings from Last 3 Encounters:  01/19/25 (!) 142/94  12/18/24 (!) 152/91  12/04/24 134/79   Lab Results  Component Value Date   CREATININE 1.15 12/04/2024   CREATININE 1.02 05/22/2024   CREATININE 1.00 02/11/2024    Home medications consist of: amlodipine  5 mg daily and valsartan  160 mg  Denies any symptoms of dizziness, lightheadedness, vision changes, chest pain, shortness of breath, lower extremity swelling  Changes to medications:  Continue:

## 2025-01-19 NOTE — Progress Notes (Unsigned)
" ° °  Established Patient Office Visit  Subjective   Patient ID: Eric Ingram, male    DOB: [birth year]  Age: 64 y.o. MRN: [id number]  Chief Complaint  Patient presents with   Follow-up    Eric Ingram is a 64 y.o. male with past medical history of hypertension, asthma, postinflammatory pulmonary fibrosis, esophageal dysphagia, GERD, hyperlipidemia presenting today for a 1 month follow-up on asthma after last office visit on 12/18/2024.  Review of Systems:  As per assessment and Plan   Objective:     Vitals:   01/19/25 0837  BP: (!) 142/94  Pulse: 86  Temp: 98 F (36.7 C)  TempSrc: Oral  SpO2: 99%  Weight: 146 lb 3.2 oz (66.3 kg)  Height: 5' 2 (1.575 m)    Physical Exam General: Sitting in chair, no acute distress Cardiovascular: Regular rate Pulmonary: Breathing comfortably Abdomen: Soft, nontender, nondistended MSK: No lower extremity edema bilaterally  {Labs (Optional):23779}  The 10-year ASCVD risk score (Arnett DK, et al., 2019) is: 17.1%    Assessment & Plan:   Patient {GC/GE:3044014::discussed with,seen with} Dr. {NAMES:3044014::Chambliss,Chun,Hoffman,Lau,Machen,Narendra,Williams,Winfrey}  Assessment & Plan Moderate persistent asthma, unspecified whether complicated At the last office visit, patient presented with mild asthma exacerbation, patient was given a sample of Trelegy, refilled albuterol  and DuoNebs with a nebulizer machine.  Upon follow-up today, Patient reports symptoms has improved. Reports that SOB and cough has improved. He is compliant on using Wixela inhaler 1 puff BID and uses Albuterol  PRN. Reports the nebulizer.   - Patient is not eligible for Trelegy; patient will need to spend $600 on drug cost to meet the deductible > which means that he is not eligible for Breo Ellipta and other [employer] inhalers (Trelegy Ellipta , Breo Ellipta, and Anoro Ellipta , along with Advair  Diskus/HFA.).  It seems like he did pick up the Wixela  (fluticasone -salmeterol 100-50 ) inhaler > consider medium dose 250-50 mg.  Primary hypertension BP Readings from Last 3 Encounters:  01/19/25 (!) 142/94  12/18/24 (!) 152/91  12/04/24 134/79   Lab Results  Component Value Date   CREATININE 1.15 12/04/2024   CREATININE 1.02 05/22/2024   CREATININE 1.00 02/11/2024    Home medications consist of: amlodipine  5 mg daily and valsartan  160 mg  Denies any symptoms of dizziness, lightheadedness, vision changes, chest pain, shortness of breath, lower extremity swelling  Changes to medications:  Continue:   Mixed hyperlipidemia Lab Results  Component Value Date   CHOL 181 09/30/2023   HDL 36 (L) 09/30/2023   LDLCALC 80 09/30/2023   TRIG 407 (H) 09/30/2023   CHOLHDL 5.0 09/30/2023    The 89-bzjm ASCVD risk score (Arnett DK, et al., 2019) is: 17.1%  - Continue Atorvastatin  80 mg daily.  - F/u lipid panel today    Gastroesophageal reflux disease, unspecified whether esophagitis present Reports that he is out of his Pantoprozole and sulcrafate medications and has symptoms of abdominal bloating and burning sensation in his chest.     Problem List Items Addressed This Visit   None   No follow-ups on file.    Toma Edwards, DO "

## 2025-01-19 NOTE — Assessment & Plan Note (Signed)
 Lab Results  Component Value Date   CHOL 181 09/30/2023   HDL 36 (L) 09/30/2023   LDLCALC 80 09/30/2023   TRIG 407 (H) 09/30/2023   CHOLHDL 5.0 09/30/2023    The 89-bzjm ASCVD risk score (Arnett DK, et al., 2019) is: 17.1%  - Continue Atorvastatin  80 mg daily.  - F/u lipid panel today

## 2025-03-06 ENCOUNTER — Ambulatory Visit: Payer: Self-pay

## 2025-04-20 ENCOUNTER — Ambulatory Visit: Admitting: Student
